# Patient Record
Sex: Female | Born: 1935 | Race: White | Hispanic: No | Marital: Married | Smoking: Never smoker
Health system: Southern US, Community
[De-identification: ages and names within clinical notes are randomized; demographics above are authoritative.]

## PROBLEM LIST (undated history)

## (undated) DIAGNOSIS — M1990 Unspecified osteoarthritis, unspecified site: Secondary | ICD-10-CM

## (undated) DIAGNOSIS — M4800 Spinal stenosis, site unspecified: Secondary | ICD-10-CM

## (undated) DIAGNOSIS — Z860100 Personal history of colon polyps, unspecified: Secondary | ICD-10-CM

## (undated) DIAGNOSIS — E079 Disorder of thyroid, unspecified: Secondary | ICD-10-CM

## (undated) DIAGNOSIS — E785 Hyperlipidemia, unspecified: Secondary | ICD-10-CM

## (undated) DIAGNOSIS — D649 Anemia, unspecified: Secondary | ICD-10-CM

## (undated) DIAGNOSIS — I5032 Chronic diastolic (congestive) heart failure: Secondary | ICD-10-CM

## (undated) DIAGNOSIS — C9001 Multiple myeloma in remission: Secondary | ICD-10-CM

## (undated) DIAGNOSIS — C8800 Waldenstrom macroglobulinemia not having achieved remission: Secondary | ICD-10-CM

## (undated) DIAGNOSIS — K5790 Diverticulosis of intestine, part unspecified, without perforation or abscess without bleeding: Secondary | ICD-10-CM

## (undated) DIAGNOSIS — I10 Essential (primary) hypertension: Secondary | ICD-10-CM

## (undated) DIAGNOSIS — O24919 Unspecified diabetes mellitus in pregnancy, unspecified trimester: Secondary | ICD-10-CM

## (undated) DIAGNOSIS — I509 Heart failure, unspecified: Secondary | ICD-10-CM

## (undated) DIAGNOSIS — K449 Diaphragmatic hernia without obstruction or gangrene: Secondary | ICD-10-CM

## (undated) DIAGNOSIS — K219 Gastro-esophageal reflux disease without esophagitis: Secondary | ICD-10-CM

## (undated) DIAGNOSIS — K760 Fatty (change of) liver, not elsewhere classified: Secondary | ICD-10-CM

## (undated) HISTORY — DX: Fatty (change of) liver, not elsewhere classified: K76.0

## (undated) HISTORY — DX: Unspecified diabetes mellitus in pregnancy, unspecified trimester: O24.919

## (undated) HISTORY — PX: CATARACT EXTRACTION: SUR2

## (undated) HISTORY — DX: Essential (primary) hypertension: I10

## (undated) HISTORY — PX: APPENDECTOMY: SHX54

## (undated) HISTORY — PX: LUMBAR FUSION: SHX111

## (undated) HISTORY — DX: Gastro-esophageal reflux disease without esophagitis: K21.9

## (undated) HISTORY — DX: Waldenstrom macroglobulinemia not having achieved remission: C88.00

## (undated) HISTORY — DX: Disorder of thyroid, unspecified: E07.9

## (undated) HISTORY — DX: Unspecified osteoarthritis, unspecified site: M19.90

## (undated) HISTORY — PX: TOTAL ABDOMINAL HYSTERECTOMY: SHX209

## (undated) HISTORY — PX: KYPHOSIS SURGERY: SHX114

## (undated) HISTORY — DX: Anemia, unspecified: D64.9

## (undated) HISTORY — DX: Personal history of colon polyps, unspecified: Z86.0100

## (undated) HISTORY — DX: Heart failure, unspecified: I50.9

## (undated) HISTORY — DX: Multiple myeloma in remission: C90.01

## (undated) HISTORY — DX: Personal history of colonic polyps: Z86.010

## (undated) HISTORY — DX: Hyperlipidemia, unspecified: E78.5

## (undated) HISTORY — DX: Waldenstrom macroglobulinemia: C88.0

## (undated) HISTORY — PX: COLONOSCOPY W/ POLYPECTOMY: SHX1380

## (undated) HISTORY — DX: Diverticulosis of intestine, part unspecified, without perforation or abscess without bleeding: K57.90

## (undated) HISTORY — PX: TOTAL KNEE ARTHROPLASTY: SHX125

## (undated) HISTORY — DX: Diaphragmatic hernia without obstruction or gangrene: K44.9

## (undated) HISTORY — DX: Spinal stenosis, site unspecified: M48.00

---

## 1991-07-07 ENCOUNTER — Encounter: Payer: Self-pay | Admitting: Gastroenterology

## 1998-09-14 ENCOUNTER — Other Ambulatory Visit: Admission: RE | Admit: 1998-09-14 | Discharge: 1998-09-14 | Payer: Self-pay | Admitting: *Deleted

## 1998-12-15 ENCOUNTER — Ambulatory Visit (HOSPITAL_COMMUNITY): Admission: RE | Admit: 1998-12-15 | Discharge: 1998-12-15 | Payer: Self-pay | Admitting: Orthopedic Surgery

## 1998-12-15 ENCOUNTER — Encounter: Payer: Self-pay | Admitting: Orthopedic Surgery

## 1999-01-03 ENCOUNTER — Encounter: Admission: RE | Admit: 1999-01-03 | Discharge: 1999-04-03 | Payer: Self-pay | Admitting: Anesthesiology

## 1999-05-12 ENCOUNTER — Encounter: Payer: Self-pay | Admitting: Orthopedic Surgery

## 1999-05-20 ENCOUNTER — Inpatient Hospital Stay (HOSPITAL_COMMUNITY): Admission: RE | Admit: 1999-05-20 | Discharge: 1999-05-23 | Payer: Self-pay | Admitting: Orthopedic Surgery

## 1999-05-20 ENCOUNTER — Encounter: Payer: Self-pay | Admitting: Orthopedic Surgery

## 1999-09-22 ENCOUNTER — Other Ambulatory Visit: Admission: RE | Admit: 1999-09-22 | Discharge: 1999-09-22 | Payer: Self-pay | Admitting: *Deleted

## 1999-10-24 ENCOUNTER — Encounter: Admission: RE | Admit: 1999-10-24 | Discharge: 2000-01-22 | Payer: Self-pay | Admitting: *Deleted

## 2000-03-22 ENCOUNTER — Encounter: Admission: RE | Admit: 2000-03-22 | Discharge: 2000-06-20 | Payer: Self-pay | Admitting: *Deleted

## 2000-07-19 ENCOUNTER — Encounter: Admission: RE | Admit: 2000-07-19 | Discharge: 2000-08-14 | Payer: Self-pay | Admitting: Gastroenterology

## 2000-07-25 ENCOUNTER — Encounter: Payer: Self-pay | Admitting: *Deleted

## 2000-07-25 ENCOUNTER — Inpatient Hospital Stay (HOSPITAL_COMMUNITY): Admission: AD | Admit: 2000-07-25 | Discharge: 2000-07-26 | Payer: Self-pay | Admitting: *Deleted

## 2000-09-19 ENCOUNTER — Other Ambulatory Visit: Admission: RE | Admit: 2000-09-19 | Discharge: 2000-09-19 | Payer: Self-pay | Admitting: *Deleted

## 2000-11-15 ENCOUNTER — Encounter: Payer: Self-pay | Admitting: Internal Medicine

## 2000-11-15 ENCOUNTER — Inpatient Hospital Stay (HOSPITAL_COMMUNITY): Admission: EM | Admit: 2000-11-15 | Discharge: 2000-11-21 | Payer: Self-pay | Admitting: Internal Medicine

## 2000-11-16 ENCOUNTER — Encounter: Payer: Self-pay | Admitting: Internal Medicine

## 2000-11-17 ENCOUNTER — Encounter: Payer: Self-pay | Admitting: Internal Medicine

## 2001-04-15 ENCOUNTER — Encounter: Payer: Self-pay | Admitting: Orthopedic Surgery

## 2001-04-18 ENCOUNTER — Inpatient Hospital Stay (HOSPITAL_COMMUNITY): Admission: RE | Admit: 2001-04-18 | Discharge: 2001-04-19 | Payer: Self-pay | Admitting: Orthopedic Surgery

## 2002-06-17 ENCOUNTER — Other Ambulatory Visit: Admission: RE | Admit: 2002-06-17 | Discharge: 2002-06-17 | Payer: Self-pay | Admitting: Obstetrics and Gynecology

## 2002-08-04 ENCOUNTER — Encounter: Payer: Self-pay | Admitting: Gastroenterology

## 2002-08-04 ENCOUNTER — Ambulatory Visit (HOSPITAL_COMMUNITY): Admission: RE | Admit: 2002-08-04 | Discharge: 2002-08-04 | Payer: Self-pay | Admitting: Gastroenterology

## 2002-12-11 ENCOUNTER — Ambulatory Visit (HOSPITAL_COMMUNITY): Admission: RE | Admit: 2002-12-11 | Discharge: 2002-12-11 | Payer: Self-pay | Admitting: Obstetrics and Gynecology

## 2002-12-11 ENCOUNTER — Encounter: Payer: Self-pay | Admitting: Obstetrics and Gynecology

## 2003-05-22 ENCOUNTER — Encounter: Payer: Self-pay | Admitting: Gastroenterology

## 2003-05-22 ENCOUNTER — Ambulatory Visit (HOSPITAL_COMMUNITY): Admission: RE | Admit: 2003-05-22 | Discharge: 2003-05-22 | Payer: Self-pay | Admitting: Gastroenterology

## 2003-07-09 ENCOUNTER — Other Ambulatory Visit: Admission: RE | Admit: 2003-07-09 | Discharge: 2003-07-09 | Payer: Self-pay | Admitting: Obstetrics and Gynecology

## 2003-07-22 ENCOUNTER — Other Ambulatory Visit: Admission: RE | Admit: 2003-07-22 | Discharge: 2003-07-22 | Payer: Self-pay | Admitting: Oncology

## 2003-07-22 ENCOUNTER — Encounter (INDEPENDENT_AMBULATORY_CARE_PROVIDER_SITE_OTHER): Payer: Self-pay | Admitting: *Deleted

## 2003-09-24 ENCOUNTER — Ambulatory Visit (HOSPITAL_COMMUNITY): Admission: RE | Admit: 2003-09-24 | Discharge: 2003-09-24 | Payer: Self-pay | Admitting: Oncology

## 2003-11-25 ENCOUNTER — Encounter: Admission: RE | Admit: 2003-11-25 | Discharge: 2003-11-25 | Payer: Self-pay | Admitting: Gastroenterology

## 2004-01-08 ENCOUNTER — Encounter
Admission: RE | Admit: 2004-01-08 | Discharge: 2004-01-08 | Payer: Self-pay | Admitting: Physical Medicine and Rehabilitation

## 2004-03-08 ENCOUNTER — Encounter: Admission: RE | Admit: 2004-03-08 | Discharge: 2004-03-08 | Payer: Self-pay | Admitting: Obstetrics and Gynecology

## 2004-06-09 ENCOUNTER — Inpatient Hospital Stay (HOSPITAL_COMMUNITY): Admission: RE | Admit: 2004-06-09 | Discharge: 2004-06-13 | Payer: Self-pay | Admitting: Orthopaedic Surgery

## 2004-06-29 ENCOUNTER — Encounter: Admission: RE | Admit: 2004-06-29 | Discharge: 2004-06-29 | Payer: Self-pay | Admitting: Orthopaedic Surgery

## 2004-10-20 ENCOUNTER — Ambulatory Visit: Payer: Self-pay | Admitting: Oncology

## 2004-11-02 ENCOUNTER — Ambulatory Visit: Payer: Self-pay | Admitting: Internal Medicine

## 2005-02-24 ENCOUNTER — Other Ambulatory Visit: Admission: RE | Admit: 2005-02-24 | Discharge: 2005-02-24 | Payer: Self-pay | Admitting: Obstetrics and Gynecology

## 2005-03-23 ENCOUNTER — Ambulatory Visit: Payer: Self-pay | Admitting: Internal Medicine

## 2005-03-24 ENCOUNTER — Ambulatory Visit: Payer: Self-pay | Admitting: Internal Medicine

## 2005-03-31 ENCOUNTER — Ambulatory Visit: Payer: Self-pay

## 2005-04-18 ENCOUNTER — Ambulatory Visit: Payer: Self-pay | Admitting: Oncology

## 2005-04-20 ENCOUNTER — Ambulatory Visit: Payer: Self-pay | Admitting: Internal Medicine

## 2005-05-02 ENCOUNTER — Ambulatory Visit (HOSPITAL_COMMUNITY): Admission: RE | Admit: 2005-05-02 | Discharge: 2005-05-02 | Payer: Self-pay | Admitting: Oncology

## 2005-05-02 ENCOUNTER — Ambulatory Visit: Payer: Self-pay | Admitting: Oncology

## 2005-05-02 ENCOUNTER — Encounter (INDEPENDENT_AMBULATORY_CARE_PROVIDER_SITE_OTHER): Payer: Self-pay | Admitting: *Deleted

## 2005-05-05 ENCOUNTER — Ambulatory Visit: Payer: Self-pay | Admitting: Internal Medicine

## 2005-06-14 ENCOUNTER — Ambulatory Visit: Payer: Self-pay | Admitting: Oncology

## 2005-06-22 ENCOUNTER — Ambulatory Visit: Payer: Self-pay | Admitting: Internal Medicine

## 2005-06-30 ENCOUNTER — Ambulatory Visit: Payer: Self-pay | Admitting: Cardiology

## 2005-06-30 ENCOUNTER — Ambulatory Visit: Payer: Self-pay | Admitting: Family Medicine

## 2005-07-03 ENCOUNTER — Ambulatory Visit: Payer: Self-pay | Admitting: Family Medicine

## 2005-07-03 ENCOUNTER — Encounter: Admission: RE | Admit: 2005-07-03 | Discharge: 2005-07-03 | Payer: Self-pay | Admitting: Family Medicine

## 2005-07-11 ENCOUNTER — Ambulatory Visit (HOSPITAL_COMMUNITY): Admission: RE | Admit: 2005-07-11 | Discharge: 2005-07-11 | Payer: Self-pay | Admitting: Rheumatology

## 2005-07-17 ENCOUNTER — Ambulatory Visit: Payer: Self-pay | Admitting: Internal Medicine

## 2005-07-18 ENCOUNTER — Ambulatory Visit (HOSPITAL_BASED_OUTPATIENT_CLINIC_OR_DEPARTMENT_OTHER): Admission: RE | Admit: 2005-07-18 | Discharge: 2005-07-18 | Payer: Self-pay | Admitting: Internal Medicine

## 2005-07-27 ENCOUNTER — Ambulatory Visit: Payer: Self-pay | Admitting: Pulmonary Disease

## 2005-08-04 ENCOUNTER — Ambulatory Visit: Payer: Self-pay | Admitting: Family Medicine

## 2005-08-09 ENCOUNTER — Encounter: Admission: RE | Admit: 2005-08-09 | Discharge: 2005-08-09 | Payer: Self-pay | Admitting: Orthopaedic Surgery

## 2005-08-10 ENCOUNTER — Ambulatory Visit: Payer: Self-pay | Admitting: Pulmonary Disease

## 2005-08-11 ENCOUNTER — Ambulatory Visit: Payer: Self-pay | Admitting: Oncology

## 2005-08-16 ENCOUNTER — Ambulatory Visit (HOSPITAL_COMMUNITY): Admission: RE | Admit: 2005-08-16 | Discharge: 2005-08-17 | Payer: Self-pay | Admitting: Orthopaedic Surgery

## 2005-08-16 ENCOUNTER — Encounter (INDEPENDENT_AMBULATORY_CARE_PROVIDER_SITE_OTHER): Payer: Self-pay | Admitting: Specialist

## 2005-08-24 ENCOUNTER — Ambulatory Visit: Payer: Self-pay | Admitting: Internal Medicine

## 2005-09-11 ENCOUNTER — Encounter: Admission: RE | Admit: 2005-09-11 | Discharge: 2005-09-11 | Payer: Self-pay | Admitting: Oncology

## 2005-10-12 ENCOUNTER — Ambulatory Visit: Payer: Self-pay | Admitting: Oncology

## 2005-10-17 ENCOUNTER — Ambulatory Visit: Payer: Self-pay | Admitting: Internal Medicine

## 2005-11-08 ENCOUNTER — Ambulatory Visit: Payer: Self-pay | Admitting: Internal Medicine

## 2005-11-10 ENCOUNTER — Ambulatory Visit: Payer: Self-pay | Admitting: Cardiology

## 2005-11-10 ENCOUNTER — Inpatient Hospital Stay (HOSPITAL_COMMUNITY): Admission: EM | Admit: 2005-11-10 | Discharge: 2005-11-15 | Payer: Self-pay | Admitting: Emergency Medicine

## 2005-11-20 ENCOUNTER — Ambulatory Visit: Payer: Self-pay

## 2005-11-27 ENCOUNTER — Ambulatory Visit: Payer: Self-pay | Admitting: Cardiology

## 2005-11-28 ENCOUNTER — Ambulatory Visit: Payer: Self-pay | Admitting: Internal Medicine

## 2005-11-29 ENCOUNTER — Ambulatory Visit: Payer: Self-pay | Admitting: Internal Medicine

## 2005-11-30 ENCOUNTER — Ambulatory Visit: Payer: Self-pay | Admitting: Pulmonary Disease

## 2005-12-01 ENCOUNTER — Ambulatory Visit: Payer: Self-pay | Admitting: Oncology

## 2005-12-06 ENCOUNTER — Ambulatory Visit (HOSPITAL_COMMUNITY): Admission: RE | Admit: 2005-12-06 | Discharge: 2005-12-06 | Payer: Self-pay | Admitting: Pulmonary Disease

## 2005-12-06 ENCOUNTER — Ambulatory Visit: Payer: Self-pay | Admitting: Pulmonary Disease

## 2005-12-12 ENCOUNTER — Ambulatory Visit: Payer: Self-pay | Admitting: Pulmonary Disease

## 2005-12-26 ENCOUNTER — Encounter: Admission: RE | Admit: 2005-12-26 | Discharge: 2006-03-26 | Payer: Self-pay | Admitting: Internal Medicine

## 2006-01-09 ENCOUNTER — Ambulatory Visit: Payer: Self-pay | Admitting: Internal Medicine

## 2006-01-15 ENCOUNTER — Ambulatory Visit: Payer: Self-pay | Admitting: Pulmonary Disease

## 2006-01-23 ENCOUNTER — Ambulatory Visit: Payer: Self-pay | Admitting: Cardiology

## 2006-01-29 ENCOUNTER — Ambulatory Visit: Payer: Self-pay | Admitting: Oncology

## 2006-01-29 LAB — PROTIME-INR: INR: 1 — ABNORMAL LOW (ref 2.00–3.50)

## 2006-01-29 LAB — CBC WITH DIFFERENTIAL/PLATELET
BASO%: 0.2 % (ref 0.0–2.0)
EOS%: 6.7 % (ref 0.0–7.0)
MCHC: 34.5 g/dL (ref 32.0–36.0)
MONO#: 0.4 10*3/uL (ref 0.1–0.9)
RBC: 3.1 10*6/uL — ABNORMAL LOW (ref 3.70–5.32)
WBC: 5.2 10*3/uL (ref 3.9–10.0)
lymph#: 2.1 10*3/uL (ref 0.9–3.3)

## 2006-02-01 LAB — COMPREHENSIVE METABOLIC PANEL
ALT: 12 U/L (ref 0–40)
AST: 18 U/L (ref 0–37)
CO2: 29 mEq/L (ref 19–32)
Calcium: 10 mg/dL (ref 8.4–10.5)
Chloride: 99 mEq/L (ref 96–112)
Sodium: 141 mEq/L (ref 135–145)
Total Bilirubin: 0.4 mg/dL (ref 0.3–1.2)
Total Protein: 8.3 g/dL (ref 6.0–8.3)

## 2006-02-01 LAB — PROTEIN ELECTROPHORESIS, SERUM
Beta Globulin: 4.8 % (ref 4.7–7.2)
Gamma Globulin: 27.6 % — ABNORMAL HIGH (ref 11.1–18.8)
M-Spike, %: 1.77 g/dL
Total Protein, Serum Electrophoresis: 8.3 g/dL (ref 6.0–8.3)

## 2006-02-01 LAB — KAPPA/LAMBDA LIGHT CHAINS: Lambda Free Lght Chn: 1.07 mg/dL (ref 0.57–2.63)

## 2006-02-09 ENCOUNTER — Ambulatory Visit: Payer: Self-pay | Admitting: Internal Medicine

## 2006-02-09 ENCOUNTER — Encounter: Admission: RE | Admit: 2006-02-09 | Discharge: 2006-02-09 | Payer: Self-pay | Admitting: Orthopedic Surgery

## 2006-02-23 ENCOUNTER — Inpatient Hospital Stay (HOSPITAL_COMMUNITY): Admission: RE | Admit: 2006-02-23 | Discharge: 2006-02-26 | Payer: Self-pay | Admitting: Orthopedic Surgery

## 2006-03-06 LAB — CBC WITH DIFFERENTIAL/PLATELET
Basophils Absolute: 0 10*3/uL (ref 0.0–0.1)
EOS%: 4.7 % (ref 0.0–7.0)
Eosinophils Absolute: 0.3 10*3/uL (ref 0.0–0.5)
HGB: 11.1 g/dL — ABNORMAL LOW (ref 11.6–15.9)
NEUT#: 3.2 10*3/uL (ref 1.5–6.5)
RBC: 3.43 10*6/uL — ABNORMAL LOW (ref 3.70–5.32)
RDW: 13.5 % (ref 11.3–14.5)
lymph#: 2.9 10*3/uL (ref 0.9–3.3)

## 2006-03-08 LAB — COMPREHENSIVE METABOLIC PANEL
AST: 36 U/L (ref 0–37)
Albumin: 4.1 g/dL (ref 3.5–5.2)
BUN: 24 mg/dL — ABNORMAL HIGH (ref 6–23)
Calcium: 9.6 mg/dL (ref 8.4–10.5)
Chloride: 96 mEq/L (ref 96–112)
Glucose, Bld: 75 mg/dL (ref 70–99)
Potassium: 4.4 mEq/L (ref 3.5–5.3)
Sodium: 141 mEq/L (ref 135–145)
Total Protein: 8.8 g/dL — ABNORMAL HIGH (ref 6.0–8.3)

## 2006-03-08 LAB — PROTEIN ELECTROPHORESIS, SERUM
Gamma Globulin: 28.5 % — ABNORMAL HIGH (ref 11.1–18.8)
M-Spike, %: 1.86 g/dL

## 2006-03-09 ENCOUNTER — Ambulatory Visit: Payer: Self-pay | Admitting: Internal Medicine

## 2006-03-16 ENCOUNTER — Ambulatory Visit: Payer: Self-pay | Admitting: Oncology

## 2006-03-20 LAB — CBC WITH DIFFERENTIAL/PLATELET
Eosinophils Absolute: 0.3 10*3/uL (ref 0.0–0.5)
LYMPH%: 42 % (ref 14.0–48.0)
MONO#: 0.6 10*3/uL (ref 0.1–0.9)
NEUT#: 2.2 10*3/uL (ref 1.5–6.5)
Platelets: 221 10*3/uL (ref 145–400)
RBC: 3.21 10*6/uL — ABNORMAL LOW (ref 3.70–5.32)
RDW: 14 % (ref 11.3–14.5)
WBC: 5.3 10*3/uL (ref 3.9–10.0)

## 2006-03-21 ENCOUNTER — Ambulatory Visit: Payer: Self-pay | Admitting: Internal Medicine

## 2006-03-21 LAB — PROTEIN ELECTROPHORESIS, SERUM
Albumin ELP: 48.3 % — ABNORMAL LOW (ref 55.8–66.1)
Total Protein, Serum Electrophoresis: 8.4 g/dL — ABNORMAL HIGH (ref 6.0–8.3)

## 2006-03-21 LAB — COMPREHENSIVE METABOLIC PANEL
Albumin: 4.1 g/dL (ref 3.5–5.2)
CO2: 31 mEq/L (ref 19–32)
Calcium: 9.8 mg/dL (ref 8.4–10.5)
Chloride: 98 mEq/L (ref 96–112)
Glucose, Bld: 111 mg/dL — ABNORMAL HIGH (ref 70–99)
Potassium: 4 mEq/L (ref 3.5–5.3)
Sodium: 140 mEq/L (ref 135–145)
Total Protein: 8.4 g/dL — ABNORMAL HIGH (ref 6.0–8.3)

## 2006-03-26 ENCOUNTER — Ambulatory Visit: Payer: Self-pay | Admitting: Internal Medicine

## 2006-04-02 ENCOUNTER — Ambulatory Visit: Payer: Self-pay | Admitting: Cardiology

## 2006-04-17 LAB — CBC WITH DIFFERENTIAL/PLATELET
Eosinophils Absolute: 0.2 10*3/uL (ref 0.0–0.5)
MONO#: 0.4 10*3/uL (ref 0.1–0.9)
NEUT#: 2.2 10*3/uL (ref 1.5–6.5)
RBC: 3.27 10*6/uL — ABNORMAL LOW (ref 3.70–5.32)
RDW: 13.5 % (ref 11.3–14.5)
WBC: 5 10*3/uL (ref 3.9–10.0)
lymph#: 2.2 10*3/uL (ref 0.9–3.3)

## 2006-04-29 ENCOUNTER — Ambulatory Visit: Payer: Self-pay | Admitting: Oncology

## 2006-05-11 LAB — CBC WITH DIFFERENTIAL/PLATELET
Eosinophils Absolute: 0.2 10*3/uL (ref 0.0–0.5)
HCT: 32.3 % — ABNORMAL LOW (ref 34.8–46.6)
LYMPH%: 38.1 % (ref 14.0–48.0)
MONO#: 0.4 10*3/uL (ref 0.1–0.9)
NEUT#: 1.9 10*3/uL (ref 1.5–6.5)
NEUT%: 47.3 % (ref 39.6–76.8)
Platelets: 192 10*3/uL (ref 145–400)
WBC: 3.9 10*3/uL (ref 3.9–10.0)

## 2006-05-24 LAB — CBC WITH DIFFERENTIAL/PLATELET
BASO%: 0.9 % (ref 0.0–2.0)
EOS%: 4.4 % (ref 0.0–7.0)
HCT: 32.1 % — ABNORMAL LOW (ref 34.8–46.6)
LYMPH%: 46.7 % (ref 14.0–48.0)
MCH: 32.7 pg (ref 26.0–34.0)
MCHC: 34.5 g/dL (ref 32.0–36.0)
NEUT%: 38.6 % — ABNORMAL LOW (ref 39.6–76.8)
Platelets: 191 10*3/uL (ref 145–400)

## 2006-05-29 ENCOUNTER — Ambulatory Visit: Payer: Self-pay | Admitting: Internal Medicine

## 2006-06-05 ENCOUNTER — Ambulatory Visit: Payer: Self-pay | Admitting: Internal Medicine

## 2006-06-07 LAB — CBC WITH DIFFERENTIAL/PLATELET
BASO%: 0.1 % (ref 0.0–2.0)
EOS%: 4.3 % (ref 0.0–7.0)
HCT: 29.6 % — ABNORMAL LOW (ref 34.8–46.6)
MCH: 33.3 pg (ref 26.0–34.0)
MCHC: 35 g/dL (ref 32.0–36.0)
MONO%: 10.7 % (ref 0.0–13.0)
NEUT%: 51.9 % (ref 39.6–76.8)
RDW: 13.1 % (ref 11.3–14.5)
lymph#: 1.7 10*3/uL (ref 0.9–3.3)

## 2006-06-08 ENCOUNTER — Ambulatory Visit: Payer: Self-pay | Admitting: Family Medicine

## 2006-06-12 LAB — COMPREHENSIVE METABOLIC PANEL
ALT: 15 U/L (ref 0–40)
AST: 17 U/L (ref 0–37)
Alkaline Phosphatase: 45 U/L (ref 39–117)
Creatinine, Ser: 0.99 mg/dL (ref 0.40–1.20)
Sodium: 143 mEq/L (ref 135–145)
Total Bilirubin: 0.4 mg/dL (ref 0.3–1.2)
Total Protein: 8.2 g/dL (ref 6.0–8.3)

## 2006-06-12 LAB — PROTEIN ELECTROPHORESIS, SERUM
Alpha-2-Globulin: 10.6 % (ref 7.1–11.8)
Beta 2: 3.4 % (ref 3.2–6.5)
Beta Globulin: 4.8 % (ref 4.7–7.2)
Gamma Globulin: 26.3 % — ABNORMAL HIGH (ref 11.1–18.8)
M-Spike, %: 1.47 g/dL
Total Protein, Serum Electrophoresis: 8.2 g/dL (ref 6.0–8.3)

## 2006-06-14 ENCOUNTER — Ambulatory Visit: Payer: Self-pay | Admitting: Family Medicine

## 2006-06-27 ENCOUNTER — Ambulatory Visit: Payer: Self-pay | Admitting: Internal Medicine

## 2006-06-29 ENCOUNTER — Ambulatory Visit: Payer: Self-pay | Admitting: Oncology

## 2006-07-03 LAB — CBC WITH DIFFERENTIAL/PLATELET
EOS%: 0.8 % (ref 0.0–7.0)
Eosinophils Absolute: 0 10*3/uL (ref 0.0–0.5)
LYMPH%: 32.8 % (ref 14.0–48.0)
MCH: 33.5 pg (ref 26.0–34.0)
MCV: 96.1 fL (ref 81.0–101.0)
MONO%: 7.5 % (ref 0.0–13.0)
NEUT#: 3.2 10*3/uL (ref 1.5–6.5)
Platelets: 174 10*3/uL (ref 145–400)
RBC: 3.07 10*6/uL — ABNORMAL LOW (ref 3.70–5.32)

## 2006-07-06 LAB — PROTEIN ELECTROPHORESIS, SERUM
Albumin ELP: 51 % — ABNORMAL LOW (ref 55.8–66.1)
M-Spike, %: 1.64 g/dL
Total Protein, Serum Electrophoresis: 8.3 g/dL (ref 6.0–8.3)

## 2006-07-06 LAB — BASIC METABOLIC PANEL
BUN: 32 mg/dL — ABNORMAL HIGH (ref 6–23)
Calcium: 10 mg/dL (ref 8.4–10.5)
Glucose, Bld: 122 mg/dL — ABNORMAL HIGH (ref 70–99)
Sodium: 144 mEq/L (ref 135–145)

## 2006-07-25 ENCOUNTER — Ambulatory Visit: Payer: Self-pay | Admitting: Internal Medicine

## 2006-07-25 LAB — CONVERTED CEMR LAB: Creatinine,U: 27.8 mg/dL

## 2006-08-01 ENCOUNTER — Ambulatory Visit: Payer: Self-pay | Admitting: Internal Medicine

## 2006-08-21 ENCOUNTER — Ambulatory Visit: Payer: Self-pay | Admitting: Oncology

## 2006-08-23 LAB — CBC WITH DIFFERENTIAL/PLATELET
Basophils Absolute: 0 10*3/uL (ref 0.0–0.1)
Eosinophils Absolute: 0.1 10*3/uL (ref 0.0–0.5)
HGB: 9.6 g/dL — ABNORMAL LOW (ref 11.6–15.9)
MCV: 97.1 fL (ref 81.0–101.0)
MONO#: 0.5 10*3/uL (ref 0.1–0.9)
MONO%: 10.4 % (ref 0.0–13.0)
NEUT#: 2.2 10*3/uL (ref 1.5–6.5)
Platelets: 176 10*3/uL (ref 145–400)
RDW: 12.7 % (ref 11.3–14.5)
WBC: 4.5 10*3/uL (ref 3.9–10.0)

## 2006-08-24 LAB — IGG, IGA, IGM
IgA: 34 mg/dL — ABNORMAL LOW (ref 68–378)
IgM, Serum: 2650 mg/dL — ABNORMAL HIGH (ref 60–263)

## 2006-08-24 LAB — PROTEIN ELECTROPHORESIS, SERUM
Albumin ELP: 48.9 % — ABNORMAL LOW (ref 55.8–66.1)
Alpha-2-Globulin: 11.9 % — ABNORMAL HIGH (ref 7.1–11.8)
Beta 2: 3.9 % (ref 3.2–6.5)
Beta Globulin: 4.7 % (ref 4.7–7.2)
Total Protein, Serum Electrophoresis: 8.5 g/dL — ABNORMAL HIGH (ref 6.0–8.3)

## 2006-09-07 ENCOUNTER — Inpatient Hospital Stay (HOSPITAL_COMMUNITY): Admission: RE | Admit: 2006-09-07 | Discharge: 2006-09-09 | Payer: Self-pay | Admitting: Orthopedic Surgery

## 2006-09-27 LAB — CBC WITH DIFFERENTIAL/PLATELET
Basophils Absolute: 0 10*3/uL (ref 0.0–0.1)
Eosinophils Absolute: 0.1 10*3/uL (ref 0.0–0.5)
HGB: 9.3 g/dL — ABNORMAL LOW (ref 11.6–15.9)
NEUT#: 3.5 10*3/uL (ref 1.5–6.5)
RBC: 2.79 10*6/uL — ABNORMAL LOW (ref 3.70–5.32)
RDW: 13 % (ref 11.3–14.5)
WBC: 5.9 10*3/uL (ref 3.9–10.0)
lymph#: 1.6 10*3/uL (ref 0.9–3.3)

## 2006-10-15 ENCOUNTER — Ambulatory Visit: Payer: Self-pay | Admitting: Oncology

## 2006-10-18 LAB — CBC WITH DIFFERENTIAL/PLATELET
BASO%: 0.3 % (ref 0.0–2.0)
Basophils Absolute: 0 10*3/uL (ref 0.0–0.1)
EOS%: 1 % (ref 0.0–7.0)
HGB: 9.9 g/dL — ABNORMAL LOW (ref 11.6–15.9)
MCH: 33.5 pg (ref 26.0–34.0)
RBC: 2.95 10*6/uL — ABNORMAL LOW (ref 3.70–5.32)
RDW: 12.9 % (ref 11.3–14.5)
lymph#: 2 10*3/uL (ref 0.9–3.3)

## 2006-10-18 LAB — COMPREHENSIVE METABOLIC PANEL
ALT: 14 U/L (ref 0–35)
AST: 14 U/L (ref 0–37)
Albumin: 4.1 g/dL (ref 3.5–5.2)
Alkaline Phosphatase: 44 U/L (ref 39–117)
Calcium: 9.8 mg/dL (ref 8.4–10.5)
Chloride: 100 mEq/L (ref 96–112)
Potassium: 4 mEq/L (ref 3.5–5.3)
Sodium: 144 mEq/L (ref 135–145)
Total Protein: 8.3 g/dL (ref 6.0–8.3)

## 2006-10-22 LAB — UIFE/LIGHT CHAINS/TP QN, 24-HR UR
Albumin, U: DETECTED
Free Kappa Lt Chains,Ur: 1.41 mg/dL (ref 0.04–1.51)
Gamma Globulin, Urine: DETECTED — AB

## 2006-10-22 LAB — CREATININE CLEARANCE, URINE, 24 HOUR
Collection Interval-CRCL: 24 hours
Creatinine Clearance: 67 mL/min — ABNORMAL LOW (ref 75–115)
Creatinine, 24H Ur: 1041 mg/d (ref 700–1800)
Creatinine: 1.08 mg/dL (ref 0.40–1.20)
Urine Total Volume-CRCL: 1300 mL

## 2006-10-29 DIAGNOSIS — J45909 Unspecified asthma, uncomplicated: Secondary | ICD-10-CM | POA: Insufficient documentation

## 2006-10-29 DIAGNOSIS — M4800 Spinal stenosis, site unspecified: Secondary | ICD-10-CM

## 2006-10-29 DIAGNOSIS — M810 Age-related osteoporosis without current pathological fracture: Secondary | ICD-10-CM | POA: Insufficient documentation

## 2006-10-29 DIAGNOSIS — C9001 Multiple myeloma in remission: Secondary | ICD-10-CM

## 2006-10-29 DIAGNOSIS — D649 Anemia, unspecified: Secondary | ICD-10-CM | POA: Insufficient documentation

## 2006-10-29 DIAGNOSIS — Z860100 Personal history of colon polyps, unspecified: Secondary | ICD-10-CM | POA: Insufficient documentation

## 2006-11-14 ENCOUNTER — Ambulatory Visit: Payer: Self-pay | Admitting: Internal Medicine

## 2006-11-14 LAB — CONVERTED CEMR LAB
HDL: 51 mg/dL (ref >39.0)
LDL Cholesterol: 95 mg/dL (ref 0–99)
Triglycerides: 184 mg/dL — ABNORMAL HIGH (ref 0–149)
VLDL: 37 mg/dL (ref 0–40)

## 2006-11-21 ENCOUNTER — Ambulatory Visit: Payer: Self-pay | Admitting: Internal Medicine

## 2006-11-30 ENCOUNTER — Ambulatory Visit: Payer: Self-pay | Admitting: Pulmonary Disease

## 2006-11-30 LAB — CONVERTED CEMR LAB
Eosinophils Absolute: 0.2 10*3/uL (ref 0.0–0.6)
Eosinophils Relative: 2.8 % (ref 0.0–5.0)
Lymphocytes Relative: 40.5 % (ref 12.0–46.0)
MCV: 95 fL (ref 78.0–100.0)
Monocytes Relative: 9.6 % (ref 3.0–11.0)
Neutro Abs: 2.7 10*3/uL (ref 1.4–7.7)
Platelets: 186 10*3/uL (ref 150–400)
WBC: 5.7 10*3/uL (ref 4.5–10.5)

## 2006-12-03 ENCOUNTER — Ambulatory Visit: Payer: Self-pay | Admitting: Oncology

## 2006-12-06 LAB — CBC WITH DIFFERENTIAL/PLATELET
Basophils Absolute: 0 10*3/uL (ref 0.0–0.1)
Eosinophils Absolute: 0.1 10*3/uL (ref 0.0–0.5)
HGB: 9.7 g/dL — ABNORMAL LOW (ref 11.6–15.9)
LYMPH%: 41.4 % (ref 14.0–48.0)
MCV: 94.5 fL (ref 81.0–101.0)
MONO#: 0.5 10*3/uL (ref 0.1–0.9)
MONO%: 9.2 % (ref 0.0–13.0)
NEUT#: 2.6 10*3/uL (ref 1.5–6.5)
Platelets: 176 10*3/uL (ref 145–400)
RDW: 13.2 % (ref 11.3–14.5)
WBC: 5.6 10*3/uL (ref 3.9–10.0)

## 2006-12-10 LAB — COMPREHENSIVE METABOLIC PANEL
Albumin: 4.1 g/dL (ref 3.5–5.2)
Alkaline Phosphatase: 43 U/L (ref 39–117)
BUN: 36 mg/dL — ABNORMAL HIGH (ref 6–23)
CO2: 28 mEq/L (ref 19–32)
Calcium: 10 mg/dL (ref 8.4–10.5)
Glucose, Bld: 136 mg/dL — ABNORMAL HIGH (ref 70–99)
Potassium: 3.6 mEq/L (ref 3.5–5.3)
Total Protein: 8.7 g/dL — ABNORMAL HIGH (ref 6.0–8.3)

## 2006-12-10 LAB — PROTEIN ELECTROPHORESIS, SERUM
Albumin ELP: 46.5 % — ABNORMAL LOW (ref 55.8–66.1)
Alpha-1-Globulin: 3.6 % (ref 2.9–4.9)
Beta 2: 3.8 % (ref 3.2–6.5)
Total Protein, Serum Electrophoresis: 8.7 g/dL — ABNORMAL HIGH (ref 6.0–8.3)

## 2006-12-11 ENCOUNTER — Ambulatory Visit: Admission: RE | Admit: 2006-12-11 | Discharge: 2006-12-11 | Payer: Self-pay | Admitting: Pulmonary Disease

## 2006-12-11 ENCOUNTER — Ambulatory Visit: Payer: Self-pay | Admitting: Pulmonary Disease

## 2006-12-20 ENCOUNTER — Ambulatory Visit: Payer: Self-pay | Admitting: Pulmonary Disease

## 2006-12-20 ENCOUNTER — Ambulatory Visit (HOSPITAL_COMMUNITY): Admission: RE | Admit: 2006-12-20 | Discharge: 2006-12-20 | Payer: Self-pay | Admitting: Oncology

## 2006-12-21 LAB — PROTHROMBIN TIME
INR: 1.1 (ref 0.0–1.5)
Prothrombin Time: 14.1 seconds (ref 11.6–15.2)

## 2006-12-21 LAB — APTT: aPTT: 32 seconds (ref 24–37)

## 2006-12-26 LAB — COMPREHENSIVE METABOLIC PANEL
ALT: 13 U/L (ref 0–35)
AST: 15 U/L (ref 0–37)
Alkaline Phosphatase: 47 U/L (ref 39–117)
Creatinine, Ser: 1.11 mg/dL (ref 0.40–1.20)
Sodium: 140 mEq/L (ref 135–145)
Total Bilirubin: 0.5 mg/dL (ref 0.3–1.2)
Total Protein: 9.2 g/dL — ABNORMAL HIGH (ref 6.0–8.3)

## 2006-12-26 LAB — VISCOSITY, SERUM: Viscosity, Serum: 2 mPa.S (ref 1.1–2.0)

## 2006-12-27 ENCOUNTER — Ambulatory Visit: Payer: Self-pay | Admitting: Family Medicine

## 2007-01-04 ENCOUNTER — Ambulatory Visit: Payer: Self-pay | Admitting: Internal Medicine

## 2007-01-04 LAB — CBC WITH DIFFERENTIAL/PLATELET
BASO%: 0.5 % (ref 0.0–2.0)
HCT: 29.3 % — ABNORMAL LOW (ref 34.8–46.6)
MCHC: 34.7 g/dL (ref 32.0–36.0)
MONO#: 0.9 10*3/uL (ref 0.1–0.9)
RBC: 3.12 10*6/uL — ABNORMAL LOW (ref 3.70–5.32)
WBC: 6.5 10*3/uL (ref 3.9–10.0)
lymph#: 4 10*3/uL — ABNORMAL HIGH (ref 0.9–3.3)

## 2007-01-09 ENCOUNTER — Ambulatory Visit: Payer: Self-pay | Admitting: Pulmonary Disease

## 2007-01-15 ENCOUNTER — Ambulatory Visit: Payer: Self-pay | Admitting: Oncology

## 2007-01-18 LAB — CBC WITH DIFFERENTIAL/PLATELET
Basophils Absolute: 0.1 10*3/uL (ref 0.0–0.1)
HCT: 30.6 % — ABNORMAL LOW (ref 34.8–46.6)
HGB: 10.7 g/dL — ABNORMAL LOW (ref 11.6–15.9)
MONO#: 0.4 10*3/uL (ref 0.1–0.9)
NEUT%: 53.5 % (ref 39.6–76.8)
WBC: 5.7 10*3/uL (ref 3.9–10.0)
lymph#: 2 10*3/uL (ref 0.9–3.3)

## 2007-01-29 ENCOUNTER — Ambulatory Visit: Payer: Self-pay | Admitting: Internal Medicine

## 2007-01-31 ENCOUNTER — Encounter (HOSPITAL_BASED_OUTPATIENT_CLINIC_OR_DEPARTMENT_OTHER): Admission: RE | Admit: 2007-01-31 | Discharge: 2007-05-01 | Payer: Self-pay | Admitting: Surgery

## 2007-02-01 ENCOUNTER — Ambulatory Visit: Admission: RE | Admit: 2007-02-01 | Discharge: 2007-02-01 | Payer: Self-pay | Admitting: Surgery

## 2007-02-01 ENCOUNTER — Encounter: Payer: Self-pay | Admitting: Vascular Surgery

## 2007-02-01 LAB — CBC WITH DIFFERENTIAL/PLATELET
Basophils Absolute: 0 10*3/uL (ref 0.0–0.1)
EOS%: 2.5 % (ref 0.0–7.0)
HCT: 31.1 % — ABNORMAL LOW (ref 34.8–46.6)
HGB: 10.4 g/dL — ABNORMAL LOW (ref 11.6–15.9)
MCH: 31.3 pg (ref 26.0–34.0)
MCV: 93.2 fL (ref 81.0–101.0)
MONO%: 12.5 % (ref 0.0–13.0)
NEUT%: 40.5 % (ref 39.6–76.8)
lymph#: 2.4 10*3/uL (ref 0.9–3.3)

## 2007-02-15 LAB — CBC WITH DIFFERENTIAL/PLATELET
Basophils Absolute: 0 10*3/uL (ref 0.0–0.1)
EOS%: 1.4 % (ref 0.0–7.0)
HGB: 10.7 g/dL — ABNORMAL LOW (ref 11.6–15.9)
LYMPH%: 47.7 % (ref 14.0–48.0)
MCH: 31.5 pg (ref 26.0–34.0)
MCV: 92.3 fL (ref 81.0–101.0)
MONO%: 8.6 % (ref 0.0–13.0)
NEUT%: 42 % (ref 39.6–76.8)
Platelets: 164 10*3/uL (ref 145–400)
RDW: 15.8 % — ABNORMAL HIGH (ref 11.3–14.5)

## 2007-03-01 LAB — CBC WITH DIFFERENTIAL/PLATELET
Basophils Absolute: 0 10*3/uL (ref 0.0–0.1)
Eosinophils Absolute: 0.1 10*3/uL (ref 0.0–0.5)
HGB: 10.2 g/dL — ABNORMAL LOW (ref 11.6–15.9)
MCV: 92 fL (ref 81.0–101.0)
MONO#: 0.5 10*3/uL (ref 0.1–0.9)
NEUT#: 2.3 10*3/uL (ref 1.5–6.5)
RDW: 16.2 % — ABNORMAL HIGH (ref 11.3–14.5)
WBC: 5.1 10*3/uL (ref 3.9–10.0)
lymph#: 2.2 10*3/uL (ref 0.9–3.3)

## 2007-03-13 ENCOUNTER — Ambulatory Visit: Payer: Self-pay | Admitting: Oncology

## 2007-03-15 LAB — CBC WITH DIFFERENTIAL/PLATELET
Basophils Absolute: 0 10*3/uL (ref 0.0–0.1)
Eosinophils Absolute: 0.1 10*3/uL (ref 0.0–0.5)
HCT: 30.2 % — ABNORMAL LOW (ref 34.8–46.6)
HGB: 10.4 g/dL — ABNORMAL LOW (ref 11.6–15.9)
LYMPH%: 43.7 % (ref 14.0–48.0)
MCV: 91.8 fL (ref 81.0–101.0)
MONO#: 0.5 10*3/uL (ref 0.1–0.9)
MONO%: 9.1 % (ref 0.0–13.0)
NEUT#: 2.3 10*3/uL (ref 1.5–6.5)
Platelets: 160 10*3/uL (ref 145–400)
WBC: 5.2 10*3/uL (ref 3.9–10.0)

## 2007-03-29 ENCOUNTER — Encounter: Payer: Self-pay | Admitting: Internal Medicine

## 2007-03-29 LAB — CBC WITH DIFFERENTIAL/PLATELET
BASO%: 0.4 % (ref 0.0–2.0)
Eosinophils Absolute: 0.1 10*3/uL (ref 0.0–0.5)
HCT: 30.5 % — ABNORMAL LOW (ref 34.8–46.6)
LYMPH%: 48.2 % — ABNORMAL HIGH (ref 14.0–48.0)
MCHC: 34.8 g/dL (ref 32.0–36.0)
MCV: 91.4 fL (ref 81.0–101.0)
MONO%: 10.6 % (ref 0.0–13.0)
NEUT%: 38.3 % — ABNORMAL LOW (ref 39.6–76.8)
Platelets: 180 10*3/uL (ref 145–400)
RBC: 3.34 10*6/uL — ABNORMAL LOW (ref 3.70–5.32)

## 2007-04-02 ENCOUNTER — Ambulatory Visit: Payer: Self-pay | Admitting: Internal Medicine

## 2007-04-02 DIAGNOSIS — R609 Edema, unspecified: Secondary | ICD-10-CM

## 2007-04-02 LAB — BASIC METABOLIC PANEL
CO2: 25 mEq/L (ref 19–32)
Calcium: 9.7 mg/dL (ref 8.4–10.5)
Creatinine, Ser: 1.41 mg/dL — ABNORMAL HIGH (ref 0.40–1.20)
Glucose, Bld: 93 mg/dL (ref 70–99)
Sodium: 138 mEq/L (ref 135–145)

## 2007-04-02 LAB — PROTEIN ELECTROPHORESIS, SERUM
Alpha-2-Globulin: 8.6 % (ref 7.1–11.8)
Gamma Globulin: 33.9 % — ABNORMAL HIGH (ref 11.1–18.8)
M-Spike, %: 2.77 g/dL
Total Protein, Serum Electrophoresis: 9.1 g/dL — ABNORMAL HIGH (ref 6.0–8.3)

## 2007-04-09 ENCOUNTER — Encounter (INDEPENDENT_AMBULATORY_CARE_PROVIDER_SITE_OTHER): Payer: Self-pay | Admitting: *Deleted

## 2007-04-09 LAB — CONVERTED CEMR LAB
BUN: 36 mg/dL — ABNORMAL HIGH (ref 6–23)
Creatinine, Ser: 1.2 mg/dL (ref 0.4–1.2)
HDL: 49.4 mg/dL (ref >39.0)
Potassium: 3.6 meq/L (ref 3.5–5.1)
VLDL: 32 mg/dL (ref 0–40)

## 2007-04-10 ENCOUNTER — Ambulatory Visit: Payer: Self-pay | Admitting: Internal Medicine

## 2007-04-10 DIAGNOSIS — E785 Hyperlipidemia, unspecified: Secondary | ICD-10-CM | POA: Insufficient documentation

## 2007-04-10 LAB — CONVERTED CEMR LAB: LDL Goal: 160 mg/dL

## 2007-04-15 ENCOUNTER — Encounter (INDEPENDENT_AMBULATORY_CARE_PROVIDER_SITE_OTHER): Payer: Self-pay | Admitting: *Deleted

## 2007-04-23 ENCOUNTER — Ambulatory Visit: Payer: Self-pay | Admitting: Gastroenterology

## 2007-04-23 LAB — CONVERTED CEMR LAB
Basophils Absolute: 0 10*3/uL (ref 0.0–0.1)
Bilirubin, Direct: 0.1 mg/dL (ref 0.0–0.3)
CO2: 31 meq/L (ref 19–32)
Eosinophils Absolute: 0.1 10*3/uL (ref 0.0–0.6)
GFR calc Af Amer: 62 mL/min
GFR calc non Af Amer: 51 mL/min
Glucose, Bld: 94 mg/dL (ref 70–99)
Hemoglobin: 9.5 g/dL — ABNORMAL LOW (ref 12.0–15.0)
Lymphocytes Relative: 47.4 % — ABNORMAL HIGH (ref 12.0–46.0)
MCHC: 33.3 g/dL (ref 30.0–36.0)
MCV: 93.1 fL (ref 78.0–100.0)
Monocytes Absolute: 0.6 10*3/uL (ref 0.2–0.7)
Monocytes Relative: 11.4 % — ABNORMAL HIGH (ref 3.0–11.0)
Neutro Abs: 2.2 10*3/uL (ref 1.4–7.7)
Sodium: 142 meq/L (ref 135–145)
TSH: 1.98 microintl units/mL (ref 0.35–5.50)
Total Protein: 9.9 g/dL — ABNORMAL HIGH (ref 6.0–8.3)
Vitamin B-12: 620 pg/mL (ref 211–911)

## 2007-04-24 ENCOUNTER — Ambulatory Visit: Payer: Self-pay | Admitting: Oncology

## 2007-04-26 LAB — CBC WITH DIFFERENTIAL/PLATELET
BASO%: 0.3 % (ref 0.0–2.0)
Eosinophils Absolute: 0.1 10*3/uL (ref 0.0–0.5)
LYMPH%: 49.3 % — ABNORMAL HIGH (ref 14.0–48.0)
MCHC: 34.7 g/dL (ref 32.0–36.0)
MONO#: 0.5 10*3/uL (ref 0.1–0.9)
NEUT#: 1.8 10*3/uL (ref 1.5–6.5)
Platelets: 147 10*3/uL (ref 145–400)
RBC: 2.9 10*6/uL — ABNORMAL LOW (ref 3.70–5.32)
RDW: 17.5 % — ABNORMAL HIGH (ref 11.3–14.5)
WBC: 4.7 10*3/uL (ref 3.9–10.0)
lymph#: 2.3 10*3/uL (ref 0.9–3.3)

## 2007-05-01 ENCOUNTER — Encounter: Payer: Self-pay | Admitting: Gastroenterology

## 2007-05-01 ENCOUNTER — Encounter: Payer: Self-pay | Admitting: Internal Medicine

## 2007-05-01 ENCOUNTER — Ambulatory Visit: Payer: Self-pay | Admitting: Gastroenterology

## 2007-05-02 ENCOUNTER — Encounter: Payer: Self-pay | Admitting: Internal Medicine

## 2007-05-02 ENCOUNTER — Inpatient Hospital Stay (HOSPITAL_COMMUNITY): Admission: EM | Admit: 2007-05-02 | Discharge: 2007-05-07 | Payer: Self-pay | Admitting: Emergency Medicine

## 2007-05-07 ENCOUNTER — Ambulatory Visit: Payer: Self-pay | Admitting: Gastroenterology

## 2007-05-10 LAB — CBC WITH DIFFERENTIAL/PLATELET
Basophils Absolute: 0 10*3/uL (ref 0.0–0.1)
Eosinophils Absolute: 0.1 10*3/uL (ref 0.0–0.5)
HGB: 10.1 g/dL — ABNORMAL LOW (ref 11.6–15.9)
LYMPH%: 47.8 % (ref 14.0–48.0)
MCV: 87.6 fL (ref 81.0–101.0)
MONO#: 0.5 10*3/uL (ref 0.1–0.9)
MONO%: 10.2 % (ref 0.0–13.0)
NEUT#: 1.8 10*3/uL (ref 1.5–6.5)
Platelets: 221 10*3/uL (ref 145–400)
RBC: 3.33 10*6/uL — ABNORMAL LOW (ref 3.70–5.32)
RDW: 21.7 % — ABNORMAL HIGH (ref 11.3–14.5)
WBC: 4.6 10*3/uL (ref 3.9–10.0)

## 2007-05-20 LAB — CBC WITH DIFFERENTIAL/PLATELET
Basophils Absolute: 0 10*3/uL (ref 0.0–0.1)
Eosinophils Absolute: 0.1 10*3/uL (ref 0.0–0.5)
HCT: 29.9 % — ABNORMAL LOW (ref 34.8–46.6)
LYMPH%: 58.6 % — ABNORMAL HIGH (ref 14.0–48.0)
MCV: 87.7 fL (ref 81.0–101.0)
MONO#: 0.4 10*3/uL (ref 0.1–0.9)
MONO%: 8.4 % (ref 0.0–13.0)
NEUT#: 1.4 10*3/uL — ABNORMAL LOW (ref 1.5–6.5)
NEUT%: 30.6 % — ABNORMAL LOW (ref 39.6–76.8)
Platelets: 176 10*3/uL (ref 145–400)
RBC: 3.41 10*6/uL — ABNORMAL LOW (ref 3.70–5.32)
WBC: 4.7 10*3/uL (ref 3.9–10.0)

## 2007-05-20 LAB — BASIC METABOLIC PANEL
BUN: 32 mg/dL — ABNORMAL HIGH (ref 6–23)
CO2: 24 mEq/L (ref 19–32)
Calcium: 9.8 mg/dL (ref 8.4–10.5)
Chloride: 101 mEq/L (ref 96–112)
Creatinine, Ser: 1.11 mg/dL (ref 0.40–1.20)
Glucose, Bld: 128 mg/dL — ABNORMAL HIGH (ref 70–99)

## 2007-05-23 ENCOUNTER — Ambulatory Visit: Payer: Self-pay | Admitting: Gastroenterology

## 2007-05-29 ENCOUNTER — Ambulatory Visit: Payer: Self-pay | Admitting: Internal Medicine

## 2007-05-29 LAB — CONVERTED CEMR LAB
BUN: 36 mg/dL — ABNORMAL HIGH (ref 6–23)
Hgb A1c MFr Bld: 6.5 % — ABNORMAL HIGH (ref 4.6–6.0)

## 2007-06-03 LAB — CBC WITH DIFFERENTIAL/PLATELET
BASO%: 0.3 % (ref 0.0–2.0)
Basophils Absolute: 0 10*3/uL (ref 0.0–0.1)
EOS%: 2 % (ref 0.0–7.0)
HGB: 9.5 g/dL — ABNORMAL LOW (ref 11.6–15.9)
MCH: 30.5 pg (ref 26.0–34.0)
MCHC: 35.1 g/dL (ref 32.0–36.0)
MCV: 86.8 fL (ref 81.0–101.0)
MONO%: 9.4 % (ref 0.0–13.0)
RBC: 3.1 10*6/uL — ABNORMAL LOW (ref 3.70–5.32)
RDW: 20.8 % — ABNORMAL HIGH (ref 11.3–14.5)
lymph#: 2.6 10*3/uL (ref 0.9–3.3)

## 2007-06-05 ENCOUNTER — Ambulatory Visit: Payer: Self-pay | Admitting: Internal Medicine

## 2007-06-07 ENCOUNTER — Ambulatory Visit: Payer: Self-pay | Admitting: Oncology

## 2007-06-12 ENCOUNTER — Encounter (HOSPITAL_COMMUNITY): Admission: RE | Admit: 2007-06-12 | Discharge: 2007-07-08 | Payer: Self-pay | Admitting: Oncology

## 2007-06-12 LAB — CBC WITH DIFFERENTIAL/PLATELET
Basophils Absolute: 0 10*3/uL (ref 0.0–0.1)
Eosinophils Absolute: 0.1 10*3/uL (ref 0.0–0.5)
HGB: 8.9 g/dL — ABNORMAL LOW (ref 11.6–15.9)
MCV: 87.6 fL (ref 81.0–101.0)
MONO%: 10.1 % (ref 0.0–13.0)
NEUT#: 1.6 10*3/uL (ref 1.5–6.5)
Platelets: 146 10*3/uL (ref 145–400)
RDW: 21.7 % — ABNORMAL HIGH (ref 11.3–14.5)

## 2007-06-18 ENCOUNTER — Ambulatory Visit: Payer: Self-pay | Admitting: Gastroenterology

## 2007-06-24 LAB — CBC WITH DIFFERENTIAL/PLATELET
Basophils Absolute: 0 10*3/uL (ref 0.0–0.1)
Eosinophils Absolute: 0.2 10*3/uL (ref 0.0–0.5)
HCT: 25 % — ABNORMAL LOW (ref 34.8–46.6)
HGB: 8.7 g/dL — ABNORMAL LOW (ref 11.6–15.9)
LYMPH%: 48.3 % — ABNORMAL HIGH (ref 14.0–48.0)
MCHC: 34.9 g/dL (ref 32.0–36.0)
MONO#: 0.4 10*3/uL (ref 0.1–0.9)
NEUT%: 38.3 % — ABNORMAL LOW (ref 39.6–76.8)
Platelets: 134 10*3/uL — ABNORMAL LOW (ref 145–400)
WBC: 4.6 10*3/uL (ref 3.9–10.0)

## 2007-06-25 LAB — COMPREHENSIVE METABOLIC PANEL
AST: 21 U/L (ref 0–37)
Alkaline Phosphatase: 38 U/L — ABNORMAL LOW (ref 39–117)
BUN: 43 mg/dL — ABNORMAL HIGH (ref 6–23)
Glucose, Bld: 70 mg/dL (ref 70–99)
Total Bilirubin: 0.7 mg/dL (ref 0.3–1.2)

## 2007-06-26 LAB — PROTEIN ELECTROPHORESIS, SERUM
Alpha-2-Globulin: 9.3 % (ref 7.1–11.8)
Beta 2: 3.8 % (ref 3.2–6.5)
Beta Globulin: 4.5 % — ABNORMAL LOW (ref 4.7–7.2)
Gamma Globulin: 32.1 % — ABNORMAL HIGH (ref 11.1–18.8)
M-Spike, %: 2.5 g/dL

## 2007-06-27 LAB — PROTEIN ELECTROPHORESIS, SERUM
Alpha-2-Globulin: 9.8 % (ref 7.1–11.8)
Beta 2: 3.8 % (ref 3.2–6.5)
Beta Globulin: 4.8 % (ref 4.7–7.2)
M-Spike, %: 2.43 g/dL

## 2007-07-03 ENCOUNTER — Ambulatory Visit: Payer: Self-pay | Admitting: Gastroenterology

## 2007-07-08 LAB — HOLD TUBE, BLOOD BANK

## 2007-07-08 LAB — CBC WITH DIFFERENTIAL/PLATELET
Basophils Absolute: 0 10*3/uL (ref 0.0–0.1)
Eosinophils Absolute: 0 10*3/uL (ref 0.0–0.5)
HCT: 28.5 % — ABNORMAL LOW (ref 34.8–46.6)
HGB: 10.2 g/dL — ABNORMAL LOW (ref 11.6–15.9)
LYMPH%: 68.8 % — ABNORMAL HIGH (ref 14.0–48.0)
MCV: 88.9 fL (ref 81.0–101.0)
MONO#: 0.1 10*3/uL (ref 0.1–0.9)
MONO%: 5.2 % (ref 0.0–13.0)
NEUT#: 0.3 10*3/uL — CL (ref 1.5–6.5)
NEUT%: 23.3 % — ABNORMAL LOW (ref 39.6–76.8)
Platelets: 100 10*3/uL — ABNORMAL LOW (ref 145–400)
WBC: 1.2 10*3/uL — ABNORMAL LOW (ref 3.9–10.0)

## 2007-07-10 ENCOUNTER — Ambulatory Visit: Payer: Self-pay | Admitting: Internal Medicine

## 2007-07-10 ENCOUNTER — Encounter: Payer: Self-pay | Admitting: Gastroenterology

## 2007-07-10 DIAGNOSIS — K589 Irritable bowel syndrome without diarrhea: Secondary | ICD-10-CM | POA: Insufficient documentation

## 2007-07-10 LAB — CBC WITH DIFFERENTIAL/PLATELET
Basophils Absolute: 0 10*3/uL (ref 0.0–0.1)
EOS%: 4.1 % (ref 0.0–7.0)
HCT: 26.8 % — ABNORMAL LOW (ref 34.8–46.6)
HGB: 9.8 g/dL — ABNORMAL LOW (ref 11.6–15.9)
LYMPH%: 53.8 % — ABNORMAL HIGH (ref 14.0–48.0)
MCH: 32.2 pg (ref 26.0–34.0)
MCV: 88.6 fL (ref 81.0–101.0)
NEUT%: 34.3 % — ABNORMAL LOW (ref 39.6–76.8)
Platelets: 98 10*3/uL — ABNORMAL LOW (ref 145–400)
lymph#: 0.7 10*3/uL — ABNORMAL LOW (ref 0.9–3.3)

## 2007-07-12 LAB — CBC WITH DIFFERENTIAL/PLATELET
Basophils Absolute: 0 10*3/uL (ref 0.0–0.1)
Eosinophils Absolute: 0 10*3/uL (ref 0.0–0.5)
HCT: 26.7 % — ABNORMAL LOW (ref 34.8–46.6)
HGB: 9.8 g/dL — ABNORMAL LOW (ref 11.6–15.9)
MCH: 32.3 pg (ref 26.0–34.0)
NEUT#: 0.6 10*3/uL — ABNORMAL LOW (ref 1.5–6.5)
NEUT%: 41.7 % (ref 39.6–76.8)
RDW: 18 % — ABNORMAL HIGH (ref 11.3–14.5)
lymph#: 0.7 10*3/uL — ABNORMAL LOW (ref 0.9–3.3)

## 2007-07-17 ENCOUNTER — Encounter: Payer: Self-pay | Admitting: Internal Medicine

## 2007-07-17 LAB — CBC WITH DIFFERENTIAL/PLATELET
Basophils Absolute: 0 10*3/uL (ref 0.0–0.1)
EOS%: 4.6 % (ref 0.0–7.0)
Eosinophils Absolute: 0.1 10*3/uL (ref 0.0–0.5)
HCT: 25.2 % — ABNORMAL LOW (ref 34.8–46.6)
HGB: 9 g/dL — ABNORMAL LOW (ref 11.6–15.9)
LYMPH%: 53.8 % — ABNORMAL HIGH (ref 14.0–48.0)
MCH: 31.7 pg (ref 26.0–34.0)
MCV: 89.2 fL (ref 81.0–101.0)
MONO%: 17.8 % — ABNORMAL HIGH (ref 0.0–13.0)
NEUT#: 0.4 10*3/uL — CL (ref 1.5–6.5)
NEUT%: 23.5 % — ABNORMAL LOW (ref 39.6–76.8)
Platelets: 117 10*3/uL — ABNORMAL LOW (ref 145–400)

## 2007-07-22 ENCOUNTER — Ambulatory Visit: Payer: Self-pay | Admitting: Oncology

## 2007-07-24 LAB — CBC WITH DIFFERENTIAL/PLATELET
EOS%: 2.2 % (ref 0.0–7.0)
Eosinophils Absolute: 0.1 10*3/uL (ref 0.0–0.5)
LYMPH%: 41.9 % (ref 14.0–48.0)
MCH: 33.2 pg (ref 26.0–34.0)
MCHC: 36.8 g/dL — ABNORMAL HIGH (ref 32.0–36.0)
MCV: 90.3 fL (ref 81.0–101.0)
MONO%: 22.3 % — ABNORMAL HIGH (ref 0.0–13.0)
NEUT#: 0.9 10*3/uL — ABNORMAL LOW (ref 1.5–6.5)
Platelets: 104 10*3/uL — ABNORMAL LOW (ref 145–400)
RBC: 2.62 10*6/uL — ABNORMAL LOW (ref 3.70–5.32)

## 2007-07-26 LAB — PROTEIN ELECTROPHORESIS, SERUM
Albumin ELP: 49.1 % — ABNORMAL LOW (ref 55.8–66.1)
Alpha-2-Globulin: 8 % (ref 7.1–11.8)
M-Spike, %: 2.35 g/dL
Total Protein, Serum Electrophoresis: 8.5 g/dL — ABNORMAL HIGH (ref 6.0–8.3)

## 2007-07-26 LAB — BASIC METABOLIC PANEL
Calcium: 9.6 mg/dL (ref 8.4–10.5)
Glucose, Bld: 117 mg/dL — ABNORMAL HIGH (ref 70–99)
Sodium: 140 mEq/L (ref 135–145)

## 2007-07-29 ENCOUNTER — Encounter: Payer: Self-pay | Admitting: Internal Medicine

## 2007-07-29 LAB — CBC WITH DIFFERENTIAL/PLATELET
Eosinophils Absolute: 0 10*3/uL (ref 0.0–0.5)
MONO#: 0.7 10*3/uL (ref 0.1–0.9)
NEUT#: 1.5 10*3/uL (ref 1.5–6.5)
RBC: 2.54 10*6/uL — ABNORMAL LOW (ref 3.70–5.32)
RDW: 18.3 % — ABNORMAL HIGH (ref 11.3–14.5)
WBC: 3.3 10*3/uL — ABNORMAL LOW (ref 3.9–10.0)

## 2007-08-02 ENCOUNTER — Other Ambulatory Visit: Admission: RE | Admit: 2007-08-02 | Discharge: 2007-08-02 | Payer: Self-pay | Admitting: Obstetrics and Gynecology

## 2007-08-05 LAB — CBC WITH DIFFERENTIAL/PLATELET
Basophils Absolute: 0 10*3/uL (ref 0.0–0.1)
EOS%: 0.7 % (ref 0.0–7.0)
HCT: 22.6 % — ABNORMAL LOW (ref 34.8–46.6)
HGB: 8 g/dL — ABNORMAL LOW (ref 11.6–15.9)
MCH: 33.8 pg (ref 26.0–34.0)
MCV: 94.7 fL (ref 81.0–101.0)
NEUT%: 58.3 % (ref 39.6–76.8)
lymph#: 1 10*3/uL (ref 0.9–3.3)

## 2007-08-06 ENCOUNTER — Ambulatory Visit: Payer: Self-pay | Admitting: Gastroenterology

## 2007-08-12 LAB — CBC WITH DIFFERENTIAL/PLATELET
Basophils Absolute: 0 10*3/uL (ref 0.0–0.1)
EOS%: 1.6 % (ref 0.0–7.0)
HGB: 8.6 g/dL — ABNORMAL LOW (ref 11.6–15.9)
LYMPH%: 31.6 % (ref 14.0–48.0)
MCH: 34.8 pg — ABNORMAL HIGH (ref 26.0–34.0)
MCV: 98 fL (ref 81.0–101.0)
MONO%: 11.7 % (ref 0.0–13.0)
NEUT%: 54.7 % (ref 39.6–76.8)
Platelets: 153 10*3/uL (ref 145–400)
RDW: 22.3 % — ABNORMAL HIGH (ref 11.3–14.5)

## 2007-08-14 LAB — PROTEIN ELECTROPHORESIS, SERUM
Albumin ELP: 48.7 % — ABNORMAL LOW (ref 55.8–66.1)
Beta 2: 3.3 % (ref 3.2–6.5)
Gamma Globulin: 31.7 % — ABNORMAL HIGH (ref 11.1–18.8)
M-Spike, %: 2.59 g/dL

## 2007-08-19 LAB — CBC WITH DIFFERENTIAL/PLATELET
EOS%: 2.2 % (ref 0.0–7.0)
LYMPH%: 29.6 % (ref 14.0–48.0)
MCH: 34.8 pg — ABNORMAL HIGH (ref 26.0–34.0)
MCHC: 35.2 g/dL (ref 32.0–36.0)
MCV: 98.8 fL (ref 81.0–101.0)
MONO%: 13.6 % — ABNORMAL HIGH (ref 0.0–13.0)
RBC: 2.5 10*6/uL — ABNORMAL LOW (ref 3.70–5.32)
RDW: 22.1 % — ABNORMAL HIGH (ref 11.3–14.5)

## 2007-08-20 ENCOUNTER — Telehealth (INDEPENDENT_AMBULATORY_CARE_PROVIDER_SITE_OTHER): Payer: Self-pay | Admitting: *Deleted

## 2007-08-26 LAB — CBC WITH DIFFERENTIAL/PLATELET
Basophils Absolute: 0 10*3/uL (ref 0.0–0.1)
Eosinophils Absolute: 0.1 10*3/uL (ref 0.0–0.5)
HGB: 9.2 g/dL — ABNORMAL LOW (ref 11.6–15.9)
NEUT#: 2.4 10*3/uL (ref 1.5–6.5)
RDW: 21.1 % — ABNORMAL HIGH (ref 11.3–14.5)
lymph#: 1.3 10*3/uL (ref 0.9–3.3)

## 2007-09-02 ENCOUNTER — Ambulatory Visit: Payer: Self-pay | Admitting: Oncology

## 2007-09-02 LAB — CBC WITH DIFFERENTIAL/PLATELET
Basophils Absolute: 0 10*3/uL (ref 0.0–0.1)
Eosinophils Absolute: 0.1 10*3/uL (ref 0.0–0.5)
HCT: 26.8 % — ABNORMAL LOW (ref 34.8–46.6)
HGB: 9.4 g/dL — ABNORMAL LOW (ref 11.6–15.9)
LYMPH%: 31.4 % (ref 14.0–48.0)
MCV: 100.5 fL (ref 81.0–101.0)
MONO%: 11.1 % (ref 0.0–13.0)
NEUT#: 2.1 10*3/uL (ref 1.5–6.5)
Platelets: 181 10*3/uL (ref 145–400)

## 2007-09-04 LAB — FERRITIN: Ferritin: 139 ng/mL (ref 10–291)

## 2007-09-04 LAB — COMPREHENSIVE METABOLIC PANEL
ALT: 15 U/L (ref 0–35)
CO2: 25 mEq/L (ref 19–32)
Chloride: 101 mEq/L (ref 96–112)
Sodium: 141 mEq/L (ref 135–145)
Total Bilirubin: 0.7 mg/dL (ref 0.3–1.2)
Total Protein: 9 g/dL — ABNORMAL HIGH (ref 6.0–8.3)

## 2007-09-04 LAB — PROTEIN ELECTROPHORESIS, SERUM
Beta Globulin: 4.7 % (ref 4.7–7.2)
Gamma Globulin: 33.5 % — ABNORMAL HIGH (ref 11.1–18.8)
M-Spike, %: 2.6 g/dL
Total Protein, Serum Electrophoresis: 9 g/dL — ABNORMAL HIGH (ref 6.0–8.3)

## 2007-09-04 LAB — IRON AND TIBC: UIBC: 218 ug/dL

## 2007-09-09 ENCOUNTER — Ambulatory Visit: Payer: Self-pay | Admitting: Internal Medicine

## 2007-09-10 LAB — CBC WITH DIFFERENTIAL/PLATELET
Basophils Absolute: 0 10*3/uL (ref 0.0–0.1)
Eosinophils Absolute: 0.2 10*3/uL (ref 0.0–0.5)
HCT: 28.1 % — ABNORMAL LOW (ref 34.8–46.6)
LYMPH%: 34.2 % (ref 14.0–48.0)
MCHC: 34.7 g/dL (ref 32.0–36.0)
MONO#: 0.5 10*3/uL (ref 0.1–0.9)
NEUT#: 2.1 10*3/uL (ref 1.5–6.5)
NEUT%: 48.6 % (ref 39.6–76.8)
Platelets: 195 10*3/uL (ref 145–400)
WBC: 4.3 10*3/uL (ref 3.9–10.0)

## 2007-09-15 LAB — CONVERTED CEMR LAB
BUN: 43 mg/dL — ABNORMAL HIGH (ref 6–23)
Potassium: 3.9 meq/L (ref 3.5–5.1)

## 2007-09-16 ENCOUNTER — Encounter (INDEPENDENT_AMBULATORY_CARE_PROVIDER_SITE_OTHER): Payer: Self-pay | Admitting: *Deleted

## 2007-09-16 ENCOUNTER — Ambulatory Visit: Payer: Self-pay | Admitting: Internal Medicine

## 2007-09-16 DIAGNOSIS — E119 Type 2 diabetes mellitus without complications: Secondary | ICD-10-CM

## 2007-09-17 LAB — CBC WITH DIFFERENTIAL/PLATELET
BASO%: 0.5 % (ref 0.0–2.0)
Basophils Absolute: 0 10e3/uL (ref 0.0–0.1)
EOS%: 4.1 % (ref 0.0–7.0)
Eosinophils Absolute: 0.2 10e3/uL (ref 0.0–0.5)
HCT: 28.8 % — ABNORMAL LOW (ref 34.8–46.6)
HGB: 9.9 g/dL — ABNORMAL LOW (ref 11.6–15.9)
LYMPH%: 35.4 % (ref 14.0–48.0)
MCH: 34.7 pg — ABNORMAL HIGH (ref 26.0–34.0)
MCHC: 34.6 g/dL (ref 32.0–36.0)
MCV: 100.4 fL (ref 81.0–101.0)
MONO#: 0.4 10e3/uL (ref 0.1–0.9)
MONO%: 10.3 % (ref 0.0–13.0)
NEUT#: 1.9 10e3/uL (ref 1.5–6.5)
NEUT%: 49.7 % (ref 39.6–76.8)
Platelets: 173 10e3/uL (ref 145–400)
RBC: 2.87 10e6/uL — ABNORMAL LOW (ref 3.70–5.32)
RDW: 19.8 % — ABNORMAL HIGH (ref 11.3–14.5)
WBC: 3.8 10e3/uL — ABNORMAL LOW (ref 3.9–10.0)
lymph#: 1.3 10e3/uL (ref 0.9–3.3)

## 2007-09-18 ENCOUNTER — Telehealth (INDEPENDENT_AMBULATORY_CARE_PROVIDER_SITE_OTHER): Payer: Self-pay | Admitting: *Deleted

## 2007-09-21 ENCOUNTER — Encounter (INDEPENDENT_AMBULATORY_CARE_PROVIDER_SITE_OTHER): Payer: Self-pay | Admitting: *Deleted

## 2007-09-24 ENCOUNTER — Telehealth (INDEPENDENT_AMBULATORY_CARE_PROVIDER_SITE_OTHER): Payer: Self-pay | Admitting: *Deleted

## 2007-09-25 ENCOUNTER — Encounter: Payer: Self-pay | Admitting: Internal Medicine

## 2007-09-25 LAB — CBC WITH DIFFERENTIAL/PLATELET
BASO%: 0.5 % (ref 0.0–2.0)
EOS%: 1.9 % (ref 0.0–7.0)
MCH: 34 pg (ref 26.0–34.0)
MCHC: 34 g/dL (ref 32.0–36.0)
MONO%: 10.3 % (ref 0.0–13.0)
NEUT%: 54.5 % (ref 39.6–76.8)
RDW: 19.4 % — ABNORMAL HIGH (ref 11.3–14.5)
lymph#: 1.4 10*3/uL (ref 0.9–3.3)

## 2007-10-21 ENCOUNTER — Encounter: Admission: RE | Admit: 2007-10-21 | Discharge: 2007-10-21 | Payer: Self-pay | Admitting: Sports Medicine

## 2007-10-23 ENCOUNTER — Encounter: Payer: Self-pay | Admitting: Internal Medicine

## 2007-10-24 ENCOUNTER — Ambulatory Visit: Payer: Self-pay | Admitting: Oncology

## 2007-10-25 ENCOUNTER — Encounter: Admission: RE | Admit: 2007-10-25 | Discharge: 2007-10-25 | Payer: Self-pay | Admitting: Orthopaedic Surgery

## 2007-10-28 ENCOUNTER — Encounter (HOSPITAL_COMMUNITY): Admission: RE | Admit: 2007-10-28 | Discharge: 2007-11-25 | Payer: Self-pay | Admitting: Oncology

## 2007-10-28 ENCOUNTER — Encounter: Payer: Self-pay | Admitting: Internal Medicine

## 2007-10-28 LAB — CBC WITH DIFFERENTIAL/PLATELET
BASO%: 0.5 % (ref 0.0–2.0)
Basophils Absolute: 0 10*3/uL (ref 0.0–0.1)
EOS%: 3.8 % (ref 0.0–7.0)
HGB: 9 g/dL — ABNORMAL LOW (ref 11.6–15.9)
MCH: 33.6 pg (ref 26.0–34.0)
RBC: 2.67 10*6/uL — ABNORMAL LOW (ref 3.70–5.32)
RDW: 18.6 % — ABNORMAL HIGH (ref 11.3–14.5)
lymph#: 1.5 10*3/uL (ref 0.9–3.3)

## 2007-10-28 LAB — COMPREHENSIVE METABOLIC PANEL
ALT: 11 U/L (ref 0–35)
BUN: 52 mg/dL — ABNORMAL HIGH (ref 6–23)
CO2: 29 mEq/L (ref 19–32)
Calcium: 9.7 mg/dL (ref 8.4–10.5)
Chloride: 96 mEq/L (ref 96–112)
Creatinine, Ser: 1.47 mg/dL — ABNORMAL HIGH (ref 0.40–1.20)
Glucose, Bld: 94 mg/dL (ref 70–99)

## 2007-10-28 LAB — HOLD TUBE, BLOOD BANK

## 2007-10-29 LAB — TYPE & CROSSMATCH - CHCC

## 2007-10-31 ENCOUNTER — Encounter: Payer: Self-pay | Admitting: Internal Medicine

## 2007-11-05 ENCOUNTER — Ambulatory Visit (HOSPITAL_COMMUNITY): Admission: RE | Admit: 2007-11-05 | Discharge: 2007-11-05 | Payer: Self-pay | Admitting: Oncology

## 2007-11-13 ENCOUNTER — Encounter: Payer: Self-pay | Admitting: Internal Medicine

## 2007-11-25 ENCOUNTER — Ambulatory Visit: Payer: Self-pay | Admitting: Cardiology

## 2007-11-25 ENCOUNTER — Ambulatory Visit: Payer: Self-pay | Admitting: Internal Medicine

## 2007-11-25 ENCOUNTER — Inpatient Hospital Stay (HOSPITAL_COMMUNITY): Admission: EM | Admit: 2007-11-25 | Discharge: 2007-11-28 | Payer: Self-pay | Admitting: Oncology

## 2007-11-25 ENCOUNTER — Ambulatory Visit: Payer: Self-pay | Admitting: Oncology

## 2007-11-25 LAB — COMPREHENSIVE METABOLIC PANEL
ALT: 20 U/L (ref 0–35)
AST: 20 U/L (ref 0–37)
Albumin: 3.1 g/dL — ABNORMAL LOW (ref 3.5–5.2)
Alkaline Phosphatase: 59 U/L (ref 39–117)
Calcium: 9.8 mg/dL (ref 8.4–10.5)
Chloride: 99 mEq/L (ref 96–112)
Creatinine, Ser: 1.33 mg/dL — ABNORMAL HIGH (ref 0.40–1.20)
Potassium: 4.3 mEq/L (ref 3.5–5.3)

## 2007-11-25 LAB — CBC WITH DIFFERENTIAL/PLATELET
BASO%: 0.6 % (ref 0.0–2.0)
EOS%: 7.1 % — ABNORMAL HIGH (ref 0.0–7.0)
MCH: 31.2 pg (ref 26.0–34.0)
MCHC: 33 g/dL (ref 32.0–36.0)
MCV: 94.3 fL (ref 81.0–101.0)
MONO%: 11.1 % (ref 0.0–13.0)
RDW: 16 % — ABNORMAL HIGH (ref 11.3–14.5)
lymph#: 1.6 10*3/uL (ref 0.9–3.3)

## 2007-11-26 ENCOUNTER — Encounter (HOSPITAL_COMMUNITY): Admission: RE | Admit: 2007-11-26 | Discharge: 2007-11-28 | Payer: Self-pay | Admitting: Oncology

## 2007-11-27 ENCOUNTER — Encounter: Payer: Self-pay | Admitting: Oncology

## 2007-12-04 ENCOUNTER — Ambulatory Visit: Payer: Self-pay | Admitting: Oncology

## 2007-12-04 ENCOUNTER — Encounter: Payer: Self-pay | Admitting: Internal Medicine

## 2007-12-04 DIAGNOSIS — C880 Waldenstrom macroglobulinemia: Secondary | ICD-10-CM | POA: Insufficient documentation

## 2007-12-04 DIAGNOSIS — K7689 Other specified diseases of liver: Secondary | ICD-10-CM | POA: Insufficient documentation

## 2007-12-04 DIAGNOSIS — M1990 Unspecified osteoarthritis, unspecified site: Secondary | ICD-10-CM | POA: Insufficient documentation

## 2007-12-04 DIAGNOSIS — K5730 Diverticulosis of large intestine without perforation or abscess without bleeding: Secondary | ICD-10-CM | POA: Insufficient documentation

## 2007-12-04 LAB — CBC WITH DIFFERENTIAL/PLATELET
Eosinophils Absolute: 0.1 10*3/uL (ref 0.0–0.5)
HCT: 32.1 % — ABNORMAL LOW (ref 34.8–46.6)
LYMPH%: 30.8 % (ref 14.0–48.0)
MCV: 91.7 fL (ref 81.0–101.0)
MONO#: 0.5 10*3/uL (ref 0.1–0.9)
MONO%: 9.6 % (ref 0.0–13.0)
NEUT#: 2.8 10*3/uL (ref 1.5–6.5)
NEUT%: 56.2 % (ref 39.6–76.8)
Platelets: 117 10*3/uL — ABNORMAL LOW (ref 145–400)
RBC: 3.5 10*6/uL — ABNORMAL LOW (ref 3.70–5.32)
WBC: 5 10*3/uL (ref 3.9–10.0)

## 2007-12-04 LAB — BASIC METABOLIC PANEL
BUN: 32 mg/dL — ABNORMAL HIGH (ref 6–23)
CO2: 22 mEq/L (ref 19–32)
Calcium: 7.9 mg/dL — ABNORMAL LOW (ref 8.4–10.5)
Creatinine, Ser: 1.44 mg/dL — ABNORMAL HIGH (ref 0.40–1.20)
Glucose, Bld: 142 mg/dL — ABNORMAL HIGH (ref 70–99)

## 2007-12-05 ENCOUNTER — Other Ambulatory Visit: Admission: RE | Admit: 2007-12-05 | Discharge: 2007-12-05 | Payer: Self-pay | Admitting: Oncology

## 2007-12-05 ENCOUNTER — Encounter: Payer: Self-pay | Admitting: Oncology

## 2007-12-05 LAB — CBC WITH DIFFERENTIAL/PLATELET
BASO%: 0.7 % (ref 0.0–2.0)
EOS%: 3.4 % (ref 0.0–7.0)
HGB: 11 g/dL — ABNORMAL LOW (ref 11.6–15.9)
MCH: 31.7 pg (ref 26.0–34.0)
MCHC: 34.6 g/dL (ref 32.0–36.0)
RBC: 3.46 10*6/uL — ABNORMAL LOW (ref 3.70–5.32)
RDW: 18.2 % — ABNORMAL HIGH (ref 11.3–14.5)
lymph#: 1.8 10*3/uL (ref 0.9–3.3)

## 2007-12-09 ENCOUNTER — Inpatient Hospital Stay (HOSPITAL_COMMUNITY): Admission: EM | Admit: 2007-12-09 | Discharge: 2007-12-16 | Payer: Self-pay | Admitting: Emergency Medicine

## 2007-12-17 ENCOUNTER — Ambulatory Visit: Payer: Self-pay | Admitting: Internal Medicine

## 2007-12-18 ENCOUNTER — Encounter: Payer: Self-pay | Admitting: Internal Medicine

## 2007-12-18 LAB — CBC WITH DIFFERENTIAL/PLATELET
BASO%: 0.6 % (ref 0.0–2.0)
Basophils Absolute: 0 10*3/uL (ref 0.0–0.1)
EOS%: 0 % (ref 0.0–7.0)
HCT: 31.4 % — ABNORMAL LOW (ref 34.8–46.6)
HGB: 10.9 g/dL — ABNORMAL LOW (ref 11.6–15.9)
LYMPH%: 22.4 % (ref 14.0–48.0)
MCH: 31.4 pg (ref 26.0–34.0)
MCHC: 34.8 g/dL (ref 32.0–36.0)
MCV: 90.2 fL (ref 81.0–101.0)
NEUT%: 70.7 % (ref 39.6–76.8)
Platelets: 102 10*3/uL — ABNORMAL LOW (ref 145–400)
lymph#: 1.4 10*3/uL (ref 0.9–3.3)

## 2007-12-18 LAB — BASIC METABOLIC PANEL
BUN: 21 mg/dL (ref 6–23)
Calcium: 9 mg/dL (ref 8.4–10.5)
Chloride: 101 mEq/L (ref 96–112)
Creatinine, Ser: 1.13 mg/dL (ref 0.40–1.20)

## 2007-12-27 LAB — CBC WITH DIFFERENTIAL/PLATELET
EOS%: 2.5 % (ref 0.0–7.0)
Eosinophils Absolute: 0.1 10*3/uL (ref 0.0–0.5)
HGB: 10.9 g/dL — ABNORMAL LOW (ref 11.6–15.9)
MCH: 31.7 pg (ref 26.0–34.0)
MCV: 90.5 fL (ref 81.0–101.0)
MONO%: 9.2 % (ref 0.0–13.0)
NEUT#: 3 10*3/uL (ref 1.5–6.5)
RBC: 3.43 10*6/uL — ABNORMAL LOW (ref 3.70–5.32)
RDW: 18.3 % — ABNORMAL HIGH (ref 11.3–14.5)
lymph#: 1.7 10*3/uL (ref 0.9–3.3)

## 2007-12-31 ENCOUNTER — Encounter: Payer: Self-pay | Admitting: Family Medicine

## 2007-12-31 LAB — CBC WITH DIFFERENTIAL/PLATELET
Basophils Absolute: 0 10*3/uL (ref 0.0–0.1)
Eosinophils Absolute: 0.2 10*3/uL (ref 0.0–0.5)
HGB: 11.3 g/dL — ABNORMAL LOW (ref 11.6–15.9)
MONO#: 0.9 10*3/uL (ref 0.1–0.9)
NEUT#: 3.5 10*3/uL (ref 1.5–6.5)
RBC: 3.57 10*6/uL — ABNORMAL LOW (ref 3.70–5.32)
RDW: 18.3 % — ABNORMAL HIGH (ref 11.3–14.5)
WBC: 6.3 10*3/uL (ref 3.9–10.0)

## 2008-01-01 ENCOUNTER — Encounter: Payer: Self-pay | Admitting: Internal Medicine

## 2008-01-02 ENCOUNTER — Ambulatory Visit: Payer: Self-pay | Admitting: Family Medicine

## 2008-01-02 ENCOUNTER — Telehealth (INDEPENDENT_AMBULATORY_CARE_PROVIDER_SITE_OTHER): Payer: Self-pay | Admitting: *Deleted

## 2008-01-07 ENCOUNTER — Ambulatory Visit: Payer: Self-pay | Admitting: Internal Medicine

## 2008-01-08 ENCOUNTER — Telehealth (INDEPENDENT_AMBULATORY_CARE_PROVIDER_SITE_OTHER): Payer: Self-pay | Admitting: *Deleted

## 2008-01-08 ENCOUNTER — Encounter (INDEPENDENT_AMBULATORY_CARE_PROVIDER_SITE_OTHER): Payer: Self-pay | Admitting: *Deleted

## 2008-01-09 ENCOUNTER — Encounter: Payer: Self-pay | Admitting: Internal Medicine

## 2008-01-09 LAB — CBC WITH DIFFERENTIAL/PLATELET
Basophils Absolute: 0 10*3/uL (ref 0.0–0.1)
EOS%: 0 % (ref 0.0–7.0)
HGB: 10.3 g/dL — ABNORMAL LOW (ref 11.6–15.9)
MCH: 31.7 pg (ref 26.0–34.0)
MONO#: 0.6 10*3/uL (ref 0.1–0.9)
NEUT#: 2.3 10*3/uL (ref 1.5–6.5)
RDW: 17.6 % — ABNORMAL HIGH (ref 11.3–14.5)
WBC: 4.4 10*3/uL (ref 3.9–10.0)
lymph#: 1.5 10*3/uL (ref 0.9–3.3)

## 2008-01-09 LAB — COMPREHENSIVE METABOLIC PANEL
ALT: 25 U/L (ref 0–35)
AST: 13 U/L (ref 0–37)
Albumin: 3.9 g/dL (ref 3.5–5.2)
BUN: 23 mg/dL (ref 6–23)
Calcium: 8.7 mg/dL (ref 8.4–10.5)
Chloride: 102 mEq/L (ref 96–112)
Potassium: 3.3 mEq/L — ABNORMAL LOW (ref 3.5–5.3)

## 2008-01-21 ENCOUNTER — Ambulatory Visit: Payer: Self-pay | Admitting: Oncology

## 2008-01-21 ENCOUNTER — Encounter: Payer: Self-pay | Admitting: Internal Medicine

## 2008-01-23 LAB — CBC WITH DIFFERENTIAL/PLATELET
BASO%: 0.2 % (ref 0.0–2.0)
EOS%: 0.6 % (ref 0.0–7.0)
HGB: 10 g/dL — ABNORMAL LOW (ref 11.6–15.9)
MCH: 32.9 pg (ref 26.0–34.0)
MCHC: 35.7 g/dL (ref 32.0–36.0)
RDW: 19.6 % — ABNORMAL HIGH (ref 11.3–14.5)
lymph#: 1.3 10*3/uL (ref 0.9–3.3)

## 2008-02-06 LAB — CBC WITH DIFFERENTIAL/PLATELET
Basophils Absolute: 0 10*3/uL (ref 0.0–0.1)
EOS%: 0.8 % (ref 0.0–7.0)
Eosinophils Absolute: 0 10*3/uL (ref 0.0–0.5)
HGB: 9.4 g/dL — ABNORMAL LOW (ref 11.6–15.9)
NEUT#: 2 10*3/uL (ref 1.5–6.5)
RDW: 20.3 % — ABNORMAL HIGH (ref 11.3–14.5)
lymph#: 1.3 10*3/uL (ref 0.9–3.3)

## 2008-02-10 LAB — PROTEIN ELECTROPHORESIS, SERUM
Albumin ELP: 50.4 % — ABNORMAL LOW (ref 55.8–66.1)
Alpha-1-Globulin: 5.1 % — ABNORMAL HIGH (ref 2.9–4.9)
Alpha-2-Globulin: 11.7 % (ref 7.1–11.8)
Beta 2: 3.3 % (ref 3.2–6.5)
Beta Globulin: 5.6 % (ref 4.7–7.2)
Gamma Globulin: 23.9 % — ABNORMAL HIGH (ref 11.1–18.8)

## 2008-02-10 LAB — COMPREHENSIVE METABOLIC PANEL
AST: 12 U/L (ref 0–37)
Albumin: 3.6 g/dL (ref 3.5–5.2)
Alkaline Phosphatase: 53 U/L (ref 39–117)
BUN: 20 mg/dL (ref 6–23)
Glucose, Bld: 155 mg/dL — ABNORMAL HIGH (ref 70–99)
Potassium: 3.6 mEq/L (ref 3.5–5.3)
Total Bilirubin: 0.8 mg/dL (ref 0.3–1.2)

## 2008-02-12 ENCOUNTER — Telehealth (INDEPENDENT_AMBULATORY_CARE_PROVIDER_SITE_OTHER): Payer: Self-pay | Admitting: *Deleted

## 2008-03-03 ENCOUNTER — Ambulatory Visit: Payer: Self-pay | Admitting: Oncology

## 2008-03-05 LAB — CBC WITH DIFFERENTIAL/PLATELET
BASO%: 0.1 % (ref 0.0–2.0)
Basophils Absolute: 0 10*3/uL (ref 0.0–0.1)
EOS%: 0.9 % (ref 0.0–7.0)
HGB: 9 g/dL — ABNORMAL LOW (ref 11.6–15.9)
MCH: 34 pg (ref 26.0–34.0)
MCHC: 34.6 g/dL (ref 32.0–36.0)
RBC: 2.65 10*6/uL — ABNORMAL LOW (ref 3.70–5.32)
RDW: 18.9 % — ABNORMAL HIGH (ref 11.3–14.5)
lymph#: 1.4 10*3/uL (ref 0.9–3.3)

## 2008-03-09 LAB — COMPREHENSIVE METABOLIC PANEL
ALT: 15 U/L (ref 0–35)
AST: 10 U/L (ref 0–37)
Albumin: 3.8 g/dL (ref 3.5–5.2)
Alkaline Phosphatase: 46 U/L (ref 39–117)
Calcium: 9 mg/dL (ref 8.4–10.5)
Chloride: 105 mEq/L (ref 96–112)
Potassium: 3.8 mEq/L (ref 3.5–5.3)
Sodium: 142 mEq/L (ref 135–145)

## 2008-03-09 LAB — PROTEIN ELECTROPHORESIS, SERUM
Albumin ELP: 51.8 % — ABNORMAL LOW (ref 55.8–66.1)
Beta 2: 3.4 % (ref 3.2–6.5)
Total Protein, Serum Electrophoresis: 7 g/dL (ref 6.0–8.3)

## 2008-03-18 ENCOUNTER — Encounter: Payer: Self-pay | Admitting: Internal Medicine

## 2008-03-26 LAB — CBC WITH DIFFERENTIAL/PLATELET
BASO%: 0.1 % (ref 0.0–2.0)
EOS%: 0.6 % (ref 0.0–7.0)
LYMPH%: 35.5 % (ref 14.0–48.0)
MCH: 35.1 pg — ABNORMAL HIGH (ref 26.0–34.0)
MCHC: 34.6 g/dL (ref 32.0–36.0)
MONO#: 0.5 10*3/uL (ref 0.1–0.9)
Platelets: 177 10*3/uL (ref 145–400)
RBC: 2.74 10*6/uL — ABNORMAL LOW (ref 3.70–5.32)
WBC: 5 10*3/uL (ref 3.9–10.0)

## 2008-03-26 LAB — HOLD TUBE, BLOOD BANK

## 2008-04-15 ENCOUNTER — Ambulatory Visit: Payer: Self-pay | Admitting: Oncology

## 2008-04-20 LAB — CBC WITH DIFFERENTIAL/PLATELET
Basophils Absolute: 0 10*3/uL (ref 0.0–0.1)
Eosinophils Absolute: 0.2 10*3/uL (ref 0.0–0.5)
HCT: 28 % — ABNORMAL LOW (ref 34.8–46.6)
HGB: 9.9 g/dL — ABNORMAL LOW (ref 11.6–15.9)
LYMPH%: 29.1 % (ref 14.0–48.0)
MCV: 99.6 fL (ref 81.0–101.0)
MONO%: 13 % (ref 0.0–13.0)
NEUT#: 2.9 10*3/uL (ref 1.5–6.5)
NEUT%: 54.2 % (ref 39.6–76.8)
Platelets: 143 10*3/uL — ABNORMAL LOW (ref 145–400)

## 2008-04-22 ENCOUNTER — Ambulatory Visit: Payer: Self-pay | Admitting: Internal Medicine

## 2008-04-22 LAB — BASIC METABOLIC PANEL
BUN: 14 mg/dL (ref 6–23)
Calcium: 9 mg/dL (ref 8.4–10.5)
Glucose, Bld: 112 mg/dL — ABNORMAL HIGH (ref 70–99)
Potassium: 3.3 mEq/L — ABNORMAL LOW (ref 3.5–5.3)

## 2008-04-22 LAB — CONVERTED CEMR LAB
ALT: 18 units/L (ref 0–35)
Albumin: 3.6 g/dL (ref 3.5–5.2)
Alkaline Phosphatase: 45 units/L (ref 39–117)
BUN: 12 mg/dL (ref 6–23)
Bilirubin, Direct: 0.1 mg/dL (ref 0.0–0.3)
Cholesterol: 171 mg/dL (ref 0–200)
Creatinine, Ser: 0.9 mg/dL (ref 0.4–1.2)
LDL Cholesterol: 96 mg/dL (ref 0–99)
Microalb Creat Ratio: 18.7 mg/g (ref 0.0–30.0)
Total Protein: 7.4 g/dL (ref 6.0–8.3)
Triglycerides: 74 mg/dL (ref 0–149)
VLDL: 15 mg/dL (ref 0–40)

## 2008-04-22 LAB — PROTEIN ELECTROPHORESIS, SERUM
Beta Globulin: 6.1 % (ref 4.7–7.2)
M-Spike, %: 0.96 g/dL
Total Protein, Serum Electrophoresis: 6.9 g/dL (ref 6.0–8.3)

## 2008-04-27 ENCOUNTER — Ambulatory Visit: Payer: Self-pay | Admitting: Internal Medicine

## 2008-04-27 DIAGNOSIS — E039 Hypothyroidism, unspecified: Secondary | ICD-10-CM | POA: Insufficient documentation

## 2008-04-28 ENCOUNTER — Ambulatory Visit (HOSPITAL_COMMUNITY): Admission: RE | Admit: 2008-04-28 | Discharge: 2008-04-28 | Payer: Self-pay | Admitting: Oncology

## 2008-04-28 ENCOUNTER — Encounter: Payer: Self-pay | Admitting: Internal Medicine

## 2008-05-04 ENCOUNTER — Encounter: Payer: Self-pay | Admitting: Internal Medicine

## 2008-05-04 LAB — CBC WITH DIFFERENTIAL/PLATELET
Basophils Absolute: 0 10*3/uL (ref 0.0–0.1)
EOS%: 1 % (ref 0.0–7.0)
MCH: 34.5 pg — ABNORMAL HIGH (ref 26.0–34.0)
MCV: 99.2 fL (ref 81.0–101.0)
MONO%: 10.1 % (ref 0.0–13.0)
RBC: 2.92 10*6/uL — ABNORMAL LOW (ref 3.70–5.32)
RDW: 16.2 % — ABNORMAL HIGH (ref 11.3–14.5)

## 2008-05-04 LAB — TSH: TSH: 1.189 u[IU]/mL (ref 0.350–4.500)

## 2008-05-05 ENCOUNTER — Telehealth (INDEPENDENT_AMBULATORY_CARE_PROVIDER_SITE_OTHER): Payer: Self-pay | Admitting: *Deleted

## 2008-05-21 ENCOUNTER — Encounter: Payer: Self-pay | Admitting: Internal Medicine

## 2008-05-29 ENCOUNTER — Ambulatory Visit: Payer: Self-pay | Admitting: Oncology

## 2008-06-02 LAB — CBC WITH DIFFERENTIAL/PLATELET
Basophils Absolute: 0 10*3/uL (ref 0.0–0.1)
Eosinophils Absolute: 0.1 10*3/uL (ref 0.0–0.5)
HCT: 30.3 % — ABNORMAL LOW (ref 34.8–46.6)
HGB: 10.4 g/dL — ABNORMAL LOW (ref 11.6–15.9)
MCV: 101 fL (ref 81.0–101.0)
NEUT#: 1.5 10*3/uL (ref 1.5–6.5)
RDW: 15.8 % — ABNORMAL HIGH (ref 11.3–14.5)
lymph#: 2 10*3/uL (ref 0.9–3.3)

## 2008-06-25 ENCOUNTER — Encounter: Payer: Self-pay | Admitting: Internal Medicine

## 2008-06-25 LAB — CBC WITH DIFFERENTIAL/PLATELET
Basophils Absolute: 0 10*3/uL (ref 0.0–0.1)
Eosinophils Absolute: 0 10*3/uL (ref 0.0–0.5)
HCT: 28.8 % — ABNORMAL LOW (ref 34.8–46.6)
HGB: 10 g/dL — ABNORMAL LOW (ref 11.6–15.9)
LYMPH%: 30.3 % (ref 14.0–48.0)
MCV: 99.7 fL (ref 81.0–101.0)
MONO#: 0.8 10*3/uL (ref 0.1–0.9)
MONO%: 18.9 % — ABNORMAL HIGH (ref 0.0–13.0)
NEUT#: 2.2 10*3/uL (ref 1.5–6.5)
NEUT%: 50.5 % (ref 39.6–76.8)
Platelets: 150 10*3/uL (ref 145–400)
WBC: 4.3 10*3/uL (ref 3.9–10.0)

## 2008-06-29 LAB — PROTEIN ELECTROPHORESIS, SERUM
Albumin ELP: 55.2 % — ABNORMAL LOW (ref 55.8–66.1)
Alpha-1-Globulin: 4.6 % (ref 2.9–4.9)
Beta Globulin: 6.6 % (ref 4.7–7.2)
Total Protein, Serum Electrophoresis: 7.2 g/dL (ref 6.0–8.3)

## 2008-06-29 LAB — COMPREHENSIVE METABOLIC PANEL
Alkaline Phosphatase: 43 U/L (ref 39–117)
BUN: 20 mg/dL (ref 6–23)
CO2: 28 mEq/L (ref 19–32)
Glucose, Bld: 82 mg/dL (ref 70–99)
Sodium: 145 mEq/L (ref 135–145)
Total Bilirubin: 0.7 mg/dL (ref 0.3–1.2)
Total Protein: 7.2 g/dL (ref 6.0–8.3)

## 2008-07-23 ENCOUNTER — Ambulatory Visit: Payer: Self-pay | Admitting: Oncology

## 2008-07-27 LAB — CBC WITH DIFFERENTIAL/PLATELET
Basophils Absolute: 0 10*3/uL (ref 0.0–0.1)
Eosinophils Absolute: 0.1 10*3/uL (ref 0.0–0.5)
HGB: 10.2 g/dL — ABNORMAL LOW (ref 11.6–15.9)
MCV: 99.9 fL (ref 81.0–101.0)
MONO#: 0.7 10*3/uL (ref 0.1–0.9)
MONO%: 12.3 % (ref 0.0–13.0)
NEUT#: 2.8 10*3/uL (ref 1.5–6.5)
RBC: 2.99 10*6/uL — ABNORMAL LOW (ref 3.70–5.32)
RDW: 16.5 % — ABNORMAL HIGH (ref 11.3–14.5)
WBC: 5.9 10*3/uL (ref 3.9–10.0)
lymph#: 2.2 10*3/uL (ref 0.9–3.3)

## 2008-08-20 ENCOUNTER — Encounter: Payer: Self-pay | Admitting: Internal Medicine

## 2008-08-20 LAB — CBC WITH DIFFERENTIAL/PLATELET
BASO%: 0.2 % (ref 0.0–2.0)
Basophils Absolute: 0 10*3/uL (ref 0.0–0.1)
EOS%: 0.8 % (ref 0.0–7.0)
MCH: 33.9 pg (ref 26.0–34.0)
MCHC: 33.9 g/dL (ref 32.0–36.0)
MCV: 99.8 fL (ref 81.0–101.0)
MONO%: 14.5 % — ABNORMAL HIGH (ref 0.0–13.0)
RBC: 3.12 10*6/uL — ABNORMAL LOW (ref 3.70–5.32)
RDW: 16.5 % — ABNORMAL HIGH (ref 11.3–14.5)

## 2008-08-24 LAB — COMPREHENSIVE METABOLIC PANEL
ALT: 16 U/L (ref 0–35)
AST: 13 U/L (ref 0–37)
Albumin: 4.4 g/dL (ref 3.5–5.2)
Alkaline Phosphatase: 44 U/L (ref 39–117)
BUN: 15 mg/dL (ref 6–23)
Potassium: 3.3 mEq/L — ABNORMAL LOW (ref 3.5–5.3)

## 2008-08-24 LAB — PROTEIN ELECTROPHORESIS, SERUM
Alpha-1-Globulin: 5.2 % — ABNORMAL HIGH (ref 2.9–4.9)
Alpha-2-Globulin: 12.3 % — ABNORMAL HIGH (ref 7.1–11.8)
Beta 2: 3.2 % (ref 3.2–6.5)
Gamma Globulin: 17.1 % (ref 11.1–18.8)
M-Spike, %: 0.92 g/dL

## 2008-09-16 ENCOUNTER — Ambulatory Visit: Payer: Self-pay | Admitting: Oncology

## 2008-09-18 ENCOUNTER — Encounter: Payer: Self-pay | Admitting: Internal Medicine

## 2008-09-18 LAB — COMPREHENSIVE METABOLIC PANEL
BUN: 19 mg/dL (ref 6–23)
CO2: 26 mEq/L (ref 19–32)
Calcium: 9 mg/dL (ref 8.4–10.5)
Chloride: 104 mEq/L (ref 96–112)
Creatinine, Ser: 0.93 mg/dL (ref 0.40–1.20)
Glucose, Bld: 108 mg/dL — ABNORMAL HIGH (ref 70–99)

## 2008-09-18 LAB — CBC WITH DIFFERENTIAL/PLATELET
Basophils Absolute: 0 10*3/uL (ref 0.0–0.1)
HCT: 29.6 % — ABNORMAL LOW (ref 34.8–46.6)
HGB: 10.3 g/dL — ABNORMAL LOW (ref 11.6–15.9)
MCH: 34.6 pg — ABNORMAL HIGH (ref 26.0–34.0)
MONO#: 0.8 10*3/uL (ref 0.1–0.9)
NEUT%: 46.5 % (ref 39.6–76.8)
WBC: 4.9 10*3/uL (ref 3.9–10.0)
lymph#: 1.8 10*3/uL (ref 0.9–3.3)

## 2008-09-22 LAB — IGG, IGA, IGM
IgG (Immunoglobin G), Serum: 451 mg/dL — ABNORMAL LOW (ref 694–1618)
IgM, Serum: 1060 mg/dL — ABNORMAL HIGH (ref 60–263)

## 2008-09-22 LAB — PROTEIN ELECTROPHORESIS, SERUM
M-Spike, %: 0.81 g/dL
Total Protein, Serum Electrophoresis: 7 g/dL (ref 6.0–8.3)

## 2008-10-16 ENCOUNTER — Ambulatory Visit: Payer: Self-pay | Admitting: Oncology

## 2008-10-16 ENCOUNTER — Encounter: Payer: Self-pay | Admitting: Internal Medicine

## 2008-10-16 LAB — CBC WITH DIFFERENTIAL/PLATELET
BASO%: 0.2 % (ref 0.0–2.0)
HCT: 28.9 % — ABNORMAL LOW (ref 34.8–46.6)
HGB: 10 g/dL — ABNORMAL LOW (ref 11.6–15.9)
MCHC: 34.5 g/dL (ref 32.0–36.0)
MONO#: 0.7 10*3/uL (ref 0.1–0.9)
NEUT#: 2.4 10*3/uL (ref 1.5–6.5)
NEUT%: 52.8 % (ref 39.6–76.8)
WBC: 4.6 10*3/uL (ref 3.9–10.0)
lymph#: 1.4 10*3/uL (ref 0.9–3.3)

## 2008-10-20 LAB — IGG, IGA, IGM
IgA: 28 mg/dL — ABNORMAL LOW (ref 68–378)
IgG (Immunoglobin G), Serum: 419 mg/dL — ABNORMAL LOW (ref 694–1618)

## 2008-10-20 LAB — PROTEIN ELECTROPHORESIS, SERUM
Beta 2: 3.9 % (ref 3.2–6.5)
Gamma Globulin: 16.5 % (ref 11.1–18.8)

## 2008-10-20 LAB — COMPREHENSIVE METABOLIC PANEL
Albumin: 3.8 g/dL (ref 3.5–5.2)
CO2: 27 mEq/L (ref 19–32)
Glucose, Bld: 123 mg/dL — ABNORMAL HIGH (ref 70–99)
Potassium: 3.9 mEq/L (ref 3.5–5.3)
Sodium: 144 mEq/L (ref 135–145)
Total Bilirubin: 0.5 mg/dL (ref 0.3–1.2)
Total Protein: 6.5 g/dL (ref 6.0–8.3)

## 2008-10-21 ENCOUNTER — Ambulatory Visit: Payer: Self-pay | Admitting: Internal Medicine

## 2008-10-21 LAB — CONVERTED CEMR LAB
Creatinine,U: 121.3 mg/dL
Microalb Creat Ratio: 13.2 mg/g (ref 0.0–30.0)
Microalb, Ur: 1.6 mg/dL (ref 0.0–1.9)

## 2008-10-28 ENCOUNTER — Ambulatory Visit: Payer: Self-pay | Admitting: Internal Medicine

## 2008-10-28 DIAGNOSIS — K219 Gastro-esophageal reflux disease without esophagitis: Secondary | ICD-10-CM

## 2008-10-28 DIAGNOSIS — E876 Hypokalemia: Secondary | ICD-10-CM

## 2008-11-03 ENCOUNTER — Encounter (INDEPENDENT_AMBULATORY_CARE_PROVIDER_SITE_OTHER): Payer: Self-pay | Admitting: *Deleted

## 2008-11-03 LAB — CONVERTED CEMR LAB: BUN: 12 mg/dL (ref 6–23)

## 2008-11-05 ENCOUNTER — Telehealth (INDEPENDENT_AMBULATORY_CARE_PROVIDER_SITE_OTHER): Payer: Self-pay | Admitting: *Deleted

## 2008-11-12 ENCOUNTER — Telehealth (INDEPENDENT_AMBULATORY_CARE_PROVIDER_SITE_OTHER): Payer: Self-pay | Admitting: *Deleted

## 2008-11-13 ENCOUNTER — Encounter: Payer: Self-pay | Admitting: Internal Medicine

## 2008-11-13 LAB — CBC WITH DIFFERENTIAL/PLATELET
Basophils Absolute: 0 10*3/uL (ref 0.0–0.1)
Eosinophils Absolute: 0.2 10*3/uL (ref 0.0–0.5)
HGB: 10.3 g/dL — ABNORMAL LOW (ref 11.6–15.9)
LYMPH%: 43 % (ref 14.0–48.0)
MCV: 98.2 fL (ref 81.0–101.0)
MONO%: 12.3 % (ref 0.0–13.0)
NEUT#: 1.6 10*3/uL (ref 1.5–6.5)
Platelets: 130 10*3/uL — ABNORMAL LOW (ref 145–400)
RBC: 3.03 10*6/uL — ABNORMAL LOW (ref 3.70–5.32)

## 2008-11-17 LAB — COMPREHENSIVE METABOLIC PANEL
ALT: 13 U/L (ref 0–35)
AST: 11 U/L (ref 0–37)
Albumin: 3.9 g/dL (ref 3.5–5.2)
Alkaline Phosphatase: 43 U/L (ref 39–117)
BUN: 12 mg/dL (ref 6–23)
Potassium: 3.4 mEq/L — ABNORMAL LOW (ref 3.5–5.3)
Sodium: 141 mEq/L (ref 135–145)

## 2008-11-17 LAB — IGG, IGA, IGM: IgM, Serum: 1470 mg/dL — ABNORMAL HIGH (ref 60–263)

## 2008-11-17 LAB — PROTEIN ELECTROPHORESIS, SERUM
Alpha-1-Globulin: 4.9 % (ref 2.9–4.9)
Alpha-2-Globulin: 12.2 % — ABNORMAL HIGH (ref 7.1–11.8)
Gamma Globulin: 17.2 % (ref 11.1–18.8)

## 2008-12-09 ENCOUNTER — Ambulatory Visit: Payer: Self-pay | Admitting: Oncology

## 2008-12-11 ENCOUNTER — Encounter: Payer: Self-pay | Admitting: Internal Medicine

## 2008-12-11 LAB — CBC WITH DIFFERENTIAL/PLATELET
Basophils Absolute: 0 10*3/uL (ref 0.0–0.1)
EOS%: 4.3 % (ref 0.0–7.0)
Eosinophils Absolute: 0.2 10*3/uL (ref 0.0–0.5)
HGB: 10.4 g/dL — ABNORMAL LOW (ref 11.6–15.9)
MONO#: 0.6 10*3/uL (ref 0.1–0.9)
NEUT#: 1.8 10*3/uL (ref 1.5–6.5)
RDW: 16 % — ABNORMAL HIGH (ref 11.2–14.5)
WBC: 4.2 10*3/uL (ref 3.9–10.3)
lymph#: 1.6 10*3/uL (ref 0.9–3.3)

## 2008-12-15 LAB — BASIC METABOLIC PANEL
BUN: 11 mg/dL (ref 6–23)
CO2: 25 mEq/L (ref 19–32)
Chloride: 105 mEq/L (ref 96–112)
Glucose, Bld: 105 mg/dL — ABNORMAL HIGH (ref 70–99)
Potassium: 3.7 mEq/L (ref 3.5–5.3)

## 2008-12-15 LAB — PROTEIN ELECTROPHORESIS, SERUM
Albumin ELP: 56.7 % (ref 55.8–66.1)
Beta 2: 3.1 % — ABNORMAL LOW (ref 3.2–6.5)
Beta Globulin: 6.2 % (ref 4.7–7.2)

## 2009-01-22 LAB — CBC WITH DIFFERENTIAL/PLATELET
Basophils Absolute: 0 10*3/uL (ref 0.0–0.1)
EOS%: 1.6 % (ref 0.0–7.0)
Eosinophils Absolute: 0.1 10*3/uL (ref 0.0–0.5)
HCT: 29.5 % — ABNORMAL LOW (ref 34.8–46.6)
HGB: 10.2 g/dL — ABNORMAL LOW (ref 11.6–15.9)
LYMPH%: 50.3 % — ABNORMAL HIGH (ref 14.0–49.7)
MCH: 34.2 pg — ABNORMAL HIGH (ref 25.1–34.0)
MCV: 98.3 fL (ref 79.5–101.0)
MONO%: 10.4 % (ref 0.0–14.0)
NEUT#: 1.7 10*3/uL (ref 1.5–6.5)
NEUT%: 37.4 % — ABNORMAL LOW (ref 38.4–76.8)
Platelets: 165 10*3/uL (ref 145–400)

## 2009-01-26 LAB — COMPREHENSIVE METABOLIC PANEL
Albumin: 3.9 g/dL (ref 3.5–5.2)
Alkaline Phosphatase: 41 U/L (ref 39–117)
BUN: 19 mg/dL (ref 6–23)
Creatinine, Ser: 0.9 mg/dL (ref 0.40–1.20)
Glucose, Bld: 105 mg/dL — ABNORMAL HIGH (ref 70–99)
Potassium: 4 mEq/L (ref 3.5–5.3)
Total Bilirubin: 0.6 mg/dL (ref 0.3–1.2)

## 2009-01-26 LAB — PROTEIN ELECTROPHORESIS, SERUM
Albumin ELP: 52 % — ABNORMAL LOW (ref 55.8–66.1)
Alpha-1-Globulin: 3.9 % (ref 2.9–4.9)
Beta Globulin: 5.3 % (ref 4.7–7.2)
Total Protein, Serum Electrophoresis: 7.7 g/dL (ref 6.0–8.3)

## 2009-03-02 ENCOUNTER — Ambulatory Visit: Payer: Self-pay | Admitting: Oncology

## 2009-03-04 ENCOUNTER — Encounter: Payer: Self-pay | Admitting: Internal Medicine

## 2009-03-04 LAB — CBC WITH DIFFERENTIAL/PLATELET
BASO%: 0.3 % (ref 0.0–2.0)
Basophils Absolute: 0 10*3/uL (ref 0.0–0.1)
EOS%: 1.2 % (ref 0.0–7.0)
HCT: 27.6 % — ABNORMAL LOW (ref 34.8–46.6)
HGB: 9.6 g/dL — ABNORMAL LOW (ref 11.6–15.9)
LYMPH%: 48.8 % (ref 14.0–49.7)
MCH: 34.7 pg — ABNORMAL HIGH (ref 25.1–34.0)
MCHC: 34.7 g/dL (ref 31.5–36.0)
MONO#: 0.5 10*3/uL (ref 0.1–0.9)
NEUT%: 39 % (ref 38.4–76.8)
Platelets: 147 10*3/uL (ref 145–400)

## 2009-03-09 LAB — PROTEIN ELECTROPHORESIS, SERUM
Albumin ELP: 50.3 % — ABNORMAL LOW (ref 55.8–66.1)
Alpha-1-Globulin: 3.7 % (ref 2.9–4.9)
Beta 2: 3 % — ABNORMAL LOW (ref 3.2–6.5)
Beta Globulin: 5.1 % (ref 4.7–7.2)
Gamma Globulin: 29.2 % — ABNORMAL HIGH (ref 11.1–18.8)

## 2009-03-09 LAB — COMPREHENSIVE METABOLIC PANEL
ALT: 9 U/L (ref 0–35)
BUN: 18 mg/dL (ref 6–23)
CO2: 28 mEq/L (ref 19–32)
Calcium: 9.5 mg/dL (ref 8.4–10.5)
Chloride: 103 mEq/L (ref 96–112)
Creatinine, Ser: 0.89 mg/dL (ref 0.40–1.20)
Total Bilirubin: 0.6 mg/dL (ref 0.3–1.2)

## 2009-03-10 ENCOUNTER — Encounter: Payer: Self-pay | Admitting: Internal Medicine

## 2009-03-25 ENCOUNTER — Encounter: Payer: Self-pay | Admitting: Internal Medicine

## 2009-04-07 ENCOUNTER — Encounter: Payer: Self-pay | Admitting: Internal Medicine

## 2009-04-07 ENCOUNTER — Ambulatory Visit: Payer: Self-pay | Admitting: Oncology

## 2009-04-07 LAB — CBC WITH DIFFERENTIAL/PLATELET
Basophils Absolute: 0 10*3/uL (ref 0.0–0.1)
Eosinophils Absolute: 0.1 10*3/uL (ref 0.0–0.5)
HGB: 9 g/dL — ABNORMAL LOW (ref 11.6–15.9)
MONO#: 0.5 10*3/uL (ref 0.1–0.9)
NEUT#: 1.7 10*3/uL (ref 1.5–6.5)
RDW: 12.8 % (ref 11.2–14.5)
WBC: 4.6 10*3/uL (ref 3.9–10.3)
lymph#: 2.3 10*3/uL (ref 0.9–3.3)

## 2009-04-07 LAB — BASIC METABOLIC PANEL
BUN: 22 mg/dL (ref 6–23)
Chloride: 105 mEq/L (ref 96–112)
Glucose, Bld: 98 mg/dL (ref 70–99)
Potassium: 4.1 mEq/L (ref 3.5–5.3)
Sodium: 140 mEq/L (ref 135–145)

## 2009-04-15 ENCOUNTER — Ambulatory Visit (HOSPITAL_COMMUNITY): Admission: RE | Admit: 2009-04-15 | Discharge: 2009-04-15 | Payer: Self-pay | Admitting: Oncology

## 2009-04-15 HISTORY — PX: PORTACATH PLACEMENT: SHX2246

## 2009-04-23 LAB — UIFE/LIGHT CHAINS/TP QN, 24-HR UR
Albumin, U: DETECTED
Alpha 1, Urine: DETECTED — AB
Alpha 2, Urine: DETECTED — AB
Beta, Urine: DETECTED — AB
Free Lambda Lt Chains,Ur: 0.18 mg/dL (ref 0.08–1.01)
Gamma Globulin, Urine: DETECTED — AB
Total Protein, Urine-Ur/day: 389 mg/d — ABNORMAL HIGH (ref 10–140)

## 2009-04-27 ENCOUNTER — Encounter (INDEPENDENT_AMBULATORY_CARE_PROVIDER_SITE_OTHER): Payer: Self-pay | Admitting: *Deleted

## 2009-04-27 ENCOUNTER — Ambulatory Visit: Payer: Self-pay | Admitting: Internal Medicine

## 2009-04-28 ENCOUNTER — Encounter: Payer: Self-pay | Admitting: Internal Medicine

## 2009-04-28 LAB — CBC WITH DIFFERENTIAL/PLATELET
BASO%: 0.3 % (ref 0.0–2.0)
Eosinophils Absolute: 0.1 10*3/uL (ref 0.0–0.5)
HCT: 27.9 % — ABNORMAL LOW (ref 34.8–46.6)
HGB: 8.9 g/dL — ABNORMAL LOW (ref 11.6–15.9)
MCHC: 31.9 g/dL (ref 31.5–36.0)
MONO#: 0.9 10*3/uL (ref 0.1–0.9)
NEUT#: 2.7 10*3/uL (ref 1.5–6.5)
NEUT%: 44.3 % (ref 38.4–76.8)
Platelets: 111 10*3/uL — ABNORMAL LOW (ref 145–400)
WBC: 6.1 10*3/uL (ref 3.9–10.3)
lymph#: 2.4 10*3/uL (ref 0.9–3.3)

## 2009-04-30 ENCOUNTER — Ambulatory Visit: Payer: Self-pay | Admitting: Oncology

## 2009-04-30 ENCOUNTER — Encounter (INDEPENDENT_AMBULATORY_CARE_PROVIDER_SITE_OTHER): Payer: Self-pay | Admitting: *Deleted

## 2009-04-30 LAB — CONVERTED CEMR LAB
ALT: 15 units/L (ref 0–35)
AST: 18 units/L (ref 0–37)
Alkaline Phosphatase: 33 units/L — ABNORMAL LOW (ref 39–117)
Bilirubin, Direct: 0.1 mg/dL (ref 0.0–0.3)
Hgb A1c MFr Bld: 5.8 % (ref 4.6–6.5)
LDL Cholesterol: 61 mg/dL (ref 0–99)
Total Bilirubin: 0.8 mg/dL (ref 0.3–1.2)
Total CHOL/HDL Ratio: 3
Total Protein: 10.4 g/dL — ABNORMAL HIGH (ref 6.0–8.3)
Triglycerides: 168 mg/dL — ABNORMAL HIGH (ref 0.0–149.0)

## 2009-05-04 ENCOUNTER — Ambulatory Visit: Payer: Self-pay | Admitting: Internal Medicine

## 2009-05-05 LAB — CBC WITH DIFFERENTIAL/PLATELET
BASO%: 0.3 % (ref 0.0–2.0)
Basophils Absolute: 0 10e3/uL (ref 0.0–0.1)
EOS%: 1.6 % (ref 0.0–7.0)
Eosinophils Absolute: 0.1 10e3/uL (ref 0.0–0.5)
HCT: 26 % — ABNORMAL LOW (ref 34.8–46.6)
HGB: 9.2 g/dL — ABNORMAL LOW (ref 11.6–15.9)
LYMPH%: 38.3 % (ref 14.0–49.7)
MCH: 35.9 pg — ABNORMAL HIGH (ref 25.1–34.0)
MCHC: 35.3 g/dL (ref 31.5–36.0)
MCV: 101.8 fL — ABNORMAL HIGH (ref 79.5–101.0)
MONO#: 0.4 10e3/uL (ref 0.1–0.9)
MONO%: 8.9 % (ref 0.0–14.0)
NEUT#: 2.5 10e3/uL (ref 1.5–6.5)
NEUT%: 50.9 % (ref 38.4–76.8)
Platelets: 111 10e3/uL — ABNORMAL LOW (ref 145–400)
RBC: 2.56 10e6/uL — ABNORMAL LOW (ref 3.70–5.45)
RDW: 13.3 % (ref 11.2–14.5)
WBC: 4.9 10e3/uL (ref 3.9–10.3)
lymph#: 1.9 10e3/uL (ref 0.9–3.3)

## 2009-05-06 ENCOUNTER — Telehealth (INDEPENDENT_AMBULATORY_CARE_PROVIDER_SITE_OTHER): Payer: Self-pay | Admitting: *Deleted

## 2009-05-06 ENCOUNTER — Encounter (INDEPENDENT_AMBULATORY_CARE_PROVIDER_SITE_OTHER): Payer: Self-pay | Admitting: *Deleted

## 2009-05-19 ENCOUNTER — Ambulatory Visit (HOSPITAL_COMMUNITY): Admission: RE | Admit: 2009-05-19 | Discharge: 2009-05-19 | Payer: Self-pay | Admitting: Oncology

## 2009-05-19 LAB — COMPREHENSIVE METABOLIC PANEL
ALT: 16 U/L (ref 0–35)
AST: 20 U/L (ref 0–37)
Creatinine, Ser: 0.84 mg/dL (ref 0.40–1.20)
Total Bilirubin: 0.8 mg/dL (ref 0.3–1.2)

## 2009-05-19 LAB — CBC WITH DIFFERENTIAL/PLATELET
BASO%: 0.3 % (ref 0.0–2.0)
EOS%: 1.1 % (ref 0.0–7.0)
HCT: 26 % — ABNORMAL LOW (ref 34.8–46.6)
LYMPH%: 47.3 % (ref 14.0–49.7)
MCH: 35.1 pg — ABNORMAL HIGH (ref 25.1–34.0)
MCHC: 34.7 g/dL (ref 31.5–36.0)
MCV: 101.3 fL — ABNORMAL HIGH (ref 79.5–101.0)
MONO%: 13.2 % (ref 0.0–14.0)
NEUT%: 38.1 % — ABNORMAL LOW (ref 38.4–76.8)
Platelets: 157 10*3/uL (ref 145–400)

## 2009-05-26 LAB — CBC WITH DIFFERENTIAL/PLATELET
BASO%: 0.2 % (ref 0.0–2.0)
Basophils Absolute: 0 10*3/uL (ref 0.0–0.1)
EOS%: 0.8 % (ref 0.0–7.0)
HCT: 28.6 % — ABNORMAL LOW (ref 34.8–46.6)
HGB: 9.5 g/dL — ABNORMAL LOW (ref 11.6–15.9)
LYMPH%: 51.2 % — ABNORMAL HIGH (ref 14.0–49.7)
MCH: 33.5 pg (ref 25.1–34.0)
MCHC: 33.2 g/dL (ref 31.5–36.0)
MONO#: 0.7 10*3/uL (ref 0.1–0.9)
NEUT%: 33 % — ABNORMAL LOW (ref 38.4–76.8)
Platelets: 108 10*3/uL — ABNORMAL LOW (ref 145–400)

## 2009-05-31 ENCOUNTER — Ambulatory Visit: Payer: Self-pay | Admitting: Oncology

## 2009-06-02 ENCOUNTER — Encounter: Payer: Self-pay | Admitting: Internal Medicine

## 2009-06-02 LAB — CBC WITH DIFFERENTIAL/PLATELET
BASO%: 0 % (ref 0.0–2.0)
Basophils Absolute: 0 10*3/uL (ref 0.0–0.1)
EOS%: 0.7 % (ref 0.0–7.0)
HCT: 29.6 % — ABNORMAL LOW (ref 34.8–46.6)
MCH: 33.6 pg (ref 25.1–34.0)
MCHC: 33.4 g/dL (ref 31.5–36.0)
MCV: 100.3 fL (ref 79.5–101.0)
MONO%: 13.3 % (ref 0.0–14.0)
NEUT%: 43.6 % (ref 38.4–76.8)
lymph#: 2.3 10*3/uL (ref 0.9–3.3)

## 2009-06-02 LAB — COMPREHENSIVE METABOLIC PANEL
ALT: 11 U/L (ref 0–35)
AST: 14 U/L (ref 0–37)
Alkaline Phosphatase: 46 U/L (ref 39–117)
BUN: 21 mg/dL (ref 6–23)
Chloride: 104 mEq/L (ref 96–112)
Creatinine, Ser: 0.91 mg/dL (ref 0.40–1.20)
Total Bilirubin: 0.4 mg/dL (ref 0.3–1.2)

## 2009-06-04 LAB — PROTEIN ELECTROPHORESIS, SERUM
Alpha-1-Globulin: 5 % — ABNORMAL HIGH (ref 2.9–4.9)
Alpha-2-Globulin: 10.4 % (ref 7.1–11.8)
Beta 2: 2.1 % — ABNORMAL LOW (ref 3.2–6.5)
Beta Globulin: 5.9 % (ref 4.7–7.2)
Gamma Globulin: 20.8 % — ABNORMAL HIGH (ref 11.1–18.8)
M-Spike, %: 1.3 g/dL

## 2009-06-04 LAB — IGM: IgM, Serum: 1640 mg/dL — ABNORMAL HIGH (ref 60–263)

## 2009-06-16 LAB — CBC WITH DIFFERENTIAL/PLATELET
Basophils Absolute: 0 10*3/uL (ref 0.0–0.1)
HCT: 31.4 % — ABNORMAL LOW (ref 34.8–46.6)
HGB: 10.5 g/dL — ABNORMAL LOW (ref 11.6–15.9)
LYMPH%: 48.5 % (ref 14.0–49.7)
MONO#: 0.7 10*3/uL (ref 0.1–0.9)
NEUT%: 36.1 % — ABNORMAL LOW (ref 38.4–76.8)
Platelets: 168 10*3/uL (ref 145–400)
WBC: 4.9 10*3/uL (ref 3.9–10.3)
lymph#: 2.4 10*3/uL (ref 0.9–3.3)

## 2009-06-21 ENCOUNTER — Encounter: Payer: Self-pay | Admitting: Internal Medicine

## 2009-06-28 ENCOUNTER — Ambulatory Visit: Payer: Self-pay | Admitting: Oncology

## 2009-06-30 LAB — CBC WITH DIFFERENTIAL/PLATELET
Basophils Absolute: 0 10*3/uL (ref 0.0–0.1)
Eosinophils Absolute: 0 10*3/uL (ref 0.0–0.5)
HCT: 31.6 % — ABNORMAL LOW (ref 34.8–46.6)
HGB: 10.4 g/dL — ABNORMAL LOW (ref 11.6–15.9)
LYMPH%: 64 % — ABNORMAL HIGH (ref 14.0–49.7)
MCH: 32.8 pg (ref 25.1–34.0)
MCV: 99.7 fL (ref 79.5–101.0)
MONO%: 12.9 % (ref 0.0–14.0)
NEUT#: 0.9 10*3/uL — ABNORMAL LOW (ref 1.5–6.5)
NEUT%: 22.4 % — ABNORMAL LOW (ref 38.4–76.8)
Platelets: 197 10*3/uL (ref 145–400)
RDW: 13.1 % (ref 11.2–14.5)

## 2009-07-07 LAB — CBC WITH DIFFERENTIAL/PLATELET
EOS%: 1 % (ref 0.0–7.0)
Eosinophils Absolute: 0 10*3/uL (ref 0.0–0.5)
LYMPH%: 60.4 % — ABNORMAL HIGH (ref 14.0–49.7)
MCH: 32.7 pg (ref 25.1–34.0)
MCV: 97.3 fL (ref 79.5–101.0)
MONO%: 26.2 % — ABNORMAL HIGH (ref 0.0–14.0)
NEUT#: 0.5 10*3/uL — ABNORMAL LOW (ref 1.5–6.5)
Platelets: 182 10*3/uL (ref 145–400)
RBC: 3.67 10*6/uL — ABNORMAL LOW (ref 3.70–5.45)
nRBC: 0 % (ref 0–0)

## 2009-07-14 ENCOUNTER — Encounter: Payer: Self-pay | Admitting: Internal Medicine

## 2009-07-14 LAB — CBC WITH DIFFERENTIAL/PLATELET
BASO%: 0.1 % (ref 0.0–2.0)
Eosinophils Absolute: 0 10*3/uL (ref 0.0–0.5)
MCHC: 33 g/dL (ref 31.5–36.0)
MCV: 98.6 fL (ref 79.5–101.0)
MONO#: 0.7 10*3/uL (ref 0.1–0.9)
MONO%: 8.9 % (ref 0.0–14.0)
NEUT#: 3.3 10*3/uL (ref 1.5–6.5)
RBC: 3.56 10*6/uL — ABNORMAL LOW (ref 3.70–5.45)
RDW: 13.1 % (ref 11.2–14.5)
WBC: 7.3 10*3/uL (ref 3.9–10.3)

## 2009-07-16 ENCOUNTER — Encounter: Payer: Self-pay | Admitting: Internal Medicine

## 2009-07-21 LAB — CBC WITH DIFFERENTIAL/PLATELET
BASO%: 0.2 % (ref 0.0–2.0)
Basophils Absolute: 0 10*3/uL (ref 0.0–0.1)
EOS%: 1.1 % (ref 0.0–7.0)
Eosinophils Absolute: 0.1 10*3/uL (ref 0.0–0.5)
HCT: 31.9 % — ABNORMAL LOW (ref 34.8–46.6)
HGB: 10.5 g/dL — ABNORMAL LOW (ref 11.6–15.9)
LYMPH%: 40.4 % (ref 14.0–49.7)
MCH: 32.6 pg (ref 25.1–34.0)
MCHC: 32.9 g/dL (ref 31.5–36.0)
MCV: 99.1 fL (ref 79.5–101.0)
MONO#: 0.6 10*3/uL (ref 0.1–0.9)
MONO%: 13.8 % (ref 0.0–14.0)
NEUT#: 2.1 10*3/uL (ref 1.5–6.5)
NEUT%: 44.5 % (ref 38.4–76.8)
Platelets: 135 10*3/uL — ABNORMAL LOW (ref 145–400)
RBC: 3.22 10*6/uL — ABNORMAL LOW (ref 3.70–5.45)
RDW: 13.2 % (ref 11.2–14.5)
WBC: 4.7 10*3/uL (ref 3.9–10.3)
lymph#: 1.9 10*3/uL (ref 0.9–3.3)

## 2009-07-27 ENCOUNTER — Ambulatory Visit: Payer: Self-pay | Admitting: Oncology

## 2009-07-29 LAB — BASIC METABOLIC PANEL
CO2: 26 mEq/L (ref 19–32)
Calcium: 8.4 mg/dL (ref 8.4–10.5)
Chloride: 105 mEq/L (ref 96–112)
Creatinine, Ser: 0.79 mg/dL (ref 0.40–1.20)
Sodium: 141 mEq/L (ref 135–145)

## 2009-07-29 LAB — CBC WITH DIFFERENTIAL/PLATELET
BASO%: 0 % (ref 0.0–2.0)
EOS%: 0.2 % (ref 0.0–7.0)
MCH: 32.4 pg (ref 25.1–34.0)
MCHC: 32.7 g/dL (ref 31.5–36.0)
MONO#: 0.4 10*3/uL (ref 0.1–0.9)
NEUT%: 69.9 % (ref 38.4–76.8)
RBC: 3.12 10*6/uL — ABNORMAL LOW (ref 3.70–5.45)
WBC: 6.3 10*3/uL (ref 3.9–10.3)
lymph#: 1.5 10*3/uL (ref 0.9–3.3)

## 2009-08-02 LAB — PROTEIN ELECTROPHORESIS, SERUM
Albumin ELP: 56.5 % (ref 55.8–66.1)
Alpha-1-Globulin: 4.9 % (ref 2.9–4.9)
Alpha-2-Globulin: 12.5 % — ABNORMAL HIGH (ref 7.1–11.8)
Beta 2: 4.1 % (ref 3.2–6.5)
Gamma Globulin: 16.1 % (ref 11.1–18.8)

## 2009-08-11 LAB — CBC WITH DIFFERENTIAL/PLATELET
BASO%: 0.3 % (ref 0.0–2.0)
EOS%: 0.3 % (ref 0.0–7.0)
HGB: 12.2 g/dL (ref 11.6–15.9)
MCH: 33.6 pg (ref 25.1–34.0)
MCHC: 34.1 g/dL (ref 31.5–36.0)
MONO#: 0.7 10*3/uL (ref 0.1–0.9)
RDW: 13.4 % (ref 11.2–14.5)
WBC: 8.2 10*3/uL (ref 3.9–10.3)
lymph#: 2.4 10*3/uL (ref 0.9–3.3)

## 2009-08-16 ENCOUNTER — Encounter: Payer: Self-pay | Admitting: Internal Medicine

## 2009-08-20 ENCOUNTER — Ambulatory Visit: Payer: Self-pay | Admitting: Oncology

## 2009-08-24 LAB — CBC WITH DIFFERENTIAL/PLATELET
BASO%: 0.2 % (ref 0.0–2.0)
Basophils Absolute: 0 10*3/uL (ref 0.0–0.1)
Eosinophils Absolute: 0 10*3/uL (ref 0.0–0.5)
HCT: 38.2 % (ref 34.8–46.6)
HGB: 12.7 g/dL (ref 11.6–15.9)
MONO#: 0.5 10*3/uL (ref 0.1–0.9)
NEUT#: 2.3 10*3/uL (ref 1.5–6.5)
NEUT%: 46.7 % (ref 38.4–76.8)
WBC: 5 10*3/uL (ref 3.9–10.3)
lymph#: 2.2 10*3/uL (ref 0.9–3.3)

## 2009-09-01 ENCOUNTER — Encounter: Admission: RE | Admit: 2009-09-01 | Discharge: 2009-09-01 | Payer: Self-pay | Admitting: Orthopaedic Surgery

## 2009-09-01 LAB — CBC WITH DIFFERENTIAL/PLATELET
BASO%: 0.2 % (ref 0.0–2.0)
Basophils Absolute: 0 10*3/uL (ref 0.0–0.1)
EOS%: 1.1 % (ref 0.0–7.0)
Eosinophils Absolute: 0.1 10*3/uL (ref 0.0–0.5)
HCT: 34.9 % (ref 34.8–46.6)
HGB: 11.3 g/dL — ABNORMAL LOW (ref 11.6–15.9)
LYMPH%: 28.4 % (ref 14.0–49.7)
MCH: 31.8 pg (ref 25.1–34.0)
MCHC: 32.4 g/dL (ref 31.5–36.0)
MCV: 98.3 fL (ref 79.5–101.0)
MONO#: 0.9 10*3/uL (ref 0.1–0.9)
MONO%: 16 % — ABNORMAL HIGH (ref 0.0–14.0)
NEUT#: 2.9 10*3/uL (ref 1.5–6.5)
NEUT%: 54.3 % (ref 38.4–76.8)
Platelets: 127 10*3/uL — ABNORMAL LOW (ref 145–400)
RBC: 3.55 10*6/uL — ABNORMAL LOW (ref 3.70–5.45)
RDW: 12.7 % (ref 11.2–14.5)
WBC: 5.4 10*3/uL (ref 3.9–10.3)
lymph#: 1.5 10*3/uL (ref 0.9–3.3)

## 2009-09-08 LAB — CBC WITH DIFFERENTIAL/PLATELET
Eosinophils Absolute: 0.1 10*3/uL (ref 0.0–0.5)
LYMPH%: 30.8 % (ref 14.0–49.7)
MCHC: 33.1 g/dL (ref 31.5–36.0)
MCV: 96.6 fL (ref 79.5–101.0)
MONO%: 12.8 % (ref 0.0–14.0)
NEUT#: 3.5 10*3/uL (ref 1.5–6.5)
NEUT%: 55.4 % (ref 38.4–76.8)
Platelets: 102 10*3/uL — ABNORMAL LOW (ref 145–400)
RBC: 3.56 10*6/uL — ABNORMAL LOW (ref 3.70–5.45)
nRBC: 0 % (ref 0–0)

## 2009-09-14 ENCOUNTER — Other Ambulatory Visit: Admission: RE | Admit: 2009-09-14 | Discharge: 2009-09-14 | Payer: Self-pay | Admitting: Obstetrics and Gynecology

## 2009-09-20 ENCOUNTER — Ambulatory Visit: Payer: Self-pay | Admitting: Oncology

## 2009-09-22 LAB — CBC WITH DIFFERENTIAL/PLATELET
BASO%: 0.1 % (ref 0.0–2.0)
EOS%: 1 % (ref 0.0–7.0)
HCT: 33.3 % — ABNORMAL LOW (ref 34.8–46.6)
LYMPH%: 25.2 % (ref 14.0–49.7)
MCH: 31.9 pg (ref 25.1–34.0)
MCHC: 33 g/dL (ref 31.5–36.0)
MCV: 96.5 fL (ref 79.5–101.0)
MONO%: 16 % — ABNORMAL HIGH (ref 0.0–14.0)
NEUT%: 57.7 % (ref 38.4–76.8)
lymph#: 2 10*3/uL (ref 0.9–3.3)

## 2009-09-24 ENCOUNTER — Telehealth (INDEPENDENT_AMBULATORY_CARE_PROVIDER_SITE_OTHER): Payer: Self-pay | Admitting: *Deleted

## 2009-09-29 LAB — CBC WITH DIFFERENTIAL/PLATELET
BASO%: 0 % (ref 0.0–2.0)
LYMPH%: 29.2 % (ref 14.0–49.7)
MCHC: 32.6 g/dL (ref 31.5–36.0)
MONO#: 1 10*3/uL — ABNORMAL HIGH (ref 0.1–0.9)
MONO%: 14.7 % — ABNORMAL HIGH (ref 0.0–14.0)
NEUT#: 3.7 10*3/uL (ref 1.5–6.5)
Platelets: 148 10*3/uL (ref 145–400)
RBC: 3.75 10*6/uL (ref 3.70–5.45)
RDW: 13.1 % (ref 11.2–14.5)
WBC: 6.8 10*3/uL (ref 3.9–10.3)
nRBC: 0 % (ref 0–0)

## 2009-10-04 ENCOUNTER — Telehealth (INDEPENDENT_AMBULATORY_CARE_PROVIDER_SITE_OTHER): Payer: Self-pay | Admitting: *Deleted

## 2009-10-13 LAB — CBC WITH DIFFERENTIAL/PLATELET
BASO%: 0.1 % (ref 0.0–2.0)
Basophils Absolute: 0 10*3/uL (ref 0.0–0.1)
EOS%: 1.6 % (ref 0.0–7.0)
HCT: 33.2 % — ABNORMAL LOW (ref 34.8–46.6)
HGB: 10.9 g/dL — ABNORMAL LOW (ref 11.6–15.9)
LYMPH%: 20.1 % (ref 14.0–49.7)
MCH: 31.5 pg (ref 25.1–34.0)
MCHC: 32.8 g/dL (ref 31.5–36.0)
MCV: 96 fL (ref 79.5–101.0)
MONO%: 15.6 % — ABNORMAL HIGH (ref 0.0–14.0)
NEUT%: 62.6 % (ref 38.4–76.8)
Platelets: 173 10*3/uL (ref 145–400)
lymph#: 1.5 10*3/uL (ref 0.9–3.3)

## 2009-10-15 ENCOUNTER — Ambulatory Visit: Payer: Self-pay | Admitting: Vascular Surgery

## 2009-10-15 ENCOUNTER — Encounter: Payer: Self-pay | Admitting: Oncology

## 2009-10-15 ENCOUNTER — Ambulatory Visit
Admission: RE | Admit: 2009-10-15 | Discharge: 2009-10-15 | Payer: Self-pay | Source: Home / Self Care | Admitting: Oncology

## 2009-10-15 LAB — COMPREHENSIVE METABOLIC PANEL
AST: 12 U/L (ref 0–37)
Albumin: 4 g/dL (ref 3.5–5.2)
BUN: 16 mg/dL (ref 6–23)
Calcium: 8.8 mg/dL (ref 8.4–10.5)
Chloride: 100 mEq/L (ref 96–112)
Creatinine, Ser: 0.72 mg/dL (ref 0.40–1.20)
Glucose, Bld: 82 mg/dL (ref 70–99)

## 2009-10-15 LAB — PROTEIN ELECTROPHORESIS, SERUM
Albumin ELP: 54.3 % — ABNORMAL LOW (ref 55.8–66.1)
Alpha-1-Globulin: 6.1 % — ABNORMAL HIGH (ref 2.9–4.9)
Alpha-2-Globulin: 15.3 % — ABNORMAL HIGH (ref 7.1–11.8)
Beta 2: 3.1 % — ABNORMAL LOW (ref 3.2–6.5)
Beta Globulin: 6.3 % (ref 4.7–7.2)
Gamma Globulin: 14.9 % (ref 11.1–18.8)

## 2009-11-01 ENCOUNTER — Ambulatory Visit: Payer: Self-pay | Admitting: Oncology

## 2009-11-02 ENCOUNTER — Encounter: Payer: Self-pay | Admitting: Internal Medicine

## 2009-11-03 LAB — URINALYSIS, MICROSCOPIC - CHCC
Ketones: NEGATIVE mg/dL
Nitrite: NEGATIVE
Protein: <30 mg/dL
pH: 6 (ref 4.6–8.0)

## 2009-11-04 ENCOUNTER — Ambulatory Visit: Payer: Self-pay | Admitting: Internal Medicine

## 2009-11-04 DIAGNOSIS — G629 Polyneuropathy, unspecified: Secondary | ICD-10-CM

## 2009-11-08 ENCOUNTER — Telehealth (INDEPENDENT_AMBULATORY_CARE_PROVIDER_SITE_OTHER): Payer: Self-pay | Admitting: *Deleted

## 2009-11-08 LAB — CONVERTED CEMR LAB
ALT: 20 units/L (ref 0–35)
AST: 19 units/L (ref 0–37)
BUN: 20 mg/dL (ref 6–23)
Cholesterol: 214 mg/dL — ABNORMAL HIGH (ref 0–200)
Direct LDL: 99.5 mg/dL
HDL: 69.3 mg/dL (ref >39.00)
Hgb A1c MFr Bld: 7.1 % — ABNORMAL HIGH (ref 4.6–6.5)
Microalb Creat Ratio: 6.2 mg/g (ref 0.0–30.0)
TSH: 1.23 microintl units/mL (ref 0.35–5.50)
Total Bilirubin: 0.9 mg/dL (ref 0.3–1.2)
Total Protein: 7 g/dL (ref 6.0–8.3)
Triglycerides: 383 mg/dL — ABNORMAL HIGH (ref 0.0–149.0)

## 2009-11-19 ENCOUNTER — Telehealth: Payer: Self-pay | Admitting: Internal Medicine

## 2009-12-06 ENCOUNTER — Encounter: Payer: Self-pay | Admitting: Internal Medicine

## 2009-12-09 ENCOUNTER — Ambulatory Visit: Payer: Self-pay | Admitting: Oncology

## 2009-12-13 LAB — CBC WITH DIFFERENTIAL/PLATELET
Basophils Absolute: 0 10*3/uL (ref 0.0–0.1)
Eosinophils Absolute: 0 10*3/uL (ref 0.0–0.5)
HCT: 33.5 % — ABNORMAL LOW (ref 34.8–46.6)
LYMPH%: 38.7 % (ref 14.0–49.7)
MONO#: 0.7 10*3/uL (ref 0.1–0.9)
NEUT#: 2.8 10*3/uL (ref 1.5–6.5)
NEUT%: 48.8 % (ref 38.4–76.8)
Platelets: 162 10*3/uL (ref 145–400)
WBC: 5.7 10*3/uL (ref 3.9–10.3)

## 2009-12-15 LAB — COMPREHENSIVE METABOLIC PANEL
ALT: 11 U/L (ref 0–35)
BUN: 16 mg/dL (ref 6–23)
CO2: 28 mEq/L (ref 19–32)
Calcium: 9.8 mg/dL (ref 8.4–10.5)
Chloride: 101 mEq/L (ref 96–112)
Creatinine, Ser: 0.77 mg/dL (ref 0.40–1.20)

## 2009-12-15 LAB — PROTEIN ELECTROPHORESIS, SERUM
Alpha-1-Globulin: 4.9 % (ref 2.9–4.9)
Alpha-2-Globulin: 11.1 % (ref 7.1–11.8)
M-Spike, %: 1.19 g/dL
Total Protein, Serum Electrophoresis: 7.5 g/dL (ref 6.0–8.3)

## 2009-12-27 ENCOUNTER — Encounter: Payer: Self-pay | Admitting: Internal Medicine

## 2009-12-28 ENCOUNTER — Encounter: Payer: Self-pay | Admitting: Internal Medicine

## 2009-12-29 ENCOUNTER — Ambulatory Visit: Payer: Self-pay | Admitting: Internal Medicine

## 2010-01-03 LAB — CONVERTED CEMR LAB: Hgb A1c MFr Bld: 6 % (ref 4.6–6.5)

## 2010-01-05 ENCOUNTER — Telehealth (INDEPENDENT_AMBULATORY_CARE_PROVIDER_SITE_OTHER): Payer: Self-pay | Admitting: *Deleted

## 2010-01-10 ENCOUNTER — Encounter: Payer: Self-pay | Admitting: Internal Medicine

## 2010-01-10 ENCOUNTER — Ambulatory Visit: Payer: Self-pay | Admitting: Oncology

## 2010-01-10 LAB — CBC WITH DIFFERENTIAL/PLATELET
Eosinophils Absolute: 0.1 10*3/uL (ref 0.0–0.5)
HGB: 10 g/dL — ABNORMAL LOW (ref 11.6–15.9)
MONO#: 0.6 10*3/uL (ref 0.1–0.9)
NEUT#: 2.2 10*3/uL (ref 1.5–6.5)
Platelets: 133 10*3/uL — ABNORMAL LOW (ref 145–400)
RBC: 3.11 10*6/uL — ABNORMAL LOW (ref 3.70–5.45)
RDW: 13.4 % (ref 11.2–14.5)
WBC: 5 10*3/uL (ref 3.9–10.3)

## 2010-01-12 LAB — PROTEIN ELECTROPHORESIS, SERUM
Albumin ELP: 52.7 % — ABNORMAL LOW (ref 55.8–66.1)
Alpha-2-Globulin: 10.2 % (ref 7.1–11.8)
M-Spike, %: 1.55 g/dL
Total Protein, Serum Electrophoresis: 7.7 g/dL (ref 6.0–8.3)

## 2010-01-12 LAB — BASIC METABOLIC PANEL
Calcium: 9.6 mg/dL (ref 8.4–10.5)
Glucose, Bld: 83 mg/dL (ref 70–99)
Sodium: 141 mEq/L (ref 135–145)

## 2010-01-18 ENCOUNTER — Encounter: Payer: Self-pay | Admitting: Internal Medicine

## 2010-01-18 ENCOUNTER — Ambulatory Visit: Payer: Self-pay | Admitting: Oncology

## 2010-01-18 ENCOUNTER — Ambulatory Visit (HOSPITAL_COMMUNITY): Admission: RE | Admit: 2010-01-18 | Discharge: 2010-01-18 | Payer: Self-pay | Admitting: Oncology

## 2010-01-22 ENCOUNTER — Encounter: Admission: RE | Admit: 2010-01-22 | Discharge: 2010-01-22 | Payer: Self-pay | Admitting: Orthopaedic Surgery

## 2010-02-07 ENCOUNTER — Encounter (HOSPITAL_COMMUNITY): Admission: RE | Admit: 2010-02-07 | Discharge: 2010-05-08 | Payer: Self-pay | Admitting: Oncology

## 2010-02-07 LAB — CBC WITH DIFFERENTIAL/PLATELET
Basophils Absolute: 0 10*3/uL (ref 0.0–0.1)
Eosinophils Absolute: 0.1 10*3/uL (ref 0.0–0.5)
HCT: 23.3 % — ABNORMAL LOW (ref 34.8–46.6)
HGB: 7.8 g/dL — ABNORMAL LOW (ref 11.6–15.9)
MONO#: 0.6 10*3/uL (ref 0.1–0.9)
NEUT%: 43.2 % (ref 38.4–76.8)
WBC: 3.9 10*3/uL (ref 3.9–10.3)
lymph#: 1.6 10*3/uL (ref 0.9–3.3)

## 2010-02-18 ENCOUNTER — Ambulatory Visit: Payer: Self-pay | Admitting: Oncology

## 2010-02-20 ENCOUNTER — Ambulatory Visit: Payer: Self-pay | Admitting: Oncology

## 2010-02-21 ENCOUNTER — Encounter: Payer: Self-pay | Admitting: Internal Medicine

## 2010-02-21 ENCOUNTER — Other Ambulatory Visit: Payer: Self-pay | Admitting: Oncology

## 2010-02-21 LAB — CBC WITH DIFFERENTIAL/PLATELET
Eosinophils Absolute: 0 10*3/uL (ref 0.0–0.5)
HCT: 26.9 % — ABNORMAL LOW (ref 34.8–46.6)
LYMPH%: 47.5 % (ref 14.0–49.7)
MONO#: 0.5 10*3/uL (ref 0.1–0.9)
NEUT#: 1.7 10*3/uL (ref 1.5–6.5)
NEUT%: 38.6 % (ref 38.4–76.8)
Platelets: 102 10*3/uL — ABNORMAL LOW (ref 145–400)
WBC: 4.3 10*3/uL (ref 3.9–10.3)

## 2010-02-23 LAB — IGM: IgM, Serum: 5210 mg/dL — ABNORMAL HIGH (ref 60–263)

## 2010-02-23 LAB — BASIC METABOLIC PANEL
CO2: 26 mEq/L (ref 19–32)
Calcium: 9.8 mg/dL (ref 8.4–10.5)
Chloride: 102 mEq/L (ref 96–112)
Creatinine, Ser: 0.99 mg/dL (ref 0.40–1.20)
Glucose, Bld: 133 mg/dL — ABNORMAL HIGH (ref 70–99)
Sodium: 141 mEq/L (ref 135–145)

## 2010-02-23 LAB — PROTEIN ELECTROPHORESIS, SERUM
Alpha-2-Globulin: 7.1 % (ref 7.1–11.8)
Beta 2: 3.1 % — ABNORMAL LOW (ref 3.2–6.5)
Gamma Globulin: 36.9 % — ABNORMAL HIGH (ref 11.1–18.8)
M-Spike, %: 3.08 g/dL

## 2010-03-02 ENCOUNTER — Encounter: Payer: Self-pay | Admitting: Internal Medicine

## 2010-03-02 LAB — CBC WITH DIFFERENTIAL/PLATELET
BASO%: 1 % (ref 0.0–2.0)
Basophils Absolute: 0.1 10*3/uL (ref 0.0–0.1)
EOS%: 0.3 % (ref 0.0–7.0)
HCT: 25.6 % — ABNORMAL LOW (ref 34.8–46.6)
LYMPH%: 49.1 % (ref 14.0–49.7)
MCH: 33.3 pg (ref 25.1–34.0)
MCHC: 34.4 g/dL (ref 31.5–36.0)
MCV: 96.7 fL (ref 79.5–101.0)
MONO%: 15.2 % — ABNORMAL HIGH (ref 0.0–14.0)
NEUT%: 34.4 % — ABNORMAL LOW (ref 38.4–76.8)
Platelets: 85 10*3/uL — ABNORMAL LOW (ref 145–400)
lymph#: 2.5 10*3/uL (ref 0.9–3.3)

## 2010-03-04 ENCOUNTER — Encounter: Payer: Self-pay | Admitting: Internal Medicine

## 2010-03-04 ENCOUNTER — Emergency Department (HOSPITAL_COMMUNITY): Admission: EM | Admit: 2010-03-04 | Discharge: 2010-03-05 | Payer: Self-pay | Admitting: Emergency Medicine

## 2010-03-08 ENCOUNTER — Ambulatory Visit: Payer: Self-pay | Admitting: Internal Medicine

## 2010-03-08 DIAGNOSIS — R109 Unspecified abdominal pain: Secondary | ICD-10-CM | POA: Insufficient documentation

## 2010-03-09 LAB — CONVERTED CEMR LAB
Eosinophils Absolute: 0 10*3/uL (ref 0.0–0.7)
Eosinophils Relative: 0 % (ref 0–5)
HCT: 34 % — ABNORMAL LOW (ref 36.0–46.0)
Hemoglobin: 10.7 g/dL — ABNORMAL LOW (ref 12.0–15.0)
Lymphocytes Relative: 50 % — ABNORMAL HIGH (ref 12–46)
Lymphs Abs: 3.5 10*3/uL (ref 0.7–4.0)
MCV: 98.8 fL (ref 78.0–100.0)
Monocytes Absolute: 1.3 10*3/uL — ABNORMAL HIGH (ref 0.1–1.0)
Potassium: 4 meq/L (ref 3.5–5.1)
Pro B Natriuretic peptide (BNP): 85.4 pg/mL (ref 0.0–100.0)
RDW: 15.5 % (ref 11.5–15.5)
WBC: 7.1 10*3/uL (ref 4.0–10.5)

## 2010-03-10 ENCOUNTER — Encounter: Payer: Self-pay | Admitting: Internal Medicine

## 2010-03-10 LAB — CBC WITH DIFFERENTIAL/PLATELET
BASO%: 0.7 % (ref 0.0–2.0)
Eosinophils Absolute: 0 10*3/uL (ref 0.0–0.5)
LYMPH%: 44.9 % (ref 14.0–49.7)
MCHC: 34 g/dL (ref 31.5–36.0)
MCV: 96.3 fL (ref 79.5–101.0)
MONO%: 12.4 % (ref 0.0–14.0)
Platelets: 63 10*3/uL — ABNORMAL LOW (ref 145–400)
RBC: 3.17 10*6/uL — ABNORMAL LOW (ref 3.70–5.45)

## 2010-03-16 ENCOUNTER — Encounter: Admission: RE | Admit: 2010-03-16 | Discharge: 2010-03-16 | Payer: Self-pay | Admitting: Orthopaedic Surgery

## 2010-03-17 LAB — CBC WITH DIFFERENTIAL/PLATELET
BASO%: 0.7 % (ref 0.0–2.0)
EOS%: 0.3 % (ref 0.0–7.0)
LYMPH%: 45.6 % (ref 14.0–49.7)
MCHC: 31.4 g/dL — ABNORMAL LOW (ref 31.5–36.0)
MONO#: 0.9 10*3/uL (ref 0.1–0.9)
MONO%: 13.7 % (ref 0.0–14.0)
Platelets: 45 10*3/uL — ABNORMAL LOW (ref 145–400)
RBC: 3.42 10*6/uL — ABNORMAL LOW (ref 3.70–5.45)
WBC: 6.9 10*3/uL (ref 3.9–10.3)
nRBC: 0 % (ref 0–0)

## 2010-03-22 ENCOUNTER — Ambulatory Visit: Payer: Self-pay | Admitting: Oncology

## 2010-03-24 ENCOUNTER — Encounter: Payer: Self-pay | Admitting: Internal Medicine

## 2010-03-24 LAB — COMPREHENSIVE METABOLIC PANEL
AST: 20 U/L (ref 0–37)
Albumin: 3.4 g/dL — ABNORMAL LOW (ref 3.5–5.2)
Alkaline Phosphatase: 37 U/L — ABNORMAL LOW (ref 39–117)
Potassium: 3.9 mEq/L (ref 3.5–5.3)
Sodium: 136 mEq/L (ref 135–145)
Total Bilirubin: 1.2 mg/dL (ref 0.3–1.2)
Total Protein: 11.7 g/dL — ABNORMAL HIGH (ref 6.0–8.3)

## 2010-03-24 LAB — CBC WITH DIFFERENTIAL/PLATELET
BASO%: 0.4 % (ref 0.0–2.0)
EOS%: 0.2 % (ref 0.0–7.0)
MCH: 33 pg (ref 25.1–34.0)
MCHC: 34.7 g/dL (ref 31.5–36.0)
MCV: 95 fL (ref 79.5–101.0)
MONO%: 8 % (ref 0.0–14.0)
NEUT#: 1.9 10*3/uL (ref 1.5–6.5)
RBC: 2.9 10*6/uL — ABNORMAL LOW (ref 3.70–5.45)
RDW: 15.6 % — ABNORMAL HIGH (ref 11.2–14.5)

## 2010-03-25 ENCOUNTER — Inpatient Hospital Stay (HOSPITAL_COMMUNITY): Admission: AD | Admit: 2010-03-25 | Discharge: 2010-03-29 | Payer: Self-pay | Admitting: Oncology

## 2010-03-25 ENCOUNTER — Ambulatory Visit: Payer: Self-pay | Admitting: Cardiology

## 2010-03-25 ENCOUNTER — Ambulatory Visit: Payer: Self-pay | Admitting: Oncology

## 2010-03-26 ENCOUNTER — Encounter: Payer: Self-pay | Admitting: Oncology

## 2010-03-28 ENCOUNTER — Ambulatory Visit: Payer: Self-pay | Admitting: Internal Medicine

## 2010-03-28 ENCOUNTER — Encounter: Payer: Self-pay | Admitting: Cardiology

## 2010-03-31 ENCOUNTER — Encounter: Payer: Self-pay | Admitting: Internal Medicine

## 2010-03-31 LAB — CBC WITH DIFFERENTIAL/PLATELET
BASO%: 0.5 % (ref 0.0–2.0)
Eosinophils Absolute: 0 10*3/uL (ref 0.0–0.5)
MCHC: 34.7 g/dL (ref 31.5–36.0)
MCV: 93.8 fL (ref 79.5–101.0)
MONO#: 0.6 10*3/uL (ref 0.1–0.9)
MONO%: 14 % (ref 0.0–14.0)
NEUT#: 1.5 10*3/uL (ref 1.5–6.5)
RBC: 3.53 10*6/uL — ABNORMAL LOW (ref 3.70–5.45)
RDW: 15.5 % — ABNORMAL HIGH (ref 11.2–14.5)
WBC: 4.2 10*3/uL (ref 3.9–10.3)

## 2010-03-31 LAB — BASIC METABOLIC PANEL
BUN: 17 mg/dL (ref 6–23)
Creatinine, Ser: 1.04 mg/dL (ref 0.40–1.20)
Glucose, Bld: 86 mg/dL (ref 70–99)

## 2010-04-07 LAB — CBC WITH DIFFERENTIAL/PLATELET
Basophils Absolute: 0.1 10*3/uL (ref 0.0–0.1)
Eosinophils Absolute: 0 10*3/uL (ref 0.0–0.5)
HGB: 11.2 g/dL — ABNORMAL LOW (ref 11.6–15.9)
MCV: 94.7 fL (ref 79.5–101.0)
MONO%: 11.1 % (ref 0.0–14.0)
NEUT#: 1.1 10*3/uL — ABNORMAL LOW (ref 1.5–6.5)
RDW: 15.2 % — ABNORMAL HIGH (ref 11.2–14.5)
lymph#: 2.4 10*3/uL (ref 0.9–3.3)

## 2010-04-07 LAB — TECHNOLOGIST REVIEW

## 2010-04-20 ENCOUNTER — Ambulatory Visit: Payer: Self-pay | Admitting: Cardiology

## 2010-04-20 ENCOUNTER — Encounter (INDEPENDENT_AMBULATORY_CARE_PROVIDER_SITE_OTHER): Payer: Self-pay | Admitting: *Deleted

## 2010-04-20 DIAGNOSIS — I5032 Chronic diastolic (congestive) heart failure: Secondary | ICD-10-CM

## 2010-04-20 DIAGNOSIS — R0989 Other specified symptoms and signs involving the circulatory and respiratory systems: Secondary | ICD-10-CM | POA: Insufficient documentation

## 2010-04-21 ENCOUNTER — Ambulatory Visit: Payer: Self-pay | Admitting: Oncology

## 2010-04-21 LAB — CBC WITH DIFFERENTIAL/PLATELET
BASO%: 0.3 % (ref 0.0–2.0)
Basophils Absolute: 0 10*3/uL (ref 0.0–0.1)
Eosinophils Absolute: 0 10*3/uL (ref 0.0–0.5)
HCT: 29.9 % — ABNORMAL LOW (ref 34.8–46.6)
HGB: 9.8 g/dL — ABNORMAL LOW (ref 11.6–15.9)
LYMPH%: 64.2 % — ABNORMAL HIGH (ref 14.0–49.7)
MCHC: 32.8 g/dL (ref 31.5–36.0)
MONO#: 0.4 10*3/uL (ref 0.1–0.9)
NEUT%: 23.6 % — ABNORMAL LOW (ref 38.4–76.8)
Platelets: 83 10*3/uL — ABNORMAL LOW (ref 145–400)
WBC: 3.7 10*3/uL — ABNORMAL LOW (ref 3.9–10.3)

## 2010-04-27 ENCOUNTER — Telehealth (INDEPENDENT_AMBULATORY_CARE_PROVIDER_SITE_OTHER): Payer: Self-pay | Admitting: *Deleted

## 2010-04-28 LAB — CBC WITH DIFFERENTIAL/PLATELET
Eosinophils Absolute: 0 10*3/uL (ref 0.0–0.5)
HCT: 29.8 % — ABNORMAL LOW (ref 34.8–46.6)
LYMPH%: 65.9 % — ABNORMAL HIGH (ref 14.0–49.7)
MCHC: 33.6 g/dL (ref 31.5–36.0)
MCV: 94.6 fL (ref 79.5–101.0)
MONO#: 0.7 10*3/uL (ref 0.1–0.9)
NEUT#: 1.3 10*3/uL — ABNORMAL LOW (ref 1.5–6.5)
NEUT%: 22 % — ABNORMAL LOW (ref 38.4–76.8)
Platelets: 108 10*3/uL — ABNORMAL LOW (ref 145–400)
WBC: 5.9 10*3/uL (ref 3.9–10.3)

## 2010-05-02 LAB — IGM: IgM, Serum: 3410 mg/dL — ABNORMAL HIGH (ref 60–263)

## 2010-05-02 LAB — COMPREHENSIVE METABOLIC PANEL
BUN: 12 mg/dL (ref 6–23)
CO2: 26 mEq/L (ref 19–32)
Creatinine, Ser: 0.83 mg/dL (ref 0.40–1.20)
Glucose, Bld: 117 mg/dL — ABNORMAL HIGH (ref 70–99)
Total Bilirubin: 1 mg/dL (ref 0.3–1.2)

## 2010-05-02 LAB — PROTEIN ELECTROPHORESIS, SERUM
Alpha-1-Globulin: 4 % (ref 2.9–4.9)
Alpha-2-Globulin: 9.5 % (ref 7.1–11.8)
Beta Globulin: 4.4 % — ABNORMAL LOW (ref 4.7–7.2)
Gamma Globulin: 30.6 % — ABNORMAL HIGH (ref 11.1–18.8)

## 2010-05-05 LAB — CBC WITH DIFFERENTIAL/PLATELET
BASO%: 1.2 % (ref 0.0–2.0)
EOS%: 0.5 % (ref 0.0–7.0)
HCT: 29.3 % — ABNORMAL LOW (ref 34.8–46.6)
LYMPH%: 55.4 % — ABNORMAL HIGH (ref 14.0–49.7)
MCH: 31.9 pg (ref 25.1–34.0)
MCHC: 33.1 g/dL (ref 31.5–36.0)
MONO%: 14.5 % — ABNORMAL HIGH (ref 0.0–14.0)
NEUT%: 28.4 % — ABNORMAL LOW (ref 38.4–76.8)
Platelets: 101 10*3/uL — ABNORMAL LOW (ref 145–400)

## 2010-05-12 LAB — CBC WITH DIFFERENTIAL/PLATELET
Basophils Absolute: 0 10*3/uL (ref 0.0–0.1)
EOS%: 0.3 % (ref 0.0–7.0)
Eosinophils Absolute: 0 10*3/uL (ref 0.0–0.5)
HCT: 28.2 % — ABNORMAL LOW (ref 34.8–46.6)
HGB: 9.7 g/dL — ABNORMAL LOW (ref 11.6–15.9)
MONO#: 0.5 10*3/uL (ref 0.1–0.9)
NEUT#: 1.4 10*3/uL — ABNORMAL LOW (ref 1.5–6.5)
RDW: 17.8 % — ABNORMAL HIGH (ref 11.2–14.5)
WBC: 3.4 10*3/uL — ABNORMAL LOW (ref 3.9–10.3)
lymph#: 1.5 10*3/uL (ref 0.9–3.3)

## 2010-05-12 LAB — COMPREHENSIVE METABOLIC PANEL
Albumin: 3.7 g/dL (ref 3.5–5.2)
BUN: 10 mg/dL (ref 6–23)
Calcium: 9 mg/dL (ref 8.4–10.5)
Chloride: 103 mEq/L (ref 96–112)
Creatinine, Ser: 0.74 mg/dL (ref 0.40–1.20)
Glucose, Bld: 146 mg/dL — ABNORMAL HIGH (ref 70–99)
Potassium: 3.7 mEq/L (ref 3.5–5.3)

## 2010-05-25 ENCOUNTER — Ambulatory Visit: Payer: Self-pay | Admitting: Oncology

## 2010-05-26 ENCOUNTER — Encounter: Payer: Self-pay | Admitting: Internal Medicine

## 2010-05-26 LAB — CBC WITH DIFFERENTIAL/PLATELET
Basophils Absolute: 0 10*3/uL (ref 0.0–0.1)
Eosinophils Absolute: 0 10*3/uL (ref 0.0–0.5)
HCT: 27.7 % — ABNORMAL LOW (ref 34.8–46.6)
HGB: 9.3 g/dL — ABNORMAL LOW (ref 11.6–15.9)
MCV: 98.2 fL (ref 79.5–101.0)
MONO%: 18.3 % — ABNORMAL HIGH (ref 0.0–14.0)
NEUT#: 0.9 10*3/uL — ABNORMAL LOW (ref 1.5–6.5)
NEUT%: 37 % — ABNORMAL LOW (ref 38.4–76.8)
Platelets: 144 10*3/uL — ABNORMAL LOW (ref 145–400)
RDW: 17.3 % — ABNORMAL HIGH (ref 11.2–14.5)

## 2010-06-01 LAB — CBC WITH DIFFERENTIAL/PLATELET
Basophils Absolute: 0 10*3/uL (ref 0.0–0.1)
Eosinophils Absolute: 0 10*3/uL (ref 0.0–0.5)
HGB: 9 g/dL — ABNORMAL LOW (ref 11.6–15.9)
LYMPH%: 51.6 % — ABNORMAL HIGH (ref 14.0–49.7)
MCV: 99.3 fL (ref 79.5–101.0)
MONO%: 19 % — ABNORMAL HIGH (ref 0.0–14.0)
NEUT#: 0.9 10*3/uL — ABNORMAL LOW (ref 1.5–6.5)
Platelets: 152 10*3/uL (ref 145–400)
RBC: 2.54 10*6/uL — ABNORMAL LOW (ref 3.70–5.45)

## 2010-06-03 LAB — COMPREHENSIVE METABOLIC PANEL
Alkaline Phosphatase: 57 U/L (ref 39–117)
BUN: 11 mg/dL (ref 6–23)
Glucose, Bld: 70 mg/dL (ref 70–99)
Total Bilirubin: 1 mg/dL (ref 0.3–1.2)

## 2010-06-03 LAB — PROTEIN ELECTROPHORESIS, SERUM
Albumin ELP: 59.4 % (ref 55.8–66.1)
Alpha-2-Globulin: 8.8 % (ref 7.1–11.8)
Beta Globulin: 5.6 % (ref 4.7–7.2)
Total Protein, Serum Electrophoresis: 7.2 g/dL (ref 6.0–8.3)

## 2010-06-08 LAB — CBC WITH DIFFERENTIAL/PLATELET
Basophils Absolute: 0 10*3/uL (ref 0.0–0.1)
Eosinophils Absolute: 0 10*3/uL (ref 0.0–0.5)
HGB: 9.8 g/dL — ABNORMAL LOW (ref 11.6–15.9)
LYMPH%: 44.2 % (ref 14.0–49.7)
MCV: 99.7 fL (ref 79.5–101.0)
MONO%: 14.5 % — ABNORMAL HIGH (ref 0.0–14.0)
NEUT#: 1.4 10*3/uL — ABNORMAL LOW (ref 1.5–6.5)
Platelets: 147 10*3/uL (ref 145–400)
RDW: 16.2 % — ABNORMAL HIGH (ref 11.2–14.5)

## 2010-06-15 LAB — CBC WITH DIFFERENTIAL/PLATELET
Basophils Absolute: 0 10*3/uL (ref 0.0–0.1)
EOS%: 0.6 % (ref 0.0–7.0)
Eosinophils Absolute: 0 10*3/uL (ref 0.0–0.5)
MCH: 34.3 pg — ABNORMAL HIGH (ref 25.1–34.0)
MCHC: 33.8 g/dL (ref 31.5–36.0)
MCV: 101.4 fL — ABNORMAL HIGH (ref 79.5–101.0)
MONO#: 0.7 10*3/uL (ref 0.1–0.9)
NEUT%: 47.7 % (ref 38.4–76.8)
Platelets: 126 10*3/uL — ABNORMAL LOW (ref 145–400)
RBC: 2.86 10*6/uL — ABNORMAL LOW (ref 3.70–5.45)
RDW: 15.6 % — ABNORMAL HIGH (ref 11.2–14.5)
lymph#: 1.9 10*3/uL (ref 0.9–3.3)

## 2010-06-24 ENCOUNTER — Ambulatory Visit: Payer: Self-pay | Admitting: Oncology

## 2010-06-27 ENCOUNTER — Ambulatory Visit: Payer: Self-pay | Admitting: Cardiology

## 2010-06-28 ENCOUNTER — Encounter: Payer: Self-pay | Admitting: Internal Medicine

## 2010-06-28 LAB — CBC WITH DIFFERENTIAL/PLATELET
Basophils Absolute: 0 10*3/uL (ref 0.0–0.1)
Eosinophils Absolute: 0 10*3/uL (ref 0.0–0.5)
HCT: 29.2 % — ABNORMAL LOW (ref 34.8–46.6)
HGB: 10.1 g/dL — ABNORMAL LOW (ref 11.6–15.9)
LYMPH%: 42.4 % (ref 14.0–49.7)
MCV: 103 fL — ABNORMAL HIGH (ref 79.5–101.0)
MONO#: 0.5 10*3/uL (ref 0.1–0.9)
NEUT#: 1.3 10*3/uL — ABNORMAL LOW (ref 1.5–6.5)
Platelets: 165 10*3/uL (ref 145–400)
RBC: 2.84 10*6/uL — ABNORMAL LOW (ref 3.70–5.45)
WBC: 3 10*3/uL — ABNORMAL LOW (ref 3.9–10.3)

## 2010-07-05 LAB — CBC WITH DIFFERENTIAL/PLATELET
BASO%: 0.3 % (ref 0.0–2.0)
Eosinophils Absolute: 0 10*3/uL (ref 0.0–0.5)
HCT: 28.5 % — ABNORMAL LOW (ref 34.8–46.6)
LYMPH%: 38.1 % (ref 14.0–49.7)
MCHC: 33.3 g/dL (ref 31.5–36.0)
MCV: 103.6 fL — ABNORMAL HIGH (ref 79.5–101.0)
MONO#: 0.5 10*3/uL (ref 0.1–0.9)
MONO%: 15.1 % — ABNORMAL HIGH (ref 0.0–14.0)
NEUT%: 46.2 % (ref 38.4–76.8)
Platelets: 126 10*3/uL — ABNORMAL LOW (ref 145–400)
WBC: 3 10*3/uL — ABNORMAL LOW (ref 3.9–10.3)

## 2010-07-12 LAB — CBC WITH DIFFERENTIAL/PLATELET
Basophils Absolute: 0 10*3/uL (ref 0.0–0.1)
Eosinophils Absolute: 0 10*3/uL (ref 0.0–0.5)
HCT: 29.7 % — ABNORMAL LOW (ref 34.8–46.6)
HGB: 10 g/dL — ABNORMAL LOW (ref 11.6–15.9)
MCV: 103.8 fL — ABNORMAL HIGH (ref 79.5–101.0)
NEUT#: 1.5 10*3/uL (ref 1.5–6.5)
NEUT%: 45.9 % (ref 38.4–76.8)
RDW: 13.7 % (ref 11.2–14.5)
lymph#: 1.3 10*3/uL (ref 0.9–3.3)

## 2010-07-25 ENCOUNTER — Ambulatory Visit: Payer: Self-pay | Admitting: Oncology

## 2010-07-26 ENCOUNTER — Encounter: Payer: Self-pay | Admitting: Internal Medicine

## 2010-07-26 LAB — CBC WITH DIFFERENTIAL/PLATELET
Basophils Absolute: 0 10*3/uL (ref 0.0–0.1)
Eosinophils Absolute: 0 10*3/uL (ref 0.0–0.5)
HGB: 10.1 g/dL — ABNORMAL LOW (ref 11.6–15.9)
LYMPH%: 15.1 % (ref 14.0–49.7)
MCH: 37.2 pg — ABNORMAL HIGH (ref 25.1–34.0)
MCV: 106.4 fL — ABNORMAL HIGH (ref 79.5–101.0)
MONO%: 10.7 % (ref 0.0–14.0)
NEUT#: 3.2 10*3/uL (ref 1.5–6.5)
NEUT%: 73.1 % (ref 38.4–76.8)
Platelets: 150 10*3/uL (ref 145–400)

## 2010-07-28 LAB — PROTEIN ELECTROPHORESIS, SERUM
Beta Globulin: 6.1 % (ref 4.7–7.2)
M-Spike, %: 0.45 g/dL
Total Protein, Serum Electrophoresis: 5.9 g/dL — ABNORMAL LOW (ref 6.0–8.3)

## 2010-07-28 LAB — BASIC METABOLIC PANEL
BUN: 10 mg/dL (ref 6–23)
Calcium: 9.3 mg/dL (ref 8.4–10.5)
Creatinine, Ser: 0.72 mg/dL (ref 0.40–1.20)
Glucose, Bld: 143 mg/dL — ABNORMAL HIGH (ref 70–99)

## 2010-07-28 LAB — IGM: IgM, Serum: 567 mg/dL — ABNORMAL HIGH (ref 60–263)

## 2010-08-02 LAB — CBC WITH DIFFERENTIAL/PLATELET
BASO%: 0.3 % (ref 0.0–2.0)
LYMPH%: 25.8 % (ref 14.0–49.7)
MCHC: 35.3 g/dL (ref 31.5–36.0)
MONO#: 0.5 10*3/uL (ref 0.1–0.9)
Platelets: 124 10*3/uL — ABNORMAL LOW (ref 145–400)
RBC: 2.93 10*6/uL — ABNORMAL LOW (ref 3.70–5.45)
RDW: 13 % (ref 11.2–14.5)
WBC: 3.2 10*3/uL — ABNORMAL LOW (ref 3.9–10.3)

## 2010-08-02 LAB — BASIC METABOLIC PANEL
CO2: 28 mEq/L (ref 19–32)
Calcium: 9.2 mg/dL (ref 8.4–10.5)
Sodium: 141 mEq/L (ref 135–145)

## 2010-08-08 ENCOUNTER — Telehealth (INDEPENDENT_AMBULATORY_CARE_PROVIDER_SITE_OTHER): Payer: Self-pay | Admitting: *Deleted

## 2010-08-09 LAB — BASIC METABOLIC PANEL
CO2: 27 mEq/L (ref 19–32)
Chloride: 103 mEq/L (ref 96–112)
Glucose, Bld: 123 mg/dL — ABNORMAL HIGH (ref 70–99)
Potassium: 3.6 mEq/L (ref 3.5–5.3)
Sodium: 141 mEq/L (ref 135–145)

## 2010-08-09 LAB — CBC WITH DIFFERENTIAL/PLATELET
Eosinophils Absolute: 0 10*3/uL (ref 0.0–0.5)
HGB: 10.1 g/dL — ABNORMAL LOW (ref 11.6–15.9)
MONO#: 0.5 10*3/uL (ref 0.1–0.9)
NEUT#: 1.5 10*3/uL (ref 1.5–6.5)
RBC: 2.74 10*6/uL — ABNORMAL LOW (ref 3.70–5.45)
RDW: 13.6 % (ref 11.2–14.5)
WBC: 2.9 10*3/uL — ABNORMAL LOW (ref 3.9–10.3)

## 2010-08-11 ENCOUNTER — Ambulatory Visit: Payer: Self-pay | Admitting: Internal Medicine

## 2010-08-23 ENCOUNTER — Encounter: Payer: Self-pay | Admitting: Internal Medicine

## 2010-08-23 LAB — COMPREHENSIVE METABOLIC PANEL
ALT: 19 U/L (ref 0–35)
CO2: 30 mEq/L (ref 19–32)
Creatinine, Ser: 0.77 mg/dL (ref 0.40–1.20)
Total Bilirubin: 0.8 mg/dL (ref 0.3–1.2)

## 2010-08-23 LAB — CBC WITH DIFFERENTIAL/PLATELET
BASO%: 0.3 % (ref 0.0–2.0)
HCT: 30.2 % — ABNORMAL LOW (ref 34.8–46.6)
LYMPH%: 29.9 % (ref 14.0–49.7)
MCH: 34.6 pg — ABNORMAL HIGH (ref 25.1–34.0)
MCHC: 33.4 g/dL (ref 31.5–36.0)
MCV: 103.4 fL — ABNORMAL HIGH (ref 79.5–101.0)
MONO#: 0.7 10*3/uL (ref 0.1–0.9)
NEUT%: 47.3 % (ref 38.4–76.8)
Platelets: 135 10*3/uL — ABNORMAL LOW (ref 145–400)

## 2010-08-26 ENCOUNTER — Ambulatory Visit: Payer: Self-pay | Admitting: Oncology

## 2010-08-26 LAB — PROTEIN ELECTROPHORESIS, SERUM
Albumin ELP: 65.8 % (ref 55.8–66.1)
Alpha-2-Globulin: 9.9 % (ref 7.1–11.8)
Beta Globulin: 7 % (ref 4.7–7.2)
M-Spike, %: 0.4 g/dL
Total Protein, Serum Electrophoresis: 6.8 g/dL (ref 6.0–8.3)

## 2010-08-30 LAB — CBC WITH DIFFERENTIAL/PLATELET
Basophils Absolute: 0 10*3/uL (ref 0.0–0.1)
Eosinophils Absolute: 0.1 10*3/uL (ref 0.0–0.5)
HCT: 29.8 % — ABNORMAL LOW (ref 34.8–46.6)
HGB: 10.6 g/dL — ABNORMAL LOW (ref 11.6–15.9)
LYMPH%: 36.7 % (ref 14.0–49.7)
MCV: 101 fL (ref 79.5–101.0)
MONO%: 18.5 % — ABNORMAL HIGH (ref 0.0–14.0)
NEUT#: 1.5 10*3/uL (ref 1.5–6.5)
NEUT%: 42.5 % (ref 38.4–76.8)
Platelets: 117 10*3/uL — ABNORMAL LOW (ref 145–400)

## 2010-09-06 ENCOUNTER — Encounter: Payer: Self-pay | Admitting: Internal Medicine

## 2010-09-06 LAB — CBC WITH DIFFERENTIAL/PLATELET
BASO%: 0.7 % (ref 0.0–2.0)
Basophils Absolute: 0 10*3/uL (ref 0.0–0.1)
EOS%: 1.4 % (ref 0.0–7.0)
HCT: 30.8 % — ABNORMAL LOW (ref 34.8–46.6)
LYMPH%: 22.2 % (ref 14.0–49.7)
MCH: 34.6 pg — ABNORMAL HIGH (ref 25.1–34.0)
MCHC: 33.1 g/dL (ref 31.5–36.0)
NEUT%: 58.8 % (ref 38.4–76.8)
Platelets: 116 10*3/uL — ABNORMAL LOW (ref 145–400)

## 2010-09-09 ENCOUNTER — Ambulatory Visit: Payer: Self-pay | Admitting: Internal Medicine

## 2010-09-12 ENCOUNTER — Encounter: Payer: Self-pay | Admitting: Internal Medicine

## 2010-09-12 LAB — CONVERTED CEMR LAB
Basophils Absolute: 0 10*3/uL (ref 0.0–0.1)
CO2: 31 meq/L (ref 19–32)
Chloride: 105 meq/L (ref 96–112)
Eosinophils Absolute: 0 10*3/uL (ref 0.0–0.7)
Hemoglobin: 11.5 g/dL — ABNORMAL LOW (ref 12.0–15.0)
Lymphocytes Relative: 14.9 % (ref 12.0–46.0)
MCHC: 34.2 g/dL (ref 30.0–36.0)
Monocytes Relative: 16.3 % — ABNORMAL HIGH (ref 3.0–12.0)
Neutrophils Relative %: 68.1 % (ref 43.0–77.0)
Platelets: 121 10*3/uL — ABNORMAL LOW (ref 150.0–400.0)
Potassium: 4.8 meq/L (ref 3.5–5.1)
RDW: 14 % (ref 11.5–14.6)
Sodium: 145 meq/L (ref 135–145)

## 2010-09-20 ENCOUNTER — Encounter: Payer: Self-pay | Admitting: Internal Medicine

## 2010-09-20 LAB — CBC WITH DIFFERENTIAL/PLATELET
Eosinophils Absolute: 0.1 10*3/uL (ref 0.0–0.5)
LYMPH%: 36.5 % (ref 14.0–49.7)
MONO#: 0.6 10*3/uL (ref 0.1–0.9)
NEUT#: 1.5 10*3/uL (ref 1.5–6.5)
Platelets: 176 10*3/uL (ref 145–400)
RBC: 2.92 10*6/uL — ABNORMAL LOW (ref 3.70–5.45)
RDW: 13.3 % (ref 11.2–14.5)
WBC: 3.3 10*3/uL — ABNORMAL LOW (ref 3.9–10.3)
lymph#: 1.2 10*3/uL (ref 0.9–3.3)

## 2010-09-22 LAB — PROTEIN ELECTROPHORESIS, SERUM
Albumin ELP: 63.4 % (ref 55.8–66.1)
M-Spike, %: 0.39 g/dL
Total Protein, Serum Electrophoresis: 6.5 g/dL (ref 6.0–8.3)

## 2010-09-22 LAB — COMPREHENSIVE METABOLIC PANEL
Albumin: 4.2 g/dL (ref 3.5–5.2)
CO2: 28 mEq/L (ref 19–32)
Calcium: 9.1 mg/dL (ref 8.4–10.5)
Chloride: 102 mEq/L (ref 96–112)
Glucose, Bld: 103 mg/dL — ABNORMAL HIGH (ref 70–99)
Potassium: 4 mEq/L (ref 3.5–5.3)
Sodium: 140 mEq/L (ref 135–145)
Total Protein: 6.5 g/dL (ref 6.0–8.3)

## 2010-09-26 ENCOUNTER — Ambulatory Visit: Payer: Self-pay | Admitting: Oncology

## 2010-09-27 LAB — CBC WITH DIFFERENTIAL/PLATELET
Basophils Absolute: 0 10*3/uL (ref 0.0–0.1)
EOS%: 1.4 % (ref 0.0–7.0)
Eosinophils Absolute: 0.1 10*3/uL (ref 0.0–0.5)
HGB: 10.1 g/dL — ABNORMAL LOW (ref 11.6–15.9)
NEUT#: 1.3 10*3/uL — ABNORMAL LOW (ref 1.5–6.5)
RBC: 2.93 10*6/uL — ABNORMAL LOW (ref 3.70–5.45)
RDW: 13.5 % (ref 11.2–14.5)
lymph#: 1.6 10*3/uL (ref 0.9–3.3)
nRBC: 0 % (ref 0–0)

## 2010-10-06 LAB — CBC WITH DIFFERENTIAL/PLATELET
BASO%: 0.2 % (ref 0.0–2.0)
Basophils Absolute: 0 10*3/uL (ref 0.0–0.1)
EOS%: 0.8 % (ref 0.0–7.0)
HGB: 11.2 g/dL — ABNORMAL LOW (ref 11.6–15.9)
MCH: 34.9 pg — ABNORMAL HIGH (ref 25.1–34.0)
MCHC: 33.9 g/dL (ref 31.5–36.0)
MONO#: 0.9 10*3/uL (ref 0.1–0.9)
RDW: 13.6 % (ref 11.2–14.5)
WBC: 5.1 10*3/uL (ref 3.9–10.3)
lymph#: 1.1 10*3/uL (ref 0.9–3.3)

## 2010-10-11 ENCOUNTER — Ambulatory Visit
Admission: RE | Admit: 2010-10-11 | Discharge: 2010-10-11 | Payer: Self-pay | Source: Home / Self Care | Attending: Internal Medicine | Admitting: Internal Medicine

## 2010-10-11 DIAGNOSIS — J019 Acute sinusitis, unspecified: Secondary | ICD-10-CM | POA: Insufficient documentation

## 2010-10-17 ENCOUNTER — Telehealth: Payer: Self-pay | Admitting: Internal Medicine

## 2010-10-18 LAB — CBC WITH DIFFERENTIAL/PLATELET
BASO%: 0 % (ref 0.0–2.0)
Basophils Absolute: 0 10e3/uL (ref 0.0–0.1)
EOS%: 1.4 % (ref 0.0–7.0)
Eosinophils Absolute: 0 10e3/uL (ref 0.0–0.5)
HCT: 29.9 % — ABNORMAL LOW (ref 34.8–46.6)
HGB: 10.3 g/dL — ABNORMAL LOW (ref 11.6–15.9)
LYMPH%: 22.1 % (ref 14.0–49.7)
MCH: 34.9 pg — ABNORMAL HIGH (ref 25.1–34.0)
MCHC: 34.4 g/dL (ref 31.5–36.0)
MCV: 101.4 fL — ABNORMAL HIGH (ref 79.5–101.0)
MONO#: 0.4 10e3/uL (ref 0.1–0.9)
MONO%: 13.5 % (ref 0.0–14.0)
NEUT#: 1.8 10e3/uL (ref 1.5–6.5)
NEUT%: 63 % (ref 38.4–76.8)
Platelets: 162 10e3/uL (ref 145–400)
RBC: 2.95 10e6/uL — ABNORMAL LOW (ref 3.70–5.45)
RDW: 13.4 % (ref 11.2–14.5)
WBC: 2.9 10e3/uL — ABNORMAL LOW (ref 3.9–10.3)
lymph#: 0.6 10e3/uL — ABNORMAL LOW (ref 0.9–3.3)

## 2010-10-18 LAB — COMPREHENSIVE METABOLIC PANEL
ALT: 10 U/L (ref 0–35)
AST: 16 U/L (ref 0–37)
Albumin: 4 g/dL (ref 3.5–5.2)
Alkaline Phosphatase: 48 U/L (ref 39–117)
BUN: 10 mg/dL (ref 6–23)
CO2: 27 mEq/L (ref 19–32)
Calcium: 9.3 mg/dL (ref 8.4–10.5)
Chloride: 103 mEq/L (ref 96–112)
Creatinine, Ser: 0.66 mg/dL (ref 0.40–1.20)
Glucose, Bld: 185 mg/dL — ABNORMAL HIGH (ref 70–99)
Potassium: 3.8 mEq/L (ref 3.5–5.3)
Sodium: 142 mEq/L (ref 135–145)
Total Bilirubin: 0.6 mg/dL (ref 0.3–1.2)
Total Protein: 6 g/dL (ref 6.0–8.3)

## 2010-10-18 LAB — IGM: IgM, Serum: 353 mg/dL — ABNORMAL HIGH (ref 60–263)

## 2010-10-25 LAB — CBC WITH DIFFERENTIAL/PLATELET
BASO%: 0.2 % (ref 0.0–2.0)
Basophils Absolute: 0 10*3/uL (ref 0.0–0.1)
EOS%: 1 % (ref 0.0–7.0)
Eosinophils Absolute: 0 10*3/uL (ref 0.0–0.5)
HCT: 32 % — ABNORMAL LOW (ref 34.8–46.6)
HGB: 10.9 g/dL — ABNORMAL LOW (ref 11.6–15.9)
LYMPH%: 23.9 % (ref 14.0–49.7)
MCH: 34.7 pg — ABNORMAL HIGH (ref 25.1–34.0)
MCHC: 34.1 g/dL (ref 31.5–36.0)
MCV: 101.9 fL — ABNORMAL HIGH (ref 79.5–101.0)
MONO#: 0.8 10*3/uL (ref 0.1–0.9)
MONO%: 19.8 % — ABNORMAL HIGH (ref 0.0–14.0)
NEUT#: 2.3 10*3/uL (ref 1.5–6.5)
NEUT%: 55.1 % (ref 38.4–76.8)
Platelets: 128 10*3/uL — ABNORMAL LOW (ref 145–400)
RBC: 3.14 10*6/uL — ABNORMAL LOW (ref 3.70–5.45)
RDW: 13.7 % (ref 11.2–14.5)
WBC: 4.1 10*3/uL (ref 3.9–10.3)
lymph#: 1 10*3/uL (ref 0.9–3.3)

## 2010-10-28 ENCOUNTER — Ambulatory Visit: Payer: Self-pay | Admitting: Oncology

## 2010-10-29 ENCOUNTER — Encounter: Payer: Self-pay | Admitting: Gastroenterology

## 2010-10-30 ENCOUNTER — Encounter: Payer: Self-pay | Admitting: Oncology

## 2010-11-01 LAB — CBC WITH DIFFERENTIAL/PLATELET
BASO%: 0 % (ref 0.0–2.0)
EOS%: 2.1 % (ref 0.0–7.0)
MCH: 34.7 pg — ABNORMAL HIGH (ref 25.1–34.0)
MCV: 102.3 fL — ABNORMAL HIGH (ref 79.5–101.0)
MONO%: 19.4 % — ABNORMAL HIGH (ref 0.0–14.0)
RBC: 3.08 10*6/uL — ABNORMAL LOW (ref 3.70–5.45)
RDW: 13.5 % (ref 11.2–14.5)
nRBC: 0 % (ref 0–0)

## 2010-11-08 NOTE — Progress Notes (Signed)
Summary: med reaction  Phone Note Call from Patient   Caller: Patient Summary of Call: pt states that metformin and GLIMEPIRIDE are causing her heart to race, SOB, and stomach cramp pt denies nausea,vomiting or diarrhea . pt states that she is taking med with food.pt use [facility]........................Marland KitchenFelecia Deloach CMA  November 19, 2009 4:13 PM. p  Follow-up for Phone Call        hold Glimiperide & monitor; this woul cause tachycardia only if causing hypoglycemia. Continue Metformin; it will not cause tachycardia Follow-up by: Marga Melnick MD,  November 19, 2009 4:41 PM  Additional Follow-up for Phone Call Additional follow up Details #1::        pt aware..............Marland KitchenFelecia Deloach CMA  November 19, 2009 4:54 PM

## 2010-11-08 NOTE — Letter (Signed)
[facility]   Imported By: Lanelle Bal 03/01/2010 12:48:56  _____________________________________________________________________  External Attachment:    Type:   Image     Comment:   External Document

## 2010-11-08 NOTE — Letter (Signed)
[facility]   Imported By: Lanelle Bal 02/04/2010 11:01:05  _____________________________________________________________________  External Attachment:    Type:   Image     Comment:   External Document

## 2010-11-08 NOTE — Assessment & Plan Note (Signed)
Summary: post hospital//new dx of CHF//lch   Vital Signs:  Patient profile:   77 year old female Weight:      188.6 pounds Temp:     98.6 degrees F oral Pulse rate:   120 / minute Resp:     18 per minute BP sitting:   124 / 80  (left arm) Cuff size:   large  Vitals Entered By: Shonna Chock (Mar 08, 2010 1:45 PM) CC: Hospital follow-up Comments REVIEWED MED LIST, PATIENT AGREED DOSE AND INSTRUCTION CORRECT    Primary Care Provider:  Laury Axon  CC:  Hospital follow-up.  History of Present Illness: Since D/C 03/05/2010@ 3:30 am   from ER  she has had nausea , anorexia & watery BMs. D/C  diagnosis  from ER was CHF; diarrhea began afternoon of D/C after 12& 1/2 hr stay in ER. She had received chemotherapy on 05/26  as well unit of pc 05/25 & 26. PNDyspnea began  evening of 05/26. [facility] referred her to ER 05/27; she got there @ 3pm. ER records reviewed.CXray & CT chest with contrast   : NAD; H/H 9.9/29.7;creat 1.28; TP 10.7; GFR 40;& BNP 1850. Dramatic UO after IV Lasix through [facility].  Allergies: 1)  ! Indocin 2)  ! Aleve 3)  ! * Guafenesin 4)  ! * Actonel 5)  ! Codeine  Review of Systems General:  Complains of chills and fatigue; denies fever and sweats. ENT:  Denies difficulty swallowing and hoarseness. CV:  Complains of difficulty breathing at night, difficulty breathing while lying down, and swelling of hands; denies chest pain or discomfort and swelling of feet; Edema has improved. Cardiac enzymes were negative in ER. GI:  Complains of abdominal pain; denies bloody stools and indigestion; Stools dark with PeptoBismol 05/27; now yellow green loose stool.  Physical Exam  General:  Appears tired but in no acute distress; alert,appropriate and cooperative throughout examination Neck:  No NVD @ 30 degrees Lungs:  Normal respiratory effort, chest expands symmetrically. Lungs are clear to auscultation, no crackles or wheezes. Heart:  Normal rate and regular rhythm. S1 and S2 normal  without  click, rub S4 gallop with grade 1.5 /6 systolic murmur.  No HJR Abdomen:  Bowel sounds positive,abdomen soft and non-tender without masses, organomegaly or hernias noted. Pulses:  R and L carotid,radial,dorsalis pedis  are full and equal bilaterally. Decreased PTP Extremities:  1+ left pedal edema and 1+ right pedal edema.   Skin:  Intact without suspicious lesions or rashes Cervical Nodes:  No lymphadenopathy noted Axillary Nodes:  No palpable lymphadenopathy Psych:  memory intact for recent and remote, flat affect, and subdued.     Impression & Recommendations:  Problem # 1:  CONGESTIVE HEART FAILURE (ICD-428.0)  Her updated medication list for this problem includes:    Furosemide 40 Mg Tabs (Furosemide) .Marland Kitchen... 1 by mouth once daily    Aspirin Adult Low Strength 81 Mg Tbec (Aspirin) .Marland Kitchen... 1 by mouth once daily  Orders: Echo Referral (Echo) Venipuncture (52841) TLB-BNP (B-Natriuretic Peptide) (83880-BNPR) Cardiology Referral (Cardiology)  Problem # 2:  DIARRHEA (ICD-787.91) essentially resolved Her updated medication list for this problem includes:    Imodium A-d 2 Mg Tabs (Loperamide hcl) .Marland Kitchen... 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 1 by mouth qd 2)  Furosemide 40 Mg Tabs (Furosemide) .Marland Kitchen.. 1 by mouth once daily 3)  Klor-con M20 20 Meq Tbcr (Potassium chloride crys  cr) .... 2 once daily, labs due 4)  Simvastatin 20 Mg Tabs (Simvastatin) .Marland Kitchen.. 1 by mouth qhs 5)  Amitriptyline Hcl 10 Mg Tabs (Amitriptyline hcl) .Marland Kitchen.. 1 by mouth qhs 6)  Aspirin Adult Low Strength 81 Mg Tbec (Aspirin) .Marland Kitchen.. 1 by mouth once daily 7)  Imodium A-d 2 Mg Tabs (Loperamide hcl) .... 2 by mouth once daily 8)  Omeprazole 20 Mg Cpdr (Omeprazole) .Marland Kitchen.. 1 once daily 9)  Dilaudid 4 Mg Tabs (Hydromorphone hcl) .Marland Kitchen.. 1 by mouth every 6 hours as needed 10)  Metformin Hcl 500 Mg Xr24h-tab (Metformin hcl) .Marland Kitchen.. 1 once daily with largest meal 11)  Lyrica 75 Mg Caps (Pregabalin) .Marland Kitchen.. 1-2 by mouth once daily 12)  Ranitidine Hcl 150 Mg Tabs (Ranitidine hcl) .... 30 minbid pre meals  Patient Instructions: 1)  Limit your Sodium (Salt) to less than 2 grams a day(slightly less than 1/2 a teaspoon) to prevent fluid retention, swelling, or worsening of symptoms.Avoid foods high in acid (tomatoes, citrus juices, spicy foods). Avoid eating within two hours of lying down or before exercising. Do not over eat; try smaller more frequent meals. Elevate head of bed twelve inches when sleeping. Prescriptions: RANITIDINE HCL 150 MG TABS (RANITIDINE HCL) 30 minbid pre meals  #60 x 1   Entered and Authorized by:   Marga Melnick MD   Signed by:   Marga Melnick MD on 03/08/2010   Method used:   Faxed to ...       [facility] [id number]* (retail)       [address] [place], [place]  [address]       Ph: [phone]       Fax: [phone]   RxID:   [id number]   Appended Document: Orders Update    Clinical Lists Changes  Orders: Added new Test order of T- * Misc. Laboratory test [id number]) - Signed

## 2010-11-08 NOTE — Letter (Signed)
Summary: [facility]  [facility]   Imported By: Lanelle Bal 08/26/2010 07:49:34  _____________________________________________________________________  External Attachment:    Type:   Image     Comment:   External Document

## 2010-11-08 NOTE — Assessment & Plan Note (Signed)
Summary: np6/CHF/ok per christine  Medications Added FUROSEMIDE 40 MG  TABS (FUROSEMIDE) 1 by mouth once daily DEXAMETHASONE 4 MG TABS (DEXAMETHASONE) 10 tabs once a week ZANTAC 150 MG TABS (RANITIDINE HCL) as needed METOPROLOL SUCCINATE 50 MG XR24H-TAB (METOPROLOL SUCCINATE) 1 tab once daily FUROSEMIDE 20 MG TABS (FUROSEMIDE) 1 tab in the afternoon        Visit Type:  new pt visit Primary Provider:  Marga Melnick MD  CC:  sob w/heat....pt has multiple myeloma.Marland Kitchenpt's husband see's Dr. Daleen Squibb....  History of Present Illness: Karen Barron is an 77 year old white female asked to see today by Dr. Alwyn Ren for a new diagnosis of congestive heart failure.  She is a very complicated history. I have reviewed all of her outside records including recent hospitalization.  She has been ongoing chemotherapy for multiple myeloma. She was initially diagnosed with multiple myeloma in 2009.  On June 17, she was admitted with dyspnea and tachycardia. Chest x-ray showed mild cardiomegaly and some pulmonary vascular congestion.  She had been on Lasix 40 mg a day for similar symptoms the end of May.  Her resting heart rate was noted to be well over 100.  Hemoglobin had been around 9-1/2.  During that hospitalization, she had a 2-D echocardiogram which showed normal left ventricular function, no LVH, it is 70%, mild diastolic dysfunction, mildly dilated left atrium, pulmonary artery peak pressure of 51 mm of mercury, no pericardial effusion.on June 20, she had a stress nuclear study which showed no ischemia with an EF of 74%.  She feels less short of breath and has less swelling on Lasix. She is now on a twice a day. She was racing start a low-dose metoprolol 25 mg per day. Her resting heart rate today is in the 90s.  She denies any orthopnea, PND but has had some edema. Her legs ache at night because of increased swelling and varicose veins. DVT was ruled out in April.  Current Medications (verified): 1)   Synthroid 75 Mcg  Tabs (Levothyroxine Sodium) .Marland Kitchen.. 1 By Mouth Qd 2)  Furosemide 40 Mg  Tabs (Furosemide) .Marland Kitchen.. 1 By Mouth Once Daily..1/2 Tab At Bedtime 3)  Klor-Con M20 20 Meq  Tbcr (Potassium Chloride Crys Cr) .Marland Kitchen.. 1 Tab Two Times A Day 4)  Simvastatin 20 Mg  Tabs (Simvastatin) .Marland Kitchen.. 1 By Mouth Qhs 5)  Imodium A-D 2 Mg  Tabs (Loperamide Hcl) .... 2 By Mouth Once Daily 6)  Dilaudid 4 Mg Tabs (Hydromorphone Hcl) .Marland Kitchen.. 1 By Mouth Every 6 Hours As Needed 7)  Metformin Hcl 500 Mg Xr24h-Tab (Metformin Hcl) .Marland Kitchen.. 1 Once Daily With Largest Meal 8)  Nexium 20 Mg Cpdr (Esomeprazole Magnesium) .Marland Kitchen.. 1 Tab Once Daily 9)  Voltaren 1 % Gel (Diclofenac Sodium) .... Use Four Times Daily As Needed 10)  Prochlorperazine Maleate 10 Mg Tabs (Prochlorperazine Maleate) .Marland Kitchen.. 1 Tab Every 6 Hours As Needed 11)  Tylenol 8 Hour 650 Mg Cr-Tabs (Acetaminophen) .... As Needed 12)  Amitriptyline Hcl 10 Mg Tabs (Amitriptyline Hcl) .... 2 Tab At Bedtime 13)  Cyclophosphamide 500 Mg Solr (Cyclophosphamide) .... Take As Directed Weekly 14)  Align  Caps (Probiotic Product) .Marland Kitchen.. 1 Cap Once Daily 15)  Dexamethasone 4 Mg Tabs (Dexamethasone) .Marland Kitchen.. 10 Tabs Once A Week 16)  Zantac 150 Mg Tabs (Ranitidine Hcl) .... As Needed  Allergies: 1)  ! Indocin 2)  ! Aleve 3)  ! * Guafenesin 4)  ! * Actonel 5)  ! Codeine 6)  ! * Naproxen Sodium 7)  ! 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), IgA DIVERTICULAR DISEASE (ICD-562.10) DEGENERATIVE JOINT DISEASE (ICD-715.90) UNSPECIFIED DISORDER OF THYROID (ICD-246.9) FATTY LIVER DISEASE (ICD-571.8) HYPERTENSION (ICD-401.9) DIABETES MELLITUS, TYPE II, CONTROLLED (ICD-250.00) DYSPNEA ON EXERTION (ICD-786.09) HYPERLIPIDEMIA NEC/NOS (ICD-272.4) SYMPTOM, EDEMA (ICD-782.3) SPINAL STENOSIS (ICD-724.00) MYELOMA, MULTIPLE IN REMISSION (ICD-203.01) OSTEOPOROSIS (ICD-733.00) COLONIC  POLYPS, HX OF (ICD-V12.72) ASTHMA (ICD-493.90) ANEMIA-NOS (ICD-285.9)  Past Surgical History: Last updated: 04/27/2008 hysterectomy for fibroids appendectomy Kyphoplasty X2 ; Shoulder replacement Cataract extraction Colon polypectomy Total knee replacement bilat Lumbar fusion  Family History: Last updated: 04/27/2008 Father: leukemia Mother: osteoporosis Siblings: sister cns CA, melanoma  Social History: Last updated: 04/27/2008 No diet  Risk Factors: Exercise: no (04/27/2008)  Risk Factors: Smoking Status: never (04/10/2007)  Review of Systems       negative other than history of present illness  Vital Signs:  Patient profile:   77 year old female Height:      63.5 inches Weight:      181 pounds BMI:     31.67 Pulse rate:   96 / minute Pulse rhythm:   regular BP sitting:   120 / 58  (left arm) Cuff size:   large  Vitals Entered By: Danielle Rankin, CMA (April 20, 2010 10:44 AM)  Physical Exam  General:  elderly, pale, no acute distress, obese Head:  normocephalic and atraumatic Eyes:  glasses otherwise normal Neck:  Neck supple, no JVD. No masses, thyromegaly or abnormal cervical nodes. Lungs:  Clear bilaterally to auscultation and percussion. Heart:  PMI nondisplaced, S4 at the apex, regular rate and rhythm, split S2, no significant murmur Abdomen:  no hepatomegaly, positive bowel sounds, no obvious ascites Msk:  decreased ROM.  decreased ROM.   Pulses:  pulses normal in all 4 extremities Extremities:  1+ left pedal edema and 1+ right pedal edema.  superficial varicosities, no sign of DVT Neurologic:  Alert and oriented x 3. Skin:  Intact without lesions or rashes. Psych:  Normal affect.   Problems:  Medical Problems Added: 1)  Dx of Edema  (ICD-782.3) 2)  Dx of Edema  (ICD-782.3) 3)  Dx of Tachycardia  (ICD-785) 4)  Dx of Diastolic Heart Failure, Chronic  (ICD-428.32)  EKG  Procedure date:  04/20/2010  Findings:      normal sinus rhythm,  normal EKG  Impression & Recommendations:  Problem # 1:  DIASTOLIC HEART FAILURE, CHRONIC (ICD-428.32) Assessment New I have increased her metoprolol succinate 50 mg per day. Resting heart rate hopefully will be below 80 and will improve diastolic filling time and decrease strain on the heart. I spent a lot of time with the patient and her husband today explaining all this. The following medications were removed from the medication list:    Aspirin Adult Low Strength 81 Mg Tbec (Aspirin) .Marland Kitchen... 1 by mouth once daily Her updated medication list for this problem includes:    Furosemide 40 Mg Tabs (Furosemide) .Marland Kitchen... 1 by mouth once daily    Metoprolol Succinate 50 Mg Xr24h-tab (Metoprolol succinate) .Marland Kitchen... 1 tab once daily    Furosemide 20 Mg Tabs (Furosemide) .Marland Kitchen... 1 tab in the afternoon  Orders: EKG w/ Interpretation (93000)  Problem # 2:  TACHYCARDIA (ICD-785) Assessment: Improved Orders: EKG w/ Interpretation (93000)  Problem # 3:  DIABETES MELLITUS, TYPE II, CONTROLLED (ICD-250.00) Assessment: Unchanged  The following medications were removed from the medication list:    Aspirin Adult Low Strength 81 Mg Tbec (Aspirin) .Marland KitchenMarland KitchenMarland KitchenMarland Kitchen  1 by mouth once daily    Glucophage 500 Mg Tabs (Metformin hcl) .Marland Kitchen... 1 tab once daily Her updated medication list for this problem includes:    Metformin Hcl 500 Mg Xr24h-tab (Metformin hcl) .Marland Kitchen... 1 once daily with largest meal  Problem # 4:  EDEMA (ICD-782.3) Assessment: Improved  No change in current dose of furosemide. Orders: EKG w/ Interpretation (93000)  Patient Instructions: 1)  Your physician recommends that you schedule a follow-up appointment in: 6 weeks with Dr. Daleen Squibb 2)  Your physician has recommended you make the following change in your medication: Increase Metoprolol to 50mg  daily Prescriptions: FUROSEMIDE 20 MG TABS (FUROSEMIDE) 1 tab in the afternoon  #90 x 3   Entered by:   Danielle Rankin, CMA   Authorized by:   Gaylord Shih, MD, [facility]    Signed by:   Danielle Rankin, CMA on 04/20/2010   Method used:   Faxed to ...       [facility])       [address] [address]       [place], [place]  [address]       Ph: [phone]       Fax: [phone]   RxID:   [id number] METOPROLOL SUCCINATE 50 MG XR24H-TAB (METOPROLOL SUCCINATE) 1 tab once daily  #90 x 3   Entered by:   Danielle Rankin, CMA   Authorized by:   Gaylord Shih, MD, [facility]   Signed by:   Danielle Rankin, CMA on 04/20/2010   Method used:   Faxed to ...       [facility])       [address] [address]       [place], [place]  [address]       Ph: [phone]       Fax: [phone]   RxID:   [id number] FUROSEMIDE 20 MG TABS (FUROSEMIDE) 1 tab in the afternoon  #30 x 0   Entered by:   Danielle Rankin, CMA   Authorized by:   Gaylord Shih, MD, [facility]   Signed by:   Danielle Rankin, CMA on 04/20/2010   Method used:   Electronically to        [facility] [id number] (retail)       [address]       [place], [place]  [address]       Ph: [phone]       Fax: [phone]   RxID:   [id number] METOPROLOL SUCCINATE 50 MG XR24H-TAB (METOPROLOL SUCCINATE) 1 tab once daily  #30 x 0   Entered by:   Danielle Rankin, CMA   Authorized by:   Gaylord Shih, MD, [facility]   Signed by:   Danielle Rankin, CMA on 04/20/2010   Method used:   Electronically to        [facility] [id number] (retail)       [address]       [place], [place]  [address]       Ph: [phone]       Fax: [phone]   RxID:   [id number]

## 2010-11-08 NOTE — Letter (Signed)
[facility]   Imported By: Lanelle Bal 04/18/2010 08:55:03  _____________________________________________________________________  External Attachment:    Type:   Image     Comment:   External Document

## 2010-11-08 NOTE — Consult Note (Signed)
Summary: [facility]  [facility]   Imported By: Lanelle Bal 03/17/2010 12:47:00  _____________________________________________________________________  External Attachment:    Type:   Image     Comment:   External Document

## 2010-11-08 NOTE — Letter (Signed)
Summary: [facility]  [facility]   Imported By: Lanelle Bal 07/20/2010 12:03:27  _____________________________________________________________________  External Attachment:    Type:   Image     Comment:   External Document

## 2010-11-08 NOTE — Progress Notes (Signed)
Summary: REFILL  Phone Note Refill Request Message from:  Patient on August 08, 2010 2:50 PM  Refills Requested: Medication #1:  SIMVASTATIN 20 MG  TABS 1 by mouth qhs   Dosage confirmed as above?Dosage Confirmed   Supply Requested: 3 months  Medication #2:  AMITRIPTYLINE HCL 10 MG TABS 2 tab at bedtime   Dosage confirmed as above?Dosage Confirmed   Supply Requested: 3 months [facility]  Initial call taken by: Lavell Islam,  August 08, 2010 2:51 PM    Prescriptions: AMITRIPTYLINE HCL 10 MG TABS (AMITRIPTYLINE HCL) 2 tab at bedtime  #180 x 0   Entered by:   Shonna Chock CMA   Authorized by:   Marga Melnick MD   Signed by:   Shonna Chock CMA on 08/08/2010   Method used:   Printed then faxed to ...       [facility] (mail-order)             , [place]         Ph: [phone]       Fax: [phone]   RxID:   [id number] SIMVASTATIN 20 MG  TABS (SIMVASTATIN) 1 by mouth qhs  #90 x 1   Entered by:   Shonna Chock CMA   Authorized by:   Marga Melnick MD   Signed by:   Shonna Chock CMA on 08/08/2010   Method used:   Printed then faxed to ...       [facility] (mail-order)             , [place]         Ph: [phone]       Fax: [phone]   RxID:   [id number]

## 2010-11-08 NOTE — Assessment & Plan Note (Signed)
Summary: f6w/dfg  Medications Added ZANTAC 150 MG TABS (RANITIDINE HCL) as needed FUROSEMIDE 20 MG TABS (FUROSEMIDE) 1 tab in the afternoon PROCHLORPERAZINE MALEATE 5 MG TABS (PROCHLORPERAZINE MALEATE) 1 TAB Q 6 HOURS as needed        Visit Type:  6 wk f/u Primary Provider:  Marga Melnick MD  CC:  pt states yesterday she had a a heavy feeling in her chest...Marland Kitchenfelt weak and states she just did not feel good at all...edema/ankles at times...denies any sob.  History of Present Illness: Karen Barron comes in today for further evaluation and management of her history of diastolic heart failure.  Yesterday, she awoke with some chest heaviness and stay with her all day. She has been having a very difficult time with chemotherapy every Thursday for her myeloma. It literally wipes her out for several days.  She does have a history of gastroesophageal reflux.  Her chest tightness was not related with any nausea or vomiting burping or belching. She did not break out in a sweat. She was not short of breath. The discomfort did not radiate.  Current Medications (verified): 1)  Synthroid 75 Mcg  Tabs (Levothyroxine Sodium) .Marland Kitchen.. 1 By Mouth Qd 2)  Furosemide 40 Mg  Tabs (Furosemide) .Marland Kitchen.. 1 By Mouth Once Daily 3)  Klor-Con M20 20 Meq  Tbcr (Potassium Chloride Crys Cr) .Marland Kitchen.. 1 Tab Two Times A Day 4)  Simvastatin 20 Mg  Tabs (Simvastatin) .Marland Kitchen.. 1 By Mouth Qhs 5)  Imodium A-D 2 Mg  Tabs (Loperamide Hcl) .... 2 By Mouth Once Daily 6)  Dilaudid 4 Mg Tabs (Hydromorphone Hcl) .Marland Kitchen.. 1 By Mouth Every 6 Hours As Needed 7)  Nexium 20 Mg Cpdr (Esomeprazole Magnesium) .Marland Kitchen.. 1 Tab Once Daily 8)  Voltaren 1 % Gel (Diclofenac Sodium) .... Use Four Times Daily As Needed 9)  Prochlorperazine Maleate 10 Mg Tabs (Prochlorperazine Maleate) .Marland Kitchen.. 1 Tab Every 6 Hours As Needed 10)  Tylenol 8 Hour 650 Mg Cr-Tabs (Acetaminophen) .... As Needed 11)  Amitriptyline Hcl 10 Mg Tabs (Amitriptyline Hcl) .... 2 Tab At Bedtime 12)   Cyclophosphamide 500 Mg Solr (Cyclophosphamide) .... Take As Directed Weekly 13)  Align  Caps (Probiotic Product) .Marland Kitchen.. 1 Cap Once Daily 14)  Dexamethasone 4 Mg Tabs (Dexamethasone) .Marland Kitchen.. 10 Tabs Once A Week 15)  Zantac 150 Mg Tabs (Ranitidine Hcl) .... As Needed 16)  Metoprolol Succinate 50 Mg Xr24h-Tab (Metoprolol Succinate) .Marland Kitchen.. 1 Tab Once Daily 17)  Furosemide 20 Mg Tabs (Furosemide) .Marland Kitchen.. 1 Tab in The Afternoon 18)  Prochlorperazine Maleate 5 Mg Tabs (Prochlorperazine Maleate) .Marland Kitchen.. 1 Tab Q 6 Hours As Needed  Allergies: 1)  ! Indocin 2)  ! Aleve 3)  ! * Guafenesin 4)  ! * Actonel 5)  ! Codeine 6)  ! * Naproxen Sodium 7)  ! 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), IgA DIVERTICULAR DISEASE (ICD-562.10) DEGENERATIVE JOINT DISEASE (ICD-715.90) UNSPECIFIED DISORDER OF THYROID (ICD-246.9) FATTY LIVER DISEASE (ICD-571.8) HYPERTENSION (ICD-401.9) DIABETES MELLITUS, TYPE II, CONTROLLED (ICD-250.00) DYSPNEA ON EXERTION (ICD-786.09) HYPERLIPIDEMIA NEC/NOS (ICD-272.4) SYMPTOM, EDEMA (ICD-782.3) SPINAL STENOSIS (ICD-724.00) MYELOMA, MULTIPLE IN REMISSION (ICD-203.01) OSTEOPOROSIS (ICD-733.00) COLONIC POLYPS, HX OF (ICD-V12.72) ASTHMA (ICD-493.90) ANEMIA-NOS (ICD-285.9)  Past Surgical History: Last updated: 04/27/2008 hysterectomy for fibroids appendectomy Kyphoplasty X2 ; Shoulder replacement Cataract extraction Colon polypectomy Total knee replacement bilat Lumbar fusion  Family History: Last updated: 04/27/2008 Father: leukemia Mother: osteoporosis Siblings: sister cns CA, melanoma  Social History:  Last updated: 04/27/2008 No diet  Risk Factors: Exercise: no (04/27/2008)  Risk Factors: Smoking Status: never (04/10/2007)  Review of Systems       negative other than history of present illness  Vital Signs:  Patient profile:   77 year old  female Height:      63.5 inches Weight:      178.8 pounds BMI:     31.29 Pulse rate:   86 / minute Pulse rhythm:   irregular BP sitting:   110 / 60  (left arm) Cuff size:   large  Vitals Entered By: Danielle Rankin, CMA (June 27, 2010 5:04 PM)  Physical Exam  General:  no acute distress, pale,obese.   Head:  normocephalic and atraumatic Eyes:  PERRLA/EOM intact; conjunctiva and lids normal. Neck:  Neck supple, no JVD. No masses, thyromegaly or abnormal cervical nodes. Chest Wall:  no deformities or breast masses noted Lungs:  Clear bilaterally to auscultation and percussion. Heart:  PMI poorly appreciated, normal S1-S2 regular rate and rhythm no murmur rub or gallop. Carotid strokes are equal bilaterally with a soft systolic sound on the left Msk:  decreased ROM.   Pulses:  pulses normal in all 4 extremities Extremities:  No clubbing or cyanosis. Neurologic:  Alert and oriented x 3. Skin:  Intact without lesions or rashes. Psych:  depressed affect.     Problems:  Medical Problems Added: 1)  Dx of Chest Tightness-pressure-other  ([id number])  EKG  Procedure date:  06/27/2010  Findings:      normal sinus rhythm, normal EKG  Impression & Recommendations:  Problem # 1:  DIASTOLIC HEART FAILURE, CHRONIC (ICD-428.32) Assessment Unchanged  Her updated medication list for this problem includes:    Furosemide 40 Mg Tabs (Furosemide) .Marland Kitchen... 1 by mouth once daily    Metoprolol Succinate 50 Mg Xr24h-tab (Metoprolol succinate) .Marland Kitchen... 1 tab once daily    Furosemide 20 Mg Tabs (Furosemide) .Marland Kitchen... 1 tab in the afternoon  Orders: EKG w/ Interpretation (93000)  Problem # 2:  EDEMA (ICD-782.3) Assessment: Improved  Problem # 3:  CHEST TIGHTNESS-PRESSURE-OTHER ([id number]) I suspect her chest discomfort yesterday was related to some reflux not to mention just feeling so bad in general from her chemotherapy for her myeloma. Her EKG today is stable and normal. Reassurance  given.  Patient Instructions: 1)  Your physician recommends that you schedule a follow-up appointment in: 1 year with Dr. Daleen Squibb 2)  Your physician recommends that you continue on your current medications as directed. Please refer to the Current Medication list given to you today. Prescriptions: FUROSEMIDE 20 MG TABS (FUROSEMIDE) 1 tab in the afternoon  #90 x 3   Entered by:   Danielle Rankin, CMA   Authorized by:   Gaylord Shih, MD, [facility]   Signed by:   Danielle Rankin, CMA on 06/27/2010   Method used:   Faxed to ...       [facility] education [facility])       [address] [address]       [place], [place]  [address]       Ph: [phone]       Fax: [phone]   RxID:   [id number]

## 2010-11-08 NOTE — Letter (Signed)
Summary: [facility]  [facility]   Imported By: Lanelle Bal 06/23/2010 09:16:56  _____________________________________________________________________  External Attachment:    Type:   Image     Comment:   External Document

## 2010-11-08 NOTE — Miscellaneous (Signed)
Summary: med update  Clinical Lists Changes  Medications: Added new medication of * MEGACE 200MG  1 tab two times a day Added new medication of VOLTAREN 1 % GEL (DICLOFENAC SODIUM) use four times daily as needed Added new medication of PROCHLORPERAZINE MALEATE 10 MG TABS (PROCHLORPERAZINE MALEATE) 1 tab every 6 hours as needed Added new medication of TYLENOL 8 HOUR 650 MG CR-TABS (ACETAMINOPHEN) as needed Added new medication of GLUCOPHAGE 500 MG TABS (METFORMIN HCL) 1 tab once daily Changed medication from RANITIDINE HCL 150 MG TABS (RANITIDINE HCL) 30 minbid pre meals to NEXIUM 20 MG CPDR (ESOMEPRAZOLE MAGNESIUM) 1 tab once daily Removed medication of OMEPRAZOLE 20 MG CPDR (OMEPRAZOLE) 1 once daily Added new medication of AMITRIPTYLINE HCL 10 MG TABS (AMITRIPTYLINE HCL) 2 tab at bedtime Changed medication from KLOR-CON M20 20 MEQ  TBCR (POTASSIUM CHLORIDE CRYS CR) 2 once daily, LABS DUE to KLOR-CON M20 20 MEQ  TBCR (POTASSIUM CHLORIDE CRYS CR) 1 tab two times a day Changed medication from DILAUDID 4 MG TABS (HYDROMORPHONE HCL) 1 by mouth EVERY 6 HOURS as needed to DILAUDID 4 MG TABS (HYDROMORPHONE HCL) 1 by mouth EVERY 6 HOURS as needed Added new medication of CYCLOPHOSPHAMIDE 500 MG SOLR (CYCLOPHOSPHAMIDE) take as directed weekly Added new medication of ALIGN  CAPS (PROBIOTIC PRODUCT) 1 cap once daily Added new medication of * DEXAMETHASONE 40 mg weekly Removed medication of AMITRIPTYLINE HCL 10 MG  TABS (AMITRIPTYLINE HCL) 1 by mouth qhs Allergies: Added new allergy or adverse reaction of * NAPROXEN SODIUM Added new allergy or adverse reaction of * CLADRIBINE

## 2010-11-08 NOTE — Progress Notes (Signed)
Summary: Lab Results  Phone Note Outgoing Call   Call placed by: Shonna Chock,  November 08, 2009 5:26 PM Call placed to: Patient Details for Reason: Lab results Summary of Call: Spoke with patient: patient ok'd all information below, scheduled appoinment for recheck 12/29/2009 @ 12:00pm Diabetes is uncontrolled ; triglycerides should be less than 150. Please start 2 new medications & make nutrition changes . Avoid ALL foods & drinks with High Fructose Corn Syrup as #1 ,2 or #3 on label. Recheck A1c in 8 weeks (250.02). [employer] Initial call taken by: Shonna Chock,  November 08, 2009 5:26 PM

## 2010-11-08 NOTE — Letter (Signed)
[facility]   Imported By: Lanelle Bal 04/19/2010 11:50:47  _____________________________________________________________________  External Attachment:    Type:   Image     Comment:   External Document

## 2010-11-08 NOTE — Letter (Signed)
[facility]   Imported By: Lanelle Bal 05/17/2010 14:15:58  _____________________________________________________________________  External Attachment:    Type:   Image     Comment:   External Document

## 2010-11-08 NOTE — Letter (Signed)
Summary: [facility]  [facility]   Imported By: Lanelle Bal 12/31/2009 14:16:08  _____________________________________________________________________  External Attachment:    Type:   Image     Comment:   External Document

## 2010-11-08 NOTE — Letter (Signed)
Summary: [facility]  [facility]   Imported By: Lanelle Bal 09/08/2010 08:28:48  _____________________________________________________________________  External Attachment:    Type:   Image     Comment:   External Document

## 2010-11-08 NOTE — Letter (Signed)
[facility]   Imported By: Lennie Odor 04/05/2010 14:04:22  _____________________________________________________________________  External Attachment:    Type:   Image     Comment:   External Document

## 2010-11-08 NOTE — Letter (Signed)
[facility]   Imported By: Lanelle Bal 04/19/2010 11:48:58  _____________________________________________________________________  External Attachment:    Type:   Image     Comment:   External Document

## 2010-11-08 NOTE — Progress Notes (Signed)
Summary: Refill Meds   Phone Note Refill Request Message from:  Patient  Refills Requested: Medication #1:  SYNTHROID 75 MCG  TABS 1 by mouth qd  Medication #2:  KLOR-CON M20 20 MEQ  TBCR 1 tab two times a day  Medication #3:  FUROSEMIDE 40 MG  TABS 1 by mouth once daily  Medication #4:  NEXIUM 20 MG CPDR 1 tab once daily [facility]     Prescriptions: NEXIUM 20 MG CPDR (ESOMEPRAZOLE MAGNESIUM) 1 tab once daily  #90 x 2   Entered by:   Shonna Chock CMA   Authorized by:   Marga Melnick MD   Signed by:   Shonna Chock CMA on 04/27/2010   Method used:   Electronically to        [facility] (mail-order)             , [place]         Ph: [phone]       Fax: [phone]   RxID:   [id number] KLOR-CON M20 20 MEQ  TBCR (POTASSIUM CHLORIDE CRYS CR) 1 tab two times a day  #90 x 2   Entered by:   Shonna Chock CMA   Authorized by:   Marga Melnick MD   Signed by:   Shonna Chock CMA on 04/27/2010   Method used:   Electronically to        [facility]* (mail-order)             , [place]         Ph: [phone]       Fax: [phone]   RxID:   [id number] FUROSEMIDE 40 MG  TABS (FUROSEMIDE) 1 by mouth once daily  #90 x 2   Entered by:   Shonna Chock CMA   Authorized by:   Marga Melnick MD   Signed by:   Shonna Chock CMA on 04/27/2010   Method used:   Electronically to        [facility] (mail-order)             , [place]         Ph: [phone]       Fax: [phone]   RxID:   [id number] SYNTHROID 75 MCG  TABS (LEVOTHYROXINE SODIUM) 1 by mouth qd  #90 x 2   Entered by:   Shonna Chock CMA   Authorized by:   Marga Melnick MD   Signed by:   Shonna Chock CMA on 04/27/2010   Method used:   Electronically to        [facility] (mail-order)             , [place]         Ph: [phone]       Fax: [phone]   RxID:   [id number]   Appended Document: Refill Meds      Prescriptions: SYNTHROID 75 MCG  TABS (LEVOTHYROXINE SODIUM) 1 by mouth qd  #90 x 2  Entered by:   Shonna Chock CMA   Authorized by:   Marga Melnick MD   Signed by:   Shonna Chock CMA on 04/27/2010   Method used:   Faxed to ...       [facility] (mail-order)             , [place]         Ph: [phone]       Fax: [phone]   RxID:   [id number] NEXIUM 20 MG CPDR (ESOMEPRAZOLE MAGNESIUM) 1 tab  once daily  #90 x 2   Entered by:   Shonna Chock CMA   Authorized by:   Marga Melnick MD   Signed by:   Shonna Chock CMA on 04/27/2010   Method used:   Faxed to ...       [facility] (mail-order)             , [place]         Ph: [phone]       Fax: [phone]   RxID:   [id number] KLOR-CON M20 20 MEQ  TBCR (POTASSIUM CHLORIDE CRYS CR) 1 tab two times a day  #90 x 2   Entered by:   Shonna Chock CMA   Authorized by:   Marga Melnick MD   Signed by:   Shonna Chock CMA on 04/27/2010   Method used:   Faxed to ...       [facility] (mail-order)             , [place]         Ph: [phone]       Fax: [phone]   RxID:   [id number] FUROSEMIDE 40 MG  TABS (FUROSEMIDE) 1 by mouth once daily  #90 x 2   Entered by:   Shonna Chock CMA   Authorized by:   Marga Melnick MD   Signed by:   Shonna Chock CMA on 04/27/2010   Method used:   Faxed to ...       [facility] (mail-order)             , [place]         Ph: [phone]       Fax: [phone]   RxID:   [id number]    Message came in via  flag stating rx's failed to go through electronically, so I resent by fax.Shonna Chock CMA  April 27, 2010 1:48 PM

## 2010-11-08 NOTE — Letter (Signed)
Summary: [facility]  [facility]   Imported By: Lanelle Bal 11/09/2009 08:36:38  _____________________________________________________________________  External Attachment:    Type:   Image     Comment:   External Document

## 2010-11-08 NOTE — Assessment & Plan Note (Signed)
Summary: CONGESTED /CBS   Vital Signs:  Patient profile:   77 year old female Height:      63.5 inches (161.29 cm) Weight:      178.50 pounds (81.14 kg) BMI:     31.24 Temp:     97.7 degrees F (36.50 degrees C) oral Resp:     16 per minute BP sitting:   110 / 60  (left arm) Cuff size:   large  Vitals Entered By: Lucious Groves CMA (September 09, 2010 11:50 AM) CC: C/O congestion since yesterday./kb, URI symptoms Is Patient Diabetic? No Pain Assessment Patient in pain? no      Comments Patient notes that she has been having congestion, cough with green mucous production, loose stools, HA, and SOB. She denies fever.   Primary Care Aryiah Monterosso:  Marga Melnick MD  CC:  C/O congestion since yesterday./kb and URI symptoms.  History of Present Illness:      This is an 77 year old woman who presents with RTI  symptoms; onset 11/24  as ST. As of 12/01 the patient reports nasal congestion with  purulent nasal discharge, and productive cough with green sputum, but denies sore throat and earache.  Associated symptoms include low-grade fever (<100.5 degrees), some dyspnea  and wheezing.  The patient denies headache.  Risk factors for Strep sinusitis include bilateral facial pain.  The patient denies the following risk factors for Strep sinusitis: tooth pain and tender adenopathy. Seven episodes of  watery diarrhea  since 12/01.No recent antibiotics. Last Chemotherapy 11/29; this usually causes diarrhea , but not this severe.  Current Medications (verified): 1)  Synthroid 75 Mcg  Tabs (Levothyroxine Sodium) .Marland Kitchen.. 1 By Mouth Qd 2)  Furosemide 40 Mg  Tabs (Furosemide) .Marland Kitchen.. 1 By Mouth Once Daily 3)  Klor-Con M20 20 Meq  Tbcr (Potassium Chloride Crys Cr) .Marland Kitchen.. 1 Tab Two Times A Day 4)  Simvastatin 20 Mg  Tabs (Simvastatin) .Marland Kitchen.. 1 By Mouth Qhs 5)  Imodium A-D 2 Mg  Tabs (Loperamide Hcl) .... 2 By Mouth Once Daily 6)  Dilaudid 4 Mg Tabs (Hydromorphone Hcl) .Marland Kitchen.. 1 By Mouth Every 6 Hours As Needed 7)   Nexium 20 Mg Cpdr (Esomeprazole Magnesium) .Marland Kitchen.. 1 Tab Once Daily 8)  Voltaren 1 % Gel (Diclofenac Sodium) .... Use Four Times Daily As Needed 9)  Prochlorperazine Maleate 10 Mg Tabs (Prochlorperazine Maleate) .Marland Kitchen.. 1 Tab Every 6 Hours As Needed 10)  Tylenol 8 Hour 650 Mg Cr-Tabs (Acetaminophen) .... As Needed 11)  Amitriptyline Hcl 10 Mg Tabs (Amitriptyline Hcl) .... 2 Tab At Bedtime 12)  Cyclophosphamide 500 Mg Solr (Cyclophosphamide) .... Take As Directed Weekly 13)  Align  Caps (Probiotic Product) .Marland Kitchen.. 1 Cap Once Daily 14)  Dexamethasone 4 Mg Tabs (Dexamethasone) .Marland Kitchen.. 10 Tabs Once A Week 15)  Zantac 150 Mg Tabs (Ranitidine Hcl) .... As Needed 16)  Metoprolol Succinate 50 Mg Xr24h-Tab (Metoprolol Succinate) .Marland Kitchen.. 1 Tab Once Daily 17)  Furosemide 20 Mg Tabs (Furosemide) .Marland Kitchen.. 1 Tab in The Afternoon 18)  Prochlorperazine Maleate 5 Mg Tabs (Prochlorperazine Maleate) .Marland Kitchen.. 1 Tab Q 6 Hours As Needed  Allergies (verified): 1)  ! Indocin 2)  ! Aleve 3)  ! * Guafenesin 4)  ! * Actonel 5)  ! Codeine 6)  ! * Naproxen Sodium 7)  ! * Cladribine  Physical Exam  General:  Appears fatigued but in no acute distress; appropriate and cooperative throughout examination Ears:  External ear exam shows no significant lesions or deformities.  Otoscopic examination reveals clear canals, tympanic membranes are intact bilaterally without bulging, retraction, inflammation or discharge. Hearing is grossly normal bilaterally. Nose:  External nasal examination shows no deformity or inflammation. Nasal mucosa are erythematous  without lesions or exudates. Slight septal dislocation Mouth:  Oral mucosa and oropharynx without lesions or exudates.  Teeth in good repair.Slightly hoarse Lungs:  Normal respiratory effort, chest expands symmetrically. Lungs are clear to auscultation, no crackles or wheezes.O2 sats 97 % Heart:  Normal rate and regular rhythm. S1 and S2 normal without gallop, murmur, click, rub or other extra  sounds. Abdomen:  Bowel sounds positive,abdomen soft  but minimally tender without masses, organomegaly or hernias noted. Skin:  Intact without suspicious lesions or rashesPallor Cervical Nodes:  No lymphadenopathy noted Axillary Nodes:  No palpable lymphadenopathy Psych:  memory intact for recent and remote, normally interactive, and good eye contact.     Impression & Recommendations:  Problem # 1:  SINUSITIS- ACUTE-NOS (ICD-461.9)  Orders: Venipuncture ([address]109) TLB-CBC Platelet - w/Differential (85025-CBCD) Prescription Created Electronically [phone])  Her updated medication list for this problem includes:    Smz-tmp Ds 800-160 Mg Tabs (Sulfamethoxazole-trimethoprim) .Marland Kitchen... 1 two times a day with 8 oz water  Problem # 2:  BRONCHITIS-ACUTE (ICD-466.0)  Orders: Venipuncture (09811) TLB-CBC Platelet - w/Differential (85025-CBCD) Prescription Created Electronically [phone])  Her updated medication list for this problem includes:    Smz-tmp Ds 800-160 Mg Tabs (Sulfamethoxazole-trimethoprim) .Marland Kitchen... 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 1 by mouth qd 2)  Furosemide 40 Mg Tabs (Furosemide) .Marland Kitchen.. 1 by mouth once daily 3)  Klor-con M20 20 Meq Tbcr (Potassium chloride crys cr) .Marland Kitchen.. 1 tab two times a day 4)  Simvastatin 20 Mg Tabs (Simvastatin) .Marland Kitchen.. 1 by mouth qhs 5)  Imodium A-d 2 Mg Tabs (Loperamide hcl) .... 2 by mouth once daily 6)  Dilaudid 4 Mg Tabs (Hydromorphone hcl) .Marland Kitchen.. 1 by mouth every 6  hours as needed 7)  Nexium 20 Mg Cpdr (Esomeprazole magnesium) .Marland Kitchen.. 1 tab once daily 8)  Voltaren 1 % Gel (Diclofenac sodium) .... Use four times daily as needed 9)  Prochlorperazine Maleate 10 Mg Tabs (Prochlorperazine maleate) .Marland Kitchen.. 1 tab every 6 hours as needed 10)  Tylenol 8 Hour 650 Mg Cr-tabs (Acetaminophen) .... As needed 11)  Amitriptyline Hcl 10 Mg Tabs (Amitriptyline hcl) .... 2 tab at bedtime 12)  Cyclophosphamide 500 Mg Solr (Cyclophosphamide) .... Take as directed weekly 13)  Align Caps (Probiotic product) .Marland Kitchen.. 1 cap once daily 14)  Dexamethasone 4 Mg Tabs (Dexamethasone) .Marland Kitchen.. 10 tabs once a week 15)  Zantac 150 Mg Tabs (Ranitidine hcl) .... As needed 16)  Metoprolol Succinate 50 Mg Xr24h-tab (Metoprolol succinate) .Marland Kitchen.. 1 tab once daily 17)  Furosemide 20 Mg Tabs (Furosemide) .Marland Kitchen.. 1 tab in the afternoon 18)  Prochlorperazine Maleate 5 Mg Tabs (Prochlorperazine maleate) .Marland Kitchen.. 1 tab q 6 hours as needed 19)  Smz-tmp Ds 800-160 Mg Tabs (Sulfamethoxazole-trimethoprim) .Marland Kitchen.. 1 two times a day with 8 oz water  Patient Instructions: 1)  Drink clear liquids only for the next 24 hours, then slowly add  other liquids and food as you  tolerate them. To ER WITH THIS RECORD if symptoms progress. Lomotil as needed for watery stool. Prescriptions: SMZ-TMP DS 800-160 MG TABS (SULFAMETHOXAZOLE-TRIMETHOPRIM) 1 two times a day with 8 oz water  #20 x 0   Entered and Authorized by:   Marga Melnick MD   Signed by:   Marga Melnick MD on 09/09/2010   Method used:   Faxed to ...       [facility] [id number]* (retail)       [address] [place], [place]  [address]       Ph: [phone]       Fax: [phone]   RxID:   [id number]    Orders Added: 1)  Est. Patient Level IV [56387] 2)  Venipuncture [56433] 3)  TLB-CBC Platelet - w/Differential [85025-CBCD] 4)  T-Culture, C-Diff Toxin A/B [[id number]] 5)  T-Culture, Stool [87045/[id number]] 6)  TLB-BMP (Basic  Metabolic Panel-BMET) [80048-METABOL] 7)  Prescription Created Electronically [phone]

## 2010-11-08 NOTE — Letter (Signed)
Summary: [facility]   Imported By: Lanelle Bal 01/08/2010 11:29:32  _____________________________________________________________________  External Attachment:    Type:   Image     Comment:   External Document

## 2010-11-08 NOTE — Progress Notes (Signed)
Summary: Refill Request  Phone Note Refill Request Message from:  Patient  Refills Requested: Medication #1:  KLOR-CON M20 20 MEQ  TBCR 2 once daily [facility]   Method Requested: Fax to Local Pharmacy Initial call taken by: Shonna Chock,  January 05, 2010 1:15 PM    New/Updated Medications: KLOR-CON M20 20 MEQ  TBCR (POTASSIUM CHLORIDE CRYS CR) 2 once daily, LABS DUE Prescriptions: KLOR-CON M20 20 MEQ  TBCR (POTASSIUM CHLORIDE CRYS CR) 2 once daily, LABS DUE  #180 x 0   Entered by:   Shonna Chock   Authorized by:   Marga Melnick MD   Signed by:   Shonna Chock on 01/05/2010   Method used:   Faxed to ...       [facility])             , [place]         Ph: [phone]       Fax: [phone]   RxID:   [id number]

## 2010-11-08 NOTE — Assessment & Plan Note (Signed)
Summary: rto 5 months.cbs   Vital Signs:  Patient profile:   77 year old female Weight:      192.6 pounds Temp:     98.4 degrees F oral Pulse rate:   80 / minute Resp:     17 per minute BP sitting:   110 / 68  (left arm) Cuff size:   large  Vitals Entered By: Shonna Chock (November 04, 2009 8:56 AM) CC: 5 month follow-up Comments REVIEWED MED LIST, PATIENT AGREED DOSE AND INSTRUCTION CORRECT    Primary Care Provider:  Laury Axon  CC:  5 month follow-up.  History of Present Illness: Pain is constant from hips to toes;steroids  by mouth  help.Based on MRI,  Dr Anne Hahn recommended ESI . She will see Dr Noel Gerold today.Their most recent evaluations & 11/10 OVs reviewed: S/P L T12 post herpetic neuralgia; peripheral neuropathy (? chemo component); Myeloma ( no evidence of spinal involvement as per MRI). Neurontin of no benefit; Dilaudid partially helpful. Mild anemia 10/13/2009; no other labs.Not checking FBS @ home  Allergies: 1)  ! Indocin 2)  ! Aleve 3)  ! * Guafenesin 4)  ! * Actonel 5)  ! 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), IgA DIVERTICULAR DISEASE (ICD-562.10) DEGENERATIVE JOINT DISEASE (ICD-715.90) UNSPECIFIED DISORDER OF THYROID (ICD-246.9) FATTY LIVER DISEASE (ICD-571.8) HYPERTENSION (ICD-401.9) DIABETES MELLITUS, TYPE II, CONTROLLED (ICD-250.00) DYSPNEA ON EXERTION (ICD-786.09) HYPERLIPIDEMIA NEC/NOS (ICD-272.4) SYMPTOM, EDEMA (ICD-782.3) SPINAL STENOSIS (ICD-724.00) MYELOMA, MULTIPLE IN REMISSION (ICD-203.01) OSTEOPOROSIS (ICD-733.00) COLONIC POLYPS, HX OF (ICD-V12.72) ASTHMA (ICD-493.90) ANEMIA-NOS (ICD-285.9)  Review of Systems General:  Complains of loss of appetite; denies chills, fever, and sweats; Weight loss >50#  with decreased portions & anorexia. restless sleep. Eyes:  Denies blurring, double vision, and vision loss-both eyes. ENT:  Denies difficulty swallowing and  hoarseness. CV:  Denies palpitations. Resp:  Denies excessive snoring, hypersomnolence, and morning headaches. GU:  Complains of incontinence and urinary frequency. Derm:  Denies changes in nail beds, dryness, hair loss, and poor wound healing. Neuro:  Denies numbness and tingling; Burning, N&T, & pain in legs. Endo:  Complains of cold intolerance and excessive urination; denies excessive hunger, excessive thirst, and heat intolerance.  Physical Exam  General:  well-nourished,in no acute distress; alert,appropriate and cooperative throughout examination,overweight-appearing.   Eyes:  No corneal or conjunctival inflammation noted.Perrla. No lid lag Neck:  No deformities, masses, or tenderness noted. Lungs:  Normal respiratory effort, chest expands symmetrically. Lungs are clear to auscultation, no crackles or wheezes. Heart:  Normal rate and regular rhythm. S1 and S2 normal without gallop, murmur, click, rub .S4 Pulses:  R and L carotid,radial,dorsalis pedis  pulses are full and equal bilaterally. Decreased PTP Extremities:  Severe DJD of hands. Lipidema bilaterally Neurologic:  alert & oriented X3 and DTRs symmetrical  but 0+ @ knees Skin:  Intact without suspicious lesions or rashes Cervical Nodes:  No lymphadenopathy noted Axillary Nodes:  No palpable lymphadenopathy Psych:  memory intact for recent and remote, normally interactive, and good eye contact.     Impression & Recommendations:  Problem # 1:  PERIPHERAL NEUROPATHY, LOWER EXTREMITIES, BILATERAL (ICD-356.9)  Orders: Venipuncture (56433)  Problem # 2:  HYPOTHYROIDISM (ICD-244.9)  Her updated medication list for this problem includes:    Synthroid 75 Mcg Tabs (Levothyroxine sodium) .Marland Kitchen... 1 by mouth qd  Orders: Venipuncture (29518) TLB-TSH (Thyroid Stimulating Hormone) (84443-TSH)  Problem # 3:  DIABETES MELLITUS, TYPE II, CONTROLLED (ICD-250.00)  Her updated medication list  for this problem includes:    Aspirin  Adult Low Strength 81 Mg Tbec (Aspirin) .Marland Kitchen... 1 by mouth once daily  Orders: Venipuncture (04540) TLB-A1C / Hgb A1C (Glycohemoglobin) (83036-A1C) TLB-Microalbumin/Creat Ratio, Urine (82043-MALB) TLB-Creatinine, Blood (82565-CREA) TLB-Potassium (K+) (84132-K) TLB-BUN (Urea Nitrogen) (84520-BUN)  Problem # 4:  HYPERLIPIDEMIA NEC/NOS (ICD-272.4)  Her updated medication list for this problem includes:    Simvastatin 20 Mg Tabs (Simvastatin) .Marland Kitchen... 1 by mouth qhs  Orders: Venipuncture (98119) TLB-Lipid Panel (80061-LIPID) TLB-Hepatic/Liver Function Pnl (80076-HEPATIC)  Complete Medication List: 1)  Synthroid 75 Mcg Tabs (Levothyroxine sodium) .Marland Kitchen.. 1 by mouth qd 2)  Furosemide 40 Mg Tabs (Furosemide) .Marland Kitchen.. 1 by mouth once daily 3)  Klor-con M20 20 Meq Tbcr (Potassium chloride crys cr) .... 2 once daily 4)  Simvastatin 20 Mg Tabs (Simvastatin) .Marland Kitchen.. 1 by mouth qhs 5)  Amitriptyline Hcl 10 Mg Tabs (Amitriptyline hcl) .Marland Kitchen.. 1 by mouth qhs 6)  Aspirin Adult Low Strength 81 Mg Tbec (Aspirin) .Marland Kitchen.. 1 by mouth once daily 7)  Imodium A-d 2 Mg Tabs (Loperamide hcl) .... 2 by mouth once daily 8)  Omeprazole 20 Mg Cpdr (Omeprazole) .Marland Kitchen.. 1 once daily 9)  Neurontin 600 Mg Tabs (Gabapentin) .Marland Kitchen.. 1 by mouth three times a day 10)  Dilaudid 4 Mg Tabs (Hydromorphone hcl) .Marland Kitchen.. 1 by mouth every 6 hours as needed 11)  Doxycycline Hyclate 50 Mg Caps (Doxycycline hyclate) .... Two times a day

## 2010-11-10 NOTE — Letter (Signed)
Summary: [facility]  [facility]   Imported By: Lanelle Bal 10/13/2010 10:18:38  _____________________________________________________________________  External Attachment:    Type:   Image     Comment:   External Document

## 2010-11-10 NOTE — Assessment & Plan Note (Signed)
Summary: COLD/KN   Vital Signs:  Patient profile:   77 year old female Height:      63.5 inches (161.29 cm) Weight:      179 pounds (81.36 kg) BMI:     31.32 O2 Sat:      93 % on Room air Temp:     98.1 degrees F (36.72 degrees C) oral Pulse rate:   94 / minute Resp:     15 per minute BP sitting:   124 / 60  (left arm)  Vitals Entered By: Lucious Groves CMA (October 11, 2010 12:29 PM)  O2 Flow:  Room air CC: C/O cold./kb, URI symptoms Comments Patient notes that she has been having SOB, sore throat, cough with green mucous production, congestion, HA, and chest/side discomfort. She denies fever.   Primary Care Provider:  Marga Melnick MD  CC:  C/O cold./kb and URI symptoms.  History of Present Illness:      This is an 77 year old woman who presents with RTI  symptoms; onset 10/05/2010 as "sniffles" & head congestion..  The patient now  reports nasal congestion, purulent nasal discharge, and dry cough, but denies significant  earache.  The patient denies fever, dyspnea, and wheezing.  The patient also reports headache.  The patient denies the following risk factors for Strep sinusitis: unilateral facial pain, tooth pain, and tender adenopathy.   Rx: none  Current Medications (verified): 1)  Synthroid 75 Mcg  Tabs (Levothyroxine Sodium) .Marland Kitchen.. 1 By Mouth Qd 2)  Furosemide 40 Mg  Tabs (Furosemide) .Marland Kitchen.. 1 By Mouth Once Daily 3)  Klor-Con M20 20 Meq  Tbcr (Potassium Chloride Crys Cr) .Marland Kitchen.. 1 Tab Two Times A Day 4)  Simvastatin 20 Mg  Tabs (Simvastatin) .Marland Kitchen.. 1 By Mouth Qhs 5)  Imodium A-D 2 Mg  Tabs (Loperamide Hcl) .... 2 By Mouth Once Daily 6)  Dilaudid 4 Mg Tabs (Hydromorphone Hcl) .Marland Kitchen.. 1 By Mouth Every 6 Hours As Needed 7)  Nexium 20 Mg Cpdr (Esomeprazole Magnesium) .Marland Kitchen.. 1 Tab Once Daily 8)  Voltaren 1 % Gel (Diclofenac Sodium) .... Use Four Times Daily As Needed 9)  Prochlorperazine Maleate 10 Mg Tabs (Prochlorperazine Maleate) .Marland Kitchen.. 1 Tab Every 6 Hours As Needed 10)  Tylenol 8 Hour  650 Mg Cr-Tabs (Acetaminophen) .... As Needed 11)  Amitriptyline Hcl 10 Mg Tabs (Amitriptyline Hcl) .... 2 Tab At Bedtime 12)  Cyclophosphamide 500 Mg Solr (Cyclophosphamide) .... Take As Directed Weekly 13)  Align  Caps (Probiotic Product) .Marland Kitchen.. 1 Cap Once Daily 14)  Dexamethasone 4 Mg Tabs (Dexamethasone) .Marland Kitchen.. 10 Tabs Once A Week 15)  Zantac 150 Mg Tabs (Ranitidine Hcl) .... As Needed 16)  Metoprolol Succinate 50 Mg Xr24h-Tab (Metoprolol Succinate) .Marland Kitchen.. 1 Tab Once Daily 17)  Furosemide 20 Mg Tabs (Furosemide) .Marland Kitchen.. 1 Tab in The Afternoon 18)  Prochlorperazine Maleate 5 Mg Tabs (Prochlorperazine Maleate) .Marland Kitchen.. 1 Tab Q 6 Hours As Needed  Allergies (verified): 1)  ! Indocin 2)  ! Aleve 3)  ! * Guafenesin 4)  ! * Actonel 5)  ! Codeine 6)  ! * Naproxen Sodium 7)  ! * Cladribine  Physical Exam  General:  in no acute distress; alert,appropriate and cooperative throughout examination Ears:  External ear exam shows no significant lesions or deformities.  Otoscopic examination reveals clear canals, tympanic membranes are intact bilaterally without bulging, retraction, inflammation or discharge. Hearing is grossly normal bilaterally. Nose:  External nasal examination shows no deformity or inflammation. Nasal mucosa are  dry  without lesions or exudates. Mouth:  Oral mucosa and oropharynx without lesions or exudates.  Teeth in good repair. Lungs:  Normal respiratory effort, chest expands symmetrically. Lungs are clear to auscultation; minimal bibasilar crackles  w/o  wheezes. Heart:  Normal rate and regular rhythm. S1 and S2 normal without gallop,  click, rub .S4; grade 1 systolic murmur Cervical Nodes:  No lymphadenopathy noted Axillary Nodes:  No palpable lymphadenopathy   Impression & Recommendations:  Problem # 1:  ACUTE SINUSITIS, UNSPECIFIED (ICD-461.9)  The following medications were removed from the medication list:    Smz-tmp Ds 800-160 Mg Tabs (Sulfamethoxazole-trimethoprim) .Marland Kitchen... 1  two times a day with 8 oz water Her updated medication list for this problem includes:    Amoxicillin 500 Mg Caps (Amoxicillin) .Marland Kitchen... 1 three times a day    Benzonatate 100 Mg Caps (Benzonatate) .Marland Kitchen... 1 every 6-8 hrs as needed for cough  Orders: Prescription Created Electronically [phone])  Complete Medication List: 1)  Synthroid 75 Mcg Tabs (Levothyroxine sodium) .Marland Kitchen.. 1 by mouth qd 2)  Furosemide 40 Mg Tabs (Furosemide) .Marland Kitchen.. 1 by mouth once daily 3)  Klor-con M20 20 Meq Tbcr (Potassium chloride crys cr) .Marland Kitchen.. 1 tab two times a day 4)  Simvastatin 20 Mg Tabs (Simvastatin) .Marland Kitchen.. 1 by mouth qhs 5)  Imodium A-d 2 Mg Tabs (Loperamide hcl) .... 2 by mouth once daily 6)  Dilaudid 4 Mg Tabs (Hydromorphone hcl) .Marland Kitchen.. 1 by mouth every 6 hours as needed 7)  Nexium 20 Mg Cpdr (Esomeprazole magnesium) .Marland Kitchen.. 1 tab once daily 8)  Voltaren 1 % Gel (Diclofenac sodium) .... Use four times daily as needed 9)  Prochlorperazine Maleate 10 Mg Tabs (Prochlorperazine maleate) .Marland Kitchen.. 1 tab every 6 hours as needed 10)  Tylenol 8 Hour 650 Mg Cr-tabs (Acetaminophen) .... As needed 11)  Amitriptyline Hcl 10 Mg Tabs (Amitriptyline hcl) .... 2 tab at bedtime 12)  Cyclophosphamide 500 Mg Solr (Cyclophosphamide) .... Take as directed weekly 13)  Align Caps (Probiotic product) .Marland Kitchen.. 1 cap once daily 14)  Dexamethasone 4 Mg Tabs (Dexamethasone) .Marland Kitchen.. 10 tabs once a week 15)  Zantac 150 Mg Tabs (Ranitidine hcl) .... As needed 16)  Metoprolol Succinate 50 Mg Xr24h-tab (Metoprolol succinate) .Marland Kitchen.. 1 tab once daily 17)  Furosemide 20 Mg Tabs (Furosemide) .Marland Kitchen.. 1 tab in the afternoon 18)  Prochlorperazine Maleate 5 Mg Tabs (Prochlorperazine maleate) .Marland Kitchen.. 1 tab q 6 hours as needed 19)  Amoxicillin 500 Mg Caps (Amoxicillin) .Marland Kitchen.. 1 three times a day 20)  Benzonatate 100 Mg Caps (Benzonatate) .Marland Kitchen.. 1 every 6-8 hrs as needed for cough  Patient Instructions: 1)  Drink as much NON dairy fluid as you can tolerate for the next few  days. Prescriptions: BENZONATATE 100 MG CAPS (BENZONATATE) 1 every 6-8 hrs as needed for cough  #15 x 0   Entered and Authorized by:   Marga Melnick MD   Signed by:   Marga Melnick MD on 10/11/2010   Method used:   Electronically to        [facility] [id number] (retail)       [address] [place], [place]  [address]       Ph: [phone]       Fax: [phone]   RxID:   [id number] AMOXICILLIN 500 MG CAPS (AMOXICILLIN) 1 three times a day  #30 x 0   Entered and Authorized by:  Marga Melnick MD   Signed by:   Marga Melnick MD on 10/11/2010   Method used:   Electronically to        [facility] [address] (retail)       [address] [place], [place]  [address]       Ph: [phone]       Fax: [phone]   RxID:   [id number]    Orders Added: 1)  Est. Patient Level III [40102] 2)  Prescription Created Electronically [phone]

## 2010-11-10 NOTE — Progress Notes (Signed)
Summary: med refill  Phone Note Refill Request   Refills Requested: Medication #1:  AMITRIPTYLINE HCL 10 MG TABS 2 tab at bedtime   [facility]  Medication #2:  KLOR-CON M20 20 MEQ  TBCR 1 tab two times a day Initial call taken by: Lucious Groves CMA,  October 17, 2010 9:32 AM    Prescriptions: AMITRIPTYLINE HCL 10 MG TABS (AMITRIPTYLINE HCL) 2 tab at bedtime  #180 x 0   Entered by:   Lucious Groves CMA   Authorized by:   Marga Melnick MD   Signed by:   Lucious Groves CMA on 10/17/2010   Method used:   Printed then faxed to ...       [facility])             , [place]         Ph: [phone]       Fax: [phone]   RxID:   [id number] KLOR-CON M20 20 MEQ  TBCR (POTASSIUM CHLORIDE CRYS CR) 1 tab two times a day  #90 x 2   Entered by:   Lucious Groves CMA   Authorized by:   Marga Melnick MD   Signed by:   Lucious Groves CMA on 10/17/2010   Method used:   Printed then faxed to ...       [facility])             , [place]         Ph: [phone]       Fax: [phone]   RxID:   [id number]

## 2010-11-15 ENCOUNTER — Other Ambulatory Visit: Payer: Self-pay | Admitting: Oncology

## 2010-11-15 ENCOUNTER — Encounter (HOSPITAL_BASED_OUTPATIENT_CLINIC_OR_DEPARTMENT_OTHER): Payer: Medicare Other | Admitting: Oncology

## 2010-11-15 DIAGNOSIS — C9000 Multiple myeloma not having achieved remission: Secondary | ICD-10-CM

## 2010-11-15 DIAGNOSIS — Z5112 Encounter for antineoplastic immunotherapy: Secondary | ICD-10-CM

## 2010-11-15 LAB — CBC WITH DIFFERENTIAL/PLATELET
BASO%: 0.9 % (ref 0.0–2.0)
Basophils Absolute: 0 10*3/uL (ref 0.0–0.1)
Eosinophils Absolute: 0.1 10*3/uL (ref 0.0–0.5)
HCT: 32.4 % — ABNORMAL LOW (ref 34.8–46.6)
HGB: 11 g/dL — ABNORMAL LOW (ref 11.6–15.9)
MONO#: 0.4 10*3/uL (ref 0.1–0.9)
NEUT#: 1.1 10*3/uL — ABNORMAL LOW (ref 1.5–6.5)
NEUT%: 50.9 % (ref 38.4–76.8)
WBC: 2.1 10*3/uL — ABNORMAL LOW (ref 3.9–10.3)
lymph#: 0.5 10*3/uL — ABNORMAL LOW (ref 0.9–3.3)

## 2010-11-22 ENCOUNTER — Other Ambulatory Visit: Payer: Self-pay | Admitting: Oncology

## 2010-11-22 ENCOUNTER — Encounter (HOSPITAL_BASED_OUTPATIENT_CLINIC_OR_DEPARTMENT_OTHER): Payer: Medicare Other | Admitting: Oncology

## 2010-11-22 DIAGNOSIS — Z5112 Encounter for antineoplastic immunotherapy: Secondary | ICD-10-CM

## 2010-11-22 DIAGNOSIS — C9000 Multiple myeloma not having achieved remission: Secondary | ICD-10-CM

## 2010-11-22 LAB — CBC WITH DIFFERENTIAL/PLATELET
Basophils Absolute: 0 10*3/uL (ref 0.0–0.1)
EOS%: 2.1 % (ref 0.0–7.0)
HCT: 30 % — ABNORMAL LOW (ref 34.8–46.6)
HGB: 10.6 g/dL — ABNORMAL LOW (ref 11.6–15.9)
MCH: 35.7 pg — ABNORMAL HIGH (ref 25.1–34.0)
MCV: 101.3 fL — ABNORMAL HIGH (ref 79.5–101.0)
NEUT%: 58.3 % (ref 38.4–76.8)
Platelets: 101 10*3/uL — ABNORMAL LOW (ref 145–400)
lymph#: 0.5 10*3/uL — ABNORMAL LOW (ref 0.9–3.3)

## 2010-11-29 ENCOUNTER — Encounter (HOSPITAL_BASED_OUTPATIENT_CLINIC_OR_DEPARTMENT_OTHER): Payer: Medicare Other | Admitting: Oncology

## 2010-11-29 ENCOUNTER — Other Ambulatory Visit: Payer: Self-pay | Admitting: Oncology

## 2010-11-29 DIAGNOSIS — C9000 Multiple myeloma not having achieved remission: Secondary | ICD-10-CM

## 2010-11-29 DIAGNOSIS — Z5112 Encounter for antineoplastic immunotherapy: Secondary | ICD-10-CM

## 2010-11-29 LAB — CBC WITH DIFFERENTIAL/PLATELET
Basophils Absolute: 0 10*3/uL (ref 0.0–0.1)
EOS%: 1.2 % (ref 0.0–7.0)
HCT: 32.3 % — ABNORMAL LOW (ref 34.8–46.6)
HGB: 11.2 g/dL — ABNORMAL LOW (ref 11.6–15.9)
MCH: 34.9 pg — ABNORMAL HIGH (ref 25.1–34.0)
MCV: 100.6 fL (ref 79.5–101.0)
MONO%: 22 % — ABNORMAL HIGH (ref 0.0–14.0)
NEUT%: 52 % (ref 38.4–76.8)
RDW: 12.9 % (ref 11.2–14.5)

## 2010-12-13 ENCOUNTER — Encounter (HOSPITAL_BASED_OUTPATIENT_CLINIC_OR_DEPARTMENT_OTHER): Payer: Medicare Other | Admitting: Oncology

## 2010-12-13 ENCOUNTER — Other Ambulatory Visit: Payer: Self-pay | Admitting: Oncology

## 2010-12-13 DIAGNOSIS — D638 Anemia in other chronic diseases classified elsewhere: Secondary | ICD-10-CM

## 2010-12-13 DIAGNOSIS — Z5112 Encounter for antineoplastic immunotherapy: Secondary | ICD-10-CM

## 2010-12-13 DIAGNOSIS — C9000 Multiple myeloma not having achieved remission: Secondary | ICD-10-CM

## 2010-12-13 DIAGNOSIS — D6959 Other secondary thrombocytopenia: Secondary | ICD-10-CM

## 2010-12-13 LAB — CBC WITH DIFFERENTIAL/PLATELET
BASO%: 0 % (ref 0.0–2.0)
EOS%: 2.2 % (ref 0.0–7.0)
HCT: 30.8 % — ABNORMAL LOW (ref 34.8–46.6)
MCH: 35.2 pg — ABNORMAL HIGH (ref 25.1–34.0)
MCHC: 34.5 g/dL (ref 31.5–36.0)
MCV: 102.2 fL — ABNORMAL HIGH (ref 79.5–101.0)
MONO%: 16.9 % — ABNORMAL HIGH (ref 0.0–14.0)
NEUT%: 55.4 % (ref 38.4–76.8)
RDW: 13.7 % (ref 11.2–14.5)
lymph#: 0.8 10*3/uL — ABNORMAL LOW (ref 0.9–3.3)

## 2010-12-13 LAB — COMPREHENSIVE METABOLIC PANEL
ALT: 13 U/L (ref 0–35)
Alkaline Phosphatase: 42 U/L (ref 39–117)
CO2: 26 mEq/L (ref 19–32)
Creatinine, Ser: 0.72 mg/dL (ref 0.40–1.20)
Glucose, Bld: 99 mg/dL (ref 70–99)
Sodium: 141 mEq/L (ref 135–145)
Total Bilirubin: 0.8 mg/dL (ref 0.3–1.2)
Total Protein: 6.4 g/dL (ref 6.0–8.3)

## 2010-12-13 LAB — IGM: IgM, Serum: 346 mg/dL — ABNORMAL HIGH (ref 60–263)

## 2010-12-20 ENCOUNTER — Other Ambulatory Visit: Payer: Self-pay | Admitting: Oncology

## 2010-12-20 ENCOUNTER — Encounter (HOSPITAL_BASED_OUTPATIENT_CLINIC_OR_DEPARTMENT_OTHER): Payer: Medicare Other | Admitting: Oncology

## 2010-12-20 DIAGNOSIS — C9000 Multiple myeloma not having achieved remission: Secondary | ICD-10-CM

## 2010-12-20 DIAGNOSIS — Z5112 Encounter for antineoplastic immunotherapy: Secondary | ICD-10-CM

## 2010-12-20 LAB — CBC WITH DIFFERENTIAL/PLATELET
BASO%: 0.4 % (ref 0.0–2.0)
Basophils Absolute: 0 10*3/uL (ref 0.0–0.1)
HCT: 26.6 % — ABNORMAL LOW (ref 34.8–46.6)
HGB: 9.5 g/dL — ABNORMAL LOW (ref 11.6–15.9)
LYMPH%: 19.4 % (ref 14.0–49.7)
MCHC: 35.7 g/dL (ref 31.5–36.0)
MONO#: 0.3 10*3/uL (ref 0.1–0.9)
NEUT%: 60.5 % (ref 38.4–76.8)
Platelets: 94 10*3/uL — ABNORMAL LOW (ref 145–400)
WBC: 1.9 10*3/uL — ABNORMAL LOW (ref 3.9–10.3)
lymph#: 0.4 10*3/uL — ABNORMAL LOW (ref 0.9–3.3)

## 2010-12-25 LAB — BASIC METABOLIC PANEL
BUN: 18 mg/dL (ref 6–23)
BUN: 28 mg/dL — ABNORMAL HIGH (ref 6–23)
Chloride: 103 mEq/L (ref 96–112)
Chloride: 105 mEq/L (ref 96–112)
GFR calc Af Amer: 59 mL/min — ABNORMAL LOW (ref >60)
GFR calc Af Amer: >60 mL/min (ref >60)
GFR calc Af Amer: >60 mL/min (ref >60)
GFR calc non Af Amer: 49 mL/min — ABNORMAL LOW (ref >60)
Potassium: 3 mEq/L — ABNORMAL LOW (ref 3.5–5.1)
Potassium: 3.4 mEq/L — ABNORMAL LOW (ref 3.5–5.1)
Potassium: 4.1 mEq/L (ref 3.5–5.1)
Sodium: 137 mEq/L (ref 135–145)
Sodium: 138 mEq/L (ref 135–145)

## 2010-12-25 LAB — CBC
HCT: 23.8 % — ABNORMAL LOW (ref 36.0–46.0)
HCT: 23.9 % — ABNORMAL LOW (ref 36.0–46.0)
HCT: 24.5 % — ABNORMAL LOW (ref 36.0–46.0)
HCT: 26.1 % — ABNORMAL LOW (ref 36.0–46.0)
Hemoglobin: 8.8 g/dL — ABNORMAL LOW (ref 12.0–15.0)
MCV: 95.2 fL (ref 78.0–100.0)
MCV: 96.2 fL (ref 78.0–100.0)
Platelets: 44 10*3/uL — ABNORMAL LOW (ref 150–400)
RBC: 2.49 MIL/uL — ABNORMAL LOW (ref 3.87–5.11)
RBC: 2.51 MIL/uL — ABNORMAL LOW (ref 3.87–5.11)
RBC: 2.55 MIL/uL — ABNORMAL LOW (ref 3.87–5.11)
RBC: 2.72 MIL/uL — ABNORMAL LOW (ref 3.87–5.11)
WBC: 3.9 10*3/uL — ABNORMAL LOW (ref 4.0–10.5)
WBC: 4.2 10*3/uL (ref 4.0–10.5)
WBC: 4.2 10*3/uL (ref 4.0–10.5)
WBC: 7.8 10*3/uL (ref 4.0–10.5)

## 2010-12-25 LAB — COMPREHENSIVE METABOLIC PANEL
ALT: 24 U/L (ref 0–35)
Alkaline Phosphatase: 38 U/L — ABNORMAL LOW (ref 39–117)
BUN: 32 mg/dL — ABNORMAL HIGH (ref 6–23)
CO2: 24 mEq/L (ref 19–32)
Chloride: 99 mEq/L (ref 96–112)
Glucose, Bld: 182 mg/dL — ABNORMAL HIGH (ref 70–99)
Potassium: 3.6 mEq/L (ref 3.5–5.1)
Sodium: 135 mEq/L (ref 135–145)
Total Bilirubin: 0.4 mg/dL (ref 0.3–1.2)
Total Protein: 11.4 g/dL — ABNORMAL HIGH (ref 6.0–8.3)

## 2010-12-25 LAB — CARDIAC PANEL(CRET KIN+CKTOT+MB+TROPI)
CK, MB: 1.2 ng/mL (ref 0.3–4.0)
CK, MB: 1.2 ng/mL (ref 0.3–4.0)
Relative Index: INVALID (ref 0.0–2.5)
Total CK: 16 U/L (ref 7–177)
Total CK: 17 U/L (ref 7–177)
Troponin I: 0.02 ng/mL (ref 0.00–0.06)

## 2010-12-25 LAB — GLUCOSE, CAPILLARY
Glucose-Capillary: 119 mg/dL — ABNORMAL HIGH (ref 70–99)
Glucose-Capillary: 128 mg/dL — ABNORMAL HIGH (ref 70–99)

## 2010-12-25 LAB — TYPE AND SCREEN: Antibody Screen: NEGATIVE

## 2010-12-25 LAB — DIFFERENTIAL
Eosinophils Absolute: 0 10*3/uL (ref 0.0–0.7)
Eosinophils Relative: 0 % (ref 0–5)
Lymphocytes Relative: 21 % (ref 12–46)
Lymphs Abs: 1.7 10*3/uL (ref 0.7–4.0)
Monocytes Absolute: 1.5 10*3/uL — ABNORMAL HIGH (ref 0.1–1.0)

## 2010-12-25 LAB — PREPARE RBC (CROSSMATCH)

## 2010-12-26 LAB — DIFFERENTIAL
Basophils Absolute: 0.1 10*3/uL (ref 0.0–0.1)
Eosinophils Relative: 0 % (ref 0–5)
Lymphocytes Relative: 30 % (ref 12–46)
Monocytes Absolute: 1.4 10*3/uL — ABNORMAL HIGH (ref 0.1–1.0)
Monocytes Relative: 16 % — ABNORMAL HIGH (ref 3–12)

## 2010-12-26 LAB — CBC
HCT: 29.7 % — ABNORMAL LOW (ref 36.0–46.0)
MCHC: 33.4 g/dL (ref 30.0–36.0)
MCV: 97.2 fL (ref 78.0–100.0)
RBC: 3.05 MIL/uL — ABNORMAL LOW (ref 3.87–5.11)
WBC: 8.4 10*3/uL (ref 4.0–10.5)

## 2010-12-26 LAB — CROSSMATCH

## 2010-12-26 LAB — COMPREHENSIVE METABOLIC PANEL
AST: 25 U/L (ref 0–37)
Albumin: 3.8 g/dL (ref 3.5–5.2)
Chloride: 107 mEq/L (ref 96–112)
Creatinine, Ser: 1.28 mg/dL — ABNORMAL HIGH (ref 0.4–1.2)
GFR calc Af Amer: 48 mL/min — ABNORMAL LOW (ref >60)
Potassium: 3.7 mEq/L (ref 3.5–5.1)
Total Bilirubin: 1 mg/dL (ref 0.3–1.2)

## 2010-12-27 ENCOUNTER — Encounter (HOSPITAL_BASED_OUTPATIENT_CLINIC_OR_DEPARTMENT_OTHER): Payer: Medicare Other | Admitting: Oncology

## 2010-12-27 ENCOUNTER — Other Ambulatory Visit: Payer: Self-pay | Admitting: Oncology

## 2010-12-27 DIAGNOSIS — Z5112 Encounter for antineoplastic immunotherapy: Secondary | ICD-10-CM

## 2010-12-27 DIAGNOSIS — C9000 Multiple myeloma not having achieved remission: Secondary | ICD-10-CM

## 2010-12-27 LAB — CBC WITH DIFFERENTIAL/PLATELET
Basophils Absolute: 0 10*3/uL (ref 0.0–0.1)
Eosinophils Absolute: 0.1 10*3/uL (ref 0.0–0.5)
HCT: 29.1 % — ABNORMAL LOW (ref 34.8–46.6)
HGB: 9.8 g/dL — ABNORMAL LOW (ref 11.6–15.9)
LYMPH%: 26.4 % (ref 14.0–49.7)
MONO#: 0.5 10*3/uL (ref 0.1–0.9)
NEUT#: 1.5 10*3/uL (ref 1.5–6.5)
NEUT%: 54 % (ref 38.4–76.8)
Platelets: 141 10*3/uL — ABNORMAL LOW (ref 145–400)
WBC: 2.8 10*3/uL — ABNORMAL LOW (ref 3.9–10.3)

## 2010-12-27 LAB — CROSSMATCH

## 2011-01-05 ENCOUNTER — Other Ambulatory Visit: Payer: Self-pay | Admitting: *Deleted

## 2011-01-05 ENCOUNTER — Other Ambulatory Visit: Payer: Self-pay | Admitting: Internal Medicine

## 2011-01-05 MED ORDER — SIMVASTATIN 20 MG PO TABS: 20.0000 mg | ORAL_TABLET | Freq: Every day | ORAL | Status: DC

## 2011-01-05 MED ORDER — FUROSEMIDE 40 MG PO TABS: 40.0000 mg | ORAL_TABLET | Freq: Every day | ORAL | Status: DC

## 2011-01-05 MED ORDER — LEVOTHYROXINE SODIUM 75 MCG PO TABS: 75.0000 ug | ORAL_TABLET | Freq: Every day | ORAL | Status: DC

## 2011-01-05 MED ORDER — ESOMEPRAZOLE MAGNESIUM 20 MG PO CPDR: 20.0000 mg | DELAYED_RELEASE_CAPSULE | Freq: Every day | ORAL | Status: DC

## 2011-01-05 NOTE — Telephone Encounter (Signed)
Lipid/Hep/TSH 244.9/272.4/995.20

## 2011-01-05 NOTE — Telephone Encounter (Signed)
Patient is requesting Zocor 20mg  but it is not on medication list in EMR.

## 2011-01-10 ENCOUNTER — Other Ambulatory Visit: Payer: Self-pay | Admitting: Oncology

## 2011-01-10 ENCOUNTER — Encounter (HOSPITAL_BASED_OUTPATIENT_CLINIC_OR_DEPARTMENT_OTHER): Payer: Medicare Other | Admitting: Oncology

## 2011-01-10 DIAGNOSIS — C9000 Multiple myeloma not having achieved remission: Secondary | ICD-10-CM

## 2011-01-10 DIAGNOSIS — D638 Anemia in other chronic diseases classified elsewhere: Secondary | ICD-10-CM

## 2011-01-10 DIAGNOSIS — Z5112 Encounter for antineoplastic immunotherapy: Secondary | ICD-10-CM

## 2011-01-10 LAB — CBC WITH DIFFERENTIAL/PLATELET
Basophils Absolute: 0 10*3/uL (ref 0.0–0.1)
Eosinophils Absolute: 0.1 10*3/uL (ref 0.0–0.5)
HGB: 10.6 g/dL — ABNORMAL LOW (ref 11.6–15.9)
LYMPH%: 22.8 % (ref 14.0–49.7)
MCV: 102 fL — ABNORMAL HIGH (ref 79.5–101.0)
MONO#: 0.5 10*3/uL (ref 0.1–0.9)
MONO%: 18.5 % — ABNORMAL HIGH (ref 0.0–14.0)
NEUT#: 1.7 10*3/uL (ref 1.5–6.5)
Platelets: 132 10*3/uL — ABNORMAL LOW (ref 145–400)
RDW: 13 % (ref 11.2–14.5)
WBC: 2.9 10*3/uL — ABNORMAL LOW (ref 3.9–10.3)

## 2011-01-10 LAB — COMPREHENSIVE METABOLIC PANEL
Albumin: 4.3 g/dL (ref 3.5–5.2)
Alkaline Phosphatase: 39 U/L (ref 39–117)
BUN: 17 mg/dL (ref 6–23)
CO2: 27 mEq/L (ref 19–32)
Glucose, Bld: 146 mg/dL — ABNORMAL HIGH (ref 70–99)
Potassium: 3.9 mEq/L (ref 3.5–5.3)
Sodium: 143 mEq/L (ref 135–145)
Total Protein: 6.1 g/dL (ref 6.0–8.3)

## 2011-01-10 LAB — IGM: IgM, Serum: 333 mg/dL — ABNORMAL HIGH (ref 60–263)

## 2011-01-12 LAB — PROTEIN ELECTROPHORESIS, SERUM
Albumin ELP: 65.5 % (ref 55.8–66.1)
Beta Globulin: 5.9 % (ref 4.7–7.2)
M-Spike, %: 0.33 g/dL
Total Protein, Serum Electrophoresis: 6.3 g/dL (ref 6.0–8.3)

## 2011-01-17 ENCOUNTER — Other Ambulatory Visit: Payer: Self-pay | Admitting: Oncology

## 2011-01-17 ENCOUNTER — Encounter (HOSPITAL_BASED_OUTPATIENT_CLINIC_OR_DEPARTMENT_OTHER): Payer: Medicare Other | Admitting: Oncology

## 2011-01-17 DIAGNOSIS — C9000 Multiple myeloma not having achieved remission: Secondary | ICD-10-CM

## 2011-01-17 DIAGNOSIS — Z5112 Encounter for antineoplastic immunotherapy: Secondary | ICD-10-CM

## 2011-01-17 LAB — CBC WITH DIFFERENTIAL/PLATELET
Basophils Absolute: 0 10*3/uL (ref 0.0–0.1)
Eosinophils Absolute: 0 10*3/uL (ref 0.0–0.5)
HGB: 10.9 g/dL — ABNORMAL LOW (ref 11.6–15.9)
MCV: 99.7 fL (ref 79.5–101.0)
MONO#: 0.5 10*3/uL (ref 0.1–0.9)
MONO%: 20.7 % — ABNORMAL HIGH (ref 0.0–14.0)
NEUT#: 1.3 10*3/uL — ABNORMAL LOW (ref 1.5–6.5)
RBC: 3.18 10*6/uL — ABNORMAL LOW (ref 3.70–5.45)
RDW: 12.6 % (ref 11.2–14.5)
WBC: 2.4 10*3/uL — ABNORMAL LOW (ref 3.9–10.3)
lymph#: 0.6 10*3/uL — ABNORMAL LOW (ref 0.9–3.3)
nRBC: 0 % (ref 0–0)

## 2011-01-24 ENCOUNTER — Other Ambulatory Visit: Payer: Self-pay | Admitting: Oncology

## 2011-01-24 ENCOUNTER — Encounter (HOSPITAL_BASED_OUTPATIENT_CLINIC_OR_DEPARTMENT_OTHER): Payer: Medicare Other | Admitting: Oncology

## 2011-01-24 DIAGNOSIS — Z5112 Encounter for antineoplastic immunotherapy: Secondary | ICD-10-CM

## 2011-01-24 DIAGNOSIS — C9000 Multiple myeloma not having achieved remission: Secondary | ICD-10-CM

## 2011-01-24 LAB — CBC WITH DIFFERENTIAL/PLATELET
BASO%: 0.3 % (ref 0.0–2.0)
Eosinophils Absolute: 0.1 10*3/uL (ref 0.0–0.5)
HCT: 30.7 % — ABNORMAL LOW (ref 34.8–46.6)
MCHC: 34.2 g/dL (ref 31.5–36.0)
MONO#: 0.6 10*3/uL (ref 0.1–0.9)
NEUT#: 1.7 10*3/uL (ref 1.5–6.5)
NEUT%: 57.6 % (ref 38.4–76.8)
Platelets: 113 10*3/uL — ABNORMAL LOW (ref 145–400)
WBC: 2.9 10*3/uL — ABNORMAL LOW (ref 3.9–10.3)
lymph#: 0.6 10*3/uL — ABNORMAL LOW (ref 0.9–3.3)

## 2011-02-21 ENCOUNTER — Other Ambulatory Visit: Payer: Self-pay | Admitting: Oncology

## 2011-02-21 ENCOUNTER — Encounter (HOSPITAL_BASED_OUTPATIENT_CLINIC_OR_DEPARTMENT_OTHER): Payer: Medicare Other | Admitting: Oncology

## 2011-02-21 DIAGNOSIS — C9000 Multiple myeloma not having achieved remission: Secondary | ICD-10-CM

## 2011-02-21 DIAGNOSIS — E119 Type 2 diabetes mellitus without complications: Secondary | ICD-10-CM

## 2011-02-21 DIAGNOSIS — Z5112 Encounter for antineoplastic immunotherapy: Secondary | ICD-10-CM

## 2011-02-21 DIAGNOSIS — D6959 Other secondary thrombocytopenia: Secondary | ICD-10-CM

## 2011-02-21 DIAGNOSIS — D638 Anemia in other chronic diseases classified elsewhere: Secondary | ICD-10-CM

## 2011-02-21 LAB — CBC WITH DIFFERENTIAL/PLATELET
Basophils Absolute: 0 10*3/uL (ref 0.0–0.1)
HCT: 32.5 % — ABNORMAL LOW (ref 34.8–46.6)
HGB: 11.1 g/dL — ABNORMAL LOW (ref 11.6–15.9)
LYMPH%: 18.9 % (ref 14.0–49.7)
MCH: 33.7 pg (ref 25.1–34.0)
MONO#: 0.7 10*3/uL (ref 0.1–0.9)
NEUT%: 65.3 % (ref 38.4–76.8)
Platelets: 131 10*3/uL — ABNORMAL LOW (ref 145–400)
WBC: 5.2 10*3/uL (ref 3.9–10.3)
lymph#: 1 10*3/uL (ref 0.9–3.3)

## 2011-02-21 NOTE — Assessment & Plan Note (Signed)
[facility]                                 ON-CALL NOTE   NAME:Pella, LOTTA FRANKENFIELD                      MRN:          [id number]  DATE:05/02/2007                            DOB:          10-04-1926    Mrs. Bir called tonight complaining of lower abdominal pain with  chills.  She had undergone an endoscopy and colonoscopy earlier in the  day.  She was instructed to go to [facility] emergency room for  evaluation.     Barbette Hair. Arlyce Dice, MD,FACG  Electronically Signed    RDK/MedQ  DD: 05/02/2007  DT: 05/02/2007  Job #: [id number]   cc:   Vania Rea. Jarold Motto, MD, Caleen Essex, FAGA

## 2011-02-21 NOTE — Assessment & Plan Note (Signed)
[facility]                         GASTROENTEROLOGY OFFICE NOTE   NAME:Ballow, ELLIOT SIMONEAUX                      MRN:          [id number]  DATE:06/18/2007                            DOB:          18-Mar-1926    [place] was treated empirically for bacterial overgrowth syndrome with  Xifaxan 400 mg t.i.d. along with a daily Algin probiotic therapy.  She  had no improvement in her 4-5 semi-loose bowel movements a day.  She  denies abdominal pain and her gas and bloating is better.  She has no  nocturnal diarrhea but does use p.r.n. Imodium.  She has had an  extensive negative GI workup and her symptoms seem most consistent with  diarrhea predominant IBS.   RECOMMENDATIONS:  1. Check IBS serology through [facility] in [place].  2. Hold Zocor which may be contributing to her diarrhea.  3. Continue diabetic management.  4. Consideration of low dose of Lotronex 0.5 mg twice a day.  I have      given this patient some information and review concerning Lotronex      and I am somewhat weary of using it since after her colonoscopy she      was admitted with unexplained colitis on May 02, 2007.  It is      certainly possible this may have been a low grade ischemic colitis      related to her bowel prep and colonoscopy exam.  5. Continue hematology followup with Dr. Arbutus Ped for what sounds like      plasma cell dyscrasia.     Vania Rea. Jarold Motto, MD, Caleen Essex, FAGA  Electronically Signed    DRP/MedQ  DD: 06/18/2007  DT: 06/18/2007  Job #: [id number]   cc:   Lajuana Matte, MD  Titus Dubin. Alwyn Ren, MD,FACP,FCCP

## 2011-02-21 NOTE — Assessment & Plan Note (Signed)
[facility]                         GASTROENTEROLOGY OFFICE NOTE   NAME:Pankowski, VONYA OHALLORAN                      MRN:          [id number]  DATE:05/23/2007                            DOB:          October 08, 1926    Mrs. Kotas was hospitalized after colonoscopy on May 01, 2007.  She had  a normal colonoscopy except for diverticulosis.  Multiple biopsies were  obtained and were normal, without evidence of lymphocytic colitis.  She  had this procedure because of underlying chronic abdominal pain, gas,  bloating, and diarrhea.  She also had endoscopy that day which was  unremarkable, and small bowel biopsies were obtained and were normal.   Apparently, she was admitted with abdominal pain and fever, and had a CT  scan which showed some thickening of the transverse colon.  All stool  cultures and blood cultures were negative, and she was empirically  treated with metronidazole and Cipro . She is back to her baseline  status of having chronic diarrhea, abdominal pain, gas, and bloating.  I  cannot really tell any difference in her symptoms from previous visits.  She continues to complain mostly of right upper quadrant pain which I  think is from fatty infiltration of her liver and a chronic watery  diarrhea which has been going on for years which defies diagnosis.  I  spoke with Mike Gip, our PA, today, and her colonoscopy was not  repeated during her hospitalization.  She has a chronic normochromic,  normocytic anemia, with normal B12, folate, and I believe iron levels.  She does not have peripheral vascular disease and has had negative  Doppler exams, negative coronary angiography, making diffuse ischemic  colitis unlikely.   PHYSICAL EXAMINATION:  GENERAL:  She is awake and alert today and  appears to be in no acute distress, unchanged from any previous exam.  VITAL SIGNS:  She weighs 227 pounds, which is down 8 pounds from her  last visit.  Blood pressure  128/58, and pulse was 100 and regular.  ABDOMEN:  Essentially unchanged, without definite organomegaly, masses,  or significant tenderness.  Bowel sounds were normal.  The patient  certainly appeared normal, healthy, and nontoxic.   ASSESSMENT:  Mrs. Hallett problems have been going on for many, many  years, and I still think she has diarrhea-predominant irritable bowel  syndrome.  She may well have had an episode of ischemic colitis after  colonoscopy prep and double procedure, although we usually keep these  patients well hydrated during the procedure.  In any case, I think she  is back to her background status.   RECOMMENDATIONS:  1. Trial of Xifaxan 400 mg t.i.d. for 10 days, along with probiotic      therapy to see if there is an element of bacterial overgrowth      syndrome.  This is looking more unlikely, with no response to      metronidazole and ciprofloxacin.  2. Probable diarrhea-predominant irritable bowel syndrome, and she may      benefit from Lotronex therapy.  3. Fatty infiltration of the liver, and a recent negative ultrasound  of the abdomen in July 2008, without any evidence of      cholelithiasis.  4. Status post hysterectomy and appendectomy.  5. Multiple medical problems, as mentioned above, including chronic      reflux disease, chronic thyroid dysfunction, hypertension,      hyperlipidemia, multiple orthopedic procedures for degenerative      arthritis, obesity, deconditioning, and chronic dyspnea.   RECOMMENDATIONS:  1. Treat for bacterial overgrowth, as mentioned above.  2. GI followup in 2-3 weeks' time, and consider Lotronex therapy      versus tertiary medical center referral.   ADDENDUM:  The patient apparently is being followed by Dr. Truett Perna for  some type of plasma cell disorder, perhaps early multiple myeloma.  The  patient also has a long history of multiple drug allergies, including  all NSAIDs.     Vania Rea. Jarold Motto, MD, Caleen Essex,  FAGA  Electronically Signed    DRP/MedQ  DD: 05/23/2007  DT: 05/24/2007  Job #: [id number]   cc:   Titus Dubin. Alwyn Ren, MD,FACP,FCCP  Leighton Roach Truett Perna, M.D.

## 2011-02-21 NOTE — H&P (Signed)
NAME:  Karen Barron, Karen Barron               ACCOUNT NO.:  [id number]   MEDICAL RECORD NO.:  [id number]          PATIENT TYPE:  INP   LOCATION:  1323                         FACILITY:  [facility]   PHYSICIAN:  Barbette Hair. Arlyce Dice, MD,FACGDATE OF BIRTH:  [birth year]   DATE OF ADMISSION:  05/02/2007  DATE OF DISCHARGE:                              HISTORY & PHYSICAL   CHIEF COMPLAINT:  Abdominal pain, diarrhea, fever or chills, post  colonoscopy, May 01, 2007.   HISTORY:  [place] is a pleasant 77 year old white female, primary  patient of Dr. Alwyn Ren, known to Dr. Sheryn Bison.  She has history of  hypertension, hypothyroidism, adult-onset diabetes mellitus, peripheral  neuropathy, hyperlipidemia and has multiple myeloma with chronic anemia,  followed by Dr. Truett Perna.  The patient had colonoscopy and EGD done the  day prior to admission at the [facility] for evaluation of  complaints of recurrent upper abdominal pain, nausea and diarrhea.  She  says generally she has been having 4-5 bowel movements per day.  EGD was  negative.  She did have small bowel biopsies taken and colonoscopy was  negative with the exception of left colon diverticulosis and spasm.  She  had random biopsies done.   The patient says she felt okay post procedure and then about 5 p.m. on  July 23, woke up after taking a nap at home with rather diffuse  abdominal discomfort, chills, fever and then multiple episodes of  diarrhea.  She says she has 10-12 bowel movements prior to coming to the  emergency room.  She has had associated nausea, no vomiting, no evidence  of melena or hematochezia.  In the emergency room, WBC of 9.6 with 81%  segs, hemoglobin 9, hematocrit of 25, platelets 128,000.  Electrolytes  within normal limits.  LFTs normal.  UA negative.  KUB and chest x-ray:  Chronic lung changes and nonspecific bowel gas pattern.  Abdominal  ultrasound was also done on 07/23 because of her complaints of abdominal  pain and this shows abdominal aorta 1.7 cm, otherwise normal exam, some  increased echodensity of the liver suggestive of fatty infiltration.   CURRENT MEDICATIONS:  1. Nexium 40 mg daily.  2. Synthroid 75 mcg daily.  3. Zocor 20 daily.  4. Teveten 600 mg daily.  5. Lasix 40 mg b.i.d.  6. Amitriptyline 10 mg nightly.  7. Lyrica 75 mg b.i.d..  8. Metformin 500 mg daily.  9. Chromagen supplement daily.  10.Centrum Silver.  11.Os-Cal.  12.Glucosamine.  13.Fish oil.   INTOLERANCES:  INDOCIN, ALEVE and GUAIFENESIN.   PAST HISTORY:  As outlined above.  She is also status post appendectomy  and hysterectomy.  She has had 3 separate laminectomies, 2 knee  replacements and a shoulder repair.   FAMILY HISTORY:  Negative for GI disease.   SOCIAL HISTORY:  The patient is married, retired.  No tobacco and no  EtOH.   REVIEW OF SYSTEMS:  GI:  As outlined above.  GENERAL:  The patient  complains of weakness, fatigue and chills.  PULMONARY:  She does have  some chronic dyspnea, no current cough.  CARDIOVASCULAR:  Negative for  chest pain or anginal symptoms.  MUSCULOSKELETAL:  Pertinent for chronic  lower extremity discomfort with her neuropathy.  All other review of  systems negative.   PHYSICAL EXAM:  GENERAL:  Well-developed elderly white female in no  acute distress.  She is flushed.  VITAL SIGNS:  Temperature is 102.7 max, blood pressure 108/44, pulse in  the 80s.  SAT is 98% on room air.  HEENT: Nontraumatic, normocephalic.  EOMI, PERRLA.  Sclerae anicteric.  NECK:  There is no JVD.  SKIN:  Flushed.  CARDIOVASCULAR:  Regular rate and rhythm with S1 and S2, no murmur, rub  or gallop.  PULMONARY:  Clear to A&P.  ABDOMEN:  Slightly distended.  Bowel sounds are active.  She is tender  rather diffusely, greatest in the left lower quadrant with some rebound.  There is no mass or hepatosplenomegaly.  RECTAL:  Exam not done at this time.  EXTREMITIES:  Without clubbing, cyanosis or  edema.  SKIN:  Without rash or edema.  NEUROLOGIC:  Grossly nonfocal.  The patient is alert and oriented x3.   IMPRESSION:  44. A 77 year old white female, status post upper endoscopy and      colonoscopy, May 01, 2007, per Dr. Jarold Motto, done for complaints      of abdominal discomfort and diarrhea, now with acute abdominal      pain, fever, multiple episodes of diarrhea within 24 hours post      colonoscopy, rule out infectious colitis, ischemic colitis,      diverticulitis, though doubt, or acute viral gastroenteritis.  2. Multiple myeloma with chronic anemia and mild thrombocytopenia.  3. Adult-onset diabetes mellitus, on oral agent.  4. Hypertension.  5. Hyperlipidemia.  6. Status post appendectomy, hysterectomy and multiple orthopedic      procedures.   PLAN:  Admit the patient for IV fluid hydration, stool cultures, chest x-  ray and blood cultures.  She will be covered empirically with Cipro and  Flagyl.  We will place her on a clear liquid diet and will plan CT scan  of the abdomen and pelvis.  For details, please see the orders.     Amy Esterwood, PA-C      Robert D. Arlyce Dice, MD,FACG  Electronically Signed   AE/MEDQ  D:  05/03/2007  T:  05/04/2007  Job:  [id number]   cc:   Dr. Alwyn Ren

## 2011-02-21 NOTE — Consult Note (Signed)
NAME:  Karen Barron, Karen Barron               ACCOUNT NO.:  [id number]   MEDICAL RECORD NO.:  [id number]          PATIENT TYPE:  INP   LOCATION:  1304                         FACILITY:  [facility]   PHYSICIAN:  Casimiro Needle B. Sherene Sires, MD, FCCPDATE OF BIRTH:  1926-09-06   DATE OF CONSULTATION:  11/27/2007  DATE OF DISCHARGE:                                 CONSULTATION   REFERRING PHYSICIAN:  [name] B. Truett Perna, M.D.   REASON FOR CONSULTATION:  Unexplained dyspnea and chest pain.   HISTORY:  This is a chronically ill, 77 year old, white female who  states she has never smoked but has been chronically short of breath for  several years.  She underwent an evaluation for this 2 years ago by Dr.  Shelle Iron with the only positive being evidence of very minimum airflow  obstruction and anemia with a negative CT scan for pulmonary embolism  and a CPST showing that she did not reach any physiologic barriers to  continue to exercise and was felt therefore to have a suboptimal effort.  Her hematocrit then was 30%. In terms of the anemia, she was eventually  diagnosed with Waldenstrom's macroglobulinemia and comes in to the  hospital now acutely worse for the last 5 days with acute on chronic  dyspnea associated with increasing left greater than right anterior  chest pain which actually dates back to November when she fell being  pulled over by a dog in her driveway.  She denies any classic pleuritic  pain or cough, fevers, chills, sweats, orthopnea, PND but she has had  chronic mild leg swelling.   PAST MEDICAL HISTORY:  Significant for Waldenstrom's macroglobulinemia  as noted above, chronic asthma, diverticulosis, acid reflux disease,  hypothyroidism, mitral valve prolapse, hyperlipidemia, osteoporosis. She  is status post remote hysterectomy, bilateral knee replacement.   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   FAMILY HISTORY:  Positive for asthma in  her mother otherwise negative  for respiratory disease.   REVIEW OF SYSTEMS:  Taken in detail and negative except as outlined  above.   PHYSICAL EXAMINATION:  GENERAL:  This is a somewhat pale, anxious, white  female, no acute distress at rest.  GENERAL:  She had stable vital signs.  HEENT:  Unremarkable.  Pharynx clear. Dentition intact.  NECK:  Supple without cervical adenopathy or tenderness. The trachea is  midline with no thyromegaly.  LUNGS:  Lung fields reveal diminished breath sounds with no wheezing.  There is a regular rhythm without murmur, gallop or rub.  ABDOMEN:  Soft, benign but obese.  EXTREMITIES:  Warm without calf tenderness, cyanosis, or clubbing. There  is trace edema and marked serpiginous varicosities are present  bilaterally.   LAB DATA:  Echocardiogram is reviewed from February 18 and indicates no  evidence of pericardial effusion, minimal increase in PA systolic  pressure and normal left atrial size and ejection fraction as well as  wall motion.  Lab data included creatinine of 1.47 versus 0.97 obtained  July 2008 and a BUN of 6 then versus a BUN  of 36 now. Bicarb level of 31  now. Hematocrit on admission of 24.4% with an MCV of 92 and a BNP of  324. She is saturating well at rest but desaturates walking 50 feet.   IMPRESSION:  1. Multifactorial dyspnea that is actually a chronic issue and      probably related to obesity, anemia and deconditioning as suggested      on previous extensive workup including a CPST completed in March      2008. I would recommend transfusion to hematocrit of greater than      30% if possible and consider repeating PFTs before and after      bronchodilators this admission as she does have a history of      asthma, although the history does not suggest variability or      significant nocturnal exacerbation to invoke asthma at this point      and previous bronchodilators did not help her  2. Musculoskeletal chest pains with  tendency to atelectasis the bases      that is made worse by the discomfort related to previous chest wall      injury and the effects of obesity. Will teach her incentive      spirometry and see to what extent she improves.  3. Chronic renal insufficiency may be related to gammopathy.  I would      avoid over  diuresing in this setting treating her conservatively with close follow  up of renal function.  1. Since the chest discomfort followed a fall in November 2008, I      strongly doubt pulmonary embolism is likely especially since the      pain is in the exact same distribution it has been in for several      months without new chest pains developing and the recent crescendo      and chest pain related I believe to kyphoplasty that was done at      [facility] may or may not be attributable to manipulations      that were done for the purpose of performing kyphoplasty.      Karen Barron. Sherene Sires, MD, [facility]  Electronically Signed     MBW/MEDQ  D:  11/27/2007  T:  11/28/2007  Job:  [id number]   cc:   Leighton Roach. Truett Perna, M.D.  Fax: [phone]

## 2011-02-21 NOTE — Assessment & Plan Note (Signed)
HEALTHCARE                         GASTROENTEROLOGY OFFICE NOTE   NAME:Karen Barron, Karen Barron                      MRN:          [id number]  DATE:04/23/2007                            DOB:          04-27-1926    REFERRING PHYSICIAN:  Titus Dubin. Alwyn Ren, MD,FACP,FCCP   Mrs. Glazier is a 77 year old white female referred through the courtesy of  Dr. Alwyn Ren for evaluation of right upper quadrant pain, nausea, and  chronic diarrhea.   I have known Mrs. Beckett for many years, and she has a long history of  irritable bowel syndrome, diarrhea predominant.  She has had multiple  polyps in the past removed by Dr. Terrial Rhodes, and has known  diverticulosis.  Previous colon biopsies have not shown any evidence of  microscopic colitis.  She does have chronic GERD confirmed by endoscopy  and is on daily PPI therapy.  Because of recurrent abdominal pain, she  has had CT scans of the abdomen that have been unremarkable.  Some of  her problems are felt to be perhaps related to fatty infiltration of her  liver.  She is status post hysterectomy and appendectomy.  The patient  underwent CT scan of the chest in February 2007 and had CT scan of the  abdomen in August 2004.  CT scans in the past have confirmed recurrent  diverticulitis.   The patient has up to 6 watery bowel movements a day, with abdominal  cramping, gas, and bloating.  She denies lactose intolerance, use of  sorbitol or fructose, anorexia, or weight loss.  Her family is of [place]  descent.  She gives no history of hepatobiliary complaints, clay-colored  stools, dark urine, icterus, fever, or chills.  She had a negative ERCP  exam because of her multiple symptoms in 1992.  She also has noncardiac  chest pain and has had previously negative cardiac catheterization in  February 2007.  She has chronic lower extremity edema related to her  dyslipidemia, hypertension, and thyroid dysfunction.  Extensive workup  was  obtained with no evidence of recurrent pulmonary emboli.  The  patient in the past has had treatment in her record for multiple  myeloma.   MEDICATIONS:  1. Omeprazole 20 mg a day.  2. Synthroid 75 mcg a day.  3. Teveten 600 mg a day.  4. Lasix 40 mg twice a day.  5. Potassium chloride 20 mEq a day.  6. Zocor 20 mg a day.  7. Amitriptyline 100 mg at bedtime.  8. Lyrica 75 mg twice a day.  9. Metformin 250 mg daily.  10.Iron 50 mg twice a day.  11.Vitamin D 1000 units twice a day.  12.Os-Cal 2 a day.  13.Glucosamine 1500 mg a day.  14.Chondroitin 1200 mg a day.  15.Centrum Silver daily.   In the past, she has had reactions to ALEVE, INDOCIN, ACTONEL, and  GUAIFENESIN.   FAMILY HISTORY:  Noncontributory.   SOCIAL HISTORY:  She is married and lives with her husband.  She has a  high school education.  She is retired.  She does not smoke or use  ethanol.  REVIEW OF SYSTEMS:  Noncontributory, without current cardiovascular,  pulmonary, genitourinary, neurologic, orthopedic, or endocrine problems.  She recently saw Dr. Mills Koller for vascular evaluation for her  peripheral edema, and this apparently was unremarkable.   LABORATORY DATA:  Showed normal CBC and metabolic profile.   PHYSICAL EXAMINATION:  GENERAL:  She is a healthy-appearing white  female, appearing younger than her stated age.  She is in no acute  distress.  VITAL SIGNS:  She is 5 feet 3 inches tall and weighs 235 pounds.  Blood  pressure is 132/64, pulse was 76 and regular.  HEENT/NECK:  I could not appreciate stigmata of chronic liver disease or  thyromegaly.  CHEST:  Clear, without wheezes or rhonchi.  CARDIOVASCULAR:  She was in a regular rhythm, without murmurs, gallops,  or rubs.  ABDOMEN:  Showed obesity, but no definite organomegaly, masses, or  tenderness.  Bowel sounds were normal.  RECTAL:  Unremarkable.  Stool was guaiac negative.  EXTREMITIES:  There was peripheral edema.  She had on hose  stockings.  There was no evidence of phlebitis or swollen, tender joints.  NEUROLOGIC:  Her mental status was clear.   ASSESSMENT:  1. Chronic diarrhea, with abdominal gas and bloating.  Consider      bacterial overgrowth syndrome.  2. Chronic diverticulosis coli, with recurrent colon polyps.  3. Chronic gastroesophageal reflux disease, with chronic proton pump      inhibitor dependency.  4. Rule out fatty infiltration of the liver and cholelithiasis.  5. Chronic thyroid dysfunction, on thyroid replacement therapy.  6. History of hypertension, hyperlipidemia.  7. Vague history of multiple myeloma, which I cannot really confirm      per record review.  8. Multiple drug allergies, especially to nonsteroidal      antiinflammatory drugs.  9. Status post hysterectomy and appendectomy.  10.History of multiple orthopedic procedures for degenerative      arthritis.  11.Obesity and deconditioning, with dyspnea on exertion.   RECOMMENDATIONS:  1. Check carotene, celiac panel, amylase, lipase, B12, and folate      levels.  2. Upper abdominal ultrasound exam.  3. Follow up with endoscopy and colonoscopy.  4. Trial of Xifaxan 400 mg t.i.d. for 10 days, along with Align      probiotic therapy.  5. Continue other medications per Dr. Alwyn Ren.  6. At the time of her colonoscopy, also we will need to repeat her      colon biopsies for microscopic colitis.  This apparently was done      in the past, and they were unremarkable, although I cannot find      this report currently.     Vania Rea. Jarold Motto, MD, Caleen Essex, FAGA  Electronically Signed    DRP/MedQ  DD: 04/23/2007  DT: 04/24/2007  Job #: [id number]   cc:   Titus Dubin. Alwyn Ren, MD,FACP,FCCP

## 2011-02-21 NOTE — H&P (Signed)
NAME:  Karen Barron, Karen Barron               ACCOUNT NO.:  [id number]   MEDICAL RECORD NO.:  [id number]         PATIENT TYPE:  LINP   LOCATION:                               [facility]ACILITY[facility]  [facility]   PHYSICIAN:  [name] B. Truett Perna, M.D. DATE OF BIRTH:  [birth year]   DATE OF ADMISSION:  11/25/2007  DATE OF DISCHARGE:                              HISTORY & PHYSICAL   PATIENT IDENTIFICATION:  Karen Barron is a 77 year old with a history of  Waldenstrom macroglobulinemia.  She is now admitted for evaluation of  dyspnea/hypoxia in the setting of severe anemia.   HISTORY OF PRESENT ILLNESS:  Karen Barron has a history of Waldenstrom  macroglobulinemia dating to 2004.  She has an elevated serum IgM level  with a monoclonal IgM protein, anemia, and a bone marrow lymphocytosis.  The anemia is felt to be related to both Waldenstrom macroglobulinemia  and mild renal insufficiency.   The anemia has partially improved with erythropoietin therapy in the  past.  Karen Barron has been treated with Fludarabine and Rituximab for the  Waldenstrom macroglobulinemia.  The sero-monoclonal protein and anemia  did not respond to these treatments.  She was last treated with  Fludarabine in September of 2008.   Karen Barron has recently undergone a restaging evaluation for the  Waldenstrom macroglobulinemia.  A bone survey was negative.   She developed acute back pain in January of 2009.  A CT scan and MRI  scan of the spine confirmed an acute T7 compression fracture.   She saw Dr. Noel Gerold and underwent a T7 kyphoplasty on 02/10.  She reports  tolerating the procedure well.   She states the back pain has improved, but she has increased pain at the  anterolateral chest wall bilaterally.  She has noticed increased dyspnea  for the past few days.  She denies fever and cough.   She presented to the office for further evaluation.   PAST MEDICAL HISTORY:  1. Hypertension.  2. Hypothyroidism.  3. Hypercholesterolemia.  4. Osteoporosis.  5.  Chronic back pain.  6. Asthma.  7. History of diverticulitis.  8. Osteoarthritis .  9. Status post bilateral knee replacements.  10.Status post cataract surgery.  11.Status post back surgery in 2001.  12.Status post hysterectomy.  13.Status post tonsillectomy.  14.Status post left shoulder surgery.  15.History of mild renal insufficiency versus an elevated      BUN/creatinine due to polypharmacy.  16.Chronic anemia.   ALLERGIES:  1. GUAIFENESIN.  2. NAPROSYN.  3. INDOMETHACIN.  4. CLADRIBINE.   CURRENT MEDICATIONS:  1. Elavil 10 mg q h.s.  2. Flexeril 10 mg t.i.d. p.r.n.  3. Teveten 600 mg q day.  4. Nexium 20 mg q day.  5. Lasix 40 mg b.i.d.  6. Amaryl 0.5 mg q day.  7. Vicodin q 6 hours p.r.n.  8. Chromagen b.i.d.  9. Metanx 1 tablet q day.  10.Synthroid 75 mcg q day.  11.Imodium 2 mg q 12 hours.  12.Potassium chloride 20 mEq q day.  13.Lyrica 75 mg b.i.d.  14.Zocor 20 mg q h.s.   FAMILY HISTORY:  Noncontributory.  SOCIAL HISTORY:  She lives with her husband in [place].  She does  not use tobacco or alcohol.   REVIEW OF SYSTEMS:  CONSTITUTIONAL - She denies fever.  RESPIRATORY -  She reports increased exertional dyspnea over the past week.  CARDIAC -  Negative.  GU - Negative.  GI - Negative.   PHYSICAL EXAMINATION:  VITAL SIGNS:  Blood pressure of 141/61, pulse  107, temperature 97.2, oxygen saturation 95% at rest, 87% after  ambulation.  LUNGS:  Decreased breath sounds at the bases bilaterally with end  inspiratory rhonchi bilaterally.  CARDIAC:  Regular rhythm.  ABDOMEN:  No hepatosplenomegaly.  EXTREMITIES:  There is pitting edema at the low leg bilaterally.  MUSCULOSKELETAL:  There is tenderness at the anterolateral chest wall,  left greater than right.  No rash.   LABORATORY DATA:  Hemoglobin 7.1, platelets 136,000.  White count of  5.6, ANC 3.0, BUN 34, creatinine 1.33, total protein 9.9, albumin 3.1,  calcium 9.8.   IMPRESSION:  1.  Waldenstrom macroglobulinemia/diagnosis is based on a sero-      monoclonal protein, a bone marrow lymphocytosis, and anemia.  We      will follow up  on the bone marrow aspirate and biopsy obtained at      the time of the T7 kyphoplasty procedure last week and then decide      on further systemic therapy.  2. Chronic anemia - Progressive.  Likely secondary to Waldenstrom      macroglobulinemia.  3. Dyspnea/hypoxia - the dyspnea is likely in part due to anemia.  We      will evaluate the hypoxia further during this admission.  4. Renal insufficiency.  5. Status post left shoulder surgery.  6. Status post a T7 kyphoplasty on 02/10.  7. Mild thrombocytopenia - Likely related to Waldenstrom      macroglobulinemia.   PLAN:  1. Admit for acute red cell transfusion support.  2. Chest x-ray.  3. Check BNP and consider an echocardiogram.  4. Follow up on the bone marrow biopsy from 02/10.   Karen Barron is symptomatic with exertional dyspnea.  The dyspnea is partly  related to anemia.  She was also hypoxic after exertion in the office  today.  She will be admitted for transfusion support and further  diagnostic evaluation.      Leighton Roach Truett Perna, M.D.  Electronically Signed     GBS/MEDQ  D:  11/25/2007  T:  11/25/2007  Job:  [id number]   cc:   Sharolyn Douglas, M.D.  Fax: [phone]   Titus Dubin. Alwyn Ren, MD,FACP,FCCP  [phone] [place]  [place] [address]   Barbaraann Share, MD,FCCP  [address]  [place]  [place] [address]   Almedia Balls. Ranell Patrick, M.D.  Fax: [phone]   Jaclyn Prime. Greggory Stallion, M.D.  Fax: [phone]

## 2011-02-21 NOTE — Assessment & Plan Note (Signed)
[facility]                         GASTROENTEROLOGY OFFICE NOTE   NAME:Karen Barron, Karen Barron                      MRN:          [id number]  DATE:08/06/2007                            DOB:          [birth year]    [place] continues with 4-5 loose bowel movements a day with abdominal  gas and bloating.  Her irritable bowel syndrome serology from [facility] in [place] was negative.  In talking with [place], she does  have response to Imodium if used p.r.n.  She is seeing Dr. Truett Perna at  the oncology center for [facility] and is  apparently on monthly chemotherapy.  Recent lab test she showed me today  shows a white count of 3,700, hemoglobin of 8.0, and a platelet count of  146,000.   She is a healthy-appearing but pale white female in no acute distress.  The weight is 227 pounds, the blood pressure is 140/60.  Pulse was 76  and regular.  I could not appreciate hepatosplenomegaly, abdominal masses, or  tenderness.  Bowel sounds were normal.  RECTAL EXAM:  Deferred.   ASSESSMENT:  I still think that Karen Barron has rapid intestinal transit  irritable bowel syndrome.  However, I am reluctant to put her on  Lotronex with such a low hemoglobin and otherwise associated medical  problems, including her hypertension and hyperlipidemia and hematologic  problems.   RECOMMENDATIONS:  I have advised her to use Imodium 2 mg every 12 hours  as needed for her rapid transit diarrhea.  She has had a thorough  negative gastrointestinal workup, otherwise, including recent  colonoscopy with multiple biopsies to exclude microscopic-collagenous  colitis.  She has also had small bowel biopsy which was normal, and  mentioned above negative serologies or any pattern consistent with  inflammatory bowel disease or irritable bowel syndrome.  However, this  test is only 88% specific and 50% sensitive.   ADDENDUM:  I will ask Dr. Truett Perna to send his  pertinent records concerning her  hematologic/oncologic treatment.     Vania Rea. Jarold Motto, MD, Caleen Essex, FAGA  Electronically Signed    DRP/MedQ  DD: 08/06/2007  DT: 08/06/2007  Job #: [id number]   cc:   Leighton Roach. Truett Perna, M.D.  Titus Dubin. Alwyn Ren, MD,FACP,FCCP

## 2011-02-21 NOTE — Discharge Summary (Signed)
NAME:  Karen Barron, Karen Barron               ACCOUNT NO.:  [id number]   MEDICAL RECORD NO.:  [id number]          PATIENT TYPE:  INP   LOCATION:  1314                         FACILITY:  [facility]   PHYSICIAN:  Leighton Roach. Truett Perna, M.D. DATE OF BIRTH:  08-06-1926   DATE OF ADMISSION:  12/09/2007  DATE OF DISCHARGE:  12/13/2007                               DISCHARGE SUMMARY   DISCHARGE DIAGNOSES:  1. Multiple myeloma, IgM monoclonal protein, to start in 1 week with      REV - dexamethasone.  2. Nausea, better controlled with medications.  3. Abdominal pain, improved.  4. Diarrhea, improved today.  5. Likely viral gastroenteritis.  6. Anemia secondary to one.  7. Thrombocytopenia secondary to number one.  8. Chronic renal insufficiency.  9. Status post T7 kyphoplasty on November 19, 2007.  10.Diabetes mellitus.  11.Recent left rib fracture.  12.Left iliac pain, controlled with medications.  13.History of muscle spasms.  14.Hypothyroidism.  15.Osteoporosis.  16.Asthma.  17.History of diverticulitis.  18.Status post bilateral knee replacement.  19.Status post cataract surgery.  20.Status post back surgery 2001.  21.Status post hysterectomy.  22.Status post tonsillectomy.  23.Status post left shoulder surgery.   ALLERGIES:  GUAIFENESIN, NAPROSYN, INDOMETHACIN, CLADRIBINE.   DISCHARGE MEDICATIONS:  1. Elavil 10 mg p.o. q.h.s.  2. Flexeril 10 mg p.o. t.i.d. p.r.n.  3. Nexium 20 mg p.o. q.d.  4. Amaryl 0.5 mg p.o. q.d.  5. Vicodin q.6 hours p.r.n.  6. Synthroid 75 mcg p.o. q.d.  7. Imodium 2 tablets q.12 hour p.o. p.r.n.  8. Zocor 20 mg p.o. q.h.s.  9. Compazine 5 mg p.o. q.6 h. p.r.n.   MEDICATIONS:  On hold:  1. Lasix 40 mg b.i.d.  2. Kay Ciel 20 mEq q.d.  3. Metanx one tablet q.d.  4. Chromagen b.i.d.   LABS:  On the chart:  C. diff toxin negative x2, quantitative immunoglobulins:  IgA 14, IgG  302, IgN 7880, SPEP showing monoclonal IgM kappa protein present.  The  restricted bands  consistent with monoclonal protein is present, the  monoclonal protein peak accounts for 3.94 g/dL of the total 1.61 g/dL of  protein in the gamma region.  Heme-occult negative, amylase 26, sodium  138, potassium 4.4, BUN 31, creatinine 1.18, glucose 86, total bilirubin  0.8, alkaline phosphatase 42, AST 20, ALT 21, total protein 10.2,  albumin 2.7, calcium 8.6, white count 4.3, hemoglobin 9.4, hematocrit  26.7, platelets 113, lipase 44.   RADIOLOGICAL STUDIES:  CT of the pelvis and abdomen without contrast  December 09, 2007 showing tiny non-obstructing right renal calculus.  No  acute upper abdominal abnormalities.  Sigmoid diverticulosis.  No acute  intrapelvic abnormalities.  Abdominal  x-ray on December 09, 2007 showing no  acute abdominal abnormalities.   HISTORY OF PRESENT ILLNESS:  Karen Barron is a 78 year old white female,  with a history of IgM kappa monoclonal protein.  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  She  underwent kyphoplasty procedure with Dr. Noel Gerold on November 19, 2007.  She underwent bone marrow biopsy during the week of  February 23, with  final pathology consistent with hypercellular bone marrow with  plasmacytosis.  Karen Barron took a single dose of Decadron 48 mg on  February 28, as the start of the treatment for multiple myeloma.  She  reported on February 27 acute onset of nausea and abdominal pain, unable  to eat or drink due to the abdominal pain.  She also reported loose  stools.  Denied emesis, headaches or visual changes.  She also  complained of pain at the left posterior iliac area.  Based on these  symptoms, the patient was recommended to be admitted, for further  evaluation and management of her symptoms.   HOSPITAL COURSE:  The patient was admitted for IV hydration, antiemetic  support, and narcotic analgesics.  A CT of the abdomen was performed,  while in the emergency room, showing no acute changes.  No  abnormalities  were seen.  Over the following days, the patient continued to ameliorate  these symptoms, although she had diarrhea, for which cultures were  obtained.  This returned negative for C. difficile.  Over the following  days, the patient's symptoms began to improve.  Her nausea improved, and  her pain was better controlled.  Her only big problem was loose stools,  which needed to be better controlled.  As of today, December 13, 2007, her  vital signs are stable, she is afebrile, and she has intermittent  nausea, which is being managed with antiemetics.  There is no diarrhea  today.  After evaluation by Dr. Elnita Maxwell, it was concluded that the  patient will be discharged home, if she is taking p.o.'s without nausea,  and she is ambulatory.  Of note, other problems managed while in the  hospital where her anemia and thrombocytopenia, which are likely  secondary to her multiple myeloma.  There were no worsening of her lab  counts.  All of her other chronic medical problems were attended as  well.  The patient's condition is overall improved, she knows to call if she  has any questions or concerns.  She is to start in the next week with  chemotherapy, with REV and dexamethasone.  An appointment is already  arranged for her with Dr. Elnita Maxwell.      Marlowe Kays, P.A.      Leighton Roach. Truett Perna, M.D.  Electronically Signed    SW/MEDQ  D:  12/13/2007  T:  12/13/2007  Job:  [id number]

## 2011-02-21 NOTE — Discharge Summary (Signed)
NAME:  Karen Barron, Karen Barron               ACCOUNT NO.:  [id number]   MEDICAL RECORD NO.:  [id number]          PATIENT TYPE:  INP   LOCATION:  1323                         FACILITY:  [facility]   PHYSICIAN:  Hedwig Morton. Juanda Chance, MD     DATE OF BIRTH:  1926-05-26   DATE OF ADMISSION:  05/02/2007  DATE OF DISCHARGE:  05/07/2007                               DISCHARGE SUMMARY   ADMITTING DIAGNOSES:  1. [age]-year-old white female, status post      esophagogastroduodenoscopy and colonoscopy, May 01, 2007, per Dr.      Jarold Motto, presenting 24 hours post colonoscopy with acute      abdominal pain, fever and multiple episodes of diarrhea, rule out      infectious colitis, ischemic colitis, diverticulitis, though doubt,      or acute viral gastroenteritis.  Colonoscopy was done secondary to      complaints of diarrhea and abdominal discomfort, though neither to      this degree.  2. History of probable multiple myeloma with chronic anemia and mild      thrombocytopenia.  3. Adult-onset diabetes mellitus, on oral agent.  4. Hypertension.  5. Hyperlipidemia.  6. Status post appendectomy, hysterectomy and multiple orthopedic      procedures.   DISCHARGE DIAGNOSES:  1. [age]-year-old white female with acute abdominal pain and      diarrhea with fever 24 hours post colonoscopy, CT with distal      transverse colon colitis, ischemia versus infectious.  2. Anemia, chronic, with a drop in hemoglobin, requiring transfusion      x2.  3. Hypokalemia, resolved.  4. Other diagnoses as listed above.  5. History of Waldenstrom's macroglobulinemia with chronic anemia.  6. Other diagnoses as listed above.   CONSULTATIONS:  None.   PROCEDURES:  CT of the abdomen and pelvis.   BRIEF HISTORY:  [place] is a pleasant 77 year old white female, primary  patient of Dr. Alwyn Ren, known to Dr. Sheryn Bison, with history as  described above.  She does have a chronic anemia, followed by Dr.  Truett Perna.  The  patient relayed that she had multiple myeloma, but per  Dr. Kalman Drape record, she has a Waldenstrom's macroglobulinemia.  She  had colonoscopy and EGD done the day prior to admission for complaints  of abdominal pain and diarrhea with 4-5 bowel movements per day.  The  EGD was negative.  Small bowel biopsies were taken and colonoscopy was  negative with the exception of left colon diverticula and spasm; random  biopsies were done.  She felt okay post procedure and then about 5 p.m.  on the 23rd, she woke up after taking a nap with rather diffuse  abdominal discomfort, chills, fever and then multiple episodes of  diarrhea with 10-12 bowel movements prior to coming to the emergency  room.  She had nausea, but no vomiting, no evidence of melena or  hematochezia, was seen and evaluated in the ER and admitted with an  infectious versus ischemic colitis.   Laboratory studies on July 24:  WBC of 9.6, hemoglobin 9, hematocrit of  25.6, MCV of  90.8, platelets 128,000.  Serial values were obtained on  the 26th; hemoglobin was 7.6, hematocrit of 21.3; on the 28th,  hemoglobin 7.4, hematocrit of 20.8 and post transfusion hemoglobin 9,  hematocrit of 25.3.  Electrolytes on admission within normal limits, BUN  13, creatinine 1.05; potassium did drop the 3.1 and was corrected;  albumin 3.1 on admission.  Liver function studies normal.  She had 0-2  wbc's per high power field.  Blood culture negative for 5 days.  Urine  culture negative.  Clostridium difficile negative.  Stool culture  negative.  Stool for lactoferrin was positive.   X-RAY STUDIES:  CT of the abdomen and pelvis on July 24 showed a distal  transverse colonic wall thickening, question infection, inflammatory  versus ischemia, and sigmoid diverticulosis.   Plain abdominal films on the 24th showed chronic lung changes and  bibasilar atelectasis, nonspecific gas pattern.   HOSPITAL COURSE:  The patient was admitted to the service of Dr.  Arlyce Dice,  who was covering the hospital.  She was placed on a clear liquid diet.  Stool cultures were obtained.  She was hydrated, covered empirically  with IV Cipro and IV Flagyl.  She did have a fever to 102.7 in the ER  and blood cultures were obtained as well.  She underwent CT of the  abdomen and pelvis with findings as outlined above.  This appeared to be  more consistent with an ischemic colitis, though she never passed any  blood and was also having fever, so the possibility of an infectious  etiology was also considered.  Her symptoms gradually settled down.  She  defervesced and we were able to gradually advance her diet.  Her colon  biopsies done randomly were negative, as were the small bowel biopsies,  and all of her cultures returned negative.  By July 27, she was feeling  better overall, having much less diarrhea, but had continued weakness  and debilitation and a low-grade temperature.  She does have chronic  anemia, but had had a drop in her hemoglobin to the 7.4 range and after  discussion with Dr. Arbutus Ped, et, al., it was decided to transfuse her 2  units prior to discharge.  By July 29, she was feeling better,  tolerating p.o.'s and was allowed discharge to home with instructions to  follow up with Dr. Jarold Motto on August 14, to call for any problems in  the interim and follow up with Dr. Truett Perna on August 9.   DISCHARGE MEDICATIONS:  1. Continue Nexium 40 mg p.o. daily.  2. Synthroid 75 daily.  3. Zocor 20 mg daily.  4. Teveten 600 daily.  5. Lasix 40 b.i.d.  6. Amitriptyline 10 daily.  7. Lyrica 75 twice daily.  8. Metformin 500 once daily.  9. Vitamins as previous.  10.She was to use Imodium one or two daily as needed.  11.Complete a course of Flagyl 500 mg p.o. b.i.d. for 5 more days and      Cipro 500 b.i.d. for 5 more days and K-Dur 20 mEq one twice daily      for 5 days.   CONDITION ON DISCHARGE:  Stable and improved.     Amy Sterling,  PA-C      Dora M. Juanda Chance, MD  Electronically Signed   AE/MEDQ  D:  05/22/2007  T:  05/23/2007  Job:  [id number]

## 2011-02-21 NOTE — H&P (Signed)
NAME:  Karen Barron, Karen Barron               ACCOUNT NO.:  [id number]   MEDICAL RECORD NO.:  [id number]          PATIENT TYPE:  INP   LOCATION:  NA                           FACILITY:  [facility]   PHYSICIAN:  [name] B. Truett Perna, M.D. DATE OF BIRTH:  December 30, 1925   DATE OF ADMISSION:  12/09/2007  DATE OF DISCHARGE:                              HISTORY & PHYSICAL   PATIENT IDENTIFICATION:  Ms. Purewal is a 77 year old with multiple  myeloma.  She is now admitted with abdominal pain, back pain, nausea,  and clinical evidence of dehydration.   HISTORY OF PRESENT ILLNESS:  Ms. Beebe has a history of an IgM kappa  monoclonal protein.  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  She underwent a kyphoplasty procedure  by Dr. Noel Gerold on November 19, 2007.   She was admitted with severe anemia on November 25, 2007.  Her clinical  status improved markedly after 4 units of packed red blood cells.   She underwent a diagnostic bone marrow biopsy during the week of  February 23.  The final pathology is pending, but a preliminary review  of the histology and a CD-138 stain is consistent with a bone marrow  plasmacytosis.   Ms. Steinhilber took a single dose of Decadron (40 mg) on February 28 as the  start of treatment for multiple myeloma.   She reports the acute onset of nausea and abdominal pain beginning on  February 27.  She has been unable to eat or drink due to the abdominal  pain.  She also reports recent loose stools.  She denies emesis,  headache, and visual changes.  She also complains of pain at the left  posterior iliac area.  The mid back pain has improved.   PAST MEDICAL HISTORY:  1. IgM monoclonal protein, most likely indicative of a rare case of      IgM multiple myeloma.  2. Asthma.  3. History of diverticulitis.  4. Hypertension.  5. Hypothyroidism.  6. Osteoporosis.  7. Status post bilateral knee replacement.  8. Status post cataract surgery.  9. Status post  back surgery in 2001.  10.Status post hysterectomy.  11.Status post tonsillectomy.  12.Status post left shoulder surgery.  13.History of mild renal insufficiency versus an elevated BUN and      creatinine due to polypharmacy.  14.Chronic anemia.   ALLERGIES:  1. GUAIFENESIN.  2. NAPROSYN.  3. INDOMETHACIN.  4. CLADRIBINE.   CURRENT MEDICATIONS:  1. Elavil 10 mg nightly.  2. Flexeril 10 mg 3 times a day p.r.n.  3. Nexium 20 mg daily.  4. Lasix 4 mg b.i.d.  5. Amaryl 0.5 mg daily.  6. Vicodin q.6 h p.r.n.  7. Chromagen b.i.d.  8. Medinex 1 tablet daily.  9. Synthroid 75 mcg daily.  10.Imodium 2 tablets q.12 h p.r.n.  11.Potassium chloride 20 mEq daily.  12.Zocor 20 mg nightly.   FAMILY HISTORY:  Noncontributory.   SOCIAL HISTORY:  She lives with her husband in [place].  She does  not use tobacco or alcohol.   REVIEW OF SYSTEMS:  CONSTITUTIONAL:  She denies fever.  RESPIRATORY:  She has a history of chronic exertional dyspnea, improved over the past  1-2 weeks following a red cell transfusion.  CARDIAC:  Negative.  GU:  Negative.  GI:  Abdominal pain and nausea as per HPI.  No emesis.  Neurologic:  Negative.   PHYSICAL EXAMINATION:  VITAL SIGNS:  Blood pressure 139/59, pulse 95,  temperature 97.7, oxygen saturation 98% on room air.  HEENT:  The mucous membranes were dry.  LUNGS:  Clear.  CARDIAC:  Regular rhythm.  ABDOMEN:  Nontender.  Bowel sounds are present.  No hepatosplenomegaly.  No mass.  EXTREMITIES:  Chronic stasis change at the low leg bilaterally with  venous varicosities.   LABORATORY DATA:  Hemoglobin 11, platelets 115,000, white count 5.4, ANC  2.7. BUN 39, creatinine 1.24, calcium 9.6, albumin 3.2, total protein  11.9, lipase 44.   IMPRESSION:  1. IgM multiple myeloma:  The plan is to initiate therapy with red      Revlimid and Decadron within the next 1 week.  2. History of anemia, status post recent red cell transfusions.  The      anemia is  secondary to multiple myeloma and potentially mild      chronic renal insufficiency.  3. Chronic renal insufficiency.  4. Status post a T7 kyphoplasty on November 19, 2007, with the      pathology confirming a plasmacytoma.  5. Thrombocytopenia secondary to multiple myeloma.  6. Back pain, likely secondary to multiple myeloma involving the bones      and compression fractures.  7. Recent left rib fracture.  8. Nausea and abdominal pain of questionable etiology.   Ms. Ventrella presents with the acute onset of abdominal pain and nausea  beginning February 27.  The differential diagnosis includes peptic ulcer  disease, diverticulitis, and an abdominal process related to the  underlying myeloma.   I doubt the pain is related to radicular pain from the T7 kyphoplasty  or other myelomatous lesions involving the spine.   She will be admitted for intravenous hydration, antiemetic support, and  narcotic analgesics.  She will be scheduled for a CT of the abdomen and  pelvis while in the emergency room.  We will follow up on the final bone  marrow biopsy report with the plan to initiate Revlimid and Decadron  therapy if a diagnosis of IgM multiple myeloma is confirmed.      Leighton Roach Truett Perna, M.D.  Electronically Signed     GBS/MEDQ  D:  12/09/2007  T:  12/09/2007  Job:  [id number]   cc:   Sharolyn Douglas, M.D.  Fax: [phone]   Titus Dubin. Alwyn Ren, MD,FACP,FCCP  [phone] [place]  [place] [address]   Hoyle Barr, MD, Oncology,  [facility]

## 2011-02-21 NOTE — Discharge Summary (Signed)
NAME:  Karen Barron, Karen Barron               ACCOUNT NO.:  [id number]   MEDICAL RECORD NO.:  [id number]          PATIENT TYPE:  INP   LOCATION:  1304                         FACILITY:  [facility]   PHYSICIAN:  Leighton Roach. Truett Perna, M.D. DATE OF BIRTH:  1925/12/20   DATE OF ADMISSION:  11/25/2007  DATE OF DISCHARGE:  11/28/2007                               DISCHARGE SUMMARY   DISCHARGE DIAGNOSES:  1. IgM multiple myeloma.  2. Anemia likely secondary to multiple myeloma and chronic renal      failure.  3. Chronic renal failure.  4. Dyspnea/hypoxemia, improved at discharge.  5. Left rib fracture.  6. Status post T7 kyphoplasty.  7. Mild thrombocytopenia.   CONSULTATIONS:  Dr. Sandrea Hughs, pulmonary.   PROCEDURES/STUDIES:  1. Blood transfusion November 25, 2007.  2. V/Q scan November 27, 2007, with low probability for pulmonary      embolism.  3. Left rib films:  Healing fracture of the left ninth rib.  4. Chest x-ray November 27, 2007:  Lingular and basilar subsegmental      atelectasis and/or scar; mild compression of a lower thoracic or      upper lumbar vertebral body.   HISTORY OF PRESENT ILLNESS:  Karen Barron is a 77 year old woman with a  history of Waldenstrom's macroglobulinemia dating to 2004.  She has an  elevated serum IgM level with a monoclonal IgM protein, anemia, and a  bone marrow leukocytosis.  The anemia is felt to be related to both  Waldenstrom's macroglobulinemia and mild renal insufficiency.  The  anemia has partially improved with erythropoietin therapy in the past.  Karen Barron has been treated with fludarabine and rituximab for the  Waldenstrom's.  The serum monoclonal protein and anemia did not respond  to the treatment.  She was last treated with fludarabine September 2008.   Karen Barron developed acute back pain in January 2009.  A CT scan and MRI  scan of the spine confirmed an acute T7 compression fracture.  She  underwent a T7 kyphoplasty on November 19, 2007, by Dr.  Noel Gerold with  improvement in her back pain.  She has, however, noted increased pain of  the anterolateral chest wall bilaterally as well as increased dyspnea  for the past several days.  She denied any fever or cough.  She  presented to the office on November 25, 2007. for further evaluation of  the above complaints.  Laboratory work showed hemoglobin 7.1; white  count 5.6; ANC 3.0; platelet count 136,000, BUN 34, creatinine 1.33,  total protein 9.9, albumin 3.1 and calcium 9.8.  She was noted to be  hypoxic after exertion in the office.  She was subsequently admitted for  transfusion support and further diagnostic evaluation.   HOSPITAL COURSE:  Karen Barron was admitted to the oncology unit at [facility] on November 25, 2007, for transfusion support and further  evaluation of dyspnea/hypoxia.  Hemoglobin on admission was 7.1.  She  was transfused 2 units of packed red blood cells with hemoglobin  November 26, 2007, 8.6.  She was transfused an additional 2 units of  blood on November 27, 2007, when the hemoglobin again returned at 8.6.  The anemia is felt to likely be secondary to multiple myeloma and  chronic renal failure.   [name] complained of dyspnea and was noted to be hypoxic on  admission.  The etiology was unclear.  BNP was elevated at 324.  Admission chest x-ray showed mild atelectasis at the lung bases,  slightly improved since September 16, 2007.  A V/Q scan showed a low  likelihood ratio for pulmonary embolism.  Pulmonary consult was  obtained.  Karen Barron was evaluated by Dr. Sherene Sires, who felt that the  multifactorial dyspnea was actually a chronic issue and probably related  to obesity, anemia and deconditioning.  Dr. Sherene Sires recommended transfusion  to a hematocrit of greater than 30% if possible, as well as to consider  repeating PFTs.  The dyspnea and hypoxia both improved during the  hospitalization.  Of note, a 2-D echocardiogram on November 27, 2007,  showed  overall normal left ventricular systolic function with left  ventricular ejection fraction estimated at 55-65%.   Pathology from the T7 kyphoplasty procedure done November 19, 2007,  became available during hospitalization and was consistent with plasma  cell infiltrate.  Dr. Truett Perna felt that Karen Barron likely had IgM  multiple myeloma.  He reviewed the diagnosis with Karen Barron and her  husband.  The plan is to start Revlimid/Decadron versus MPT as an  outpatient.   Rib x-ray done November 26, 2007, showed a healing fracture of the left  ninth rib.  This was not evident on prior rib films dated September 16, 2007.   On November 28, 2007, Karen Barron was felt to be stable for discharge  home.  The dyspnea and hypoxia were both improved.  She will begin  treatment for multiple myeloma over the next week.   DISPOSITION AT DISCHARGE:  1. Condition - improved.  2. Activity - increase activity slowly.  Walk with assistance.  3. Diet - no restrictions.  4. Special instructions - call with increased pain or shortness of      breath.  5. Wound care - not applicable.  6. Follow-up Dr. Truett Perna - office will call with appointment time.   DISCHARGE MEDICATIONS:  1. Elavil 10 mg at bedtime.  2. Nexium 20 mg daily.  3. Lasix 40 mg twice daily.  4. Amaryl 0.5 mg daily.  5. Synthroid 75 mcg daily.  6. Potassium 20 mEq daily.  7. Zocor 20 mg daily.  8. Flexeril 10 mg three times daily as needed.  9. Chromagen twice daily.  10.Vicodin one tablet every 6 hours as needed.      Lonna Cobb, N.P.      Leighton Roach. Truett Perna, M.D.  Electronically Signed    LT/MEDQ  D:  01/17/2008  T:  01/17/2008  Job:  [id number]

## 2011-02-23 LAB — PROTEIN ELECTROPHORESIS, SERUM
Albumin ELP: 59.4 % (ref 55.8–66.1)
Alpha-1-Globulin: 5.2 % — ABNORMAL HIGH (ref 2.9–4.9)
Beta 2: 3.9 % (ref 3.2–6.5)
Total Protein, Serum Electrophoresis: 6.6 g/dL (ref 6.0–8.3)

## 2011-02-23 LAB — COMPREHENSIVE METABOLIC PANEL
Albumin: 4.2 g/dL (ref 3.5–5.2)
Alkaline Phosphatase: 44 U/L (ref 39–117)
BUN: 13 mg/dL (ref 6–23)
CO2: 26 mEq/L (ref 19–32)
Glucose, Bld: 122 mg/dL — ABNORMAL HIGH (ref 70–99)
Potassium: 3.9 mEq/L (ref 3.5–5.3)
Total Bilirubin: 0.7 mg/dL (ref 0.3–1.2)

## 2011-02-23 LAB — IGM: IgM, Serum: 731 mg/dL — ABNORMAL HIGH (ref 60–263)

## 2011-02-23 LAB — URIC ACID: Uric Acid, Serum: 5.3 mg/dL (ref 2.4–7.0)

## 2011-02-24 NOTE — H&P (Signed)
NAME:  Karen Barron, Karen Barron NO.:  [id number]   MEDICAL RECORD NO.:  [id number]                    PATIENT TYPE:   LOCATION:                                       FACILITY:   PHYSICIAN:  Sharolyn Douglas, M.D.                     DATE OF BIRTH:  12-23-1925   DATE OF ADMISSION:  DATE OF DISCHARGE:                                HISTORY & PHYSICAL   CHIEF COMPLAINT:  Pain in my low back, severe.   HISTORY OF PRESENT ILLNESS:  This 77 year old lady has been seen by Dr.  Noel Gerold for progressive problems concerning her low back with radiation into  the buttocks area.  She has had progressively worsening back pain dating  back to 50.  She had a lumbar laminectomy in the past at L5-S1.  Unfortunately, it did not improve her symptomatology.  She has been a very  active independent lady but now she is having more and more difficulties  getting about, doing her household activities.  She also has found herself  to have inability to drive secondary to her pain.  Her pain primarily is  ulcerations of both lower extremities with a burning type discomfort.  After  much discussions and findings on her MRI, which shows severe lateral recess  stenosis with foraminal narrowing.  It was felt that she would benefit with  surgical intervention.  Dr. Noel Gerold has discussed with her and her family the perioperative course as  well as complications as well as benefits of surgery.  She is anxious to  have this surgery, has a very strong family support.  She and her daughter  have told me that Dr. Alwyn Ren has provided a preoperative clearance and we  decided to go ahead with surgical intervention.   ALLERGIES:  1. INDOCIN.  2. GUAIFENESIN.   CURRENT MEDICATIONS:  1. Nexium 40 mg one daily.  2. Calcium 600 mg two a day.  3. Levbid 0.375 mg two a day.  4. __________ 600 mg one daily.  5. Actonel 35 mg once a week.  6. Synthroid 25 mcg one and a half daily.  7. Amitriptyline 10 mg at  bedtime.  8. Zocor 10 mg at bedtime.   PAST MEDICAL HISTORY:  This lady is under the care of Titus Dubin. Alwyn Ren,  M.D., for her general medical condition.  Her gastroenterologist is Vania Rea.  Jarold Motto, M.D., and her oncologist is Leighton Roach. Truett Perna, M.D.  She  supposedly has, according to her report, multiple myeloma.  She also has  migraine headaches, asthma from time to time.  She has reflux and  diverticulitis.  She also has hypothyroidism and has hypertension.   PAST SURGICAL HISTORY:  1. Appendectomy in 1951.  2. Tonsillectomy in 1951.  3. C. section in 1962.  4. Hysterectomy in 1970.  5. Laparotomy in 2001.  6. Two knee replacements in  1995 and 1997.  7. Left shoulder surgery in 2002.  8. Low back surgery L5-S1 in 2000.  9. Bilateral cataract removal.  10.      Heart catheterization was done in 2002.   FAMILY HISTORY:  Positive for melanoma cancer in her sister and leukemia in  her father.  Her mother died of a stroke in 64.   SOCIAL HISTORY:  The patient is married, retired and has no intake of  tobacco or alcohol products.  Care after surgery will be her husband and her  daughter.   REVIEW OF SYMPTOMS:  CNS:  No seizure disorder, paralysis or double vision.  RESPIRATORY:  No productive cough, no hemoptysis, no shortness of breath  other than extreme exertion.  CARDIOVASCULAR:  No chest pain, no angina, no  orthopnea.  GASTROINTESTINAL:  No nausea or vomiting, melena or bloody  stools.  GENITOURINARY:  No discharge, dysuria or hematuria.  MUSCULOSKELETAL:  Primarily in present illness.   PHYSICAL EXAMINATION:  GENERAL APPEARANCE:  Alert and cooperative and  friendly 77 year old female.  VITAL SIGNS:  Blood pressure 130/60, pulse 72, respiratory rate 12.  HEENT:  Normocephalic.  PERRLA.  Oropharynx is clear.  CHEST:  Clear to auscultation with no rhonchi or rales.  CARDIOVASCULAR:  Regular rate and rhythm, grade 3/6 murmur heard best at the  right sternal border.   ABDOMEN:  Soft and nontender, liver and spleen not felt.  GENITALIA, RECTAL, PELVIC, BREASTS:  Not done.  EXTREMITIES:  No deformities.  Negative straight leg raising bilaterally.  Motor grossly intact.   ADMISSION DIAGNOSES:  1. Spondylolisthesis with postoperative laminectomy symptoms.  2. Hypothyroidism.  3. Gastroesophageal reflux disease.  4. Hypertension.  5. Multiple myeloma (9%).   PLAN:  The patient will undergo L5-S1 revision laminectomy and posterior  spinal fusion with medical screws, possible transforaminal lumber interbody  fusion, PICBF and allograft and possible L4-L5 posterior spinal fusion.      Dooley L. Cherlynn June.                 Sharolyn Douglas, M.D.    DLU/MEDQ  D:  06/03/2004  T:  06/03/2004  Job:  [id number]   cc:   Titus Dubin. Alwyn Ren, M.D. [facility]

## 2011-02-24 NOTE — Procedures (Signed)
NAME:  Karen Barron, Karen Barron               ACCOUNT NO.:  [id number]   MEDICAL RECORD NO.:  [id number]          PATIENT TYPE:  OUT   LOCATION:  SLEEP CENTER                 FACILITY[facility]  [facility]   PHYSICIAN:  Marcelyn Bruins, M.D. [facility] DATE OF BIRTH:  03/31/1926   DATE OF STUDY:  07/18/2005                              NOCTURNAL POLYSOMNOGRAM   REFERRING PHYSICIAN:  Dr. Titus Dubin. Alwyn Ren, M.D.   DATE OF STUDY:  July 18, 2005.   INDICATION FOR STUDY:  Hypersomnia with sleep apnea.   EPWORTH SCORE:  Six.   SLEEP ARCHITECTURE:  The patient had a total sleep time of 358 minutes with  large amounts of slow wave sleep for her age and decreased REM. Sleep onset  latency was normal as was REM onset. Sleep efficiency was 87%.   RESPIRATORY DATA:  The patient was found to have 35 hypopneas and 30 apneas  for a respiratory disturbance index of 11 events per hour. There was loud to  very loud snoring noted. The patient slept in the supine position throughout  the whole night.   OXYGEN DATA:  There were O2 desaturations as low as 81% associated with the  obstructive events.   CARDIAC DATA:  No clinically significant cardiac arrhythmias.   MOVEMENT/PARASOMNIA:  The patient was found to have 24 leg jerks with only  two per hour resulting in arousal or awakening. No other abnormal behavior  was noted throughout the recording time.   IMPRESSION/RECOMMENDATIONS:  Mild obstructive sleep apnea/hypopnea syndrome  with a respiratory disturbance index of 11 events per hour and O2  desaturation as low as 81%. Treatment of this degree of sleep apnea may  include weight loss alone if appropriate, upper airway surgery, oral  appliance, and C-PAP.           ______________________________  Marcelyn Bruins, M.D. [facility]  Diplomate, American Board of Sleep  Medicine     KC/MEDQ  D:  07/27/2005 14:53:14  T:  07/27/2005 22:28:19  Job:  [id number]

## 2011-02-24 NOTE — Discharge Summary (Signed)
NAME:  Karen Barron, Karen Barron               ACCOUNT NO.:  [id number]   MEDICAL RECORD NO.:  [id number]          PATIENT TYPE:  INP   LOCATION:  3713                         FACILITY:  [facility]   PHYSICIAN:  Rollene Rotunda, M.D.   DATE OF BIRTH:  [birth year]   DATE OF ADMISSION:  11/10/2005  DATE OF DISCHARGE:  11/15/2005                                 DISCHARGE SUMMARY   PRIMARY CARDIOLOGIST:  Rollene Rotunda, M.D., (new).   PRINCIPAL DIAGNOSES:  1.  Noncardiac chest pain.      1.  Nonobstructive coronary artery disease by cardiac catheterization          November 13, 2005.      2.  Normal left ventricular function.  2.  Normocytic anemia.  3.  Question sleep apnea.  4.  Bilateral lower extremity edema.  5.  Abnormal chest x-ray.      1.  Embolized methyl methacrylate by follow-up chest CT scan.   SECONDARY DIAGNOSES:  1.  Hypertension.  2.  Dyslipidemia.  3.  Hypothyroidism.  4.  Multiple myeloma.   PROCEDURE:  Coronary angiogram November 13, 2005, minor irregularity of LAD  without high grade focal obstruction; preserved left ventricular function -  November 13, 2005.   REASON FOR ADMISSION:  Karen Barron is a 77 year old female with prior history  of normal coronary angiogram 2001, but with multiple cardiac risk factors,  who presented to the emergency room with recurrent chest pain.  She was  admitted by Dr. Antoine Poche for further evaluation and management.   HOSPITAL COURSE:  Patient ruled out for myocardial infarction with all  serial cardiac markers within normal limits.  However, given her  presentation with some aspects which were typical for ischemia as well as  associated risk factors, recommendation was to proceed with diagnostic  coronary angiography.   Cardiac catheterization was performed on November 13, 2005, by Dr. Riley Kill  (see report for full details), revealing nonobstructive coronary artery  disease with normal left ventricular function.   Subsequent work-up  consisted of a follow-up chest CT scan for further  evaluation of an abnormal chest x-ray.  This confirmed the presence of  embolized methyl methacrylate, which corresponded to patient undergoing  prior kyphoplasty/vertebroplasty.  There was also no evidence of pulmonary  embolus.   At this point, no further in-house work-up was recommended.   Of note, the patient does have normocytic anemia and will be discharged with  Hemoccult cards, and follow up with her primary care physician.   Patient also presented with significant bilateral lower extremity edema.  Dr. Antoine Poche felt that her pretest probability for DVT is low.  Nevertheless, patient will be scheduled for follow-up lower extremity venous  Dopplers in our office to exclude DVT.   Additionally, patient presents with question of sleep apnea.  Dr. Antoine Poche  recommends follow-up pulmonary evaluation for consideration of CPAP therapy.   Patient will be discharged with resumption of all home medications.  She  will also be discharged on low dose aspirin.   DISCHARGE MEDICATIONS:  1.  Nexium 40 mg daily.  2.  Teveten 600  mg daily.  3.  Zocor 20 mg nightly.  4.  Amitriptyline 10 mg nightly.  5.  Furosemide 40 mg daily.  6.  Synthroid 0.375 mg daily.  7.  Levbid 0.375 mg b.i.d.  8.  Fish oil 1000 mg t.i.d.  9.  Glucosamine chondroitin as previously directed.  10. Lyrica 75 mg b.i.d.  11. Boniva as previously directed.  12. Iron supplement as directed.  13. Chromagen daily.   LABORATORY DATA:  Serial cardiac markers:  Normal.  Lipid profile:  Total  cholesterol 141, triglycerides 136, HDL 52, LDL 62.  TSH 7.33.  Potassium  4.1 on admission - 3.3 - 4.1 at discharge.  Hemoglobin 12.6, hematocrit 37  on admission - low 9.8 - follow-up 9.9 at discharge with normal MCV.  WBC  and platelets have remained normal.   FOLLOW UP:  1.  Patient is scheduled for lower extremity venous Dopplers on Monday,      November 20, 2005, at 2:30  p.m. at [facility].  2.  Patient will follow up with Dr. Marcelyn Bruins on November 30, 2005, at 9      a.m. for pulmonary evaluation.  3.  Patient is instructed to arrange follow-up with Dr. Marga Melnick in      the next two weeks.   DISCHARGE DURATION TIME:  50 minutes.      Gene Serpe, P.A. [facility]    ______________________________  Rollene Rotunda, M.D.    GS/MEDQ  D:  11/15/2005  T:  11/15/2005  Job:  [id number]   cc:   Titus Dubin. Alwyn Ren, M.D. [facility]  [phone]  [place] [address]   Marcelyn Bruins, M.D. [facility]  [address]  [place]  [place] [address]

## 2011-02-24 NOTE — H&P (Signed)
NAME:  Karen Barron, Karen Barron               ACCOUNT NO.:  [id number]   MEDICAL RECORD NO.:  [id number]            PATIENT TYPE:   LOCATION:                                 FACILITY:   PHYSICIAN:  Almedia Balls. Ranell Patrick, M.D. DATE OF BIRTH:  1926/08/22   DATE OF ADMISSION:  02/23/2006  DATE OF DISCHARGE:                                HISTORY & PHYSICAL   CHIEF COMPLAINT:  Left shoulder pain.   HISTORY OF PRESENT ILLNESS:  The patient is having some worsening left  shoulder pain.  It has been refractory to any type of conservative  treatment.  The patient has elected to have a left hemi versus total  shoulder arthroplasty by Dr. Malon Kindle on Feb 23, 2006.   ALLERGIES:  1.  INDOCIN.  2.  GUAIFENESIN.  3.  ALEVE.   CURRENT MEDICATIONS:  1.  Nexium 35 mg p.o. daily.  2.  Lyrica 75 mg b.i.d.  3.  Teveten 600 mg p.o. daily.  4.  Synthroid 50 mcg p.o. daily.  5.  Lasix 40 mg b.i.d.  6.  Boniva 150 mg once a week.  7.  Zocor 20 mg p.o. daily.  8.  Amitriptyline 10 mg q.h.s.   PAST MEDICAL HISTORY:  1.  Asthma.  2.  Migraines.  3.  GERD.  4.  Diverticulitis.  5.  Cancer.   FAMILY MEDICAL HISTORY:  CVA, osteoarthritis and cancer.   REVIEW OF SYSTEMS:  Painful range of motion of the left shoulder and  significant pedal edema bilaterally.   PHYSICAL EXAMINATION:  VITAL SIGNS:  Pulse 82, respirations 18, blood  pressure 140/68.  GENERAL:  The patient is a healthy-appearing 77 year old female in no acute  distress.  Pleasant mood and affect.  Alert and oriented x3.  HEAD/NECK:  Full range of motion without any difficulty.  No tenderness.  Cranial nerves II-XII were grossly intact.  CHEST:  Active breath sounds bilaterally with no wheezes, rhonchi or rales.  HEART:  A 2/6 systolic murmur.  No signs of any gallop.  ABDOMEN:  Nontender, nondistended, with active bowel sounds.  EXTREMITIES:  Painful range of motion of the left shoulder, anything beyond  20 degrees of external rotation or  forward flexion past 70 degrees.  She  does have some moderate pedal edema that is pitting in the bilateral lower  extremities.  No rashes were seen.   X-ray shows left shoulder end-stage osteoarthritis.   IMPRESSION:  Left shoulder osteoarthritis, end-stage.   PLAN:  The plan is to have a left hemiarthroplasty versus total arthroplasty  by Dr. Malon Kindle on Feb 23, 2006.      Thomas B. Dixon, P.A.    ______________________________  Almedia Balls. Ranell Patrick, M.D.    TBD/MEDQ  D:  02/15/2006  T:  02/15/2006  Job:  [id number]

## 2011-02-24 NOTE — Discharge Summary (Signed)
NAME:  Karen Barron, Karen Barron               ACCOUNT NO.:  [id number]   MEDICAL RECORD NO.:  [id number]          PATIENT TYPE:  INP   LOCATION:  5016                         FACILITY:  [facility]   PHYSICIAN:  Almedia Balls. Ranell Patrick, M.D. DATE OF BIRTH:  [birth year]   DATE OF ADMISSION:  02/23/2006  DATE OF DISCHARGE:  02/26/2006                                 DISCHARGE SUMMARY   ADMISSION DIAGNOSES:  1.  Left shoulder pain secondary to end-stage osteoarthritis.  2.  Asthma.  3.  Migraines.  4.  Gastroesophageal reflux disease.  5.  Diverticulitis.  6.  History of cancer.   DISCHARGE DIAGNOSES:  1.  Left shoulder pain secondary to end-stage osteoarthritis status post      left hemiarthroplasty.  2.  Asthma.  3.  Migraines.  4.  Gastroesophageal reflux disease.  5.  Diverticulitis.  6.  History of cancer.   BRIEF HISTORY:  The patient is a 77 year old female who had worsening of  shoulder pain and elected to have left shoulder hemiarthroplasty by Dr.  Malon Kindle.   PROCEDURES:  The patient had a left shoulder  hemiarthroplasty on Feb 23, 2006, using DuPuy arthroscopy system.  Surgeon was [employer]. Ranell Patrick, M.D.  Assistant was [employer]. Dixon, P.A.-C.  Estimated blood loss was minimal.  Full replacement was 1100 mL.  Urine output was 400 mL.  Perioperative  antibiotics were given.   HOSPITAL COURSE:  The patient was admitted for the above-stated procedure on  Feb 23, 2006, which she tolerated well and went to the postanesthesia care  unit.  She was transferred to 5000.  On postop day #1, she drifted down from  her initial hemoglobin of 10.1 and hematocrit 29.4 to hemoglobin 8.6,  hematocrit 25.  She was transfused 2 units which she did tolerate very well  and did respond to very well.   On postop day #2, she was feeling much better. She still had some mild pain  in the left shoulder and was otherwise doing well.   On postop day #3, she was walking around the room without any difficulty,  very minimal discomfort in that left shoulder, and it was determined to send  her home on postop day #3.  The patient had some mild atelectasis with T-max  of 101 on postop day 2, but it resolved before discharge on postop day #3.  The patient is being discharged home on Feb 26, 2006.   ALLERGIES:  INDOMETHACIN, NAPROXEN, GUAIFENESIN.   DISCHARGE MEDICATIONS:  1.  Percocet 1 to 2 tablets q. 4-6 h p.r.n. pain, dose is 5/325.  2.  Robaxin 500 mg p.o. q. 6 h p.r.n. pain.  3.  Elavil 10 mg p.o.  nightly.  4.  Colace 100 mg p.o. twice daily.  5.  Lasix 40 mg p.o. twice daily.  6.  Levothyroxine 15 mcg p.o. daily.  7.  Protonix 40 mg p.o. daily.  8.  Lyrica 75 mg p.o. twice daily.  9.  Zocor 20 mg p.o. daily.   DIET:  Regular.   ACTIVITY:  Nonweightbearing with left upper extremity with  sling on at all  times.      Thomas B. Dixon, P.A.    ______________________________  Almedia Balls. Ranell Patrick, M.D.    TBD/MEDQ  D:  02/26/2006  T:  02/26/2006  Job:  [id number]

## 2011-02-24 NOTE — Assessment & Plan Note (Signed)
[facility]   NAME:  Barron, Karen               ACCOUNT NO.:  [id number]   MEDICAL RECORD NO.:  [id number]      DATE OF BIRTH:  [birth year]   PHYSICIAN:  Jake Shark A. Tanda Rockers, M.D. VISIT DATE:  02/11/2007                                   OFFICE VISIT   SUBJECTIVE:  Karen Barron is a 77 year old lady who we initially saw for  venous stasis.  In the interim we have treated her with compression  wraps and we have made a transition to external compression hose.  She  returns for followup.  She reports that there has been significant  improvement with decreased swelling, decrease pain.  She is not  ambulating and relatively pain free.  She denies shortness of breath or  fever.   OBJECTIVE:  Blood pressure is 133/63, respirations 18, pulse rate 87,  temperature 97.9.   EXAMINATION:  Inspection of the lower extremities shows that the edema  is reasonably controlled with well-fitting compression hose. There are  no ulcerations.  The prominent reticula veins are present.  Pedal pulses  remain palpable.  The patient is neurologically intact.  There is no  evidence of ischemia.   ASSESSMENT:  Resolved and markedly improved stasis.   PLAN:  We are discharging the patient from the clinic.  We have given  her a prescription for bilateral, below the knee 30 to 40 mm external  compression hose.  We have instructed both Mr. and Ms. Blumenfeld in the  application and the use of external compression as the main therapy for  venous insufficiency and stasis.  We have encouraged the patient to  ensure that her stockings remain well-fitting and this will require  change in her stockings every 3 to 4 months.  If she develops  progressive swelling, injury, blister, or ulceration she should request  a followup visit at the wound center.  We have given the patient and her  husband an opportunity to ask questions.  They seem to understand.  They  express gratitude for having been seen in the  clinic and indicate that  they will be compliant as per above.      Harold A. Tanda Rockers, M.D.  Electronically Signed     HAN/MEDQ  D:  02/11/2007  T:  02/11/2007  Job:  [id number]   cc:   Titus Dubin. Alwyn Ren, MD,FACP,FCCP

## 2011-02-24 NOTE — Assessment & Plan Note (Signed)
[facility]   NAME:  Bove, Eriona               ACCOUNT NO.:  [id number]   MEDICAL RECORD NO.:  [id number]      DATE OF BIRTH:  [birth year]   PHYSICIAN:  Jake Shark A. Tanda Rockers, M.D.      VISIT DATE:                                   OFFICE VISIT   PURPOSE OF TODAY'S VISIT:  Ms. Danh is a 77 year old lady who we are  seeing for severe stasis and cellulitis.  She was last seen on January 31, 2007, we placed her in bilateral Profore wraps and proceed with a  priority duplex scan to rule out DVT which was ultimately negative.  She  continued in the Profore wraps, but discontinued them complaining of  pain today.   WOUND EXAM:  Inspection of the lower extremity shows that the edema is  reasonably controlled.  The pedal pulses remain full.  There is no  evidence of superficial phlebitis, cellulitis or infection whatsoever.   DIAGNOSIS:  Improved stasis.   OTHER:  On physical exam, her blood pressure is 123/61, respirations 18,  pulse rate 88, temperature 97.7.   MANAGEMENT GOALS AND PLANS:  We have called [facility] and made arrangements for the patient to be seen this afternoon  for fitting of bilateral below-the-knee open-toe 30 to 40 mm compression  hose.  The patient and her husband agreed to be seen there this  afternoon to be fitted for the garment.  We will reevaluate her in one  week to assess her response to therapy.      Harold A. Tanda Rockers, M.D.  Electronically Signed     HAN/MEDQ  D:  02/04/2007  T:  02/04/2007  Job:  [id number]

## 2011-02-24 NOTE — Op Note (Signed)
NAME:  Karen Barron, Karen Barron               ACCOUNT NO.:  [id number]   MEDICAL RECORD NO.:  [id number]          PATIENT TYPE:  OUT   LOCATION:  CARD                         FACILITY:  [facility]   PHYSICIAN:  Oley Balm. Sung Amabile, MD   DATE OF BIRTH:  Sep 25, 1926   DATE OF PROCEDURE:  12/11/2006  DATE OF DISCHARGE:  12/11/2006                               OPERATIVE REPORT   PROCEDURE:  Cardiopulmonary stress test.   INDICATION FOR TESTING:  Exertional dyspnea.   DESCRIPTION OF PROCEDURE:  Cardiopulmonary stress testing was performed  on a graded treadmill.  Testing was stopped due to dyspnea.  Effort was  submaximal.  Oxygen uptake at peak exercise was 10.7 mL/kg per minute or  56% of predicted maximum indicating moderate exercise impairment  compared to an average age mass population.  When corrected for body  weight, maximum oxygen uptake increases to 75% of predicted.   At peak exercise, heart rate was 113 beats per minute or 79% of  predicted maximum indicating that cardiovascular reserve remained.  Oxygen pulse was normal suggesting a normal stroke volume.  Blood  pressure response was normal.  EKG tracings revealed nonspecific ST and  T-wave changes.   At peak exercise minute ventilation was 30.2 liters per minute or 60% of  predicted maximum indicating that ventilatory reserve remained.  Gas  exchange parameters revealed no abnormalities.  Baseline spirometry  revealed mild air flow obstruction.  Post exercise spirometry revealed  no exercise induced bronchospasm.   SUMMARY:  Moderate exercise impairment due to submaximal effort and  obesity.  Extrapolation of the data suggest that she would have at least  mild limitation even if her effort had been maximal.  She approached  both cardiovascular and ventilatory limitations simultaneously.  There  was no overt cardiovascular pathology seen.  Mild obstruction is noted  on spirometry.      Oley Balm Sung Amabile, MD  Electronically  Signed     DBS/MEDQ  D:  12/22/2006  T:  12/23/2006  Job:  [id number]   cc:   Barbaraann Share, MD,FCCP  [address]  [place]  [place] [address]

## 2011-02-24 NOTE — Cardiovascular Report (Signed)
NAME:  Karen Barron, Karen Barron               ACCOUNT NO.:  [id number]   MEDICAL RECORD NO.:  [id number]          PATIENT TYPE:  INP   LOCATION:  3713                         FACILITY:  [facility]   PHYSICIAN:  Arturo Morton. Riley Kill, M.D. [facility] OF BIRTH:  September 03, 1926   DATE OF PROCEDURE:  11/13/2005  DATE OF DISCHARGE:                              CARDIAC CATHETERIZATION   INDICATIONS:  Karen Barron is a 77 year old who has had previous catheterization  2001. She presented with recurrent chest pain. She has had a back procedure  recently. The current study was set up by Dr. Antoine Poche; and the patient was  evaluated for cardiac catheterization. Risks, benefits, and alternatives  were discussed with the patient by Dr. Antoine Poche. I repeated this as the  patient arrived in the laboratory.   PROCEDURE:  1.  Left heart catheterization.  2.  Selective coronary territory.  3.  Selective left ventriculography.   DESCRIPTION OF THE PROCEDURE:  The patient was brought to the  catheterization laboratory, and prepped and draped in the usual fashion.  Through an anterior puncture the right femoral artery was easily entered;  and a 6-French sheath was placed. Following this, central aortic and left  ventricular pressures were measured with a pigtail. Ventriculography was  performed in the RAO projection. Following this, standard coronary territory  was performed with Judkins catheters. She tolerated procedure well; and  there no complications. She was taken to the holding area in satisfactory  clinical condition.   HEMODYNAMIC DATA:  1.  Central aortic pressure 143/66, mean 99.  2.  Left ventricular pressure 134/an EDP of 15-26  3.  After stabilization, there did not appear to be a significant gradient      on pullback across aortic valve.   ANGIOGRAPHIC DATA:  1.  Ventriculography was performed in the RAO projection. Because of      ventricular ectopy an ejection fraction could not be calculated;  nonetheless, overall systolic function appeared to be well preserved      with an ejection fraction in excess of 55%.  2.  The left main coronary artery was smooth and free of critical disease.  3.  The left anterior descending artery courses to the apex. There was a      major septal perforator proximally. There was minor luminal irregularity      throughout the proximal LAD, but no significant areas of focal      obstruction. The LAD in the mid-and-distal supplies two small diagonal      branches, and wraps the apex without significant focal narrowing. The      distal LAD appears smooth.  4.  The circumflex provides a large atrial circumflex branch and then a      large bifurcating marginal branch. The circumflex and its system appears      to be without significant focal obstruction.   CONCLUSIONS:  1.  Preserved overall left ventricular function.  2.  Minor luminal irregularity of the left anterior descending artery      without high-grade focal obstruction.   DISPOSITION:  Further evaluation will be left to Dr.  Hochrein.   ADDENDUM:  We also want to note as an addendum, bilateral hyperdense  densities are noted in the lung fields bilaterally with embolized methyl  methacrylate as noted on previous chest x-ray.  I am not sure as to the  clinical significance of this; will defer this to Dr. Antoine Poche as well.      Arturo Morton. Riley Kill, M.D. [facility]  Electronically Signed     TDS/MEDQ  D:  11/13/2005  T:  11/13/2005  Job:  [id number]   cc:   CV Laboratory   Rollene Rotunda, M.D.  [address]  [address]  [place]  [place] [address]   Titus Dubin. Alwyn Ren, M.D. [facility]  [phone] [place]  [place] [address]

## 2011-02-24 NOTE — Op Note (Signed)
NAME:  Karen Barron, Karen Barron               ACCOUNT NO.:  [id number]   MEDICAL RECORD NO.:  [id number]          PATIENT TYPE:  OIB   LOCATION:  5021                         FACILITY:  [facility]   PHYSICIAN:  Sharolyn Douglas, M.D.        DATE OF BIRTH:  1925-12-22   DATE OF PROCEDURE:  08/16/2005  DATE OF DISCHARGE:                                 OPERATIVE REPORT   DIAGNOSIS:  T11 compression fracture.   PROCEDURE:  1.  T11 kyphoplasty.  2.  Deep bone biopsy, T11 vertebral body.  3.  Radiographic interpretation of fluoroscopic images used for kyphoplasty.   SURGEON:  Sharolyn Douglas, M.D.   ASSISTANT:  None.   ANESTHESIA:  General endotracheal.   ESTIMATED BLOOD LOSS:  Minimal.   COMPLICATIONS:  None.   INDICATIONS:  The patient is a pleasant 77 year old female with persistent  back pain. A bone scan revealed increased uptake around the T11 vertebral  body and several ribs. She has a questionable history of myeloma. She has  failed to respond to conservative measures and therefore we will proceed  with biopsy of the T11 vertebral body and kyphoplasty in hopes of improving  her pain and preventing further diagnostic information.   DESCRIPTION OF PROCEDURE:  The patient was identified in the holding area  and taken to the operating room. She underwent general endotracheal  anesthesia without difficulty and given prophylactic IV antibiotics. She was  carefully positioned prone on bolsters, chest and pelvis to allow possible  reduction of the deformity. The bony prominences were padded. The arms were  carefully positioned. We did note that the left shoulder was quite stiff and  she has had previous surgery. The face and eyes were protected. The back was  prepped and draped in the usual sterile fashion. Biplanar fluoroscopy was  brought into the field, obtaining true AP and lateral images of the T11  vertebral body.   A small stab incision was made just to the left of the T11 pedicle. A  Jamshidi  needle was used to cannulate the pedicle under live fluoroscopy. A  guidewire was placed in the vertebral body. A working cannula was  established. A deep core bone biopsy was obtained and sent to pathology.  The intervertebral bone tamp was then placed into the vertebral body through  the working cannula and inflated to 6 cc. We observed a partial reduction of  vertebral endplate. The bone tamp was removed and 6 cc of partially hardened  methyl methacrylate cement pushed through the working cannula into the  vertebral body. The working cannula was removed and final AP and lateral  images  saved. A single nylon suture was used on the small stab incision. A sterile  dressing was applied, the patient was turned supine, extubated without  difficulty, and transferred to recovery in stable condition able to move her  upper and lower extremities.      Sharolyn Douglas, M.D.  Electronically Signed     MC/MEDQ  D:  08/16/2005  T:  08/17/2005  Job:  [id number]

## 2011-02-24 NOTE — Op Note (Signed)
[facility]  Patient:    Karen Barron, Karen Barron                MRN: [id number] Proc. Date: 04/18/01 Adm. Date:  [id number] Attending:  Skip Mayer                           Operative Report  SURGEON:  Georges Lynch. Darrelyn Hillock, M.D.  ASSISTANT:  Ralene Bathe, P.A.-C.  PREOPERATIVE DIAGNOSES: 1. Abrasion with severe impingement of the rotator cuff tendon on the left. 2. Early degenerative arthritis, left shoulder. 3. Severe impingement syndrome resulting from her clavicle and acromion.  POSTOPERATIVE DIAGNOSES: 1. Abrasion with severe impingement of the rotator cuff tendon on the left. 2. Early degenerative arthritis, left shoulder. 3. Severe impingement syndrome resulting from her clavicle and acromion.  OPERATIONS: 1. Resection distal clavicle, left shoulder. 2. Partial acromionectomy, left shoulder. 3. Excision of subdeltoid bursa, left shoulder. 4. Resection of the coracoacromial ligament, left shoulder. 5. Exploration of the rotator cuff.  DESCRIPTION OF PROCEDURE:  Under general anesthesia, routine orthopedic prepping and draping of the left shoulder was carried out.  She had 1 gram of IV Ancef.  At this time, an incision was made over the anterior aspect of the left shoulder, bleeders identified and cauterized.  I then stripped the deltoid tendon from the overlying acromion.  I exposed the acromion.  The distal clavicle and acromion looked as though she had a congenital deformity there, where it actually looked as though it was one unit, but there was a definite separation in the joint, but both were impinging as a unit on the rotator cuff.  What I did was protected the rotator cuff with a Bennett retractor, resected the distal clavicle and and acromion.  The portion of the distal clavicle I resected was the portion that I thought was impinging.  I also resected the coracoacromial ligament.  Following this, we had a good exposure to the  tendon.  There were no signs of any gross tears in the tendon. It was somewhat irritated and abrased, and we noticed that once we removed the subdeltoid bursa.  The bursa was chronically inflamed.  I thoroughly irrigated out the area.  I bone waxed the raw bone ends.  I then reattached the deltoid tendon and muscle in the usual fashion, and a couple of drill holes were made in the distal clavicle resection site to reinforce the muscle attachment there.  The wound was then irrigated and closed in the usual fashion.  Sterile dressings were applied.  She was placed in the shoulder immobilizer. DD:  04/18/01 TD:  04/18/01 Job: [id number]

## 2011-02-24 NOTE — Discharge Summary (Signed)
[facility]  Patient:    Karen Barron, Karen Barron                MRN: [id number] Adm. Date:  [id number] Disc. Date: [id number] Attending:  Tresa Garter CC:         Danice Goltz, M.D.  Titus Dubin. Alwyn Ren, M.D. [facility]   Discharge Summary  DATE OF BIRTH:  [birth year]  DISCHARGE DIAGNOSES: 1. Tracheobronchitis, probable pneumonitis. 2. 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 3. Gastroesophageal reflux disease. 4. Hypertension. 5. Asthmatic bronchitis by history. 6. Hypoxemia, resolved. 7. Hypothyroidism, treated. 8. Hypertension, treated.  DISCHARGE MEDICATIONS: 1. HCTZ 25 mg daily. 2. Duratuss one twice a day. 3. Tequin 400 mg one once a day for seven days. 4. Avapro 300 mg once a day. 5. Maxair Autohaler two puffs four times a day. 6. Zocor 10 mg at night. 7. Nexium 40 mg daily. 8. Prednisone 10 mg daily for three days, then 5 mg daily for seven days, and    then stop.  Take with meals.  DIET:  No added salt.  SPECIAL INSTRUCTIONS:  Call for problems.  FOLLOW-UP:  Follow-up visit with Titus Dubin. Alwyn Ren, M.D., in one week.  SPECIAL PROCEDURES: 1. CT scan of the chest. 2. Echocardiogram.  CONSULTANTS:  Danice Goltz, M.D., pulmonary medicine.  HISTORY OF PRESENT ILLNESS:  The patient is a 77 year old white female who was admitted by myself on November 15, 2000, with cough, shortness of breath, and purulent sputum.  She was found to be hypoxemic.  Chest x-ray with possible right lower lobe infiltrate.  For further details are included in the history and physical.  HOSPITAL COURSE:  During the course of hospitalization, the patient was making slow progress in the improvement of her hypoxemia and shortness of breath. She was treated with IV steroids, antibiotics, breathing treatments, and oxygen.  A CT scan was done due to refractory hypoxemia to rule out PE.  It was negative for PE, but did reveal some alveolitis  bilaterally.  She also underwent an echocardiogram which was unremarkable, except for some ventricular wall thickening.  On November 15, 2000, she was feeling better, the lungs had bilateral rhonchi.  Heart regular.  Lower extremities without edema. She was neurologic intact.  Her temperature was 99 degrees and blood pressure 137/60.  The blood gas on room air showed a pH of 7.48, pCO2 34, pO2 87.3, and O2 saturation 93% on room air.  LABORATORY WORK:  The blood gas on admission revealed a pH of 7.44, pCO2 36, and pO2 58.  White count 12.5 on admission and hemoglobin 11.9.  Potassium normal, sodium normal.  Liver tests normal.  CK normal.  TSH 1.14.  Chest CT scan with ground glass appearance, nonspecific, attributed to alveolitis, and negative for PE.  Chest x-ray with passive vascular congestion without edema.  Echocardiogram:  Left ventricular size normal, mildly increased LV wall thickness, no significant valvular abnormalities.  The EKG showed sinus tachycardia. DD:  11/20/00 TD:  11/20/00 Job: [id number]

## 2011-02-24 NOTE — Discharge Summary (Signed)
NAME:  Karen Barron, Karen Barron               ACCOUNT NO.:  [id number]   MEDICAL RECORD NO.:  [id number]          PATIENT TYPE:  INP   LOCATION:  3010                         FACILITY:  [facility]   PHYSICIAN:  Sharolyn Douglas, M.D.        DATE OF BIRTH:  Mar 30, 1926   DATE OF ADMISSION:  06/09/2004  DATE OF DISCHARGE:  06/13/2004                                 DISCHARGE SUMMARY   ADMISSION DIAGNOSES:  1.  Spondylolisthesis L5-S1.  2.  Hypothyroidism.  3.  Gastroesophageal reflux disease.  4.  Multiple myeloma.   DISCHARGE DIAGNOSES:  1.  Status post L5-S1 posterior spinal fusion, doing well.  2.  Postoperative anemia, asymptomatic, and did not require transfusion.  3.  Spondylolisthesis L5-S1.  4.  Hypothyroidism.  5.  Gastroesophageal reflux disease.  6.  Multiple myeloma.   CONSULTATIONS:  None.   PROCEDURE:  On June 09, 2004, the patient was taken for the operating  room for L5-S1 revision laminectomy as well as posterior spinal fusion with  pedicle screws. This was performed by Dr. Sharolyn Douglas, assistant was [facility], PA-C. Anesthesia used was general.   LABORATORY DATA:  On June 07, 2004, CBC with differential within normal  limits with exception of red cells 3.6, hemoglobin 11.5, hematocrit 33.7.  Postoperatively H&H was 10.3 and 30.0, again asymptomatic and did not  require transfusion. PT, INR, and PTT on June 07, 2004, were within normal  limits. Complete  metabolic panel on the same date were within normal  limits. Basic metabolic panel from June 10, 2004, was within normal  limits with the exception of glucose slightly elevated at 120. UA from  June 07, 2004, was negative. Blood typing from June 07, 2004, was type  A, RH type positive, antibody screen negative.   EKG from August 30th showed normal sinus rhythm, read by Dr. Olga Millers.  X-rays from June 07, 2004, with chest x-ray showing no active  cardiopulmonary disease, thoracolumbar vertebral body  compression fracture  of indeterminate age. From June 09, 2004, x-ray was used  intraoperatively for localization and showed that posterior lumbar fusion  had been performed at L5-S1. Pedicle screws and posterior bars were intact  and in satisfactory position. Intraoperatively a C-arm was used for  localization. Portable chest x-ray on June 09, 2004, shows central line  in the right proximal atrium without pneumothorax, low lung volumes with  perihilar and bibasilar infiltrates or atelectasis.   BRIEF HISTORY:  This is a 77 year old lady seen by Dr. Noel Gerold with rest  problems related to her lower back and radiation to her buttocks  progressively getting worse over the past 10 years. It had gotten to the  point that it was severe and was not responding any conservative treatment.  It was felt that her best course of management, given  her severe symptoms  as well as MRI and x-ray findings, would be a revision laminectomy and  posterior spinal fusion. Risks and benefits of this procedure were discussed  with the patient by Dr. Noel Gerold and myself. She indicated understanding and  wished to proceed.  HOSPITAL COURSE:  On June 09, 2004, the patient was taken to the  operating room for the above listed procedure. She tolerated the procedure  well without any intraoperative complications. She was transferred to the  recovery room in stable condition. There was one Hemovac drain placed  intraoperatively. Postoperatively, routine orthopedic spine protocol was  followed including 48 hours of prophylactic antibiotics. Diet was NPO until  she passed flatus, at which time it was slowly advanced to her regular diet.  DVT prophylaxis obtained by a combination of TED hose, SCDs as well as early  mobilization. Physical therapy and occupational therapy were consulted to  work with the patient on progressive ambulation program, brace use, back  precautions, and safety. She progressed along well  during the postoperative  period. She did not develop any medical complications. Her Hemovac drain was  discontinued without difficulty on postoperative day two. By June 13, 2004, she had met all orthopedic goals, she was ambulating independently,  safe with physical therapy, and she was medically stable and ready for  discharge.   DISCHARGE/PLAN:  The patient is a 77 year old female status post L5-S1  revision laminectomy and posterior spinal fusion, doing well.   ACTIVITY:  Daily ambulation. She is to have a brace on when she is up, back  precautions at all times, no heavy lifting heavier than 5 pounds, keep the  incision dry times five days, and may shower on postoperative day five.   DIET:  Regular home diet.   MEDICATIONS ON DISCHARGE:  Vicodin for pain, Robaxin for muscle spasms,  multivitamin daily, calcium daily, laxative as needed. Avoid NSAIDs times  three months and resume home medications.   FOLLOWUP:  Two weeks postoperatively with Dr. Noel Gerold.   CONDITION ON DISCHARGE:  Stable and improved.   DISPOSITION:  The patient is to be discharged to her home with her family  who will assist her as well as home health physical therapy and occupational  therapy.       CM/MEDQ  D:  07/20/2004  T:  07/20/2004  Job:  [id number]

## 2011-02-24 NOTE — Assessment & Plan Note (Signed)
[facility]                             PULMONARY OFFICE NOTE   NAME:Rosenfield, NIGERIA LASSETER                      MRN:          [id number]  DATE:01/09/2007                            DOB:          16-Nov-1925    SUBJECTIVE:  [name] comes in today for followup of her dyspnea on  exertion.  She has had a fairly extensive pulmonary cardio workup in the  past that has been unrevealing except for very minimal air trapping on  PFTs.  She was given a trial of bronchodilators and saw absolutely no  difference in her breathing.  She recently underwent a cardiopulmonary  stress test to try and elucidate her source of dyspnea, and was found to  have moderate exercise impairment due to submaximal effort and obesity.  There was no overt cardiovascular pathology seen, and only minimal air  trapping noted on spirometry.  Again, she has not responded to a trial  of bronchodilators in the past.  I continue to believe that most of Ms.  Pritz's problem is related to obesity and deconditioning, as well as  reflux causing upper airway symptoms.   PHYSICAL EXAMINATION:  BP is 146/70, pulse is 88, temperature is 97.6,  weight is 226 pounds, O2 saturation on room air is 97%.  IN GENERAL:  She is an obese female in no acute distress.   IMPRESSION:  Dyspnea on exertion that appears to be secondary to  significant obesity as well as deconditioning.  She has had a very  extensive cardiopulmonary workup with nothing of significance being  found.  I do think a lot of her upper airway symptoms are due to reflux  disease which will also be aided by weight loss.  I will leave further  management to her primary care physician.   SUGGESTIONS:  1. Work on weight loss.  2. Get on some kind of exercise/conditioning program.   The patient will follow up on a p.r.n. basis.     Barbaraann Share, MD,FCCP  Electronically Signed    [facility]/MedQ  DD: 01/09/2007  DT: 01/09/2007  Job #: [id number]   cc:   Leighton Roach. Truett Perna, M.D.  Titus Dubin. Alwyn Ren, MD,FACP,FCCP

## 2011-02-24 NOTE — Cardiovascular Report (Signed)
[facility]  Patient:    Karen Barron, Karen Barron                MRN: [id number] Proc. Date: 07/26/00 Adm. Date:  [id number] Attending:  Veneda Melter CC:         Titus Dubin. Alwyn Ren, M.D. [facility]  Veneda Melter, M.D. [facility]  Cardiac Catheterization Laboratory   Cardiac Catheterization  INDICATIONS:  The patient is a 77 year old, obese white female with a recent history of peripheral edema, left carotid bruit awakening with chest pain radiating to the left arm.  PROCEDURE:  Selective coronary angiography, left ventricular angiography, abdominal aortography--Judkins technique.  The patient tolerated the procedure well.  RESULTS:  PRESSURES:  LV systolic 143, diastolic 23.  AO systolic 134, diastolic 70.  ANGIOGRAPHY:  There is no calcifications noted. 1. Left main:  The left main coronary artery was normal. 2. Left anterior descending:  The left anterior descending coronary was    normal. 3. Circumflex:  The circumflex coronary was normal. 4. Right coronary artery:  The right coronary artery reveals    some mild irregularities proximally, otherwise normal. 5. Left ventricle:  The left ventricle was normal. 6. Abdominal aortogram:  The abdominal aortogram revealed normal    renal arteries.  Normal abdominal aorta.  SUMMARY: 1. Normal selective coronary angiography with mild irregularity of the    proximal right coronary artery. 2. Normal left ventricular angiography. 3. Normal abdominal aortography.  The patient is to have a 2-D echocardiogram and a carotid Doppler as scheduled. DD:  07/26/00 TD:  07/27/00 Job: [id number]

## 2011-02-24 NOTE — Consult Note (Signed)
[facility]  Patient:    Karen Barron, Karen Barron                        MRN: [id number] Proc. Date: 04/15/01 Attending:  Titus Dubin. Alwyn Ren, M.D. [facility] CC:         Georges Lynch. Darrelyn Hillock, M.D.  Sung Amabile Roslyn Smiling, M.D.   Consultation Report  DATE OF BIRTH:  Feb 08, 1926  HISTORY OF PRESENT ILLNESS:  Karen Barron is scheduled for rotator cuff surgery April 09, 2001 at [facility]. A medical clearance has been requested. She was seen on April 10, 2001 at the age 77.  The shoulder syndrome is associated with decreased range of motion and positional pain. She has been taking the Celebrex and has received cortisone injections with suboptimal response.  PAST MEDICAL HISTORY:  Pneumonia in February of this year. She has had back surgery, total knee replacement bilaterally, cataract surgery bilaterally, three pregnancies, two deliveries, a cesarean section and three colonoscopies. She has also had a laparoscopic procedure. She has had no perioperative complications except for a "overdose" with pain medicines at the time of her last total knee replacement.  MEDICAL PROBLEMS:  Asthma and osteoporosis. She also has evidence of peripheral vascular disease with a right carotid bruit. Colonoscopies have revealed colon polyps.  There is no family history of myocardial infarction or stroke. Her mother had bronchitis and grandmother asthma.  She has never smoked. She is sedentary.  She is on hormone replacement from Dr. Jake Church, Premarin 0.625 mg daily, Synthroid 28 mcg 1.5 daily, Nexium 40 mg for esophageal reflux, Levbid 0.375 mg one to two as needed for irritable bowel, Zocor 10 mg at bedtime for hyperlipidemia. She is on Fosamax 70 mg weekly; she is no longer on the celebrex. She is intolerant/allergic to Indocin and guiafenesin.  REVIEW OF SYSTEMS:  Reveals intermittent shortness of breath. She does describe some easy bruising.  PHYSICAL EXAMINATION:  GENERAL:   She appears to be conditioned; she is 5 foot 4 3/4 inches and weighs 219. Safe weight would be 165 or less.  VITAL SIGNS:  Temperature is 98.9, pulse 82 and regular. Respiratory rate 22 and blood pressure 120/62.  NECK:  Thyroid is normal to palpation. There is no neck vein distention.  CHEST:  Clear to auscultation.  HEART:  An S4 is noted with grade 1 systolic murmur. There is splitting of a second heart sound. I could not appreciate the right carotid bruit at this time but it had been heard in April of this year.  Posterior tibial pulses are decreased. She has venous spiders over the posterior trunk  and over her legs.  ABDOMEN:  Protuberant without organomegaly or masses. She had no lymphadenopathy.  EXTREMITIES:  ______  lesion was noted of the left shoulder in the right antecubital area.  EKG reveals a migratory baseline but no ischemic change. There are minor nonspecific ST/T wave changes.  At this time, there are no contraindications to the planned surgery. Her hypertension and hypothyroidism are controlled based on exam and recent labs. Specifically her TSH was 3.60 on April 3. Extensive laboratory profile at that time was also normal with excellent lipid values.  Because of the bruising, it was recommended that she have a PT and PTT preoperatively. Because of a murmur, SV prophylaxis would be recommended.  She will be seen in the hospital as per Dr. Joellyn Rued request. [facility] Service can be called should medical problems  arise. DD:  04/15/01 TD:  04/15/01 Job: [id number]

## 2011-02-24 NOTE — Op Note (Signed)
NAME:  Karen Barron, Karen Barron                         ACCOUNT NO.:  [id number]   MEDICAL RECORD NO.:  [id number]                   PATIENT TYPE:  OIB   LOCATION:  2550                                 FACILITY:  [facility]   PHYSICIAN:  Sharolyn Douglas, M.D.                     DATE OF BIRTH:  [birth year]   DATE OF PROCEDURE:  06/09/2004  DATE OF DISCHARGE:                                 OPERATIVE REPORT   PREOPERATIVE DIAGNOSIS:  Grade 2 spondylolisthesis L5-S1 status post  previous laminectomy with chronic back and bilateral lower extremity pain,  left greater than right.   POSTOPERATIVE DIAGNOSIS:  Grade 2 spondylolisthesis L5-S1 status post  previous laminectomy with chronic back and bilateral lower extremity pain,  left greater than right.   PROCEDURE:  1.  Revision L5-S1 laminectomy with wide foraminotomies, decompression of      the L5 and S1 nerve roots bilaterally.  2.  Posterior spinal arthrodesis L5-S1.  3.  Pedicle screw instrumentation, L5-S1, using the [employer] System.  4.  Local autogenous bone graft supplemented with 5 mL of bone graft      substitute.  5.  Neural monitoring utilizing free running and triggered EMGs.   SURGEON:  Sharolyn Douglas, M.D.   ASSISTANT:  Verlin Fester, P.A.   ANESTHESIA:  General endotracheal anesthesia.   COMPLICATIONS:  None.   INDICATIONS FOR PROCEDURE:  The patient is a pleasant 77 year old female  with severe back and bilateral lower extremity pain, left greater than  right.  She has a history of isthmic spondylolisthesis at L5-S1 status post  previous central laminectomy at that level.  She has been refractory at all  conservative treatment options and elected to undergo revision and  decompression of her L5-S1 foramen with posterior fusion.   PROCEDURE:  The patient was properly identified in the holding area and  taken to the operating room.  She underwent general endotracheal anesthesia  without difficulty.  She was given prophylactic IV  antibiotics.  She was  carefully positioned prone on the Wilson frame.  All bony prominences were  padded.  The face and eyes were protected at all times.  The back was  prepped and draped in the usual sterile fashion.  The previous midline  incision was opened and extended several mm proximally and distally.  Dissection was carried sharply through the deep fascia and previous scar  tissue.  The previous laminectomy was identified at L5-S1.  Loupe  magnification and headlight utilized throughout the procedure.  Tedious  dissection was performed carefully identifying the edges of the previous  laminectomy.  We found that the inferior facet of L5 was intact on the right  side.  It appeared that a hemi-Gill procedure had been performed on the  left.  We completed the Gill laminectomy.  We then completed wide  foraminotomy bilaterally radically decompressing the L5 nerve roots.  We  found that there was up down stenosis from the pedicle of L5 impinging on  the sacrum.  The S1 nerve roots were also identified and decompressed.  We  then turned our attention to completing a posterior spinal fusion.  A high  speed bur was used to decorticate L5 transverse processes and sacral ala  bilaterally.  Local autogenous bone graft supplemented with 5 mL Grafton  bone graft substitute packed into the lateral gutters.  We then placed  pedicle screws at L5 and S1 using anatomic probing technique as well as  fluoroscopy.  We utilized 7.5 by 45 mm screws in the sacrum and 6.5 by 50 mm  screws at L5.  We had marginal screw purchase because of the patient's poor  bone quality.  Each screw was stimulated using triggered EMGs and there were  no changes.  In addition, free running EMGs were monitored throughout the  decompression and there were no changes.  We placed the titanium rods and  tightened the locking caps.  The wound was irrigated.  Bleeding was  controlled with bipolar electrocautery and Gelfoam.  A deep  Hemovac drain  was left in place.  The deep fascia was closed with a running #1 Vicryl  suture.  The subcutaneous layer was closed with a 2-0 Vicryl followed by a  running 3-0 subcuticular suture on the skin edges.  Benzoin and Steri-Strips  were placed.  A sterile dressing was applied.  The patient was turned  supine, extubated without difficulty, and transferred to the recovery room  in stable condition able to move her upper and lower extremities.                                               Sharolyn Douglas, M.D.    MC/MEDQ  D:  06/09/2004  T:  06/09/2004  Job:  [id number]

## 2011-02-24 NOTE — Consult Note (Signed)
NAME:  Karen Barron               ACCOUNT NO.:  [id number]   MEDICAL RECORD NO.:  [id number]          PATIENT TYPE:  REC   LOCATION:  FOOT                         FACILITY:  [facility]   PHYSICIAN:  Jake Shark A. Tanda Rockers, M.D.DATE OF BIRTH:  19-Dec-1925   DATE OF CONSULTATION:  01/31/2007  DATE OF DISCHARGE:                                 CONSULTATION   REASON FOR CONSULTATION:  The patient is referred by Dr. Marga Melnick  for evaluation of persistent painful swollen lower extremities.   IMPRESSION:  Probable severe stasis.   RECOMMENDATIONS:  We are proceeding with a priority duplex scan to rule  out a possibility of popliteal vein thrombosis, and we will treat the  patient initially with compression wraps to control her edema.  Her  duplex scan will be ordered priority.  She will need a followup  consultation for the results and to prescribe definitive treatment.   SUBJECTIVE:  Karen Barron is a 77 year old female who recently had an  extended excursion to [place].  During  that time she developed progressive swelling and pain in her lower  extremities.  These symptoms have become worse over the past several  days.  She notes that her legs are markedly edematous in the evening but  they are moderately normal in size in the morning.  She has had no  fever.  No extreme shortness of breath and no chest pain.  Her past  medical history is remarkable for hypertension, asthma, and questionable  of multiple myeloma and unexplained anemia.  She is allergic to  INDOMETHACIN, ALEVE, GUAIFENESIN, and CELEBREX.  Her past surgeries  included a left shoulder replacement in 2007, bilateral knee  replacements in 1996 and 1997, and appendectomy and a tonsillectomy.   Current medication list includes:  1. __________  600 mg daily.  2. Nexium 20 mg daily.  3. Synthroid 75 mcg daily.  4. Zocor 20 mg q.h.s.  5. Amitriptyline 10 mg q.h.s.  6. Furosemide 40 mg b.i.d.  7.  Lyrica 75 mg b.i.d.  8. Metformin 500 mg a half a tablet daily.  9. Klor-Con M20 daily.  10.Iron 50 mg tablets twice daily.  11.Vitamin D 100 international units two tablets daily.  12.Fish oil 1000 mg two tablets daily.   FAMILY HISTORY:  Is positive for heart attack, stroke and cancer.  Is  negative for hypertension, negative for diabetes.   SOCIALLY:  She is married.  She lives with her husband in [place].  They both are retired.   REVIEW OF SYSTEMS:  Is positive for moderate exercise intolerance  manifested in shortness of breath.  She also complains of pain in the  back.  She specifically denies chest pain.  She has had no visual  changes or transient paralysis.  Weight has been stable.  There had been  no bowel or bladder complaints.  The remainder of the review of systems  is negative.   PHYSICAL EXAMINATION:  GENERAL:  She is alert, oriented, in good contact  with reality.  She is accompanied by her husband.  VITAL SIGNS:  Blood pressure is 153/62, respirations 20, pulse rate 89,  temperature 97.6, capillary blood glucose 142 mg percent.  HEENT:  Exam is clear.  NECK:  Supple.  Trachea is midline.  Thyroid is nonpalpable.  LUNGS:  Clear.  HEART:  Sounds are distant.  ABDOMEN:  Nontender.  EXTREMITY:  Exam is remarkable for prominent varicosities, both at the  reticular level and also ropy varicosities medially.  The pedal pulse is  3+ bilaterally.  Capillary refill is normal.  The neurologic exam is  remarkable for insensitivity to the [facility] filament.  There are no  frank ulcerations.  There are bilateral edematous changes with pitting  in the posterior soleus area.  There is moderate tenderness toward the  popliteal fossa bilaterally.  No tender cords are palpable.  There is  local warmth at the soleus area posteriorly on both lower extremities.   DISCUSSION:  Karen Barron gives a history of prolonged sitting during an  automobile trip which precipitated  progressive edema and pain.  In spite  of the fact that she has worn minimally compressive hose, she continues  to have symptoms.  We have arranged for her to have a priority duplex  scan and to be reevaluated.  We will treat her provisionally with  compression to regain control of her edema while eliminating the  possibility of occult thrombophlebitis.  We have explained this approach  to the patient and her husband in terms that they seem to understand.  We have given them an opportunity to ask questions.  They seem to be  satisfied with these explanations and wish to proceed as outlined.      Harold A. Tanda Rockers, M.D.  Electronically Signed     HAN/MEDQ  D:  01/31/2007  T:  01/31/2007  Job:  [id number]   cc:   Titus Dubin. Alwyn Ren, MD,FACP,FCCP

## 2011-02-24 NOTE — H&P (Signed)
NAME:  Karen Barron, Karen Barron               ACCOUNT NO.:  [id number]   MEDICAL RECORD NO.:  [id number]          PATIENT TYPE:  INP   LOCATION:  6533                         FACILITY:  [facility]   PHYSICIAN:  Rollene Rotunda, M.D.   DATE OF BIRTH:  [birth year]   DATE OF ADMISSION:  11/10/2005  DATE OF DISCHARGE:                                HISTORY & PHYSICAL   PRIMARY CARE PHYSICIAN:  Dr. Alwyn Ren.   REASON FOR PRESENTATION:  Evaluate patient for chest pain.   HISTORY OF PRESENT ILLNESS:  The patient is a pleasant 77 year old white  female with no prior cardiac history other than work up of chest discomfort  in 2001. At that time she had a catheterization which demonstrated normal  coronaries. She was in her usual state of health. She is quite limited  functionally because of multiple joint pains. She has been noticing some  stuttering chest discomfort that has been sporadic over the past week. It is  a substernal burning. It has been mild, lasting for a few minutes and going  away by itself. It has not been associated with other symptoms and not  radiating. She awoke this morning after a restless night with chest  discomfort. It was about 6 out of 10 in intensity. It was unlike a previous  reflux. It was substernal. It did not radiate to her neck or to her arms. It  persisted and she came to the emergency room. She has been here a good part  of the day with this discomfort. There were no objective findings of  ischemia with negative point of care markers so far. She has had some mild  increased segment depression without old EKGs for comparison at this point.  She finally was started on IV nitroglycerin in the emergency room but has  not had significant improvement with that. It seems like the discomfort has  just been slowly ebbing. She has been a little bit nauseated. She has been a  little bit short of breath. She has had no palpitations, presyncope or  syncope. She denies any PND or  orthopnea.   PAST MEDICAL HISTORY:  1.  Dyslipidemia.  2.  Hypertension.  3.  Multiple myeloma.  4.  Hypothyroidism.  5.  Mild sleep apnea not requiring C-Pap.   PAST SURGICAL HISTORY:  Bilateral knee replacements. Back surgery x3. Left  shoulder surgery. Hysterectomy, appendectomy. Tonsillectomy.   ALLERGIES:  ALEVE - caused heart racing. INDOCIN - caused her to have hot  flashes. GUAIFENESIN - caused her blood pressure to increase.   MEDICATIONS:  1.  Nexium 40 mg daily.  2.  Teveten 600 mg daily.  3.  Zocor 20 mg q.h.s.  4.  Amitriptyline 10 mg daily.  5.  Synthroid 375 mcg daily.  6.  Furosemide 40 mg daily.  7.  Levbid 0.375 mg b.i.d.  8.  Calcium.  9.  Centrum.  10. Fish oil 1000 mg t.i.d.  11. Glucosamine, Chondroitin.  12. Boniva.  13. Lyrica 75 mg b.i.d.  14. Iron.  15. Chromagen.   SOCIAL HISTORY:  The patient is married. She  never smoked cigarettes. She  does not drink alcohol. She has two children and four grandchildren. Her  husband raises goats.   FAMILY HISTORY:  Noncontributory for early coronary artery disease in first  degree relatives.   REVIEW OF SYSTEMS:  As stated in the HPI. Positive for multiple joint pains.  Negative for all other systems.   PHYSICAL EXAMINATION:  GENERAL:  The patient is in no distress.  VITAL SIGNS:  Blood pressure 132/48, heart rate 91 and regular, afebrile.  HEENT:  Eyes unremarkable, pupils are equal, round and reactive to light.  Fundi not visualized. Oral mucosa unremarkable.  NECK:  No jugular venous distension at 45 degrees, carotid upstrokes brisk  and symmetrical, no bruits, no thyromegaly.  LYMPHATICS:  No cervical, axillary, or inguinal adenopathy.  LUNGS:  Clear to auscultation bilaterally.  BACK:  No costovertebral angle tenderness.  CHEST:  Unremarkable.  HEART:  The PMI is not displaced or sustained. S1 and S2 within normal  limits, no S3, no S4, no murmurs.  ABDOMEN:  Obese, positive bowel sounds, normal  in frequency and pitch, no  bruits, rebound, guarding, midline pulsatile mass, no splenomegaly.  SKIN:  No rashes. No nodules.  EXTREMITIES:  Pulses are 2+ bilaterally, no edema, no cyanosis, no clubbing.  NEURO:  Oriented to person, place and time. Cranial nerves II-XII grossly  intact. Motor grossly intact.   EKG sinus rhythm, rate 98, axis within normal limits, intervals within  normal limits, no acute ST, T wave changes.   LABORATORY DATA:  Sodium 140, potassium 4.1, chloride 106, BUN 14,  creatinine 0.9. WBC 6200, hemoglobin 11.7, platelets 215,000, magnesium 2.6.  Point of care markers: myoglobin 338, CK-MB 4.6. troponin less than 0.05  (follow up point of care markers: MB  9.7 with BNP 69.4.   ASSESSMENT AND PLAN:  PROBLEM #1. Chest discomfort. The patient's chest  discomfort has some more recent features. She does have cardiovascular risk  factors. Her second point of care marker now actually does demonstrate a  mildly elevated MB though the troponin is normal. At this point she will be  started on heparin, aspirin, she is on IV nitroglycerin. She will be on beta  blockers. She will be admitted and enzymes will be cycled. She will be on  telemetry with nasal cannula O2 p.r.n. We have discussed the risk, benefits  of stress perfusion study versus cardiac catheterization. I think a pretest  probability is somewhat moderate and both tests would be reasonable. The  patient understands risks and benefits of cardiac catheterization including  death, stroke, heart attack, vascular trauma, groin trauma, allergic  reaction to contrast, renal failure. She prefers this diagnostic test and  again it is reasonable. Will proceed with this electively and more urgently  if she has further problems.  PROBLEM #2. Hypothyroidism. She will continue on medications as listed.  PROBLEM #3. Hypertension. She will continue on her medications as listed with the addition of beta blocker.  PROBLEM #4.  Dyslipidemia, will check a fasting lipid profile.           ______________________________  Rollene Rotunda, M.D.     JH/MEDQ  D:  11/10/2005  T:  11/10/2005  Job:  [id number]   cc:   Titus Dubin. Alwyn Ren, M.D. [facility]  [phone] [place]  [place] [address]

## 2011-02-24 NOTE — Discharge Summary (Signed)
NAME:  Karen Barron, Karen Barron               ACCOUNT NO.:  [id number]   MEDICAL RECORD NO.:  [id number]          PATIENT TYPE:  OIB   LOCATION:  3011                         FACILITY:  [facility]   PHYSICIAN:  Almedia Balls. Ranell Patrick, M.D. DATE OF BIRTH:  Jan 05, 1926   DATE OF ADMISSION:  09/07/2006  DATE OF DISCHARGE:  09/09/2006                               DISCHARGE SUMMARY   ADMISSION DIAGNOSIS:  Left shoulder adhesions and shoulder pain  following shoulder arthroplasty.   DISCHARGE DIAGNOSIS:  Left shoulder adhesions and shoulder pain  following shoulder arthroplasty.   PROCEDURE PERFORMED:  Left shoulder lysis of adhesions and manipulation  performed on September 07, 2006.  This was performed by Dr. Ranell Patrick.   CONSULTING SERVICES:  Physical Therapy and Discharge Planning.   HISTORY OF PRESENT ILLNESS:  Patient is a 77 year old female who is  status post left shoulder arthroplasty for arthritis.  The patient had a  postoperative course that resulted in a frozen shoulder with significant  limitations in range of motion and increased pain.  The patient failed  aggressive therapy and presents now for operative treatment, including  shoulder examination under anesthesia, manipulation under anesthesia,  and lysis of adhesions.  Informed consent was obtained.   For further details on patient's past medical history and physical  examination, please see the medical record.   HOSPITAL COURSE:  The patient was admitted to orthopedics and taken to  surgery where she underwent an open lysis of adhesions for her shoulder,  examination under anesthesia and manipulation under anesthesia.  The  patient had an uncomplicated postoperative course remaining afebrile,  tolerating a regular diet well, and moving her shoulder well with  physical therapy prior to discharge.  She has been arranged for daily  home health physical therapy and was discharged on pain medications,  muscle relaxers, on regular diet.  She will  be following up this coming  week in the office.      Almedia Balls. Ranell Patrick, M.D.  Electronically Signed     SRN/MEDQ  D:  10/17/2006  T:  10/18/2006  Job:  [id number]

## 2011-02-24 NOTE — H&P (Signed)
[facility]  Patient:    Karen Barron, Karen Barron                        MRN: [id number] Adm. Date:  04/18/01 Attending:  Georges Lynch. Darrelyn Hillock, M.D. Dictator:   Dorie Rank, P.A. CC:         Titus Dubin. Alwyn Ren, M.D. [facility]   History and Physical  DATE OF BIRTH:  [birth year]  CHIEF COMPLAINT:  Left shoulder pain.  HISTORY OF PRESENT ILLNESS:  [name] is a pleasant 77 year old white female who has had ongoing left shoulder pain and decreased range of motion.  She was seen in the office a number of times for this by Dr. Darrelyn Hillock.  An MRI was obtained and revealed tendinopathy in the rotator cuff with swelling and some partial thickness tears.  No full thickness tears.  Also revealed osteoarthritis of the glenohumeral joint.  She has had steroid injections to her left shoulder which have only helped minimally.  It was to a point where her range of motion and her pain was interfering with her activities of daily living.  It was felt from diagnostic studies and physical examination she would benefit from undergoing an open decompression and repair of the left rotator cuff.  Risks and benefits as well as the procedure were discussed with the patient by Dr. Darrelyn Hillock and she agreed to proceed.  PAST MEDICAL HISTORY:  In February 2002 the patient had pneumonia which she was hospitalized for.  She has had no problems since that time.  She does have migraine headaches.  She does have a heart murmur which she states her doctor requires her to take prophylactic antibiotics prior to surgery or dental procedures.  She has a history of gastroesophageal reflux disease, hyperlipidemia, asthma that is well controlled with her inhalers, history of osteoarthritis, history of hypothyroidism.  PAST SURGICAL HISTORY:  1951 appendectomy/tonsillectomy, 1962 cesarean section, 1969 hysterectomy, total knee replacement in October 1996 and April 1997, back surgery in 2000, removal of cataracts in  2001.  MEDICATIONS: 1. Nexium 40 mg one p.o. q.d. 2. Levbid 0.375 mg one p.o. q.d. p.r.n. 3. Premarin 0.625 one p.o. q.d. 4. Zocor 10 mg one p.o. q.h.s. 5. Amitriptyline one p.o. q.h.s. 6. Fosamax 70 mg one p.o. q.Wednesday. 7. Synthroid 25 mcg one-half tablet p.o. q.d. 8. Celebrex 200 mg one p.o. q.d. 9. Advair diskus, albuterol.  Patient to bring these with her to the hospital.  ALLERGIES:  INDOCIN causes flushing and shortness of breath, GUAIFENEX causes hypertension.  SOCIAL HISTORY:  The patient is married.  She is right hand dominant.  She is a nondrinker and nonsmoker.  Her family medical doctor is Dr. Marga Melnick.  FAMILY HISTORY:  Mother living, age 83, healthy other than a history of osteoarthritis.  Father deceased age 4, history of leukemia.  REVIEW OF SYSTEMS:  GENERAL:  No fevers, chills, night sweats, or bleeding tendencies.  PULMONARY:  No shortness of breath, productive cough, or hemoptysis.  CARDIOVASCULAR:  No chest pain, angina, orthopnea. GASTROINTESTINAL:  No nausea, vomiting, constipation, melena, or bloody stools.  She does have occasional diarrhea.  CNS:  No blurred or double vision, seizures, headache, paralysis.  GENITOURINARY:  No dysuria, hematuria, discharge.  MUSCULOSKELETAL:  Left shoulder pain as described above in history of present illness.  PHYSICAL EXAMINATION  GENERAL:  A 77 year old well-developed, well-nourished white female accompanied by her husband today.  VITAL SIGNS:  Blood pressure 120/80, respirations 20, pulse  78.  HEENT:  Head is atraumatic, normocephalic.  Oropharynx is clear.  NECK:  Supple.  Negative for carotid bruits bilaterally.  CHEST:  Lungs are clear to auscultation bilaterally.  No wheezes, rhonchi, or rales.  BREASTS:  Not pertinent to present illness.  HEART:  S1, S2, regular in rate and rhythm.  There is a grade 2-3/6 diastolic murmur best heard at the tricuspid area.  ABDOMEN:  Soft, nontender,  positive bowel sounds.  GENITOURINARY:  Not pertinent to present illness.  EXTREMITIES:  She has decreased range of motion with abduction to the left shoulder.  Pulses to the upper extremities are 2+ and symmetrical.  No masses or tumors noted about the shoulder or axillary region.  AC joint intact.  SKIN:  She has some ecchymosis noted to the superior aspect of the left shoulder.  Otherwise, no rashes or lesions appreciated on examination.  LABORATORIES:  Pending at this time.  IMPRESSION: 1. Left rotator cuff tear. 2. History of heart murmur. 3. Migraine headaches. 4. Asthma. 5. Recent pneumonia February 2002. 6. Hyperlipidemia. 7. Gastroesophageal reflux disease.  PLAN:  Patient is scheduled for an open decompression and repair of her left rotator cuff by Dr. Ranee Gosselin.  She will receive ampicillin 2 g preoperatively for prophylaxis with her history of heart murmur. DD:  04/16/01 TD:  04/16/01 Job: [id number]

## 2011-02-24 NOTE — Op Note (Signed)
NAME:  Fusco, Alysiana               ACCOUNT NO.:  [id number]   MEDICAL RECORD NO.:  [id number]          PATIENT TYPE:  INP   LOCATION:  5016                         FACILITY:  [facility]   PHYSICIAN:  Almedia Balls. Ranell Patrick, M.D. DATE OF BIRTH:  1926/07/01   DATE OF PROCEDURE:  02/23/2006  DATE OF DISCHARGE:                                 OPERATIVE REPORT   PREOPERATIVE DIAGNOSIS:  Left shoulder end-stage osteoarthritis.   POSTOPERATIVE DIAGNOSIS:  Left shoulder end-stage osteoarthritis.   PROCEDURE PERFORMED:  Left shoulder hemiarthroplasty using [employer]  System.   ATTENDING SURGEON:  Almedia Balls. Ranell Patrick, MD.   ASSISTANT:  Alphonsa Overall, PA-C.   ANESTHESIA:  General anesthesia plus interscalene block anesthesia used.   ESTIMATED BLOOD LOSS:  Less than 100 ml.   FLUIDS REPLACED:  1500 ml crystalloid.   INSTRUMENT COUNT:  Correct.   COMPLICATIONS:  None.   MEDICATIONS GIVEN:  Perioperative antibiotics given.   INDICATIONS:  The patient is a 77 year old female with a history of  worsening left shoulder pain.  The patient presented to Orthopedics with  advanced stages of arthritis including extremely limited range of motion  with crepitance and loss of cartilage on MRI scan.  The patient presents now  for operative treatment having failed conservative management.  Informed  consent was obtained.   DESCRIPTION OF PROCEDURE:  After an adequate level of anesthesia was  achieved, the patient was positioned in the modified beach chair position,  all neurovascular structures padded appropriately.  The left shoulder was  sterilely prepped and draped in the usual manner.  We had preoperatively  obtained a range of motion.  External rotation was about 40, internal  rotation 20 degrees with the arm abducted, forward elevation is 110 degrees,  abduction is 70 degrees.  After a sterile prep and drape, we made an  incision that incorporated some of her prior incision by basically the  deltopectoral approach starting near the coracoid process and extending down  to the anterior deltoid insertion on the humerus.  Dissection was carried  down through the subcutaneous tissues, the cephalic vein identified and  taken laterally with the deltoid and the pectoralis taken medially.  We  released the upper portion of the pec, about a centimeter of it, and then  went ahead and divided the clavipectoral fascia and elevated the conjoined  tendon that revealed a subscapularis.  The patient did have good motion and,  thus, we went ahead and ellipsed a cup of soft tissue at the subscap in  place, and so we made our incision in the subscapularis about a centimeter  medial to the bicipital groove.  We went ahead and developed a subscapularis  interval and took the subscap off the capsule, also released it off the  coracoid process and got it to where it would bounce nicely, went ahead and  placed the traction sutures for controlling subscapular and those were #1  Ethibond.  At this point, we removed a little bit of anterior capsule to  better visualize the joint.  The patient had an extremely arthritic humeral  side, but  the glenoid looked actually in good condition with a nice, smooth,  articular cartilage and a nice concaved fossa.  At this point, we went ahead  and performed our osteotomy.  We externally rotated the arm about 15 to 20  degrees and protected the rotator cuff.  We also protected the axillary  nerve inferiorly, which we could palpate, and made our osteotomy with an  oscillating saw off of a neck-cut guide.  Once this was done, we went ahead  and sequentially prepared with first reamers and then broaches up to a size  8 global stem and then trialed with a 44 x 15 and also 44 x 18 trial.  We  felt like we were probably a little tight with the 18 and perfect with the  15, and there was some eccentricity to the remaining proximal humeral bone  and, thus, we thought we might  use an eccentric head as well for coverage.  At this time, we went ahead and removed the trial components, removed the  remaining osteophytes around the proximal humerus, checked again the glenoid  to make sure that it was in good condition.  There was some slight  retroversion noted on CT scan and we really could not appreciate that open.  There was no significant posterior capsular contracture.  We did release the  posterior capsule off the posterior glenoid neck just to make sure there was  no abnormal pathologic __________  posteriorly that would be influencing the  positioning of our humeral component within the glenoid process.  Once we  were certain that there were no more loose debris or loose bodies present or  osteophytes, we went ahead and compacted the real global stem in place.  This was a size 8 stem.  We bone grafted in the metaphysis as we had the  stem halfway down to make sure we impacted bone and had outstanding fit with  the implant, retrialed with the 15, and actually we could use an 18, which  was gratuitous because we needed the 18 thickness on the 44 implant to get  [place] the eccentric head.  We did trial the eccentric head and it gave [place]  outstanding coverage and went ahead and selected the eccentric head for real  and, in fact, then placed with great coverage, excellent tension on the  rotator cuff, we could translate the head 50% of the diameter of the  glenoid, and as well just inferiorly maybe a quarter of the diameter.  Once  that was done, we went ahead and thoroughly irrigated the wound.  I had  great range of motion with no impingement and nice stability.  We did during  the surgery perform a biceps tenotomy primarily because of the extent of  labral tearing that we noted.  There was a significant posterior labral  tear, which basically had the labrum completely freed up from the posterior capsule.  She seemed to have a fairly stable superior and anterior  labrum  but really degenerative and torn labrum inferiorly so we pretty much did a  labral excision from the 7 o'clock position on this left shoulder inferiorly  around posteriorly through the 3 o'clock position all the way up into the  11:30 position.  Thus, the biceps was tenotomized when we had removed the  excess portion of the biceps and let that retract.  We just felt like that  would be a source of potential pain in the shoulder and did not want that  to  be remaining, and her cuff was in good condition.  Thus, we did not feel we  needed the biceps for a head depression.  At this point, we went ahead and  concluded surgery with closing her subscap back to itself after the repair  with a #2 FiberWire suture in a modified Mason-Allen suture technique into  bone through bone tunnels.  We oversewed with #1 Vicryl suture of figure-of-  eight and including some of the rotator interval as well out laterally just  to give [place] reinforcement, had a nice __________ repair, nice and strong,  could externally rotate out easily to 45 degrees with no tension on the  repair site.  At this point, we went ahead and thoroughly irrigated again  and closed the deltopectoral interval with #0 Vicryl suture followed by #2-0  Vicryl subcutaneous and #4-0 Monocryl for the skin.  Steri-Strips were  applied.           ______________________________  Almedia Balls Ranell Patrick, M.D.     SRN/MEDQ  D:  02/23/2006  T:  02/24/2006  Job:  [id number]

## 2011-02-24 NOTE — Op Note (Signed)
NAME:  Karen Barron, Karen Barron               ACCOUNT NO.:  [id number]   MEDICAL RECORD NO.:  [id number]          PATIENT TYPE:  OIB   LOCATION:  3011                         FACILITY:  [facility]   PHYSICIAN:  Almedia Balls. Ranell Patrick, M.D. DATE OF BIRTH:  1926/04/28   DATE OF PROCEDURE:  09/07/2006  DATE OF DISCHARGE:  09/09/2006                               OPERATIVE REPORT   PREOPERATIVE DIAGNOSIS:  Left shoulder stiffness following specific  hemiarthroplasty for arthritis.   POSTOPERATIVE DIAGNOSIS:  Left shoulder stiffness following specific  hemiarthroplasty for arthritis.   PROCEDURE PERFORMED:  Left shoulder lysis of adhesions, open.   ATTENDING SURGEON:  Almedia Balls. Ranell Patrick, M.D.   ASSISTANT:  Donnie Coffin. Dixon, P.A.-C.   ANESTHESIA:  General anesthesia, plus interscalene block anesthesia  used.   ESTIMATED BLOOD LOSS:  Minimal.   FLUID REPLACEMENT:  1000 cc crystalloid.   INSTRUMENT COUNT:  Correct.   COMPLICATIONS:  There were no complications.   PERIOPERATIVE ANTIBIOTICS:  Given.   INDICATIONS:  The patient is a 77 year old female, who presents for a  stiff left shoulder following her shoulder arthroplasty.  The patient  has failed to progress with therapy and now presents for lysis of  adhesions/manipulation.  Informed consent was obtained.   DESCRIPTION OF PROCEDURE:  After an adequate level of anesthesia was  achieved, the patient was positioned in modified beach-chair position  and the left shoulder was sterilely prepped and draped in the usual  manner.  The shoulder's forward elevation was about 60 degrees.  Her  abduction was 30 degrees.  External rotation was -10, and internal  rotation about 20.  Following sterile prep and drape of the left  shoulder, we went ahead and approached the shoulder through her  deltopectoral incision, and went ahead and performed an aggressive lysis  of adhesions, freeing up the subscapularis, freeing up the subdeltoid  interval.  The patient had  tremendous scarring in the subdeltoid  interval, and following release of her scar, had a major improvement in  her range of motion.  We had external  rotation out to about 45 degrees, internal rotation to her abdomen  easily, and forward elevation to about 140.  We went ahead and  thoroughly irrigated and then closed using a layered closure, and placed  the patient in a shoulder sling after the procedure.      Almedia Balls. Ranell Patrick, M.D.  Electronically Signed     SRN/MEDQ  D:  09/18/2006  T:  09/19/2006  Job:  [id number]

## 2011-03-13 ENCOUNTER — Encounter: Payer: Self-pay | Admitting: Family Medicine

## 2011-03-13 ENCOUNTER — Ambulatory Visit (INDEPENDENT_AMBULATORY_CARE_PROVIDER_SITE_OTHER): Payer: Medicare Other | Admitting: Family Medicine

## 2011-03-13 VITALS — BP 100/50 | HR 94 | Temp 98.5°F | Wt 182.0 lb

## 2011-03-13 DIAGNOSIS — J40 Bronchitis, not specified as acute or chronic: Secondary | ICD-10-CM | POA: Insufficient documentation

## 2011-03-13 MED ORDER — LEVOTHYROXINE SODIUM 75 MCG PO TABS: 75.0000 ug | ORAL_TABLET | Freq: Every day | ORAL | Status: DC

## 2011-03-13 MED ORDER — POTASSIUM CHLORIDE CRYS ER 20 MEQ PO TBCR: 20.0000 meq | EXTENDED_RELEASE_TABLET | Freq: Two times a day (BID) | ORAL | Status: DC

## 2011-03-13 MED ORDER — AZITHROMYCIN 250 MG PO TABS: 250.0000 mg | ORAL_TABLET | Freq: Every day | ORAL | Status: AC

## 2011-03-13 MED ORDER — AMITRIPTYLINE HCL 10 MG PO TABS: 20.0000 mg | ORAL_TABLET | Freq: Every day | ORAL | Status: DC

## 2011-03-13 MED ORDER — BENZONATATE 200 MG PO CAPS: 200.0000 mg | ORAL_CAPSULE | Freq: Three times a day (TID) | ORAL | Status: AC | PRN

## 2011-03-13 MED ORDER — SIMVASTATIN 20 MG PO TABS: 20.0000 mg | ORAL_TABLET | Freq: Every day | ORAL | Status: DC

## 2011-03-13 NOTE — Patient Instructions (Signed)
We will treat this as an early bronchitis to prevent it from worsening Take the Zpack as directed Use the cough pills as needed Mucinex to thin your congestion Drink plenty of fluids Tylenol as needed for pain or fever Call with any questions or concerns Hang in there!!!

## 2011-03-13 NOTE — Progress Notes (Signed)
Pt was made aware that appt is needed with Dr. Alwyn Ren and refills were sent to [facility] upon request.

## 2011-03-13 NOTE — Progress Notes (Signed)
  Subjective:    Patient ID: Karen Barron, female    DOB: [birth year], 77 y.o.   MRN: [id number]  HPI Cough- developed sore throat yesterday evening.  Woke at midnight coughing- cough was productive of green sputum.  Nasal congestion is also green.  'family told me i had to see the doctor b/c i often get into trouble w/ colds so they told me to get abx'.  No fevers.  Mild HA but denies facial pain or pressure.  No ear pain.  + sick contacts.  'just feeling bad'.  Pt is a cancer pt  Review of Systems For ROS see HPI     Objective:   Physical Exam  Constitutional: She appears well-developed and well-nourished. No distress.  HENT:  Head: Normocephalic and atraumatic.       TMs normal bilaterally Mild nasal congestion Throat w/out erythema, edema, or exudate  Eyes: Conjunctivae and EOM are normal. Pupils are equal, round, and reactive to light.  Neck: Normal range of motion. Neck supple.  Cardiovascular: Normal rate, regular rhythm, normal heart sounds and intact distal pulses.   No murmur heard. Pulmonary/Chest: Effort normal and breath sounds normal. No respiratory distress. She has no wheezes.       + hacking cough  Lymphadenopathy:    She has no cervical adenopathy.          Assessment & Plan:

## 2011-03-13 NOTE — Assessment & Plan Note (Signed)
Pt's sxs are most likely viral but given that she is a cancer pt and family is concerned will start abx.  Cough meds prn.  Reviewed supportive care and red flags that should prompt return.  Pt expressed understanding and is in agreement w/ plan.

## 2011-03-27 ENCOUNTER — Other Ambulatory Visit: Payer: Self-pay | Admitting: Internal Medicine

## 2011-03-27 DIAGNOSIS — E119 Type 2 diabetes mellitus without complications: Secondary | ICD-10-CM

## 2011-03-28 ENCOUNTER — Other Ambulatory Visit (INDEPENDENT_AMBULATORY_CARE_PROVIDER_SITE_OTHER): Payer: Medicare Other

## 2011-03-28 DIAGNOSIS — E119 Type 2 diabetes mellitus without complications: Secondary | ICD-10-CM

## 2011-03-28 NOTE — Progress Notes (Signed)
Labs only

## 2011-03-30 ENCOUNTER — Telehealth: Payer: Self-pay | Admitting: Cardiology

## 2011-03-30 MED ORDER — METOPROLOL SUCCINATE ER 50 MG PO TB24: 50.0000 mg | ORAL_TABLET | Freq: Every day | ORAL | Status: DC

## 2011-03-30 NOTE — Telephone Encounter (Signed)
rx sent into pharmacy. LMOM

## 2011-03-30 NOTE — Telephone Encounter (Signed)
[facility] refill needed for metoprolol 50 mg

## 2011-04-04 ENCOUNTER — Encounter (HOSPITAL_BASED_OUTPATIENT_CLINIC_OR_DEPARTMENT_OTHER): Payer: Medicare Other | Admitting: Oncology

## 2011-04-04 ENCOUNTER — Other Ambulatory Visit: Payer: Self-pay | Admitting: Oncology

## 2011-04-04 DIAGNOSIS — C9000 Multiple myeloma not having achieved remission: Secondary | ICD-10-CM

## 2011-04-04 DIAGNOSIS — Z5112 Encounter for antineoplastic immunotherapy: Secondary | ICD-10-CM

## 2011-04-04 LAB — CBC WITH DIFFERENTIAL/PLATELET
Basophils Absolute: 0 10*3/uL (ref 0.0–0.1)
Eosinophils Absolute: 0.1 10*3/uL (ref 0.0–0.5)
HCT: 29.5 % — ABNORMAL LOW (ref 34.8–46.6)
HGB: 10.3 g/dL — ABNORMAL LOW (ref 11.6–15.9)
LYMPH%: 27.6 % (ref 14.0–49.7)
MONO#: 0.4 10*3/uL (ref 0.1–0.9)
NEUT#: 1.6 10*3/uL (ref 1.5–6.5)
Platelets: 116 10*3/uL — ABNORMAL LOW (ref 145–400)
RBC: 2.96 10*6/uL — ABNORMAL LOW (ref 3.70–5.45)
WBC: 2.9 10*3/uL — ABNORMAL LOW (ref 3.9–10.3)

## 2011-04-06 LAB — PROTEIN ELECTROPHORESIS, SERUM
Albumin ELP: 55.7 % — ABNORMAL LOW (ref 55.8–66.1)
Beta 2: 3.3 % (ref 3.2–6.5)
Beta Globulin: 5.2 % (ref 4.7–7.2)
M-Spike, %: 1.28 g/dL
Total Protein, Serum Electrophoresis: 7.2 g/dL (ref 6.0–8.3)

## 2011-04-06 LAB — BASIC METABOLIC PANEL
BUN: 15 mg/dL (ref 6–23)
Calcium: 9 mg/dL (ref 8.4–10.5)
Chloride: 102 mEq/L (ref 96–112)
Creatinine, Ser: 0.93 mg/dL (ref 0.50–1.10)

## 2011-05-02 ENCOUNTER — Other Ambulatory Visit: Payer: Self-pay | Admitting: Oncology

## 2011-05-02 ENCOUNTER — Encounter (HOSPITAL_BASED_OUTPATIENT_CLINIC_OR_DEPARTMENT_OTHER): Payer: Medicare Other | Admitting: Oncology

## 2011-05-02 DIAGNOSIS — D638 Anemia in other chronic diseases classified elsewhere: Secondary | ICD-10-CM

## 2011-05-02 DIAGNOSIS — C9000 Multiple myeloma not having achieved remission: Secondary | ICD-10-CM

## 2011-05-02 LAB — BASIC METABOLIC PANEL
BUN: 21 mg/dL (ref 6–23)
Chloride: 100 mEq/L (ref 96–112)
Potassium: 3.6 mEq/L (ref 3.5–5.3)

## 2011-05-02 LAB — CBC WITH DIFFERENTIAL/PLATELET
BASO%: 0.3 % (ref 0.0–2.0)
EOS%: 1.3 % (ref 0.0–7.0)
MCH: 33.7 pg (ref 25.1–34.0)
MCV: 100 fL (ref 79.5–101.0)
MONO%: 12.9 % (ref 0.0–14.0)
RBC: 2.82 10*6/uL — ABNORMAL LOW (ref 3.70–5.45)
RDW: 12.7 % (ref 11.2–14.5)
lymph#: 1.3 10*3/uL (ref 0.9–3.3)
nRBC: 0 % (ref 0–0)

## 2011-05-05 LAB — PROTEIN ELECTROPHORESIS, SERUM
Alpha-2-Globulin: 9.2 % (ref 7.1–11.8)
Beta Globulin: 4.7 % (ref 4.7–7.2)
M-Spike, %: 1.98 g/dL
Total Protein, Serum Electrophoresis: 7.9 g/dL (ref 6.0–8.3)

## 2011-05-25 ENCOUNTER — Other Ambulatory Visit: Payer: Self-pay | Admitting: Oncology

## 2011-05-25 ENCOUNTER — Encounter (HOSPITAL_BASED_OUTPATIENT_CLINIC_OR_DEPARTMENT_OTHER): Payer: Medicare Other | Admitting: Oncology

## 2011-05-25 ENCOUNTER — Encounter (HOSPITAL_COMMUNITY)
Admission: RE | Admit: 2011-05-25 | Discharge: 2011-05-25 | Disposition: A | Discharge status: Home / Self Care | Payer: Medicare Other | Source: Ambulatory Visit | Attending: Oncology | Admitting: Oncology

## 2011-05-25 DIAGNOSIS — Z5111 Encounter for antineoplastic chemotherapy: Secondary | ICD-10-CM

## 2011-05-25 DIAGNOSIS — D649 Anemia, unspecified: Secondary | ICD-10-CM | POA: Insufficient documentation

## 2011-05-25 DIAGNOSIS — R5383 Other fatigue: Secondary | ICD-10-CM

## 2011-05-25 DIAGNOSIS — R0609 Other forms of dyspnea: Secondary | ICD-10-CM

## 2011-05-25 DIAGNOSIS — C9000 Multiple myeloma not having achieved remission: Secondary | ICD-10-CM

## 2011-05-25 LAB — COMPREHENSIVE METABOLIC PANEL
Albumin: 3.4 g/dL — ABNORMAL LOW (ref 3.5–5.2)
CO2: 27 mEq/L (ref 19–32)
Glucose, Bld: 74 mg/dL (ref 70–99)
Potassium: 4.1 mEq/L (ref 3.5–5.3)
Sodium: 138 mEq/L (ref 135–145)
Total Protein: 9.5 g/dL — ABNORMAL HIGH (ref 6.0–8.3)

## 2011-05-25 LAB — CBC WITH DIFFERENTIAL/PLATELET
BASO%: 0.5 % (ref 0.0–2.0)
EOS%: 2.4 % (ref 0.0–7.0)
HCT: 24.3 % — ABNORMAL LOW (ref 34.8–46.6)
LYMPH%: 27.3 % (ref 14.0–49.7)
MCH: 33.9 pg (ref 25.1–34.0)
MCHC: 32.1 g/dL (ref 31.5–36.0)
NEUT%: 52.2 % (ref 38.4–76.8)
lymph#: 1.7 10*3/uL (ref 0.9–3.3)

## 2011-05-25 LAB — TYPE & CROSSMATCH - CHCC

## 2011-05-26 ENCOUNTER — Other Ambulatory Visit: Payer: Self-pay | Admitting: Oncology

## 2011-05-26 ENCOUNTER — Ambulatory Visit (HOSPITAL_COMMUNITY)
Admission: RE | Admit: 2011-05-26 | Discharge: 2011-05-26 | Disposition: A | Discharge status: Home / Self Care | Payer: Medicare Other | Source: Ambulatory Visit | Attending: Oncology | Admitting: Oncology

## 2011-05-26 ENCOUNTER — Encounter (HOSPITAL_BASED_OUTPATIENT_CLINIC_OR_DEPARTMENT_OTHER): Payer: Medicare Other | Admitting: Oncology

## 2011-05-26 ENCOUNTER — Inpatient Hospital Stay (HOSPITAL_COMMUNITY)
Admission: AD | Admit: 2011-05-26 | Discharge: 2011-05-31 | DRG: 292 | Disposition: A | Discharge status: Home / Self Care | Payer: Medicare Other | Source: Ambulatory Visit | Attending: Oncology | Admitting: Oncology

## 2011-05-26 DIAGNOSIS — R0600 Dyspnea, unspecified: Secondary | ICD-10-CM

## 2011-05-26 DIAGNOSIS — G8929 Other chronic pain: Secondary | ICD-10-CM | POA: Diagnosis present

## 2011-05-26 DIAGNOSIS — N289 Disorder of kidney and ureter, unspecified: Secondary | ICD-10-CM | POA: Diagnosis present

## 2011-05-26 DIAGNOSIS — R0902 Hypoxemia: Secondary | ICD-10-CM | POA: Diagnosis present

## 2011-05-26 DIAGNOSIS — Z79899 Other long term (current) drug therapy: Secondary | ICD-10-CM

## 2011-05-26 DIAGNOSIS — I509 Heart failure, unspecified: Secondary | ICD-10-CM | POA: Diagnosis present

## 2011-05-26 DIAGNOSIS — C9000 Multiple myeloma not having achieved remission: Secondary | ICD-10-CM | POA: Diagnosis present

## 2011-05-26 DIAGNOSIS — D630 Anemia in neoplastic disease: Secondary | ICD-10-CM | POA: Diagnosis present

## 2011-05-26 DIAGNOSIS — D696 Thrombocytopenia, unspecified: Secondary | ICD-10-CM

## 2011-05-26 DIAGNOSIS — I2789 Other specified pulmonary heart diseases: Secondary | ICD-10-CM | POA: Diagnosis present

## 2011-05-26 DIAGNOSIS — B029 Zoster without complications: Secondary | ICD-10-CM | POA: Diagnosis present

## 2011-05-26 DIAGNOSIS — M549 Dorsalgia, unspecified: Secondary | ICD-10-CM | POA: Diagnosis present

## 2011-05-26 DIAGNOSIS — G609 Hereditary and idiopathic neuropathy, unspecified: Secondary | ICD-10-CM | POA: Diagnosis present

## 2011-05-26 DIAGNOSIS — D649 Anemia, unspecified: Secondary | ICD-10-CM

## 2011-05-26 DIAGNOSIS — I5033 Acute on chronic diastolic (congestive) heart failure: Principal | ICD-10-CM | POA: Diagnosis present

## 2011-05-26 DIAGNOSIS — D6959 Other secondary thrombocytopenia: Secondary | ICD-10-CM | POA: Diagnosis present

## 2011-05-26 LAB — CK TOTAL AND CKMB (NOT AT ARMC): Relative Index: INVALID (ref 0.0–2.5)

## 2011-05-26 LAB — TROPONIN I: Troponin I: <0.3 ng/mL (ref <0.30)

## 2011-05-27 DIAGNOSIS — C9000 Multiple myeloma not having achieved remission: Secondary | ICD-10-CM

## 2011-05-27 DIAGNOSIS — D696 Thrombocytopenia, unspecified: Secondary | ICD-10-CM

## 2011-05-27 DIAGNOSIS — D649 Anemia, unspecified: Secondary | ICD-10-CM

## 2011-05-27 DIAGNOSIS — M79609 Pain in unspecified limb: Secondary | ICD-10-CM

## 2011-05-27 LAB — BASIC METABOLIC PANEL
Chloride: 99 mEq/L (ref 96–112)
Creatinine, Ser: 1.81 mg/dL — ABNORMAL HIGH (ref 0.50–1.10)
GFR calc Af Amer: 32 mL/min — ABNORMAL LOW (ref >60)
Potassium: 4.4 mEq/L (ref 3.5–5.1)
Sodium: 135 mEq/L (ref 135–145)

## 2011-05-27 LAB — CROSSMATCH
ABO/RH(D): A POS
Unit division: 0

## 2011-05-27 LAB — CBC
Hemoglobin: 8.7 g/dL — ABNORMAL LOW (ref 12.0–15.0)
MCH: 33.9 pg (ref 26.0–34.0)
MCV: 100 fL (ref 78.0–100.0)
Platelets: 62 10*3/uL — ABNORMAL LOW (ref 150–400)
RBC: 2.57 MIL/uL — ABNORMAL LOW (ref 3.87–5.11)
WBC: 7.2 10*3/uL (ref 4.0–10.5)

## 2011-05-28 DIAGNOSIS — C9000 Multiple myeloma not having achieved remission: Secondary | ICD-10-CM

## 2011-05-28 LAB — BASIC METABOLIC PANEL
CO2: 25 mEq/L (ref 19–32)
Chloride: 101 mEq/L (ref 96–112)
Creatinine, Ser: 1.71 mg/dL — ABNORMAL HIGH (ref 0.50–1.10)
Glucose, Bld: 121 mg/dL — ABNORMAL HIGH (ref 70–99)
Sodium: 136 mEq/L (ref 135–145)

## 2011-05-28 LAB — CBC
Hemoglobin: 7.9 g/dL — ABNORMAL LOW (ref 12.0–15.0)
MCV: 102.1 fL — ABNORMAL HIGH (ref 78.0–100.0)
Platelets: 56 10*3/uL — ABNORMAL LOW (ref 150–400)
RBC: 2.35 MIL/uL — ABNORMAL LOW (ref 3.87–5.11)
WBC: 5.4 10*3/uL (ref 4.0–10.5)

## 2011-05-29 DIAGNOSIS — D696 Thrombocytopenia, unspecified: Secondary | ICD-10-CM

## 2011-05-29 DIAGNOSIS — C9000 Multiple myeloma not having achieved remission: Secondary | ICD-10-CM

## 2011-05-29 DIAGNOSIS — D649 Anemia, unspecified: Secondary | ICD-10-CM

## 2011-05-29 DIAGNOSIS — I5033 Acute on chronic diastolic (congestive) heart failure: Secondary | ICD-10-CM

## 2011-05-29 LAB — CBC
HCT: 24.1 % — ABNORMAL LOW (ref 36.0–46.0)
MCH: 33.5 pg (ref 26.0–34.0)
MCV: 103.4 fL — ABNORMAL HIGH (ref 78.0–100.0)
RBC: 2.33 MIL/uL — ABNORMAL LOW (ref 3.87–5.11)
WBC: 4.3 10*3/uL (ref 4.0–10.5)

## 2011-05-29 LAB — PROTEIN ELECTROPHORESIS, SERUM
Albumin ELP: 43.5 % — ABNORMAL LOW (ref 55.8–66.1)
Beta 2: 3.4 % (ref 3.2–6.5)
Total Protein, Serum Electrophoresis: 9.1 g/dL — ABNORMAL HIGH (ref 6.0–8.3)

## 2011-05-29 LAB — IGM: IgM, Serum: 4530 mg/dL — ABNORMAL HIGH (ref 52–322)

## 2011-05-29 LAB — BASIC METABOLIC PANEL
BUN: 32 mg/dL — ABNORMAL HIGH (ref 6–23)
CO2: 26 mEq/L (ref 19–32)
Chloride: 101 mEq/L (ref 96–112)
Glucose, Bld: 112 mg/dL — ABNORMAL HIGH (ref 70–99)
Potassium: 4 mEq/L (ref 3.5–5.1)

## 2011-05-30 ENCOUNTER — Encounter: Payer: Self-pay | Admitting: Cardiology

## 2011-05-30 LAB — CBC
Hemoglobin: 8.5 g/dL — ABNORMAL LOW (ref 12.0–15.0)
MCHC: 32.6 g/dL (ref 30.0–36.0)
WBC: 4.1 10*3/uL (ref 4.0–10.5)

## 2011-05-30 LAB — BASIC METABOLIC PANEL
GFR calc Af Amer: 45 mL/min — ABNORMAL LOW (ref >60)
GFR calc non Af Amer: 37 mL/min — ABNORMAL LOW (ref >60)
Glucose, Bld: 108 mg/dL — ABNORMAL HIGH (ref 70–99)
Potassium: 3.9 mEq/L (ref 3.5–5.1)
Sodium: 137 mEq/L (ref 135–145)

## 2011-05-31 LAB — BASIC METABOLIC PANEL
BUN: 27 mg/dL — ABNORMAL HIGH (ref 6–23)
Chloride: 96 mEq/L (ref 96–112)
GFR calc Af Amer: 49 mL/min — ABNORMAL LOW (ref >60)
Potassium: 3.6 mEq/L (ref 3.5–5.1)

## 2011-06-01 ENCOUNTER — Other Ambulatory Visit: Payer: Self-pay | Admitting: Oncology

## 2011-06-01 ENCOUNTER — Encounter (HOSPITAL_BASED_OUTPATIENT_CLINIC_OR_DEPARTMENT_OTHER): Payer: Medicare Other | Admitting: Oncology

## 2011-06-01 DIAGNOSIS — C9000 Multiple myeloma not having achieved remission: Secondary | ICD-10-CM

## 2011-06-01 DIAGNOSIS — Z5111 Encounter for antineoplastic chemotherapy: Secondary | ICD-10-CM

## 2011-06-01 LAB — CROSSMATCH: Unit division: 0

## 2011-06-01 LAB — BASIC METABOLIC PANEL
BUN: 28 mg/dL — ABNORMAL HIGH (ref 6–23)
Chloride: 97 mEq/L (ref 96–112)
Glucose, Bld: 216 mg/dL — ABNORMAL HIGH (ref 70–99)
Potassium: 3.6 mEq/L (ref 3.5–5.3)

## 2011-06-01 LAB — CBC WITH DIFFERENTIAL/PLATELET
Basophils Absolute: 0 10*3/uL (ref 0.0–0.1)
HCT: 28 % — ABNORMAL LOW (ref 34.8–46.6)
HGB: 9.8 g/dL — ABNORMAL LOW (ref 11.6–15.9)
MONO#: 0.6 10*3/uL (ref 0.1–0.9)
NEUT%: 52.5 % (ref 38.4–76.8)
WBC: 3.9 10*3/uL (ref 3.9–10.3)
lymph#: 1.1 10*3/uL (ref 0.9–3.3)

## 2011-06-02 ENCOUNTER — Encounter: Payer: Medicare Other | Admitting: Oncology

## 2011-06-02 ENCOUNTER — Inpatient Hospital Stay (HOSPITAL_COMMUNITY)
Admission: EM | Admit: 2011-06-02 | Discharge: 2011-06-03 | DRG: 812 | Disposition: A | Discharge status: Home / Self Care | Payer: Medicare Other | Source: Ambulatory Visit | Attending: Internal Medicine | Admitting: Internal Medicine

## 2011-06-02 ENCOUNTER — Emergency Department (HOSPITAL_COMMUNITY): Payer: Medicare Other

## 2011-06-02 DIAGNOSIS — E039 Hypothyroidism, unspecified: Secondary | ICD-10-CM | POA: Diagnosis present

## 2011-06-02 DIAGNOSIS — D630 Anemia in neoplastic disease: Principal | ICD-10-CM | POA: Diagnosis present

## 2011-06-02 DIAGNOSIS — I10 Essential (primary) hypertension: Secondary | ICD-10-CM | POA: Diagnosis present

## 2011-06-02 DIAGNOSIS — M549 Dorsalgia, unspecified: Secondary | ICD-10-CM | POA: Diagnosis present

## 2011-06-02 DIAGNOSIS — I5032 Chronic diastolic (congestive) heart failure: Secondary | ICD-10-CM | POA: Diagnosis present

## 2011-06-02 DIAGNOSIS — J45909 Unspecified asthma, uncomplicated: Secondary | ICD-10-CM | POA: Diagnosis present

## 2011-06-02 DIAGNOSIS — I509 Heart failure, unspecified: Secondary | ICD-10-CM | POA: Diagnosis present

## 2011-06-02 DIAGNOSIS — Z79899 Other long term (current) drug therapy: Secondary | ICD-10-CM

## 2011-06-02 DIAGNOSIS — G8929 Other chronic pain: Secondary | ICD-10-CM | POA: Diagnosis present

## 2011-06-02 DIAGNOSIS — D6959 Other secondary thrombocytopenia: Secondary | ICD-10-CM | POA: Diagnosis present

## 2011-06-02 DIAGNOSIS — G609 Hereditary and idiopathic neuropathy, unspecified: Secondary | ICD-10-CM | POA: Diagnosis present

## 2011-06-02 DIAGNOSIS — N390 Urinary tract infection, site not specified: Secondary | ICD-10-CM | POA: Diagnosis present

## 2011-06-02 DIAGNOSIS — Z96659 Presence of unspecified artificial knee joint: Secondary | ICD-10-CM

## 2011-06-02 DIAGNOSIS — E119 Type 2 diabetes mellitus without complications: Secondary | ICD-10-CM | POA: Diagnosis present

## 2011-06-02 DIAGNOSIS — C9000 Multiple myeloma not having achieved remission: Secondary | ICD-10-CM | POA: Diagnosis present

## 2011-06-02 LAB — BASIC METABOLIC PANEL
BUN: 41 mg/dL — ABNORMAL HIGH (ref 6–23)
CO2: 26 mEq/L (ref 19–32)
Chloride: 99 mEq/L (ref 96–112)
Glucose, Bld: 136 mg/dL — ABNORMAL HIGH (ref 70–99)
Potassium: 4 mEq/L (ref 3.5–5.1)

## 2011-06-02 LAB — CBC
MCV: 99 fL (ref 78.0–100.0)
Platelets: 61 10*3/uL — ABNORMAL LOW (ref 150–400)
RBC: 2.04 MIL/uL — ABNORMAL LOW (ref 3.87–5.11)
WBC: 8 10*3/uL (ref 4.0–10.5)

## 2011-06-02 LAB — URINE MICROSCOPIC-ADD ON

## 2011-06-02 LAB — POCT I-STAT 3, ART BLOOD GAS (G3+)
pCO2 arterial: 35.8 mmHg (ref 35.0–45.0)
pO2, Arterial: 84 mmHg (ref 80.0–100.0)

## 2011-06-02 LAB — URINALYSIS, ROUTINE W REFLEX MICROSCOPIC
Glucose, UA: NEGATIVE mg/dL
Hgb urine dipstick: NEGATIVE
Specific Gravity, Urine: 1.012 (ref 1.005–1.030)

## 2011-06-02 LAB — DIFFERENTIAL
Basophils Absolute: 0 10*3/uL (ref 0.0–0.1)
Lymphocytes Relative: 25 % (ref 12–46)
Monocytes Relative: 23 % — ABNORMAL HIGH (ref 3–12)
Neutro Abs: 4.1 10*3/uL (ref 1.7–7.7)

## 2011-06-02 LAB — D-DIMER, QUANTITATIVE: D-Dimer, Quant: 0.24 ug/mL-FEU (ref 0.00–0.48)

## 2011-06-03 LAB — CROSSMATCH
ABO/RH(D): A POS
Antibody Screen: NEGATIVE
Unit division: 0

## 2011-06-03 LAB — URINE CULTURE
Colony Count: NO GROWTH
Culture: NO GROWTH

## 2011-06-08 ENCOUNTER — Other Ambulatory Visit: Payer: Self-pay | Admitting: Oncology

## 2011-06-08 ENCOUNTER — Encounter (HOSPITAL_BASED_OUTPATIENT_CLINIC_OR_DEPARTMENT_OTHER): Payer: Medicare Other | Admitting: Oncology

## 2011-06-08 DIAGNOSIS — Z5112 Encounter for antineoplastic immunotherapy: Secondary | ICD-10-CM

## 2011-06-08 DIAGNOSIS — C9000 Multiple myeloma not having achieved remission: Secondary | ICD-10-CM

## 2011-06-08 LAB — COMPREHENSIVE METABOLIC PANEL
AST: 18 U/L (ref 0–37)
Albumin: 3.6 g/dL (ref 3.5–5.2)
Alkaline Phosphatase: 40 U/L (ref 39–117)
BUN: 30 mg/dL — ABNORMAL HIGH (ref 6–23)
Glucose, Bld: 85 mg/dL (ref 70–99)
Potassium: 4.3 mEq/L (ref 3.5–5.3)
Sodium: 139 mEq/L (ref 135–145)
Total Bilirubin: 1.4 mg/dL — ABNORMAL HIGH (ref 0.3–1.2)
Total Protein: 10 g/dL — ABNORMAL HIGH (ref 6.0–8.3)

## 2011-06-08 LAB — CBC WITH DIFFERENTIAL/PLATELET
BASO%: 0.6 % (ref 0.0–2.0)
Basophils Absolute: 0 10*3/uL (ref 0.0–0.1)
EOS%: 1.5 % (ref 0.0–7.0)
HGB: 11.5 g/dL — ABNORMAL LOW (ref 11.6–15.9)
MCH: 31.6 pg (ref 25.1–34.0)
RDW: 15.4 % — ABNORMAL HIGH (ref 11.2–14.5)
WBC: 5.2 10*3/uL (ref 3.9–10.3)
lymph#: 2.4 10*3/uL (ref 0.9–3.3)

## 2011-06-08 LAB — TECHNOLOGIST REVIEW

## 2011-06-09 ENCOUNTER — Encounter (HOSPITAL_BASED_OUTPATIENT_CLINIC_OR_DEPARTMENT_OTHER): Payer: Medicare Other | Admitting: Oncology

## 2011-06-09 DIAGNOSIS — Z5112 Encounter for antineoplastic immunotherapy: Secondary | ICD-10-CM

## 2011-06-09 DIAGNOSIS — C9000 Multiple myeloma not having achieved remission: Secondary | ICD-10-CM

## 2011-06-16 ENCOUNTER — Encounter (HOSPITAL_BASED_OUTPATIENT_CLINIC_OR_DEPARTMENT_OTHER): Payer: Medicare Other | Admitting: Oncology

## 2011-06-16 ENCOUNTER — Other Ambulatory Visit: Payer: Self-pay | Admitting: Oncology

## 2011-06-16 DIAGNOSIS — C9000 Multiple myeloma not having achieved remission: Secondary | ICD-10-CM

## 2011-06-16 DIAGNOSIS — D638 Anemia in other chronic diseases classified elsewhere: Secondary | ICD-10-CM

## 2011-06-16 LAB — CBC WITH DIFFERENTIAL/PLATELET
Basophils Absolute: 0 10*3/uL (ref 0.0–0.1)
EOS%: 1.7 % (ref 0.0–7.0)
Eosinophils Absolute: 0.1 10*3/uL (ref 0.0–0.5)
HGB: 10.9 g/dL — ABNORMAL LOW (ref 11.6–15.9)
LYMPH%: 41.9 % (ref 14.0–49.7)
MCH: 33.1 pg (ref 25.1–34.0)
MCV: 95.2 fL (ref 79.5–101.0)
MONO%: 17.4 % — ABNORMAL HIGH (ref 0.0–14.0)
NEUT#: 1.5 10*3/uL (ref 1.5–6.5)
Platelets: 49 10*3/uL — ABNORMAL LOW (ref 145–400)
RDW: 16.2 % — ABNORMAL HIGH (ref 11.2–14.5)

## 2011-06-20 ENCOUNTER — Ambulatory Visit (INDEPENDENT_AMBULATORY_CARE_PROVIDER_SITE_OTHER): Payer: Medicare Other | Admitting: Cardiology

## 2011-06-20 ENCOUNTER — Encounter: Payer: Self-pay | Admitting: Cardiology

## 2011-06-20 VITALS — BP 118/54 | HR 94 | Ht 64.0 in | Wt 174.0 lb

## 2011-06-20 DIAGNOSIS — R609 Edema, unspecified: Secondary | ICD-10-CM

## 2011-06-20 DIAGNOSIS — I509 Heart failure, unspecified: Secondary | ICD-10-CM

## 2011-06-20 DIAGNOSIS — I5032 Chronic diastolic (congestive) heart failure: Secondary | ICD-10-CM

## 2011-06-20 DIAGNOSIS — R0989 Other specified symptoms and signs involving the circulatory and respiratory systems: Secondary | ICD-10-CM

## 2011-06-20 MED ORDER — FUROSEMIDE 40 MG PO TABS: 40.0000 mg | ORAL_TABLET | Freq: Two times a day (BID) | ORAL | Status: DC

## 2011-06-20 MED ORDER — POTASSIUM CHLORIDE CRYS ER 20 MEQ PO TBCR: 20.0000 meq | EXTENDED_RELEASE_TABLET | Freq: Two times a day (BID) | ORAL | Status: DC

## 2011-06-20 NOTE — Assessment & Plan Note (Signed)
Stable. Continue Lasix 40 mg twice a day. He will need intravenous Lasix when she gets transfused. Reinforced with husband and patient. I will see her back in a year unless there are problems with increased edema or weight gain.

## 2011-06-20 NOTE — Progress Notes (Signed)
HPI Karen Barron comes in for close followup posthospitalization for acute on chronic diastolic heart failure. She has active myeloma and is receiving chemotherapy he required multiple transfusions. Her last hemoglobin was over 11 last week.  She has dyspnea on exertion but no orthopnea. She denies any palpitations. Her Lasix has been increased to 40 mg twice a day. He receives IV Lasix which he received transfusions.  Her EKG today shows normal sinus rhythm no acute changes. Past Medical History  Diagnosis Date  . Colon polyps   . Osteoporosis   . Waldenstrom's macroglobulinemia   . Diverticular disease   . Thyroid disease   . Fatty liver   . Hypertension   . Diabetes in pregnancy   . Dyspnea on exertion   . Hyperlipidemia   . Edema   . Spinal stenosis   . Asthma   . Anemia   . Multiple myeloma in remission   . DJD (degenerative joint disease)     Past Surgical History  Procedure Date  . Total abdominal hysterectomy     due to fibroids  . Appendectomy   . Kyphosis surgery   . Cataract extraction   . Colonoscopy w/ polypectomy   . Total knee arthroplasty     bilateral  . Lumbar fusion     Family History  Problem Relation Age of Onset  . Leukemia Father   . Osteoporosis Mother   . Cancer Sister     CNS  . Melanoma Sister     History   Social History  . Marital Status: Married    Spouse Name: N/A    Number of Children: N/A  . Years of Education: N/A   Occupational History  . Not on file.   Social History Main Topics  . Smoking status: Never Smoker   . Smokeless tobacco: Not on file  . Alcohol Use: No  . Drug Use: No  . Sexually Active:    Other Topics Concern  . Not on file   Social History Narrative  . No narrative on file    Allergies  Allergen Reactions  . Codeine   . Indomethacin   . Naproxen Sodium   . Risedronate Sodium     Current Outpatient Prescriptions  Medication Sig Dispense Refill  . acetaminophen (TYLENOL) 650 MG CR tablet  Take 650 mg by mouth every 8 (eight) hours as needed.        Marland Kitchen amitriptyline (ELAVIL) 10 MG tablet Take 2 tablets (20 mg total) by mouth at bedtime.  180 tablet  0  . benzonatate (TESSALON) 100 MG capsule Take 100 mg by mouth 3 (three) times daily as needed.        . cyclophosphamide (CYTOXAN) 500 MG chemo injection Inject into the vein. Take as directed weekly       . diphenoxylate-atropine (LOMOTIL) 2.5-0.025 MG per tablet Take 1 tablet by mouth 4 (four) times daily as needed.        Marland Kitchen esomeprazole (NEXIUM) 20 MG capsule Take 1 capsule (20 mg total) by mouth daily.  90 capsule  2  . furosemide (LASIX) 40 MG tablet Take 40 mg by mouth 2 (two) times daily.        Marland Kitchen HYDROmorphone (DILAUDID) 4 MG tablet Take 4 mg by mouth every 6 (six) hours as needed.        Marland Kitchen levothyroxine (SYNTHROID, LEVOTHROID) 75 MCG tablet Take 1 tablet (75 mcg total) by mouth daily.  90 tablet  0  . metoprolol (TOPROL-XL)  50 MG 24 hr tablet Take 1 tablet (50 mg total) by mouth daily.  90 tablet  3  . potassium chloride SA (K-DUR,KLOR-CON) 20 MEQ tablet Take 1 tablet (20 mEq total) by mouth 2 (two) times daily.  180 tablet  0  . Probiotic Product (ALIGN) 4 MG CAPS Take by mouth daily.        . prochlorperazine (COMPAZINE) 10 MG tablet Take 10 mg by mouth every 6 (six) hours as needed.        . ranitidine (ZANTAC) 150 MG capsule Take 150 mg by mouth as needed.        . simvastatin (ZOCOR) 20 MG tablet Take 1 tablet (20 mg total) by mouth at bedtime.  90 tablet  0    ROS Negative other than HPI.   PE  General Appearance: well developed, well nourished in no acute distress, chronically ill. HEENT: symmetrical face, PERRLA, good dentition  Neck: no JVD, thyromegaly, or adenopathy, trachea midline Chest: symmetric without deformity Cardiac: PMI non-displaced, RRR, normal S1, S2, no gallop or murmur Lung: clear to ausculation and percussion Vascular: all pulses full without bruits  Abdominal: nondistended, nontender, good  bowel sounds, no HSM, no bruits Extremities: no cyanosis, clubbing, minimal bilateral edema. no sign of DVT, no varicosities  Skin: normal color, no rashes Neuro: alert and oriented x 3, non-focal Pysch: normal affect  Filed Vitals:   06/20/11 0922  BP: 118/54  Pulse: 94  Height: 5\' 4"  (1.626 m)  Weight: 174 lb (78.926 kg)    EKG  Labs and Studies Reviewed.   Lab Results  Component Value Date   WBC 8.0 06/02/2011   HGB 10.9* 06/16/2011   HCT 31.3* 06/16/2011   MCV 95.2 06/16/2011   PLT 49* 06/16/2011      Chemistry      Component Value Date/Time   NA 139 06/08/2011 1300   K 4.3 06/08/2011 1300   CL 100 06/08/2011 1300   CO2 15* 06/08/2011 1300   BUN 30* 06/08/2011 1300   CREATININE 1.25* 06/08/2011 1300   CREATININE 1.08 10/18/2006 1023      Component Value Date/Time   CALCIUM 8.3* 06/08/2011 1300   ALKPHOS 40 06/08/2011 1300   AST 18 06/08/2011 1300   ALT 20 06/08/2011 1300   BILITOT 1.4* 06/08/2011 1300       Lab Results  Component Value Date   CHOL 214* 11/04/2009   CHOL 141 04/27/2009   CHOL 171 04/22/2008   Lab Results  Component Value Date   HDL 69.30 11/04/2009   HDL 16.10 04/27/2009   HDL 96.0 04/22/2008   Lab Results  Component Value Date   LDLCALC 61 04/27/2009   LDLCALC 96 04/22/2008   LDLCALC 72 04/02/2007   Lab Results  Component Value Date   TRIG 383.0* 11/04/2009   TRIG 168.0* 04/27/2009   TRIG 74 04/22/2008   Lab Results  Component Value Date   CHOLHDL 3 11/04/2009   CHOLHDL 3 04/27/2009   CHOLHDL 2.9 CALC 04/22/2008   Lab Results  Component Value Date   HGBA1C 5.7 03/28/2011   Lab Results  Component Value Date   ALT 20 06/08/2011   AST 18 06/08/2011   ALKPHOS 40 06/08/2011   BILITOT 1.4* 06/08/2011   Lab Results  Component Value Date   TSH 1.23 11/04/2009

## 2011-06-20 NOTE — Patient Instructions (Addendum)
Your physician recommends that you schedule a follow-up appointment in: 1 year with Dr. Daleen Squibb  Your physician recommends that you weigh, daily, at the same time every day, and in the same amount of clothing. Please record your daily weights on the handout provided and bring it to your next appointment.  If your weight increases by 2-3 pounds overnight and you have increased shortness of breath or swelling of the feet and legs, you may take an extra furosemide ( lasix) and potassium that day.

## 2011-06-22 ENCOUNTER — Encounter (HOSPITAL_BASED_OUTPATIENT_CLINIC_OR_DEPARTMENT_OTHER): Payer: Medicare Other | Admitting: Oncology

## 2011-06-22 ENCOUNTER — Other Ambulatory Visit: Payer: Self-pay | Admitting: Oncology

## 2011-06-22 DIAGNOSIS — C9000 Multiple myeloma not having achieved remission: Secondary | ICD-10-CM

## 2011-06-22 DIAGNOSIS — Z5111 Encounter for antineoplastic chemotherapy: Secondary | ICD-10-CM

## 2011-06-22 DIAGNOSIS — D638 Anemia in other chronic diseases classified elsewhere: Secondary | ICD-10-CM

## 2011-06-22 LAB — BASIC METABOLIC PANEL
CO2: 26 mEq/L (ref 19–32)
Calcium: 9.8 mg/dL (ref 8.4–10.5)
Creatinine, Ser: 0.94 mg/dL (ref 0.50–1.10)
Sodium: 135 mEq/L (ref 135–145)

## 2011-06-22 LAB — CBC WITH DIFFERENTIAL/PLATELET
EOS%: 1.4 % (ref 0.0–7.0)
Eosinophils Absolute: 0.1 10*3/uL (ref 0.0–0.5)
LYMPH%: 48.8 % (ref 14.0–49.7)
MCH: 31.4 pg (ref 25.1–34.0)
MCHC: 32.7 g/dL (ref 31.5–36.0)
MCV: 96.1 fL (ref 79.5–101.0)
MONO%: 14.2 % — ABNORMAL HIGH (ref 0.0–14.0)
NEUT#: 1.2 10*3/uL — ABNORMAL LOW (ref 1.5–6.5)
Platelets: 62 10*3/uL — ABNORMAL LOW (ref 145–400)
RBC: 3.06 10*6/uL — ABNORMAL LOW (ref 3.70–5.45)
RDW: 15.5 % — ABNORMAL HIGH (ref 11.2–14.5)
nRBC: 0 % (ref 0–0)

## 2011-06-23 ENCOUNTER — Encounter (HOSPITAL_BASED_OUTPATIENT_CLINIC_OR_DEPARTMENT_OTHER): Payer: Medicare Other | Admitting: Oncology

## 2011-06-23 DIAGNOSIS — Z5112 Encounter for antineoplastic immunotherapy: Secondary | ICD-10-CM

## 2011-06-23 DIAGNOSIS — C9000 Multiple myeloma not having achieved remission: Secondary | ICD-10-CM

## 2011-06-28 ENCOUNTER — Other Ambulatory Visit: Payer: Self-pay | Admitting: Internal Medicine

## 2011-06-28 NOTE — Telephone Encounter (Signed)
Patient needs refill amtriptoline &  Potassium - [facility]

## 2011-06-29 ENCOUNTER — Other Ambulatory Visit: Payer: Self-pay | Admitting: Oncology

## 2011-06-29 ENCOUNTER — Encounter (HOSPITAL_BASED_OUTPATIENT_CLINIC_OR_DEPARTMENT_OTHER): Payer: Medicare Other | Admitting: Oncology

## 2011-06-29 DIAGNOSIS — C9000 Multiple myeloma not having achieved remission: Secondary | ICD-10-CM

## 2011-06-29 DIAGNOSIS — D638 Anemia in other chronic diseases classified elsewhere: Secondary | ICD-10-CM

## 2011-06-29 DIAGNOSIS — Z5112 Encounter for antineoplastic immunotherapy: Secondary | ICD-10-CM

## 2011-06-29 LAB — CROSSMATCH: ABO/RH(D): A POS

## 2011-06-29 LAB — CBC WITH DIFFERENTIAL/PLATELET
BASO%: 0.2 % (ref 0.0–2.0)
Eosinophils Absolute: 0.1 10*3/uL (ref 0.0–0.5)
MONO#: 0.8 10*3/uL (ref 0.1–0.9)
NEUT#: 1.3 10*3/uL — ABNORMAL LOW (ref 1.5–6.5)
Platelets: 56 10*3/uL — ABNORMAL LOW (ref 145–400)
RBC: 2.97 10*6/uL — ABNORMAL LOW (ref 3.70–5.45)
RDW: 16 % — ABNORMAL HIGH (ref 11.2–14.5)
WBC: 4.7 10*3/uL (ref 3.9–10.3)
lymph#: 2.5 10*3/uL (ref 0.9–3.3)

## 2011-06-29 MED ORDER — AMITRIPTYLINE HCL 10 MG PO TABS: 20.0000 mg | ORAL_TABLET | Freq: Every day | ORAL | Status: DC

## 2011-06-29 NOTE — Telephone Encounter (Signed)
Potassium was recently filled by Cardiologist, rx for amitriptyline faxed

## 2011-06-30 ENCOUNTER — Encounter (HOSPITAL_BASED_OUTPATIENT_CLINIC_OR_DEPARTMENT_OTHER): Payer: Medicare Other | Admitting: Oncology

## 2011-06-30 DIAGNOSIS — Z5112 Encounter for antineoplastic immunotherapy: Secondary | ICD-10-CM

## 2011-06-30 DIAGNOSIS — C9000 Multiple myeloma not having achieved remission: Secondary | ICD-10-CM

## 2011-06-30 LAB — DIFFERENTIAL
Basophils Relative: 0
Eosinophils Absolute: 0.1
Monocytes Absolute: 0.4
Monocytes Relative: 10
Neutrophils Relative %: 45

## 2011-06-30 LAB — CROSSMATCH

## 2011-06-30 LAB — CBC
HCT: 24.4 — ABNORMAL LOW
HCT: 24.5 — ABNORMAL LOW
Hemoglobin: 8.6 — ABNORMAL LOW
Hemoglobin: 8.6 — ABNORMAL LOW
MCHC: 34
MCHC: 35.2
MCHC: 35.2
MCV: 91.7
RBC: 2.64 — ABNORMAL LOW
RBC: 3.48 — ABNORMAL LOW
RDW: 18.8 — ABNORMAL HIGH

## 2011-06-30 LAB — BASIC METABOLIC PANEL
CO2: 31
Calcium: 9.9
Glucose, Bld: 93
Potassium: 3.9
Sodium: 141

## 2011-06-30 LAB — SEDIMENTATION RATE: Sed Rate: 132 — ABNORMAL HIGH

## 2011-07-03 LAB — TYPE AND SCREEN: ABO/RH(D): A POS

## 2011-07-03 LAB — BASIC METABOLIC PANEL
BUN: 14
BUN: 14
CO2: 25
Calcium: 9
Chloride: 106
Chloride: 107
Creatinine, Ser: 1.07
Creatinine, Ser: 1.08
Glucose, Bld: 105 — ABNORMAL HIGH
Glucose, Bld: 107 — ABNORMAL HIGH

## 2011-07-03 LAB — COMPREHENSIVE METABOLIC PANEL
AST: 20
AST: 31
Albumin: 2.7 — ABNORMAL LOW
Albumin: 3.2 — ABNORMAL LOW
BUN: 31 — ABNORMAL HIGH
Calcium: 8.6
Calcium: 9.6
Chloride: 106
Creatinine, Ser: 1.18
Creatinine, Ser: 1.24 — ABNORMAL HIGH
GFR calc Af Amer: 50 — ABNORMAL LOW
GFR calc Af Amer: 53 — ABNORMAL LOW
Total Protein: 10.2 — ABNORMAL HIGH
Total Protein: 11.9 — ABNORMAL HIGH

## 2011-07-03 LAB — CBC
MCHC: 34.4
MCHC: 35
MCHC: 35.2
MCHC: 35.8
MCV: 89.4
MCV: 90.8
MCV: 91.1
MCV: 91.1
Platelets: 113 — ABNORMAL LOW
Platelets: 115 — ABNORMAL LOW
Platelets: 115 — ABNORMAL LOW
RDW: 18.4 — ABNORMAL HIGH
RDW: 18.7 — ABNORMAL HIGH
RDW: 18.9 — ABNORMAL HIGH
WBC: 4.3
WBC: 4.9
WBC: 5.4

## 2011-07-03 LAB — OCCULT BLOOD X 1 CARD TO LAB, STOOL
Fecal Occult Bld: NEGATIVE
Fecal Occult Bld: NEGATIVE

## 2011-07-03 LAB — IGG, IGA, IGM: IgM, Serum: 7880 — ABNORMAL HIGH

## 2011-07-03 LAB — CLOSTRIDIUM DIFFICILE EIA
C difficile Toxins A+B, EIA: NEGATIVE
C difficile Toxins A+B, EIA: NEGATIVE

## 2011-07-03 LAB — DIFFERENTIAL
Eosinophils Relative: 0
Lymphocytes Relative: 35
Lymphs Abs: 1.9
Monocytes Absolute: 0.8

## 2011-07-03 LAB — URINALYSIS, ROUTINE W REFLEX MICROSCOPIC
Bilirubin Urine: NEGATIVE
Hgb urine dipstick: NEGATIVE
Nitrite: NEGATIVE
Specific Gravity, Urine: 1.007
pH: 5.5

## 2011-07-03 LAB — PROTEIN ELECTROPH W RFLX QUANT IMMUNOGLOBULINS
Albumin ELP: 38.9 — ABNORMAL LOW
Alpha-2-Globulin: 9.1
Beta 2: 3.2
Beta Globulin: 3.7 — ABNORMAL LOW

## 2011-07-03 LAB — IMMUNOFIXATION ADD-ON

## 2011-07-03 LAB — LIPASE, BLOOD: Lipase: 44

## 2011-07-03 LAB — SAMPLE TO BLOOD BANK

## 2011-07-03 NOTE — H&P (Signed)
NAME:  Karen Barron, Karen Barron               ACCOUNT NO.:  [id number]  MEDICAL RECORD NO.:  [id number]  LOCATION:                                 FACILITY:  PHYSICIAN:  Jonny Ruiz, MD    DATE OF BIRTH:  Aug 28, 1926  DATE OF ADMISSION:  06/02/2011 DATE OF DISCHARGE:                             HISTORY & PHYSICAL   CHIEF COMPLAINT:  Shortness of breath and feeling sick.  HISTORY OF PRESENT ILLNESS:  The patient is an 77 year old female recently discharged from [facility] for same symptoms of dyspnea and hypoxia,  which was felt to be secondary to volume overload in the setting of diastolic heart failure (August 17 through May 31, 2011).  The patient returned to the emergency department at [facility] in the company of her husband because of worsening shortness of breath, generalized weakness, and nausea.  The patient had no vomiting or diarrhea.  In fact, the patient has been somewhat constipated.  She denies fever, chills, or sweats.  The patient was seen in the emergency department and initial workup revealed a hemoglobin of 6.7, hematocrit 28.2.  Subsequently, ED physician, Dr. Clarene Duke contacted [place] for admission and management.  The patient denies orthopnea, nocturia, PND, chest pain, or palpitations.  PAST MEDICAL HISTORY: 1. Diastolic heart failure with recent dyspnea and hypoxia secondary     to volume overload as described above. 2. Multiple myeloma status post initiation of carfilzomib therapy on     August 16 and underwent chemo another round yesterday. 3. Anemia secondary to multiple myeloma status post transfusion of     packed RBCs on August 18 and 17, and discharged with a hemoglobin     of 8.5 on May 30, 2011. 4. Thrombocytopenia secondary to multiple myeloma. 5. Chronic back pain secondary to T12 compression fracture. 6. History of herpes zoster. 7. Renal insufficiency. 8. Peripheral neuropathy, likely related to previous Velcade therapy. 9.  Postherpetic neuralgia.  PAST SURGICAL HISTORY: 1. Bilateral knee replacement. 2. Shoulder arthroscopic surgery. 3. Tonsillectomy. 4. Appendectomy. 5. Hysterectomy. 6. T12 kyphoplasty by Dr. Noel Gerold on February 03, 2011.  Status post T7     kyphoplasty on November 23, 2007. 7. Cataract surgery.  MEDICATIONS: 1. Toprol-XL 50 mg a day. 2. Lasix 40 mg a.m. and 20 mg q.p.m. 3. Zantac 150 mg b.i.d. 4. EMLA cream as needed. 5. Imodium as needed. 6. Synthroid 75 mcg a day. 7. Nexium 20 mg a day. 8. Zocor 20 mg a day. 9. Elavil 20 mg a day. 10.K-Dur 20 mEq twice daily. 11.Dilaudid 4 mg every 6 hours as needed. 12.Lomotil as needed. 13.Compazine 10 mg q.6 h. as needed.  ALLERGIES:  GUAIFENESIN, NAPROXEN, CLADRIBINE, AND INDOMETHACIN.  PRIMARY CARE PHYSICIAN:  Dr. Clearance Coots.  INTERNAL MEDICINE CARDIOLOGY DOCTOR:  Jesse Sans. Daleen Squibb, MD, [facility].  ONCOLOGIST:  Ladene Artist, M.D.  FAMILY HISTORY:  Noncontributory.  SOCIAL HISTORY:  Mrs. Fraser is married and lives in [place].  She uses no tobacco, alcohol, or illicits.  REVIEW OF SYSTEMS:  As in HPI.  PHYSICAL EXAMINATION:  VITAL SIGNS:  Blood pressure 116/42, heart rate 99, respirations 18, temperature 97.4, pulse ox 100%. GENERAL APPEARANCE:  The patient is a Caucasian female who is sitting on a chair in the exam room, in no acute distress.  She is pleasant and cooperative.  She looks pale. HEENT:  PERRLA, EOMI, sclerae normal, oral cavity moist mucosa without rashes or thrush.  Ears are normal. NECK:  Supple.  No lymphadenopathy.  No JVD.  No carotid bruits. HEART:  Regular S1 and S2 without gallops, murmurs, or rubs. LUNGS:  Clear to auscultation. ABDOMEN:  Nondistended, soft, and nontender without organomegaly or masses palpable. EXTREMITIES:  With 1+ pitting edema bilaterally at the level of the ankles. NEUROLOGICAL:  Awake and oriented x3.  Cranial nerves II-XII grossly intact.  Motor strength 5/5 upper  and lower extremities.  Sensory no deficits.  No cerebellar symptoms.  RADIOLOGY:  Chest x-ray, negative except for bilateral atelectasis/scarring.  No acute cardiopulmonary disease noted.  EKG; normal sinus rhythm without ST or T-wave abnormalities.  Hemoglobin 6.7, hematocrit 20.2, platelet 61, D-dimer negative.  Arterial blood gases normal.  BUN 41, creatinine 1.48.  UA shows 7-10 wbc's with small leukocyte esterase.  Urine cultures sent from ED and pending obviously  ASSESSMENT AND PLAN:  An 77 year old female with history of multiple myeloma presenting with recurrent shortness of breath, generalized weakness, and nausea for the second time after being discharged from [facility] on August, the 22nd and found with hemoglobin of 6.7 as well as a mild UTI.  PROBLEM LIST: 1. Anemia.  We will type and cross and transfuse 2 units of packed     RBCs.  We will give Lasix 40 mg in between units. 2. Mild UTI.  While waiting for the urine culture, we will place her     on ciprofloxacin 250 p.o. b.i.d. 3. Multiple myeloma.  Received a round of carfilzomib IV yesterday.     We will notify Dr. Truett Perna of the patient's new admission. 4. Thrombocytopenia without bleeding.  Secondary to multiple myeloma     problem 5. Renal insufficiency.  Stable at this time. 6. Chronic low back pain with the use of Tylenol with hydrocodone     5/325, 1-2 tablets p.o. q.4 hour p.r.n., and Dilaudid 1 mg IV q.4     for more severe pain.  Case was discussed with Dr. Clarene Duke.  Plan of care discussed with the patient and husband, they agreed with admission to the hospital.  The patient is a full code.          ______________________________ Jonny Ruiz, MD     GL/MEDQ  D:  06/02/2011  T:  06/02/2011  Job:  [id number]  cc:   Dr. Octavia Bruckner C. Daleen Squibb, MD, [facility] Ladene Artist, M.D.  Electronically Signed by Jonny Ruiz MD on 07/03/2011 04:04:34 PM

## 2011-07-06 ENCOUNTER — Encounter (HOSPITAL_BASED_OUTPATIENT_CLINIC_OR_DEPARTMENT_OTHER): Payer: Medicare Other | Admitting: Oncology

## 2011-07-06 ENCOUNTER — Other Ambulatory Visit: Payer: Self-pay | Admitting: Oncology

## 2011-07-06 DIAGNOSIS — Z5112 Encounter for antineoplastic immunotherapy: Secondary | ICD-10-CM

## 2011-07-06 DIAGNOSIS — C9000 Multiple myeloma not having achieved remission: Secondary | ICD-10-CM

## 2011-07-06 DIAGNOSIS — D638 Anemia in other chronic diseases classified elsewhere: Secondary | ICD-10-CM

## 2011-07-06 DIAGNOSIS — Z23 Encounter for immunization: Secondary | ICD-10-CM

## 2011-07-06 LAB — CBC WITH DIFFERENTIAL/PLATELET
BASO%: 0.5 % (ref 0.0–2.0)
Eosinophils Absolute: 0.1 10*3/uL (ref 0.0–0.5)
MCHC: 32.6 g/dL (ref 31.5–36.0)
MCV: 96.4 fL (ref 79.5–101.0)
MONO#: 0.6 10*3/uL (ref 0.1–0.9)
MONO%: 13.5 % (ref 0.0–14.0)
NEUT#: 1.6 10*3/uL (ref 1.5–6.5)
RBC: 2.8 10*6/uL — ABNORMAL LOW (ref 3.70–5.45)
RDW: 16.5 % — ABNORMAL HIGH (ref 11.2–14.5)
WBC: 4.4 10*3/uL (ref 3.9–10.3)
nRBC: 1 % — ABNORMAL HIGH (ref 0–0)

## 2011-07-06 LAB — TECHNOLOGIST REVIEW

## 2011-07-07 ENCOUNTER — Encounter (HOSPITAL_BASED_OUTPATIENT_CLINIC_OR_DEPARTMENT_OTHER): Payer: Medicare Other | Admitting: Oncology

## 2011-07-07 DIAGNOSIS — Z5112 Encounter for antineoplastic immunotherapy: Secondary | ICD-10-CM

## 2011-07-07 DIAGNOSIS — C9000 Multiple myeloma not having achieved remission: Secondary | ICD-10-CM

## 2011-07-10 NOTE — H&P (Signed)
NAME:  Karen Barron, Karen Barron               ACCOUNT NO.:  [id number]  MEDICAL RECORD NO.:  [id number]  LOCATION:                                 FACILITY:  PHYSICIAN:  Ladene Artist, M.D.  DATE OF BIRTH:  08-Jan-1926  DATE OF ADMISSION:  05/26/2011 DATE OF DISCHARGE:                             HISTORY & PHYSICAL   CHIEF COMPLAINT:  Dyspnea.  HISTORY OF PRESENT ILLNESS:  Karen Barron is an 77 year old woman with multiple myeloma.  She has been treated with multiple systemic therapy regimens in the past.  She was treated with Velcade and Cytoxan/Decadron on a weekly schedule beginning in May 2011.  She completed 1-year of treatment on January 24, 2011.  The serum M-spike and IgM level have increased over the past several months.  She began treatment with carfilzomib on May 25, 2011.  Karen Barron has severe anemia secondary to multiple myeloma.  The hemoglobin on May 25, 2011, returned at 7.8.  She was transfused with 1 unit of packed red blood cells on May 25, 2011, with plans for transfusion of a second unit today.  Mr. Drozdowski reports progressive dyspnea on exertion.  She noted worsening dyspnea overnight.  She slept sitting upright.  She notes chest "heaviness" when she leans forward.  She has a history of diastolic CHF. She currently takes Lasix 40 mg each morning and 20 mg each evening. She was given 20 mg of Lasix intravenously on May 25, 2011, while in the office.  She denies leg swelling.  She has stable back pain.  No fever.  She reports good urine output.  She complains of malaise.  Chest x-ray done earlier today shows no definite pneumonia or heart failure.  A BNP returned markedly elevated at 2912.  PAST MEDICAL HISTORY: 1. IgG multiple myeloma. 2. Anemia secondary to multiple myeloma. 3. Diastolic congestive heart failure diagnosed in May 2011. 4. Chronic back pain status post T12 kyphoplasty by Dr. Noel Gerold on February 02, 2010.  Status post T7 kyphoplasty November 23, 2007. 5. History of thrombocytopenia secondary to multiple myeloma and     chemotherapy. 6. History of mild renal insufficiency versus an elevated     BUN/creatinine secondary to polypharmacy. 7. Peripheral neuropathy, likely related to previous Velcade therapy. 8. History of a zoster rash at the left abdomen and chest wall. 9. Postherpetic neuralgia. 10.History of neutropenia secondary to Velcade and Cytoxan.  MEDICATIONS: 1. Compazine 10 mg every 6 hours as needed. 2. Toprol XL 50 mg daily. 3. Lasix 40 mg q.a.m. and 20 mg q.p.m. 4. Zantac 150 mg twice daily. 5. EMLA cream as needed. 6. Imodium as needed. 7. Synthroid 75 mcg daily. 8. Nexium 20 mg daily. 9. Zocor 20 mg nightly. 10.Elavil 20 mg nightly. 11.K-Dur 20 mEq twice daily. 12.Dilaudid 4 mg every 6 hours as needed. 13.Lomotil as needed.  ALLERGIES: 1. GUAIFENESIN. 2. NAPROXEN. 3. CLADRIBINE. 4. INDOMETHACIN.  FAMILY HISTORY:  Noncontributory.  SOCIAL HISTORY:  Karen Barron lives in [place].  She is married.  No tobacco or alcohol use.  REVIEW OF SYSTEMS:  Per HPI.  PHYSICAL EXAMINATION:  VITAL SIGNS: Temperature 98.1, heart rate 102, respirations  22, blood pressure 123/75, oxygen saturation 96% on room air.  Weight 184 pounds (181.3 pounds on May 25, 2011). GENERAL:  Pleasant female in no acute distress. HEENT: Normocephalic, atraumatic.  Oropharynx is without thrush or ulceration. CHEST/LUNGS:  Clear.  No wheezes or rales.  Port-A-Cath site is without erythema.  Mildly increased respiratory rate.  CARDIOVASCULAR:  Regular rate and rhythm.  Mild neck vein distention. ABDOMEN:  Soft and nontender.  No organomegaly. EXTREMITIES:  Trace lower leg edema bilaterally.  LABORATORY DATA:  Labs from May 25, 2011, hemoglobin 7.8, white count 6.2, absolute neutrophil count 3.2, platelet count 90,000.  Sodium 138, potassium 4.1, BUN 25, creatinine 1.27, glucose 74, bilirubin 0.9, alkaline phosphatase 50, SGOT 16,  SGPT 11, total protein 9.5, albumin 3.4, calcium 9.6.  Labs May 26, 2011, BNP 2912.  Chest x-ray today - no definite pneumonia or heart failure.  IMPRESSION AND PLAN: 1. Myeloma status post cycle 1 day 1 of carfilzomib on May 25, 2011. 2. Anemia secondary to myeloma status post 1 unit packed red blood     cells on May 25, 2011 with a second unit being transfused today. 3. History of diastolic congestive heart failure with probable acute     exacerbation secondary to volume with chemotherapy and the blood     transfusion as well as anemia.  The BNP is markedly elevated. 4. Dyspnea secondary to history of diastolic congestive heart failure. 5. Thrombocytopenia secondary to multiple myeloma and chemotherapy.  Ms. Vanostrand will be admitted to [facility] for diuresis.  We will obtain a follow up BNP and a CBC on May 27, 2011.  The patient interviewed and examined by Dr. Truett Perna.  Plan per Dr. Truett Perna.     Arnaldo Natal, NP   ______________________________ Ladene Artist, M.D.    LCT/MEDQ  D:  05/26/2011  T:  05/26/2011  Job:  [id number]  Electronically Signed by Lonna Cobb N.P. on 06/14/2011 04:41:37 PM Electronically Signed by Thornton Papas M.D. on 07/10/2011 08:39:55 AM

## 2011-07-10 NOTE — Discharge Summary (Signed)
  NAME:  Karen Barron, Karen Barron               ACCOUNT NO.:  [id number]  MEDICAL RECORD NO.:  [id number]  LOCATION:  1303                         FACILITY:  [facility] [place]  PHYSICIAN:  Ladene Artist, M.D.  DATE OF BIRTH:  Jun 03, 1926  DATE OF ADMISSION:  05/26/2011 DATE OF DISCHARGE:  05/31/2011                              DISCHARGE SUMMARY   CONDITION AT DISCHARGE:  Improved.  DISCHARGE DIAGNOSES: 1. Admission with dyspnea/hypoxia secondary to volume overload in the     setting of diastolic heart failure. 2. Multiple myeloma, status post initiation of Carfilzomib therapy on     August 16. 3. Anemia secondary to multiple myeloma - status post packed red blood     cells on August 18 and August 17. 4. Thrombocytopenia secondary to multiple myeloma. 5. Chronic back pain secondary to a T12 compression fracture. 6. History of herpes zoster. 7. Renal insufficiency.  HOSPITAL CONSULTATIONS:  Dr. Patty Sermons, [facility]  [facility] PROCEDURES:  None.  HOSPITAL COURSE:  [name] is a an 77 year old with a long history of multiple myeloma.  There has been recent progression of the multiple myeloma with a rising serum IgM level and progressive anemia/thrombocytopenia.  She was started on salvage therapy with Carfilzomib on August 16.  She was noted to be short of breath on August 16 and severely anemic.  The dyspnea was initially felt to be related to anemia.  She was transfused with packed red blood cells on August 16..  She returned for scheduled Carfilzomib therapy on August 17 and another unit of packed red blood cells.  She was noted to have increased shortness of breath on 08/17 and she was admitted for further evaluation.  I discussed the case with Dr. Daleen Squibb and she was placed on furosemide diuresis.  She was seen in consultation while hospitalized by the [facility].  Her dyspnea improved significantly during this hospital admission with aggressive furosemide diuresis.   The weight on hospital admission returned at 87.8 kg and was down to 81.3 kg on the day of discharge.  The shortness of breath was much improved.  An echocardiogram confirmed evidence of diastolic dysfunction.  The hemoglobin remained low at 7.8 on August 20, and she was transfused with 1 unit of packed red blood cells.  The hemoglobin was up to 8.5 on August 21.  [name] has mild renal insufficiency.  The creatinine returned at 1.35 with a BUN of 29 on the day of discharge.  She appeared stable for discharge on the morning of August 22.  DISCHARGE MEDICATIONS:  See electronic medical record.  FOLLOWUP:  Follow-up care will be with Dr. Truett Perna within the next 1-2 weeks.  She will return for scheduled Carfilzomib chemotherapy on August 23.     Ladene Artist, M.D.     GBS/MEDQ  D:  05/31/2011  T:  05/31/2011  Job:  [id number]  cc:   Jesse Sans. Wall, MD, FACC [address]  [address] [place] [address]  Electronically Signed by Thornton Papas M.D. on 07/10/2011 08:39:49 AM

## 2011-07-12 ENCOUNTER — Other Ambulatory Visit: Payer: Self-pay | Admitting: Oncology

## 2011-07-12 ENCOUNTER — Encounter (HOSPITAL_BASED_OUTPATIENT_CLINIC_OR_DEPARTMENT_OTHER): Payer: Medicare Other | Admitting: Oncology

## 2011-07-12 DIAGNOSIS — D638 Anemia in other chronic diseases classified elsewhere: Secondary | ICD-10-CM

## 2011-07-12 DIAGNOSIS — C9000 Multiple myeloma not having achieved remission: Secondary | ICD-10-CM

## 2011-07-12 LAB — CBC WITH DIFFERENTIAL/PLATELET
BASO%: 0.3 % (ref 0.0–2.0)
Basophils Absolute: 0 10*3/uL (ref 0.0–0.1)
EOS%: 1.2 % (ref 0.0–7.0)
HGB: 8.8 g/dL — ABNORMAL LOW (ref 11.6–15.9)
MCH: 33.8 pg (ref 25.1–34.0)
MCV: 95.8 fL (ref 79.5–101.0)
MONO%: 16.8 % — ABNORMAL HIGH (ref 0.0–14.0)
RBC: 2.61 10*6/uL — ABNORMAL LOW (ref 3.70–5.45)
RDW: 18 % — ABNORMAL HIGH (ref 11.2–14.5)
lymph#: 2.1 10*3/uL (ref 0.9–3.3)

## 2011-07-14 LAB — PROTEIN ELECTROPHORESIS, SERUM
Albumin ELP: 41.1 % — ABNORMAL LOW (ref 55.8–66.1)
Alpha-1-Globulin: 2.7 % — ABNORMAL LOW (ref 2.9–4.9)
Alpha-2-Globulin: 6.9 % — ABNORMAL LOW (ref 7.1–11.8)
Beta Globulin: 3.4 % — ABNORMAL LOW (ref 4.7–7.2)
M-Spike, %: 3.74 g/dL
Total Protein, Serum Electrophoresis: 9.4 g/dL — ABNORMAL HIGH (ref 6.0–8.3)

## 2011-07-14 LAB — BASIC METABOLIC PANEL
BUN: 27 mg/dL — ABNORMAL HIGH (ref 6–23)
Chloride: 97 mEq/L (ref 96–112)
Potassium: 3.9 mEq/L (ref 3.5–5.3)
Sodium: 136 mEq/L (ref 135–145)

## 2011-07-14 LAB — IGM: IgM, Serum: 5960 mg/dL — ABNORMAL HIGH (ref 52–322)

## 2011-07-20 ENCOUNTER — Other Ambulatory Visit: Payer: Self-pay | Admitting: Oncology

## 2011-07-20 ENCOUNTER — Encounter (HOSPITAL_COMMUNITY)
Admission: RE | Admit: 2011-07-20 | Discharge: 2011-07-20 | Disposition: A | Discharge status: Home / Self Care | Payer: Medicare Other | Source: Ambulatory Visit | Attending: Oncology | Admitting: Oncology

## 2011-07-20 ENCOUNTER — Encounter (HOSPITAL_BASED_OUTPATIENT_CLINIC_OR_DEPARTMENT_OTHER): Payer: Medicare Other | Admitting: Oncology

## 2011-07-20 DIAGNOSIS — R5381 Other malaise: Secondary | ICD-10-CM

## 2011-07-20 DIAGNOSIS — D638 Anemia in other chronic diseases classified elsewhere: Secondary | ICD-10-CM

## 2011-07-20 DIAGNOSIS — R0609 Other forms of dyspnea: Secondary | ICD-10-CM

## 2011-07-20 DIAGNOSIS — C9000 Multiple myeloma not having achieved remission: Secondary | ICD-10-CM

## 2011-07-20 DIAGNOSIS — D649 Anemia, unspecified: Secondary | ICD-10-CM | POA: Insufficient documentation

## 2011-07-20 LAB — CBC WITH DIFFERENTIAL/PLATELET
Basophils Absolute: 0 10*3/uL (ref 0.0–0.1)
Eosinophils Absolute: 0 10*3/uL (ref 0.0–0.5)
HCT: 21.7 % — ABNORMAL LOW (ref 34.8–46.6)
HGB: 7.6 g/dL — ABNORMAL LOW (ref 11.6–15.9)
LYMPH%: 43.7 % (ref 14.0–49.7)
MCV: 97.9 fL (ref 79.5–101.0)
MONO#: 0.4 10*3/uL (ref 0.1–0.9)
MONO%: 11.7 % (ref 0.0–14.0)
NEUT#: 1.6 10*3/uL (ref 1.5–6.5)
Platelets: 76 10*3/uL — ABNORMAL LOW (ref 145–400)
RBC: 2.22 10*6/uL — ABNORMAL LOW (ref 3.70–5.45)
WBC: 3.7 10*3/uL — ABNORMAL LOW (ref 3.9–10.3)

## 2011-07-20 LAB — CROSSMATCH
ABO/RH(D): A POS
Antibody Screen: NEGATIVE

## 2011-07-20 LAB — BASIC METABOLIC PANEL
BUN: 15 mg/dL (ref 6–23)
CO2: 28 mEq/L (ref 19–32)
Calcium: 9.5 mg/dL (ref 8.4–10.5)
Glucose, Bld: 183 mg/dL — ABNORMAL HIGH (ref 70–99)
Potassium: 3.8 mEq/L (ref 3.5–5.3)
Sodium: 138 mEq/L (ref 135–145)

## 2011-07-21 ENCOUNTER — Encounter (HOSPITAL_BASED_OUTPATIENT_CLINIC_OR_DEPARTMENT_OTHER): Payer: Medicare Other | Admitting: Oncology

## 2011-07-21 DIAGNOSIS — C9000 Multiple myeloma not having achieved remission: Secondary | ICD-10-CM

## 2011-07-21 DIAGNOSIS — D638 Anemia in other chronic diseases classified elsewhere: Secondary | ICD-10-CM

## 2011-07-21 LAB — SAMPLE TO BLOOD BANK

## 2011-07-22 LAB — CROSSMATCH: Unit division: 0

## 2011-07-24 LAB — CBC
HCT: 20.8 — ABNORMAL LOW
HCT: 20.9 — ABNORMAL LOW
HCT: 21.3 — ABNORMAL LOW
HCT: 21.5 — ABNORMAL LOW
Hemoglobin: 7.4 — CL
Hemoglobin: 7.6 — CL
Hemoglobin: 7.6 — CL
Hemoglobin: 9 — ABNORMAL LOW
MCHC: 35.1
MCHC: 35.2
MCHC: 35.5
MCV: 90.4
MCV: 90.6
MCV: 91
Platelets: 127 — ABNORMAL LOW
Platelets: 128 — ABNORMAL LOW
Platelets: 130 — ABNORMAL LOW
RBC: 2.3 — ABNORMAL LOW
RBC: 2.82 — ABNORMAL LOW
RDW: 17.7 — ABNORMAL HIGH
RDW: 17.8 — ABNORMAL HIGH
RDW: 17.8 — ABNORMAL HIGH
RDW: 18 — ABNORMAL HIGH
WBC: 5.6

## 2011-07-24 LAB — BASIC METABOLIC PANEL
BUN: 6
BUN: 8
BUN: 9
CO2: 25
CO2: 26
Chloride: 101
Chloride: 103
Creatinine, Ser: 0.97
GFR calc non Af Amer: 51 — ABNORMAL LOW
GFR calc non Af Amer: 52 — ABNORMAL LOW
Glucose, Bld: 115 — ABNORMAL HIGH
Glucose, Bld: 119 — ABNORMAL HIGH
Glucose, Bld: 120 — ABNORMAL HIGH
Potassium: 3.1 — ABNORMAL LOW
Potassium: 3.4 — ABNORMAL LOW
Potassium: 3.5
Sodium: 136
Sodium: 137

## 2011-07-24 LAB — CLOSTRIDIUM DIFFICILE EIA
C difficile Toxins A+B, EIA: NEGATIVE
C difficile Toxins A+B, EIA: NEGATIVE

## 2011-07-24 LAB — URINALYSIS, ROUTINE W REFLEX MICROSCOPIC
Bilirubin Urine: NEGATIVE
Hgb urine dipstick: NEGATIVE
Specific Gravity, Urine: 1.02
pH: 5.5

## 2011-07-24 LAB — DIFFERENTIAL
Basophils Absolute: 0
Basophils Absolute: 0
Basophils Relative: 0
Basophils Relative: 0
Basophils Relative: 1
Eosinophils Absolute: 0
Eosinophils Absolute: 0.1
Eosinophils Relative: 0
Eosinophils Relative: 1
Eosinophils Relative: 2
Lymphocytes Relative: 12
Lymphocytes Relative: 28
Lymphocytes Relative: 29
Lymphs Abs: 1.7
Monocytes Absolute: 0.5
Monocytes Absolute: 0.6
Monocytes Relative: 10
Monocytes Relative: 6
Neutro Abs: 3.3

## 2011-07-24 LAB — FECAL LACTOFERRIN, QUANT: Fecal Lactoferrin: POSITIVE

## 2011-07-24 LAB — CULTURE, BLOOD (ROUTINE X 2)
Culture: NO GROWTH
Culture: NO GROWTH

## 2011-07-24 LAB — URINE MICROSCOPIC-ADD ON

## 2011-07-24 LAB — COMPREHENSIVE METABOLIC PANEL
ALT: 20
Alkaline Phosphatase: 40
CO2: 22
Calcium: 9.3
Chloride: 105
GFR calc non Af Amer: 51 — ABNORMAL LOW
Glucose, Bld: 121 — ABNORMAL HIGH
Potassium: 3.6
Sodium: 139
Total Bilirubin: 1

## 2011-07-24 LAB — HEMOGLOBIN AND HEMATOCRIT, BLOOD: Hemoglobin: 9 — ABNORMAL LOW

## 2011-07-24 LAB — TYPE AND SCREEN: Antibody Screen: NEGATIVE

## 2011-07-24 LAB — STOOL CULTURE

## 2011-07-24 LAB — URINE CULTURE

## 2011-07-24 LAB — OVA AND PARASITE EXAMINATION

## 2011-07-24 LAB — POTASSIUM: Potassium: 3.2 — ABNORMAL LOW

## 2011-08-07 ENCOUNTER — Telehealth: Payer: Self-pay | Admitting: Oncology

## 2011-08-07 ENCOUNTER — Other Ambulatory Visit: Payer: Self-pay | Admitting: Oncology

## 2011-08-07 ENCOUNTER — Encounter (HOSPITAL_BASED_OUTPATIENT_CLINIC_OR_DEPARTMENT_OTHER): Payer: Medicare Other | Admitting: Oncology

## 2011-08-07 DIAGNOSIS — Z79899 Other long term (current) drug therapy: Secondary | ICD-10-CM

## 2011-08-07 DIAGNOSIS — G609 Hereditary and idiopathic neuropathy, unspecified: Secondary | ICD-10-CM

## 2011-08-07 DIAGNOSIS — R5381 Other malaise: Secondary | ICD-10-CM

## 2011-08-07 DIAGNOSIS — D6959 Other secondary thrombocytopenia: Secondary | ICD-10-CM

## 2011-08-07 DIAGNOSIS — D638 Anemia in other chronic diseases classified elsewhere: Secondary | ICD-10-CM

## 2011-08-07 DIAGNOSIS — R0989 Other specified symptoms and signs involving the circulatory and respiratory systems: Secondary | ICD-10-CM

## 2011-08-07 DIAGNOSIS — C9000 Multiple myeloma not having achieved remission: Secondary | ICD-10-CM

## 2011-08-07 LAB — CBC WITH DIFFERENTIAL/PLATELET
Basophils Absolute: 0 10*3/uL (ref 0.0–0.1)
EOS%: 0.6 % (ref 0.0–7.0)
HGB: 8.6 g/dL — ABNORMAL LOW (ref 11.6–15.9)
MCH: 32.9 pg (ref 25.1–34.0)
MCHC: 34.8 g/dL (ref 31.5–36.0)
MCV: 94.6 fL (ref 79.5–101.0)
MONO%: 14.8 % — ABNORMAL HIGH (ref 0.0–14.0)
RBC: 2.6 10*6/uL — ABNORMAL LOW (ref 3.70–5.45)
RDW: 21 % — ABNORMAL HIGH (ref 11.2–14.5)

## 2011-08-07 LAB — COMPREHENSIVE METABOLIC PANEL
AST: 18 U/L (ref 0–37)
Albumin: 3.8 g/dL (ref 3.5–5.2)
Alkaline Phosphatase: 36 U/L — ABNORMAL LOW (ref 39–117)
BUN: 17 mg/dL (ref 6–23)
Potassium: 3.8 mEq/L (ref 3.5–5.3)

## 2011-08-07 NOTE — Telephone Encounter (Signed)
gv pt husband appt schedule for nov. lmonvm for nurse asking if pt needs to see LT w/lb on 11/12 - no order.

## 2011-08-11 ENCOUNTER — Telehealth: Payer: Self-pay | Admitting: Oncology

## 2011-08-11 NOTE — Telephone Encounter (Signed)
Per susan as to pt/husband question about 11/12 appt. appt for 11/12 is to be lab only.

## 2011-08-21 ENCOUNTER — Telehealth: Payer: Self-pay | Admitting: *Deleted

## 2011-08-21 ENCOUNTER — Other Ambulatory Visit: Payer: Self-pay | Admitting: *Deleted

## 2011-08-21 ENCOUNTER — Ambulatory Visit (HOSPITAL_BASED_OUTPATIENT_CLINIC_OR_DEPARTMENT_OTHER): Payer: Medicare Other

## 2011-08-21 ENCOUNTER — Other Ambulatory Visit: Payer: Self-pay | Admitting: Oncology

## 2011-08-21 ENCOUNTER — Telehealth: Payer: Self-pay | Admitting: Oncology

## 2011-08-21 ENCOUNTER — Other Ambulatory Visit: Payer: Medicare Other | Admitting: Lab

## 2011-08-21 DIAGNOSIS — R0609 Other forms of dyspnea: Secondary | ICD-10-CM

## 2011-08-21 DIAGNOSIS — R0989 Other specified symptoms and signs involving the circulatory and respiratory systems: Secondary | ICD-10-CM

## 2011-08-21 DIAGNOSIS — C9000 Multiple myeloma not having achieved remission: Secondary | ICD-10-CM

## 2011-08-21 DIAGNOSIS — R5381 Other malaise: Secondary | ICD-10-CM

## 2011-08-21 DIAGNOSIS — D638 Anemia in other chronic diseases classified elsewhere: Secondary | ICD-10-CM

## 2011-08-21 LAB — CBC WITH DIFFERENTIAL/PLATELET
Basophils Absolute: 0 10*3/uL (ref 0.0–0.1)
EOS%: 1.2 % (ref 0.0–7.0)
HCT: 20.6 % — ABNORMAL LOW (ref 34.8–46.6)
HGB: 7.2 g/dL — ABNORMAL LOW (ref 11.6–15.9)
LYMPH%: 32.4 % (ref 14.0–49.7)
MCH: 34.3 pg — ABNORMAL HIGH (ref 25.1–34.0)
MCV: 97.5 fL (ref 79.5–101.0)
MONO%: 16.3 % — ABNORMAL HIGH (ref 0.0–14.0)
NEUT%: 49.6 % (ref 38.4–76.8)
RDW: 23.8 % — ABNORMAL HIGH (ref 11.2–14.5)

## 2011-08-21 NOTE — Telephone Encounter (Signed)
Patient reports no dyspnea or dizziness; just fatigue. No bleeding. Per Dr. Truett Perna: Hold this cycle of melphalan & prednisone and recheck CBC next week with type & hold. Patient understands and agrees.

## 2011-08-21 NOTE — Telephone Encounter (Signed)
S/w the pt and she is aware of the lab appt on 08/28/2011.

## 2011-08-22 LAB — COMPREHENSIVE METABOLIC PANEL
AST: 14 U/L (ref 0–37)
Alkaline Phosphatase: 38 U/L — ABNORMAL LOW (ref 39–117)
BUN: 13 mg/dL (ref 6–23)
Creatinine, Ser: 1.04 mg/dL (ref 0.50–1.10)
Total Bilirubin: 0.7 mg/dL (ref 0.3–1.2)

## 2011-08-23 ENCOUNTER — Telehealth: Payer: Self-pay | Admitting: *Deleted

## 2011-08-23 NOTE — Telephone Encounter (Signed)
Called pt, IGM levels better, per Dr. Truett Perna. Follow up as scheduled, will begin next cycle when WBC are better, pt verbalized understanding.

## 2011-08-28 ENCOUNTER — Telehealth: Payer: Self-pay | Admitting: *Deleted

## 2011-08-28 ENCOUNTER — Other Ambulatory Visit: Payer: Self-pay | Admitting: Oncology

## 2011-08-28 ENCOUNTER — Encounter: Payer: Self-pay | Admitting: *Deleted

## 2011-08-28 ENCOUNTER — Other Ambulatory Visit (HOSPITAL_BASED_OUTPATIENT_CLINIC_OR_DEPARTMENT_OTHER): Payer: Medicare Other | Admitting: Lab

## 2011-08-28 DIAGNOSIS — D638 Anemia in other chronic diseases classified elsewhere: Secondary | ICD-10-CM

## 2011-08-28 LAB — CBC WITH DIFFERENTIAL/PLATELET
Basophils Absolute: 0 10*3/uL (ref 0.0–0.1)
Eosinophils Absolute: 0 10*3/uL (ref 0.0–0.5)
HGB: 7.4 g/dL — ABNORMAL LOW (ref 11.6–15.9)
LYMPH%: 44.7 % (ref 14.0–49.7)
MCV: 100.4 fL (ref 79.5–101.0)
MONO#: 0.4 10*3/uL (ref 0.1–0.9)
MONO%: 13.1 % (ref 0.0–14.0)
NEUT#: 1.2 10*3/uL — ABNORMAL LOW (ref 1.5–6.5)
Platelets: 79 10*3/uL — ABNORMAL LOW (ref 145–400)
RDW: 24.6 % — ABNORMAL HIGH (ref 11.2–14.5)

## 2011-08-28 NOTE — Telephone Encounter (Signed)
Per Dr. Truett Perna: OK to start cycle #2 Melphalan & Prednisone, but take only 8 mg Melphalan/day instead of previous 10 mg. Patient understands and agrees. Will follow up as scheduled. Made her aware that IgM was improved. She does not feel any different than last week, so have decided not to transfuse her at this time.

## 2011-09-01 ENCOUNTER — Telehealth: Payer: Self-pay | Admitting: Oncology

## 2011-09-01 NOTE — Telephone Encounter (Signed)
S/w the pt regarding her revised appt d/t on 09/06/2011

## 2011-09-05 ENCOUNTER — Other Ambulatory Visit: Payer: Self-pay | Admitting: *Deleted

## 2011-09-06 ENCOUNTER — Ambulatory Visit (HOSPITAL_BASED_OUTPATIENT_CLINIC_OR_DEPARTMENT_OTHER): Payer: Medicare Other | Admitting: Nurse Practitioner

## 2011-09-06 ENCOUNTER — Other Ambulatory Visit: Payer: Self-pay | Admitting: *Deleted

## 2011-09-06 ENCOUNTER — Other Ambulatory Visit: Payer: Self-pay | Admitting: Internal Medicine

## 2011-09-06 ENCOUNTER — Ambulatory Visit: Payer: Medicare Other | Admitting: Oncology

## 2011-09-06 ENCOUNTER — Ambulatory Visit: Payer: Medicare Other | Admitting: Nurse Practitioner

## 2011-09-06 ENCOUNTER — Other Ambulatory Visit (HOSPITAL_BASED_OUTPATIENT_CLINIC_OR_DEPARTMENT_OTHER): Payer: Medicare Other

## 2011-09-06 DIAGNOSIS — D638 Anemia in other chronic diseases classified elsewhere: Secondary | ICD-10-CM

## 2011-09-06 DIAGNOSIS — C9000 Multiple myeloma not having achieved remission: Secondary | ICD-10-CM

## 2011-09-06 DIAGNOSIS — R0609 Other forms of dyspnea: Secondary | ICD-10-CM

## 2011-09-06 DIAGNOSIS — R5381 Other malaise: Secondary | ICD-10-CM

## 2011-09-06 DIAGNOSIS — Z452 Encounter for adjustment and management of vascular access device: Secondary | ICD-10-CM

## 2011-09-06 DIAGNOSIS — D6959 Other secondary thrombocytopenia: Secondary | ICD-10-CM

## 2011-09-06 DIAGNOSIS — D649 Anemia, unspecified: Secondary | ICD-10-CM

## 2011-09-06 DIAGNOSIS — G609 Hereditary and idiopathic neuropathy, unspecified: Secondary | ICD-10-CM

## 2011-09-06 LAB — COMPREHENSIVE METABOLIC PANEL
AST: 13 U/L (ref 0–37)
Albumin: 3.7 g/dL (ref 3.5–5.2)
BUN: 22 mg/dL (ref 6–23)
CO2: 25 mEq/L (ref 19–32)
Calcium: 8.8 mg/dL (ref 8.4–10.5)
Chloride: 102 mEq/L (ref 96–112)
Creatinine, Ser: 1.04 mg/dL (ref 0.50–1.10)
Glucose, Bld: 178 mg/dL — ABNORMAL HIGH (ref 70–99)
Potassium: 3.5 mEq/L (ref 3.5–5.3)

## 2011-09-06 LAB — CBC WITH DIFFERENTIAL/PLATELET
BASO%: 0.2 % (ref 0.0–2.0)
Basophils Absolute: 0 10*3/uL (ref 0.0–0.1)
HCT: 20.3 % — ABNORMAL LOW (ref 34.8–46.6)
HGB: 7.2 g/dL — ABNORMAL LOW (ref 11.6–15.9)
LYMPH%: 32.5 % (ref 14.0–49.7)
MCHC: 35.3 g/dL (ref 31.5–36.0)
MONO#: 0.1 10*3/uL (ref 0.1–0.9)
NEUT%: 60.4 % (ref 38.4–76.8)
Platelets: 94 10*3/uL — ABNORMAL LOW (ref 145–400)
WBC: 1.7 10*3/uL — ABNORMAL LOW (ref 3.9–10.3)

## 2011-09-06 MED ORDER — HYDROMORPHONE HCL 4 MG PO TABS: 4.0000 mg | ORAL_TABLET | Freq: Four times a day (QID) | ORAL | Status: DC | PRN

## 2011-09-06 MED ORDER — HEPARIN SOD (PORK) LOCK FLUSH 100 UNIT/ML IV SOLN
500.0000 [IU] | Freq: Once | INTRAVENOUS | Status: AC
  Administered 2011-09-06: 500 [IU] via INTRAVENOUS
  Filled 2011-09-06: qty 5

## 2011-09-06 MED ORDER — SODIUM CHLORIDE 0.9 % IJ SOLN
10.0000 mL | INTRAMUSCULAR | Status: DC | PRN
  Administered 2011-09-06: 10 mL via INTRAVENOUS
  Filled 2011-09-06: qty 10

## 2011-09-06 NOTE — Progress Notes (Signed)
OFFICE PROGRESS NOTE  Interval history:  Karen Barron is an 77 year old woman with multiple myeloma. She is currently being treated with melphalan/prednisone. She began cycle 1 on 07/25/2011. Repeat IgM level on 08/21/2011 was improved at 3890 as compared to 5960 on 07/12/2011. She completed cycle 2 melphalan/prednisone beginning 08/29/2011. The melphalan dose was reduced from 10 mg per day to 8 mg per day for the 4 days with cycle 2.  She denies nausea or vomiting. No mouth sores. No diarrhea. Her energy level continues to be poor. She has mild dyspnea on exertion. She denies chest pain.   Objective: Blood pressure 120/69, pulse 94, temperature 96.9 F (36.1 C), temperature source Oral, height 5' 3.5" (1.613 m), weight 168 lb (76.204 kg).  Oropharynx is without thrush or ulceration. No palpable cervical or supra-clavicular lymph nodes. Lungs are clear. No wheezes or rales. Regular cardiac rhythm. Port-A-Cath site is without erythema. Abdomen is soft and nontender. No organomegaly. Trace lower leg edema bilaterally.   Lab Results: Hemoglobin 7.2 white count 1.7 absolute neutrophil count 1.0 platelet count 94,000  Studies/Results: No results found.  Medications: I have reviewed the patient's current medications.  Assessment/Plan: 1. Multiple myeloma treated with subcutaneous Velcade and Cytoxan/Decadron on a weekly schedule beginning in May 2011.  She completed 1 year of treatment on 01/24/2011.  The serum M-spike and IgM level were increased beginning in May 2012.  She began treatment with carfilzomib on 05/25/2011.  She completed 2 cycles.  The serum M-spike and IgM level were higher on 07/12/2011.  She began cycle 1 melphalan/prednisone 07/25/2011. The IgM level was improved on 08/21/2011. She began cycle 2 melphalan/prednisone on 08/29/2011. The melphalan dose was reduced to 8 mg daily for 4 days beginning with cycle 2. 2. Hospitalization with dyspnea/hypoxia secondary to volume overload in  the setting of diastolic heart failure on 05/26/2011. 3. Hospitalization 06/02/2011 with dyspnea.  She was found to have marked anemia with a hemoglobin of 6.7.  She received a red cell transfusion. 4. History of congestive heart failure status post hospital admission 05/26/2011. 5. Thrombocytopenia secondary to multiple myeloma, Velcade/Cytoxan and carfilzomib.  6. T7 compression fracture status post kyphoplasty 11/23/2007 by Dr. Noel Gerold. 7. Peripheral neuropathy status post evaluation by Dr. Anne Hahn.  The neuropathy is most likely related to Velcade therapy. 8. Herpes zoster at the left abdomen and chest wall status post Valtrex therapy. 9. Postherpetic neuralgia. 10. History of neutropenia secondary to Velcade and Cytoxan. 11. Pain and swelling of the proximal interphalangeal joint of the right 3rd finger when she was here on 02/21/2011, improved.  She was treated with a Medrol Dosepak. 12.   Persistent severe anemia.  She was  transfused one unit of blood on 07/20/11 and one unit on 07/21/11.  Disposition-Karen Barron appears stable. She has completed 2 cycles of melphalan/prednisone. The IgM level was improved after the first cycle. Hemoglobin remains stable. She will return for labs to include a CBC, chemistry panel and IgM level on 09/21/2011. She will return for a followup visit with Dr. Truett Perna on 09/26/2011. She will contact the office in the interim with any problems.  Plan reviewed with Dr. Truett Perna.      Karen Barron ANP/GNP-BC

## 2011-09-07 MED ORDER — SIMVASTATIN 20 MG PO TABS: 20.0000 mg | ORAL_TABLET | Freq: Every day | ORAL | Status: DC

## 2011-09-07 NOTE — Telephone Encounter (Signed)
Patient needs labs Lipid/Hep 272.4/995.20

## 2011-09-21 ENCOUNTER — Other Ambulatory Visit (HOSPITAL_BASED_OUTPATIENT_CLINIC_OR_DEPARTMENT_OTHER): Payer: Medicare Other | Admitting: Lab

## 2011-09-21 ENCOUNTER — Encounter: Payer: Self-pay | Admitting: *Deleted

## 2011-09-21 DIAGNOSIS — C888 Other malignant immunoproliferative diseases not having achieved remission: Secondary | ICD-10-CM

## 2011-09-21 DIAGNOSIS — Z5112 Encounter for antineoplastic immunotherapy: Secondary | ICD-10-CM

## 2011-09-21 LAB — COMPREHENSIVE METABOLIC PANEL
Albumin: 4.1 g/dL (ref 3.5–5.2)
Alkaline Phosphatase: 40 U/L (ref 39–117)
BUN: 9 mg/dL (ref 6–23)
Calcium: 9.3 mg/dL (ref 8.4–10.5)
Chloride: 103 mEq/L (ref 96–112)
Glucose, Bld: 109 mg/dL — ABNORMAL HIGH (ref 70–99)
Potassium: 3.9 mEq/L (ref 3.5–5.3)
Sodium: 141 mEq/L (ref 135–145)
Total Protein: 7.9 g/dL (ref 6.0–8.3)

## 2011-09-21 LAB — CBC WITH DIFFERENTIAL/PLATELET
Basophils Absolute: 0 10*3/uL (ref 0.0–0.1)
Eosinophils Absolute: 0 10*3/uL (ref 0.0–0.5)
HGB: 7.5 g/dL — ABNORMAL LOW (ref 11.6–15.9)
MONO#: 0.4 10*3/uL (ref 0.1–0.9)
MONO%: 18.9 % — ABNORMAL HIGH (ref 0.0–14.0)
NEUT#: 0.9 10*3/uL — ABNORMAL LOW (ref 1.5–6.5)
RBC: 1.98 10*6/uL — ABNORMAL LOW (ref 3.70–5.45)
RDW: 23.5 % — ABNORMAL HIGH (ref 11.2–14.5)
WBC: 2.2 10*3/uL — ABNORMAL LOW (ref 3.9–10.3)
lymph#: 0.8 10*3/uL — ABNORMAL LOW (ref 0.9–3.3)

## 2011-09-21 NOTE — Progress Notes (Signed)
PT. IS AT THIS OFFICE FOR LAB. SHE WANTS TO SEE DR.SHERRILL CONCERNING  SORENESS, ITCHING, AND HURTING OF HER RIGHT FOOT. SHE ALSO HAS  A BLISTER ON THE LITTLE TOE OF HER RIGHT FOOT. THIS MESSAGE WAS GIVEN TO DR.SHERRILL'S NURSE, SUSAN COWARD,RN.

## 2011-09-26 ENCOUNTER — Encounter (HOSPITAL_COMMUNITY)
Admission: RE | Admit: 2011-09-26 | Discharge: 2011-09-26 | Disposition: A | Discharge status: Home / Self Care | Payer: Medicare Other | Source: Ambulatory Visit | Attending: Oncology | Admitting: Oncology

## 2011-09-26 ENCOUNTER — Ambulatory Visit (HOSPITAL_BASED_OUTPATIENT_CLINIC_OR_DEPARTMENT_OTHER): Payer: Medicare Other | Admitting: Oncology

## 2011-09-26 ENCOUNTER — Other Ambulatory Visit: Payer: Self-pay | Admitting: Oncology

## 2011-09-26 ENCOUNTER — Other Ambulatory Visit: Payer: Self-pay | Admitting: *Deleted

## 2011-09-26 ENCOUNTER — Telehealth: Payer: Self-pay | Admitting: Oncology

## 2011-09-26 ENCOUNTER — Ambulatory Visit (HOSPITAL_BASED_OUTPATIENT_CLINIC_OR_DEPARTMENT_OTHER): Payer: Medicare Other

## 2011-09-26 VITALS — BP 115/67 | HR 95 | Temp 97.0°F | Ht 63.5 in | Wt 169.6 lb

## 2011-09-26 DIAGNOSIS — C9000 Multiple myeloma not having achieved remission: Secondary | ICD-10-CM | POA: Insufficient documentation

## 2011-09-26 DIAGNOSIS — D649 Anemia, unspecified: Secondary | ICD-10-CM

## 2011-09-26 DIAGNOSIS — C9001 Multiple myeloma in remission: Secondary | ICD-10-CM

## 2011-09-26 DIAGNOSIS — Z09 Encounter for follow-up examination after completed treatment for conditions other than malignant neoplasm: Secondary | ICD-10-CM

## 2011-09-26 LAB — CBC WITH DIFFERENTIAL/PLATELET
Basophils Absolute: 0 10*3/uL (ref 0.0–0.1)
EOS%: 0.9 % (ref 0.0–7.0)
LYMPH%: 33.9 % (ref 14.0–49.7)
MCH: 38.4 pg — ABNORMAL HIGH (ref 25.1–34.0)
MCV: 107.1 fL — ABNORMAL HIGH (ref 79.5–101.0)
MONO%: 17.3 % — ABNORMAL HIGH (ref 0.0–14.0)
Platelets: 103 10*3/uL — ABNORMAL LOW (ref 145–400)
RBC: 1.96 10*6/uL — ABNORMAL LOW (ref 3.70–5.45)
RDW: 22.9 % — ABNORMAL HIGH (ref 11.2–14.5)

## 2011-09-26 NOTE — Telephone Encounter (Signed)
gve the pt his dec,jan 2013 appt calendar °

## 2011-09-26 NOTE — Progress Notes (Signed)
OFFICE PROGRESS NOTE  Interval history:  She as scheduled. She was here for labs on December 13 she complained of inflammation at one of the right toes. She had an area of erythema with superficial ulceration between 2 of the right toes. She reports soaking the right foot and there has been clinical improvement. Her chief complaint is malaise. She requests a red cell transfusion. She has stable back pain.   Objective: Blood pressure 115/67, pulse 95, temperature 97 F (36.1 C), temperature source Oral, height 5' 3.5" (1.613 m), weight 169 lb 9.6 oz (76.93 kg).  Oropharynx is without thrush or ulceration.Lungs are clear. No wheezes or rales. Regular cardiac rhythm. Port-A-Cath site is without erythema. Abdomen is soft and nontender. No organomegaly. Examination of the right foot reveals no erythema, ulceration, or discharge. There is an area of callus or blistering between 2 of the right toes.   Lab Results: Hemoglobin 7.2 white count 1.7 absolute neutrophil count 1.0 platelet count 94,000   No results found.  Medications: I have reviewed the patient's current medications.  Assessment/Plan: 1. Multiple myeloma treated with subcutaneous Velcade and Cytoxan/Decadron on a weekly schedule beginning in May 2011.  She completed 1 year of treatment on 01/24/2011.  The serum M-spike and IgM level were increased beginning in May 2012.  She began treatment with carfilzomib on 05/25/2011.  She completed 2 cycles.  The serum M-spike and IgM level were higher on 07/12/2011.  She began cycle 1 melphalan/prednisone 07/25/2011. The IgM level was improved on 08/21/2011. She began cycle 2 melphalan/prednisone on 08/29/2011. The melphalan dose was reduced to 8 mg daily for 4 days beginning with cycle 2. The IgM level from December 13 is pending 2. Hospitalization with dyspnea/hypoxia secondary to volume overload in the setting of diastolic heart failure on 05/26/2011. 3. Hospitalization 06/02/2011 with dyspnea.   She was found to have marked anemia with a hemoglobin of 6.7.  She received a red cell transfusion. 4. History of congestive heart failure status post hospital admission 05/26/2011. 5. Thrombocytopenia secondary to multiple myeloma, Velcade/Cytoxan and carfilzomib.  6. T7 compression fracture status post kyphoplasty 11/23/2007 by Dr. Noel Gerold. 7. Peripheral neuropathy status post evaluation by Dr. Anne Hahn.  The neuropathy is most likely related to Velcade therapy. 8. Herpes zoster at the left abdomen and chest wall status post Valtrex therapy. 9. Postherpetic neuralgia. 10. History of neutropenia secondary to Velcade and Cytoxan. There is persistent neutropenia, likely related to multiple myeloma and melphalan-stable 11. Pain and swelling of the proximal interphalangeal joint of the right 3rd finger when she was here on 02/21/2011, improved.  She was treated with a Medrol Dosepak. 12.   Persistent severe anemia.  She was  transfused one unit of blood on 07/20/11 and one unit on 07/21/11. She again has symptomatic anemia. She'll be scheduled for a red cell transfusion on December 19 .    Disposition: [name] has symptomatic anemia. She will be scheduled for a red cell transfusion on December 19. We will followup on the IgM level from December 13. The plan is to proceed with a third cycle of melphalan/prednisone if the IgM level is stable or improved.  [name] will return for an office visit in 3 weeks.      Karen Barron BRADLEY ANP/GNP-BC

## 2011-09-27 ENCOUNTER — Ambulatory Visit (HOSPITAL_BASED_OUTPATIENT_CLINIC_OR_DEPARTMENT_OTHER): Payer: Medicare Other

## 2011-09-27 DIAGNOSIS — C9000 Multiple myeloma not having achieved remission: Secondary | ICD-10-CM

## 2011-09-27 DIAGNOSIS — D649 Anemia, unspecified: Secondary | ICD-10-CM

## 2011-09-27 LAB — IGM: IgM, Serum: 3060 mg/dL — ABNORMAL HIGH (ref 52–322)

## 2011-09-27 MED ORDER — MELPHALAN 2 MG PO TABS: 6.0000 mg | ORAL_TABLET | Freq: Every day | ORAL | Status: DC

## 2011-09-27 NOTE — Patient Instructions (Signed)
Patient discharged home with husband; tolerated transfusion well; post vital signs stable

## 2011-09-27 NOTE — Progress Notes (Signed)
Reviewed IgM results with patient and husband. Per Dr. Eulogio Bear next cycle of Melphalan and Prednisone with dose decrease of Melphalan to 6 mg.

## 2011-09-28 LAB — TYPE AND SCREEN
ABO/RH(D): A POS
Antibody Screen: NEGATIVE
Unit division: 0

## 2011-10-06 ENCOUNTER — Telehealth: Payer: Self-pay | Admitting: Internal Medicine

## 2011-10-06 MED ORDER — AMITRIPTYLINE HCL 10 MG PO TABS: 20.0000 mg | ORAL_TABLET | Freq: Every day | ORAL | Status: DC

## 2011-10-06 NOTE — Telephone Encounter (Signed)
RX sent

## 2011-10-06 NOTE — Telephone Encounter (Signed)
Patient needs refill amtriptoline 10mg  2 tabs by mouth at bedtime - 90 day supply - [facility]

## 2011-10-08 ENCOUNTER — Telehealth: Payer: Self-pay | Admitting: Oncology

## 2011-10-08 NOTE — Telephone Encounter (Signed)
On call: daughter calling [phone]) re chest congestion and productive cough since 12-27, no fever, not SOB. Greenish sputum. No allergy to azithromycin and should be ok with mucinex (no DM). Z pack called to [facility] on [phone].

## 2011-10-17 ENCOUNTER — Telehealth: Payer: Self-pay | Admitting: Internal Medicine

## 2011-10-17 MED ORDER — LEVOTHYROXINE SODIUM 75 MCG PO TABS: 75.0000 ug | ORAL_TABLET | Freq: Every day | ORAL | Status: DC

## 2011-10-17 NOTE — Telephone Encounter (Signed)
Patient will need TSH 244.9

## 2011-10-17 NOTE — Telephone Encounter (Signed)
Patient would like a refill of synthroid [facility]

## 2011-10-18 ENCOUNTER — Other Ambulatory Visit: Payer: Self-pay | Admitting: Oncology

## 2011-10-18 ENCOUNTER — Ambulatory Visit (HOSPITAL_BASED_OUTPATIENT_CLINIC_OR_DEPARTMENT_OTHER): Payer: Medicare Other

## 2011-10-18 ENCOUNTER — Telehealth: Payer: Self-pay | Admitting: Oncology

## 2011-10-18 ENCOUNTER — Other Ambulatory Visit: Payer: Medicare Other | Admitting: Lab

## 2011-10-18 ENCOUNTER — Ambulatory Visit (HOSPITAL_BASED_OUTPATIENT_CLINIC_OR_DEPARTMENT_OTHER): Payer: Medicare Other | Admitting: Oncology

## 2011-10-18 VITALS — BP 112/63 | HR 84 | Temp 97.8°F | Ht 63.5 in | Wt 167.5 lb

## 2011-10-18 DIAGNOSIS — R0609 Other forms of dyspnea: Secondary | ICD-10-CM

## 2011-10-18 DIAGNOSIS — R0989 Other specified symptoms and signs involving the circulatory and respiratory systems: Secondary | ICD-10-CM

## 2011-10-18 DIAGNOSIS — C9001 Multiple myeloma in remission: Secondary | ICD-10-CM

## 2011-10-18 DIAGNOSIS — R5381 Other malaise: Secondary | ICD-10-CM

## 2011-10-18 DIAGNOSIS — C9000 Multiple myeloma not having achieved remission: Secondary | ICD-10-CM

## 2011-10-18 DIAGNOSIS — R5383 Other fatigue: Secondary | ICD-10-CM

## 2011-10-18 DIAGNOSIS — D6959 Other secondary thrombocytopenia: Secondary | ICD-10-CM

## 2011-10-18 DIAGNOSIS — D638 Anemia in other chronic diseases classified elsewhere: Secondary | ICD-10-CM

## 2011-10-18 DIAGNOSIS — G609 Hereditary and idiopathic neuropathy, unspecified: Secondary | ICD-10-CM

## 2011-10-18 DIAGNOSIS — D649 Anemia, unspecified: Secondary | ICD-10-CM

## 2011-10-18 LAB — CBC WITH DIFFERENTIAL/PLATELET
Basophils Absolute: 0 10*3/uL (ref 0.0–0.1)
Eosinophils Absolute: 0 10*3/uL (ref 0.0–0.5)
HGB: 9.1 g/dL — ABNORMAL LOW (ref 11.6–15.9)
MONO#: 0.5 10*3/uL (ref 0.1–0.9)
NEUT#: 1.2 10*3/uL — ABNORMAL LOW (ref 1.5–6.5)
RBC: 2.67 10*6/uL — ABNORMAL LOW (ref 3.70–5.45)
RDW: 18.7 % — ABNORMAL HIGH (ref 11.2–14.5)
WBC: 2.9 10*3/uL — ABNORMAL LOW (ref 3.9–10.3)
nRBC: 0 % (ref 0–0)

## 2011-10-18 LAB — COMPREHENSIVE METABOLIC PANEL
Alkaline Phosphatase: 38 U/L — ABNORMAL LOW (ref 39–117)
BUN: 16 mg/dL (ref 6–23)
Creatinine, Ser: 0.89 mg/dL (ref 0.50–1.10)
Glucose, Bld: 79 mg/dL (ref 70–99)
Total Bilirubin: 0.8 mg/dL (ref 0.3–1.2)

## 2011-10-18 MED ORDER — ZOLEDRONIC ACID 4 MG/100ML IV SOLN
4.0000 mg | Freq: Once | INTRAVENOUS | Status: AC
  Administered 2011-10-18: 4 mg via INTRAVENOUS
  Filled 2011-10-18: qty 100

## 2011-10-18 MED ORDER — MELPHALAN 2 MG PO TABS: 6.0000 mg | ORAL_TABLET | Freq: Every day | ORAL | Status: DC

## 2011-10-18 MED ORDER — PREDNISONE 20 MG PO TABS: 80.0000 mg | ORAL_TABLET | Freq: Every day | ORAL | Status: DC

## 2011-10-18 NOTE — Telephone Encounter (Signed)
appt made on  2/6 9:00/9:30 per dr Truett Perna   aom

## 2011-10-18 NOTE — Progress Notes (Signed)
OFFICE PROGRESS NOTE   INTERVAL HISTORY:   She returns as scheduled. She completed another cycle of melphalan and prednisone. She reports tolerating the chemotherapy well. She had an upper respiratory infection last week. She was treated with antibiotic. She developed abdominal cramping and diarrhea after taking the antibiotic.  Objective:  Vital signs in last 24 hours:  Blood pressure 112/63, pulse 84, temperature 97.8 F (36.6 C), temperature source Oral, height 5' 3.5" (1.613 m), weight 167 lb 8 oz (75.978 kg).    HEENT: No thrush or ulcers Resp: Lungs clear bilaterally Cardio: Regular rate and rhythm GI: The abdomen is nontender. No hepatosplenomegaly Vascular: No leg edema  Skin: Small area of superficial desquamation at the base of the right third toe. No discharge. No fluctuance.   Portacath/PICC-without erythema  Lab Results:  Lab Results  Component Value Date   WBC 2.9* 10/18/2011   HGB 9.1* 10/18/2011   HCT 26.9* 10/18/2011   MCV 100.7 10/18/2011   PLT 119* 10/18/2011   IgM on 09/21/2011, 3060   Medications: I have reviewed the patient's current medications.  Assessment/Plan: 1. Multiple myeloma treated with subcutaneous Velcade and Cytoxan/Decadron on a weekly schedule beginning in May 2011. She completed 1 year of treatment on 01/24/2011. The serum M-spike and IgM level were increased beginning in May 2012. She began treatment with carfilzomib on 05/25/2011. She completed 2 cycles. The serum M-spike and IgM level were higher on 07/12/2011. She began cycle 1 melphalan/prednisone 07/25/2011. The IgM level was improved on 08/21/2011. She began cycle 2 melphalan/prednisone on 08/29/2011. The melphalan dose was reduced to 8 mg daily for 4 days beginning with cycle 2. The melphalan was dose reduced to 6 mg daily for 4 days beginning with the third cycle of melphalan/prednisone on 09/27/2011. 2. Hospitalization with dyspnea/hypoxia secondary to volume overload in the setting of  diastolic heart failure on 05/26/2011. 3. Hospitalization 06/02/2011 with dyspnea. She was found to have marked anemia with a hemoglobin of 6.7. She received a red cell transfusion 09/27/2011. 4. History of congestive heart failure status post hospital admission 05/26/2011. 5. Thrombocytopenia secondary to multiple myeloma, Velcade/Cytoxan and carfilzomib.  6. T7 compression fracture status post kyphoplasty 11/23/2007 by Dr. Noel Gerold. 7. Peripheral neuropathy status post evaluation by Dr. Anne Hahn. The neuropathy is most likely related to Velcade therapy. 8. Herpes zoster at the left abdomen and chest wall status post Valtrex therapy. 9. Postherpetic neuralgia. 10. History of neutropenia secondary to Velcade and Cytoxan. There is persistent neutropenia, likely related to multiple myeloma and melphalan-stable 11. Pain and swelling of the proximal interphalangeal joint of the right 3rd finger when she was here on 02/21/2011, improved. She was treated with a Medrol Dosepak. 12. Persistent severe anemia. She was transfused one unit of blood on 07/20/11 and one unit on 07/21/11. She again has symptomatic anemia. She'll be scheduled for a red cell transfusion on December 19 .     Disposition:  She appears stable. The IgM level has decreased with melphalan/prednisone. The plan is to proceed with a fourth cycle of melphalan/prednisone beginning on January 17. She will return for an office visit in one month. She received Zometa today.   Lucile Shutters, MD  10/18/2011  6:18 PM

## 2011-10-19 ENCOUNTER — Other Ambulatory Visit: Payer: Self-pay | Admitting: *Deleted

## 2011-10-19 MED ORDER — HYDROMORPHONE HCL 4 MG PO TABS: 4.0000 mg | ORAL_TABLET | Freq: Four times a day (QID) | ORAL | Status: DC | PRN

## 2011-11-10 ENCOUNTER — Other Ambulatory Visit: Payer: Self-pay | Admitting: Cardiology

## 2011-11-10 MED ORDER — METOPROLOL SUCCINATE ER 50 MG PO TB24: 50.0000 mg | ORAL_TABLET | Freq: Every day | ORAL | Status: DC

## 2011-11-10 NOTE — Telephone Encounter (Signed)
New Refill Request

## 2011-11-15 ENCOUNTER — Telehealth: Payer: Self-pay | Admitting: Oncology

## 2011-11-15 ENCOUNTER — Other Ambulatory Visit: Payer: Medicare Other

## 2011-11-15 ENCOUNTER — Ambulatory Visit (HOSPITAL_BASED_OUTPATIENT_CLINIC_OR_DEPARTMENT_OTHER): Payer: Medicare Other | Admitting: Oncology

## 2011-11-15 VITALS — BP 128/68 | HR 96 | Temp 97.5°F | Ht 63.5 in | Wt 167.8 lb

## 2011-11-15 DIAGNOSIS — D630 Anemia in neoplastic disease: Secondary | ICD-10-CM

## 2011-11-15 DIAGNOSIS — C9001 Multiple myeloma in remission: Secondary | ICD-10-CM

## 2011-11-15 LAB — CBC WITH DIFFERENTIAL/PLATELET
Basophils Absolute: 0 10*3/uL (ref 0.0–0.1)
Eosinophils Absolute: 0 10*3/uL (ref 0.0–0.5)
HGB: 8.3 g/dL — ABNORMAL LOW (ref 11.6–15.9)
LYMPH%: 40 % (ref 14.0–49.7)
MCV: 103.8 fL — ABNORMAL HIGH (ref 79.5–101.0)
MONO%: 18.5 % — ABNORMAL HIGH (ref 0.0–14.0)
NEUT#: 0.8 10*3/uL — ABNORMAL LOW (ref 1.5–6.5)
Platelets: 86 10*3/uL — ABNORMAL LOW (ref 145–400)
RDW: 18.2 % — ABNORMAL HIGH (ref 11.2–14.5)

## 2011-11-15 NOTE — Progress Notes (Signed)
OFFICE PROGRESS NOTE   INTERVAL HISTORY:   She returns as scheduled. She completed another cycle of melphalan and prednisone beginning on 10/25/2011. She reports stable back pain. She does not have significant dyspnea at present.  Objective:  Vital signs in last 24 hours:  Blood pressure 128/68, pulse 96, temperature 97.5 F (36.4 C), temperature source Oral, height 5' 3.5" (1.613 m), weight 167 lb 12.8 oz (76.114 kg).    HEENT: No thrush or ulcers Resp: Lungs clear bilaterally Cardio: Regular rate and rhythm GI: No hepatosplenomegaly Vascular: Trace edema at the left greater than right lower leg      Lab Results:  Lab Results  Component Value Date   WBC 2.0* 11/15/2011   HGB 8.3* 11/15/2011   HCT 24.3* 11/15/2011   MCV 103.8* 11/15/2011   PLT 86* 11/15/2011   ANC 0.8    Medications: I have reviewed the patient's current medications.  Assessment:  1. Multiple myeloma treated with subcutaneous Velcade and Cytoxan/Decadron on a weekly schedule beginning in May 2011. She completed 1 year of treatment on 01/24/2011. The serum M-spike and IgM level were increased beginning in May 2012. She began treatment with carfilzomib on 05/25/2011. She completed 2 cycles. The serum M-spike and IgM level were higher on 07/12/2011. She began cycle 1 melphalan/prednisone 07/25/2011. The IgM level was improved on 08/21/2011. She began cycle 2 melphalan/prednisone on 08/29/2011. The melphalan dose was reduced to 8 mg daily for 4 days beginning with cycle 2. The melphalan was dose reduced to 6 mg daily for 4 days beginning with the third cycle of melphalan/prednisone on 09/27/2011. She completed a fourth cycle of melphalan/prednisone beginning on 10/25/2011. 2. Hospitalization with dyspnea/hypoxia secondary to volume overload in the setting of diastolic heart failure on 05/26/2011. 3. Hospitalization 06/02/2011 with dyspnea. She was found to have marked anemia with a hemoglobin of 6.7. She received a red  cell transfusion 09/27/2011. 4. History of congestive heart failure status post hospital admission 05/26/2011. 5. Thrombocytopenia secondary to multiple myeloma, Velcade/Cytoxan and carfilzomib.  6. T7 compression fracture status post kyphoplasty 11/23/2007 by Dr. Noel Gerold. 7. Peripheral neuropathy status post evaluation by Dr. Anne Hahn. The neuropathy is most likely related to Velcade therapy. 8. Herpes zoster at the left abdomen and chest wall status post Valtrex therapy. 9. Postherpetic neuralgia. 10. History of neutropenia secondary to Velcade and Cytoxan. There is persistent neutropenia, likely related to multiple myeloma and melphalan-stable 11. Pain and swelling of the proximal interphalangeal joint of the right 3rd finger when she was here on 02/21/2011, improved. She was treated with a Medrol Dosepak. 12. Persistent severe anemia. She was last transfused with packed red blood cells on 09/26/2012.     Disposition:  She appears stable. She does not have significant symptoms from the anemia at present. She will return for a CBC next week. She will begin the next cycle of melphalan/prednisone if the neutrophils and platelets are improved.  She will return for another CBC on 12/07/2011. She is scheduled for office visit on 12/19/2011. The Port-A-Cath was flushed today.   Lucile Shutters, MD  11/15/2011  7:29 PM

## 2011-11-15 NOTE — Telephone Encounter (Signed)
appts made and printed for 2/12 2/28 3/12  aom

## 2011-11-16 LAB — IGM: IgM, Serum: 2950 mg/dL — ABNORMAL HIGH (ref 52–322)

## 2011-11-16 LAB — COMPREHENSIVE METABOLIC PANEL
Albumin: 4.1 g/dL (ref 3.5–5.2)
Alkaline Phosphatase: 36 U/L — ABNORMAL LOW (ref 39–117)
BUN: 11 mg/dL (ref 6–23)
Glucose, Bld: 96 mg/dL (ref 70–99)
Potassium: 3.7 mEq/L (ref 3.5–5.3)

## 2011-11-21 ENCOUNTER — Other Ambulatory Visit (HOSPITAL_BASED_OUTPATIENT_CLINIC_OR_DEPARTMENT_OTHER): Payer: Medicare Other

## 2011-11-21 DIAGNOSIS — C9001 Multiple myeloma in remission: Secondary | ICD-10-CM

## 2011-11-21 LAB — CBC WITH DIFFERENTIAL/PLATELET
Eosinophils Absolute: 0 10*3/uL (ref 0.0–0.5)
HGB: 7.9 g/dL — ABNORMAL LOW (ref 11.6–15.9)
MCV: 104.4 fL — ABNORMAL HIGH (ref 79.5–101.0)
MONO%: 15.9 % — ABNORMAL HIGH (ref 0.0–14.0)
NEUT#: 1 10*3/uL — ABNORMAL LOW (ref 1.5–6.5)
RBC: 2.26 10*6/uL — ABNORMAL LOW (ref 3.70–5.45)
RDW: 17.6 % — ABNORMAL HIGH (ref 11.2–14.5)
WBC: 2.2 10*3/uL — ABNORMAL LOW (ref 3.9–10.3)
lymph#: 0.8 10*3/uL — ABNORMAL LOW (ref 0.9–3.3)
nRBC: 0 % (ref 0–0)

## 2011-11-22 ENCOUNTER — Telehealth: Payer: Self-pay | Admitting: *Deleted

## 2011-11-22 NOTE — Telephone Encounter (Signed)
Asking if counts were adequate enough to begin next cycle of melphalan and prednisone? Results on MD desk for review.

## 2011-11-23 ENCOUNTER — Other Ambulatory Visit: Payer: Self-pay | Admitting: *Deleted

## 2011-11-23 DIAGNOSIS — C9001 Multiple myeloma in remission: Secondary | ICD-10-CM

## 2011-11-23 MED ORDER — MELPHALAN 2 MG PO TABS: 6.0000 mg | ORAL_TABLET | Freq: Every day | ORAL | Status: DC

## 2011-11-23 MED ORDER — PREDNISONE 20 MG PO TABS: 80.0000 mg | ORAL_TABLET | Freq: Every day | ORAL | Status: DC

## 2011-11-23 NOTE — Telephone Encounter (Signed)
Called pt's husband. CBC is adequate to start next cycle of Melphalan and Prednisone. Rx sent electronically to local pharmacy per Mr. Kinderman's request. Cancelled electronic rx that was sent to pt's mail order pharmacy.

## 2011-11-24 ENCOUNTER — Telehealth: Payer: Self-pay | Admitting: *Deleted

## 2011-11-24 DIAGNOSIS — C9001 Multiple myeloma in remission: Secondary | ICD-10-CM

## 2011-11-24 NOTE — Telephone Encounter (Signed)
Patient was called regarding labs: She reports RN called last night--started her M/P today.

## 2011-11-24 NOTE — Telephone Encounter (Signed)
Message copied by Wandalee Ferdinand on Fri Nov 24, 2011  4:15 PM ------      Message from: Thornton Papas B      Created: Wed Nov 22, 2011  8:48 PM       Please call patient, anc, plts are stable, ok to start next cycle of M/P.  Call for fever, symptomatic anemia, check cbc 2/28 as scheduled, add type and hold

## 2011-12-05 ENCOUNTER — Telehealth: Payer: Self-pay | Admitting: Internal Medicine

## 2011-12-05 MED ORDER — ESOMEPRAZOLE MAGNESIUM 20 MG PO CPDR: 20.0000 mg | DELAYED_RELEASE_CAPSULE | Freq: Every day | ORAL | Status: DC

## 2011-12-05 MED ORDER — SIMVASTATIN 20 MG PO TABS: 20.0000 mg | ORAL_TABLET | Freq: Every day | ORAL | Status: DC

## 2011-12-05 NOTE — Telephone Encounter (Signed)
RX's sent, patient needs to schedule a CPX with fasting labs

## 2011-12-05 NOTE — Telephone Encounter (Signed)
Patient called & stated she calls here & we send [facility]ill [facility]. Please refill below: simvastatin (ZOCOR) 20 MG tablet [[id number]] & esomeprazole (NEXIUM) 20 MG capsule [[id number]] Thank You  Judeth Cornfield

## 2011-12-07 ENCOUNTER — Other Ambulatory Visit (HOSPITAL_BASED_OUTPATIENT_CLINIC_OR_DEPARTMENT_OTHER): Payer: Medicare Other | Admitting: Lab

## 2011-12-07 DIAGNOSIS — C9001 Multiple myeloma in remission: Secondary | ICD-10-CM

## 2011-12-07 LAB — CBC WITH DIFFERENTIAL/PLATELET
BASO%: 0 % (ref 0.0–2.0)
Eosinophils Absolute: 0 10*3/uL (ref 0.0–0.5)
LYMPH%: 36.3 % (ref 14.0–49.7)
MCHC: 34.1 g/dL (ref 31.5–36.0)
MCV: 106 fL — ABNORMAL HIGH (ref 79.5–101.0)
MONO#: 0.2 10*3/uL (ref 0.1–0.9)
MONO%: 10.8 % (ref 0.0–14.0)
NEUT#: 1.1 10*3/uL — ABNORMAL LOW (ref 1.5–6.5)
RBC: 2.16 10*6/uL — ABNORMAL LOW (ref 3.70–5.45)
RDW: 16.5 % — ABNORMAL HIGH (ref 11.2–14.5)
WBC: 2 10*3/uL — ABNORMAL LOW (ref 3.9–10.3)
nRBC: 0 % (ref 0–0)

## 2011-12-07 LAB — HOLD TUBE, BLOOD BANK

## 2011-12-11 ENCOUNTER — Telehealth: Payer: Self-pay | Admitting: *Deleted

## 2011-12-11 NOTE — Telephone Encounter (Signed)
Patient left VM requesting antibiotic for "terrible cold".

## 2011-12-11 NOTE — Telephone Encounter (Signed)
Spoke with patient: Has "chest cold" past 3 days with green sputum production. Feels some wheezing in chest. No dyspnea. Temp today is 98.5. Counts 2/28 ANC 1.1, WBC 2.0. Finished Melphalan & Prednisone cycle on 11/27/11. Not taking any OTC medications except Tylenol because she is allergic to guaifenesin. Trying to push po fluids, but says "I'm not one to drink much. I try".

## 2011-12-11 NOTE — Telephone Encounter (Signed)
Per Lonna Cobb, NP : Push po fluids, take OTC Tylenol prn. Call for fever or dyspnea. No antibiotic at this time Patient notified.

## 2011-12-13 ENCOUNTER — Encounter: Payer: Self-pay | Admitting: Family Medicine

## 2011-12-13 ENCOUNTER — Ambulatory Visit (INDEPENDENT_AMBULATORY_CARE_PROVIDER_SITE_OTHER): Payer: Medicare Other | Admitting: Family Medicine

## 2011-12-13 ENCOUNTER — Telehealth: Payer: Self-pay | Admitting: *Deleted

## 2011-12-13 VITALS — BP 114/65 | HR 100 | Temp 99.1°F | Ht 61.0 in | Wt 163.8 lb

## 2011-12-13 DIAGNOSIS — J40 Bronchitis, not specified as acute or chronic: Secondary | ICD-10-CM

## 2011-12-13 DIAGNOSIS — D709 Neutropenia, unspecified: Secondary | ICD-10-CM

## 2011-12-13 DIAGNOSIS — J019 Acute sinusitis, unspecified: Secondary | ICD-10-CM

## 2011-12-13 DIAGNOSIS — R062 Wheezing: Secondary | ICD-10-CM

## 2011-12-13 DIAGNOSIS — R5081 Fever presenting with conditions classified elsewhere: Secondary | ICD-10-CM

## 2011-12-13 DIAGNOSIS — J441 Chronic obstructive pulmonary disease with (acute) exacerbation: Secondary | ICD-10-CM

## 2011-12-13 LAB — CBC WITH DIFFERENTIAL/PLATELET
Hemoglobin: 8.3 g/dL — ABNORMAL LOW (ref 12.0–15.0)
MCHC: 34 g/dL (ref 30.0–36.0)
RDW: 18.3 % — ABNORMAL HIGH (ref 11.5–14.6)

## 2011-12-13 MED ORDER — ALBUTEROL SULFATE (5 MG/ML) 0.5% IN NEBU
2.5000 mg | INHALATION_SOLUTION | Freq: Once | RESPIRATORY_TRACT | Status: AC
  Administered 2011-12-13: 2.5 mg via RESPIRATORY_TRACT

## 2011-12-13 MED ORDER — PROMETHAZINE-DM 6.25-15 MG/5ML PO SYRP: 5.0000 mL | ORAL_SOLUTION | Freq: Four times a day (QID) | ORAL | Status: AC | PRN

## 2011-12-13 MED ORDER — DOXYCYCLINE HYCLATE 100 MG PO TABS: 100.0000 mg | ORAL_TABLET | Freq: Two times a day (BID) | ORAL | Status: AC

## 2011-12-13 MED ORDER — ALBUTEROL SULFATE HFA 108 (90 BASE) MCG/ACT IN AERS: 2.0000 | INHALATION_SPRAY | Freq: Four times a day (QID) | RESPIRATORY_TRACT | Status: DC | PRN

## 2011-12-13 MED ORDER — IPRATROPIUM BROMIDE 0.02 % IN SOLN
0.5000 mg | Freq: Once | RESPIRATORY_TRACT | Status: AC
  Administered 2011-12-13: 0.5 mg via RESPIRATORY_TRACT

## 2011-12-13 NOTE — Assessment & Plan Note (Signed)
Severe.  Wheezing improved s/p neb tx.  Inhaler given for pt to use.  Start Doxy.  Will hold on CXR at this time as Doxy should provide broad spectrum coverage.  Cough med prn.  Reviewed supportive care and red flags that should prompt return.  Discussed ER evaluation if worsening SOB.  Pt and husband in agreement.  Will fax note and lab results to Dr Truett Perna.

## 2011-12-13 NOTE — Telephone Encounter (Signed)
Patient will see Dr.Tabori today at 11:30am

## 2011-12-13 NOTE — Progress Notes (Signed)
  Subjective:    Patient ID: Karen Barron, female    DOB: October 16, 1925, 78 y.o.   MRN: [id number]  HPI URI- sxs started on Saturday, had chemo 2-3 weeks ago for myeloma.  + cough- productive.  Some SOB and wheezing.  + nasal congestion w/ green drainage.  + facial pain.  No ear pain.  + fever- Tm 101.5 this AM.  + sick contacts.  No N/V/D.  No dysuria.  Told sxs to Dr Kalman Drape NP on Monday but she was afebrile at that time.   Review of Systems For ROS see HPI     Objective:   Physical Exam  Constitutional: She appears well-developed and well-nourished. No distress.  HENT:  Head: Normocephalic and atraumatic.  Right Ear: Tympanic membrane normal.  Left Ear: Tympanic membrane normal.  Nose: Mucosal edema and rhinorrhea present. Right sinus exhibits maxillary sinus tenderness and frontal sinus tenderness. Left sinus exhibits maxillary sinus tenderness and frontal sinus tenderness.  Mouth/Throat: Uvula is midline and mucous membranes are normal. Posterior oropharyngeal erythema present. No oropharyngeal exudate.  Eyes: Conjunctivae and EOM are normal. Pupils are equal, round, and reactive to light.  Neck: Normal range of motion. Neck supple.  Cardiovascular: Normal rate, regular rhythm and normal heart sounds.   Pulmonary/Chest: Effort normal. No respiratory distress. She has wheezes (improved s/p neb). She has rales.       + hacking cough  Lymphadenopathy:    She has no cervical adenopathy.          Assessment & Plan:

## 2011-12-13 NOTE — Assessment & Plan Note (Signed)
Pt's sxs and PE consistent w/ infxn.  Start Doxy.  Add Mucinex.  Reviewed supportive care and red flags that should prompt return.  Pt expressed understanding and is in agreement w/ plan.

## 2011-12-13 NOTE — Telephone Encounter (Signed)
Noted  

## 2011-12-13 NOTE — Telephone Encounter (Signed)
[facility] Triage Call Report Triage Record Num: [id number] Operator: Di Kindle Patient Name: Karen Barron Call Date & Time: 12/13/2011 8:30:32AM Patient Phone: [phone] PCP: Marga Melnick Patient Gender: Female PCP Fax : [phone] Patient DOB: 1926/05/10 Practice Name: [facility] Reason for Call: Caller: Curtis/Spouse; PCP: Marga Melnick; CB#: [phone]; Call regarding Cough/Congestion; calling for appt for cold symptoms with fever, no appt on WEB schedule, caller to office: [place]. Protocol(s) Used: Office Note Recommended Outcome per Protocol: Information Noted and Sent to Office Reason for Outcome: Caller information to office Care Advice:

## 2011-12-13 NOTE — Telephone Encounter (Signed)
Called pt left vm to call office, also called  daughter to ask if she has contacted her cancer MD per there are certain protocols to follow per fever after chemo treatment, daughter advised that pt takes oral chemo and this was taken three weeks ago, pt current sxs are fever and chest congestion, however pt daughter wanted to advise that the pt labs from last week noted low WBC and low hemoglobin of 7.85 and she was advised if she started to feel bad to call her cancer MD, however the cancer MD is on vacation, pt husband may or may not have the lab work with him, pt noted to be on way to our office currently per daughter noted she passed them on her way to MD apt herself.

## 2011-12-13 NOTE — Patient Instructions (Signed)
This appears to be a bronchitis and sinus infection Start the Doxycycline twice daily w/ food Use the inhaler- 2 puffs every 4 hrs- as needed for wheezing Use the cough syrup as needed- will make you drowsy Add Mucinex to thin your congestion REST! Call with any questions or concerns Hang in there!!!

## 2011-12-13 NOTE — Assessment & Plan Note (Signed)
New.  Pt w/ obvious URI.  No GI or GU sxs.  Start Doxy for broad spectrum coverage.  Repeat CBC.  Fax copy of OV and labs to Dr Truett Perna.

## 2011-12-19 ENCOUNTER — Ambulatory Visit (HOSPITAL_BASED_OUTPATIENT_CLINIC_OR_DEPARTMENT_OTHER): Payer: Medicare Other | Admitting: Nurse Practitioner

## 2011-12-19 ENCOUNTER — Other Ambulatory Visit (HOSPITAL_BASED_OUTPATIENT_CLINIC_OR_DEPARTMENT_OTHER): Payer: Medicare Other | Admitting: Lab

## 2011-12-19 ENCOUNTER — Ambulatory Visit (HOSPITAL_COMMUNITY)
Admission: RE | Admit: 2011-12-19 | Discharge: 2011-12-19 | Disposition: A | Discharge status: Home / Self Care | Payer: Medicare Other | Source: Ambulatory Visit | Attending: Nurse Practitioner | Admitting: Nurse Practitioner

## 2011-12-19 ENCOUNTER — Other Ambulatory Visit: Payer: Self-pay | Admitting: Nurse Practitioner

## 2011-12-19 ENCOUNTER — Other Ambulatory Visit: Payer: Self-pay | Admitting: *Deleted

## 2011-12-19 ENCOUNTER — Other Ambulatory Visit: Payer: Self-pay | Admitting: Emergency Medicine

## 2011-12-19 ENCOUNTER — Telehealth: Payer: Self-pay | Admitting: Oncology

## 2011-12-19 ENCOUNTER — Ambulatory Visit (HOSPITAL_BASED_OUTPATIENT_CLINIC_OR_DEPARTMENT_OTHER): Payer: Medicare Other

## 2011-12-19 VITALS — BP 115/58 | HR 100 | Temp 97.4°F | Ht 61.0 in | Wt 163.1 lb

## 2011-12-19 DIAGNOSIS — R062 Wheezing: Secondary | ICD-10-CM | POA: Insufficient documentation

## 2011-12-19 DIAGNOSIS — J40 Bronchitis, not specified as acute or chronic: Secondary | ICD-10-CM

## 2011-12-19 DIAGNOSIS — C9001 Multiple myeloma in remission: Secondary | ICD-10-CM

## 2011-12-19 DIAGNOSIS — D696 Thrombocytopenia, unspecified: Secondary | ICD-10-CM

## 2011-12-19 DIAGNOSIS — R630 Anorexia: Secondary | ICD-10-CM | POA: Insufficient documentation

## 2011-12-19 DIAGNOSIS — C9000 Multiple myeloma not having achieved remission: Secondary | ICD-10-CM | POA: Insufficient documentation

## 2011-12-19 DIAGNOSIS — R059 Cough, unspecified: Secondary | ICD-10-CM | POA: Insufficient documentation

## 2011-12-19 DIAGNOSIS — R05 Cough: Secondary | ICD-10-CM | POA: Insufficient documentation

## 2011-12-19 DIAGNOSIS — Z452 Encounter for adjustment and management of vascular access device: Secondary | ICD-10-CM

## 2011-12-19 DIAGNOSIS — Z96619 Presence of unspecified artificial shoulder joint: Secondary | ICD-10-CM | POA: Insufficient documentation

## 2011-12-19 DIAGNOSIS — D649 Anemia, unspecified: Secondary | ICD-10-CM

## 2011-12-19 DIAGNOSIS — M899 Disorder of bone, unspecified: Secondary | ICD-10-CM | POA: Insufficient documentation

## 2011-12-19 LAB — CBC WITH DIFFERENTIAL/PLATELET
BASO%: 0 % (ref 0.0–2.0)
Basophils Absolute: 0 10e3/uL (ref 0.0–0.1)
EOS%: 1.1 % (ref 0.0–7.0)
Eosinophils Absolute: 0 10e3/uL (ref 0.0–0.5)
HCT: 22.5 % — ABNORMAL LOW (ref 34.8–46.6)
HGB: 7.8 g/dL — ABNORMAL LOW (ref 11.6–15.9)
LYMPH%: 34.5 % (ref 14.0–49.7)
MCH: 37.1 pg — ABNORMAL HIGH (ref 25.1–34.0)
MCHC: 34.7 g/dL (ref 31.5–36.0)
MCV: 107.1 fL — ABNORMAL HIGH (ref 79.5–101.0)
MONO#: 0.4 10e3/uL (ref 0.1–0.9)
MONO%: 13.3 % (ref 0.0–14.0)
NEUT#: 1.4 10e3/uL — ABNORMAL LOW (ref 1.5–6.5)
NEUT%: 51.1 % (ref 38.4–76.8)
Platelets: 96 10e3/uL — ABNORMAL LOW (ref 145–400)
RBC: 2.1 10e6/uL — ABNORMAL LOW (ref 3.70–5.45)
RDW: 15.8 % — ABNORMAL HIGH (ref 11.2–14.5)
WBC: 2.8 10e3/uL — ABNORMAL LOW (ref 3.9–10.3)
lymph#: 1 10e3/uL (ref 0.9–3.3)

## 2011-12-19 LAB — TECHNOLOGIST REVIEW

## 2011-12-19 MED ORDER — SODIUM CHLORIDE 0.9 % IJ SOLN
10.0000 mL | INTRAMUSCULAR | Status: DC | PRN
  Administered 2011-12-19: 10 mL via INTRAVENOUS
  Filled 2011-12-19: qty 10

## 2011-12-19 MED ORDER — PREDNISONE 20 MG PO TABS
80.0000 mg | ORAL_TABLET | Freq: Every day | ORAL | Status: DC
Start: 2011-12-19 — End: 2012-02-01

## 2011-12-19 MED ORDER — HEPARIN SOD (PORK) LOCK FLUSH 100 UNIT/ML IV SOLN
500.0000 [IU] | Freq: Once | INTRAVENOUS | Status: AC
  Administered 2011-12-19: 500 [IU] via INTRAVENOUS
  Filled 2011-12-19: qty 5

## 2011-12-19 MED ORDER — PREDNISONE 20 MG PO TABS: 80.0000 mg | ORAL_TABLET | Freq: Every day | ORAL | Status: DC

## 2011-12-19 MED ORDER — MELPHALAN 2 MG PO TABS: 6.0000 mg | ORAL_TABLET | Freq: Every day | ORAL | Status: DC

## 2011-12-19 NOTE — Telephone Encounter (Signed)
Melphalan/Prednisone scripts sent to [facility] in error--voided and sent to her local [facility] as indicated

## 2011-12-19 NOTE — Telephone Encounter (Signed)
Gave pt calndar for April 2013, lab and MD, also got a wheelchair and gave daughter directions to Radiology @ [facility] for chest x-ray

## 2011-12-19 NOTE — Progress Notes (Signed)
OFFICE PROGRESS NOTE  Interval history:  Karen Barron is an 78 year old woman with multiple myeloma. She completed cycle 5 melphalan/prednisone beginning to 15 2013. She is seen today for scheduled followup.  Ms. Kawasaki reports persistent cold symptoms with coughing and wheezing. She has intermittent dyspnea. She had a single fever approximately 1 week ago. She was seen by her primary care provider last week and started on doxycycline, an albuterol inhaler, Mucinex and cough syrup. She has noted minimal improvement in her symptoms. Her appetite is poor.   Objective: Blood pressure 115/58, pulse 100, temperature 97.4 F (36.3 C), temperature source Oral, height 5\' 1"  (1.549 m), weight 163 lb 1.6 oz (73.982 kg).  Oropharynx is without thrush. Faint wheezes posterior lung fields. Anteriorly the wheezing is more pronounced. No respiratory distress. Regular cardiac rhythm. Abdomen soft and nontender. No organomegaly. Extremities are without edema.  Lab Results: Lab Results  Component Value Date   WBC 2.8* 12/19/2011   HGB 7.8* 12/19/2011   HCT 22.5* 12/19/2011   MCV 107.1* 12/19/2011   PLT 96* 12/19/2011    Chemistry:    Chemistry      Component Value Date/Time   NA 141 11/15/2011 0915   K 3.7 11/15/2011 0915   CL 104 11/15/2011 0915   CO2 25 11/15/2011 0915   BUN 11 11/15/2011 0915   CREATININE 0.97 11/15/2011 0915   CREATININE 1.08 10/18/2006 1023      Component Value Date/Time   CALCIUM 9.2 11/15/2011 0915   ALKPHOS 36* 11/15/2011 0915   AST 16 11/15/2011 0915   ALT 11 11/15/2011 0915   BILITOT 0.9 11/15/2011 0915       Studies/Results: No results found.  Medications: I have reviewed the patient's current medications.  Assessment/Plan:  1. Multiple myeloma treated with subcutaneous Velcade and Cytoxan/Decadron on a weekly schedule beginning in May 2011. She completed 1 year of treatment on 01/24/2011. The serum M-spike and IgM level were increased beginning in May 2012. She began treatment with  carfilzomib on 05/25/2011. She completed 2 cycles. The serum M-spike and IgM level were higher on 07/12/2011. She began cycle 1 melphalan/prednisone 07/25/2011. The IgM level was improved on 08/21/2011. She began cycle 2 melphalan/prednisone on 08/29/2011. The melphalan dose was reduced to 8 mg daily for 4 days beginning with cycle 2. The melphalan was dose reduced to 6 mg daily for 4 days beginning with the third cycle of melphalan/prednisone on 09/27/2011. She completed a fourth cycle of melphalan/prednisone beginning on 10/25/2011. The IgM level was stable on 11/15/2011. She completed cycle 5 melphalan/prednisone beginning 11/24/2011. 2. Hospitalization with dyspnea/hypoxia secondary to volume overload in the setting of diastolic heart failure on 05/26/2011. 3. Hospitalization 06/02/2011 with dyspnea. She was found to have marked anemia with a hemoglobin of 6.7. She received a red cell transfusion 09/27/2011. 4. History of congestive heart failure status post hospital admission 05/26/2011. 5. Thrombocytopenia secondary to multiple myeloma, Velcade/Cytoxan and carfilzomib.  6. T7 compression fracture status post kyphoplasty 11/23/2007 by Dr. Noel Gerold. 7. Peripheral neuropathy status post evaluation by Dr. Anne Hahn. The neuropathy is most likely related to Velcade therapy. 8. Herpes zoster at the left abdomen and chest wall status post Valtrex therapy. 9. Postherpetic neuralgia. 10. History of neutropenia secondary to Velcade and Cytoxan. There is persistent neutropenia, likely related to multiple myeloma and melphalan-stable 11. Pain and swelling of the proximal interphalangeal joint of the right 3rd finger when she was here on 02/21/2011, improved. She was treated with a Medrol Dosepak. 12. Cough, wheezing,  dyspnea. She was recently diagnosed with bronchitis. She is completing a course of doxycycline. Symptoms have not improved significantly. We are referring her for a chest x-ray to rule out  pneumonia. 13. Persistent severe anemia. She was last transfused with packed red blood cells on 09/26/2012.  Disposition-refer for chest x-ray as above. We will delay cycle 6 melphalan/prednisone for one week with plans for her to begin on 12/29/2011. She will return for a CBC on 01/09/2012 and a followup visit with Dr. Truett Perna on 01/22/2012. She will contact the office in the interim with any problems. Port-A-Cath is being flushed today.  Plan reviewed with Dr. Truett Perna.     Lonna Cobb ANP/GNP-BC

## 2011-12-21 ENCOUNTER — Telehealth: Payer: Self-pay | Admitting: *Deleted

## 2011-12-21 NOTE — Telephone Encounter (Signed)
Called pt's husband with CXRay results. Does not show any pneumonia. Mr. Hardiman verbalized understanding.

## 2011-12-22 LAB — BASIC METABOLIC PANEL
BUN: 19 mg/dL (ref 6–23)
Chloride: 104 mEq/L (ref 96–112)
Creatinine, Ser: 1.01 mg/dL (ref 0.50–1.10)
Glucose, Bld: 155 mg/dL — ABNORMAL HIGH (ref 70–99)

## 2011-12-22 LAB — PROTEIN ELECTROPHORESIS, SERUM
Beta Globulin: 3.6 % — ABNORMAL LOW (ref 4.7–7.2)
Gamma Globulin: 26.9 % — ABNORMAL HIGH (ref 11.1–18.8)
M-Spike, %: 1.8 g/dL
Total Protein, Serum Electrophoresis: 7.6 g/dL (ref 6.0–8.3)

## 2011-12-22 LAB — IGM: IgM, Serum: 3050 mg/dL — ABNORMAL HIGH (ref 52–322)

## 2011-12-26 ENCOUNTER — Telehealth: Payer: Self-pay | Admitting: *Deleted

## 2011-12-26 NOTE — Telephone Encounter (Signed)
Notified husband of stable IgM level. Continue melphalan & prednisone as scheduled. She begins next cycle on 12/29/11.

## 2011-12-26 NOTE — Telephone Encounter (Signed)
Message copied by Wandalee Ferdinand on Tue Dec 26, 2011  6:56 PM ------      Message from: Ladene Artist      Created: Sat Dec 23, 2011 10:36 AM       Please call patient,IgM stable, cont. MP as scheduled

## 2012-01-22 ENCOUNTER — Ambulatory Visit (HOSPITAL_BASED_OUTPATIENT_CLINIC_OR_DEPARTMENT_OTHER): Payer: Medicare Other

## 2012-01-22 ENCOUNTER — Other Ambulatory Visit: Payer: Self-pay | Admitting: *Deleted

## 2012-01-22 ENCOUNTER — Encounter (HOSPITAL_COMMUNITY)
Admission: RE | Admit: 2012-01-22 | Discharge: 2012-01-22 | Disposition: A | Discharge status: Home / Self Care | Payer: Medicare Other | Source: Ambulatory Visit | Attending: Oncology | Admitting: Oncology

## 2012-01-22 ENCOUNTER — Ambulatory Visit (HOSPITAL_BASED_OUTPATIENT_CLINIC_OR_DEPARTMENT_OTHER): Payer: Medicare Other | Admitting: Lab

## 2012-01-22 ENCOUNTER — Other Ambulatory Visit (HOSPITAL_BASED_OUTPATIENT_CLINIC_OR_DEPARTMENT_OTHER): Payer: Medicare Other | Admitting: Lab

## 2012-01-22 ENCOUNTER — Ambulatory Visit (HOSPITAL_COMMUNITY)
Admission: RE | Admit: 2012-01-22 | Discharge: 2012-01-22 | Disposition: A | Discharge status: Home / Self Care | Payer: Medicare Other | Source: Ambulatory Visit | Attending: Oncology | Admitting: Oncology

## 2012-01-22 ENCOUNTER — Telehealth: Payer: Self-pay | Admitting: Oncology

## 2012-01-22 ENCOUNTER — Ambulatory Visit (HOSPITAL_BASED_OUTPATIENT_CLINIC_OR_DEPARTMENT_OTHER): Payer: Medicare Other | Admitting: Oncology

## 2012-01-22 ENCOUNTER — Other Ambulatory Visit: Payer: Self-pay | Admitting: Oncology

## 2012-01-22 VITALS — BP 113/69 | HR 90 | Temp 98.5°F | Resp 18

## 2012-01-22 DIAGNOSIS — D649 Anemia, unspecified: Secondary | ICD-10-CM | POA: Insufficient documentation

## 2012-01-22 DIAGNOSIS — C9000 Multiple myeloma not having achieved remission: Secondary | ICD-10-CM

## 2012-01-22 DIAGNOSIS — J189 Pneumonia, unspecified organism: Secondary | ICD-10-CM | POA: Insufficient documentation

## 2012-01-22 DIAGNOSIS — J90 Pleural effusion, not elsewhere classified: Secondary | ICD-10-CM | POA: Insufficient documentation

## 2012-01-22 DIAGNOSIS — C9001 Multiple myeloma in remission: Secondary | ICD-10-CM

## 2012-01-22 DIAGNOSIS — C888 Other malignant immunoproliferative diseases: Secondary | ICD-10-CM

## 2012-01-22 LAB — CBC WITH DIFFERENTIAL/PLATELET
Basophils Absolute: 0 10*3/uL (ref 0.0–0.1)
Eosinophils Absolute: 0 10*3/uL (ref 0.0–0.5)
HGB: 6.3 g/dL — CL (ref 11.6–15.9)
MCV: 110.5 fL — ABNORMAL HIGH (ref 79.5–101.0)
MONO#: 1 10*3/uL — ABNORMAL HIGH (ref 0.1–0.9)
MONO%: 21.5 % — ABNORMAL HIGH (ref 0.0–14.0)
NEUT#: 2.7 10*3/uL (ref 1.5–6.5)
Platelets: 77 10*3/uL — ABNORMAL LOW (ref 145–400)
RDW: 15.4 % — ABNORMAL HIGH (ref 11.2–14.5)
WBC: 4.5 10*3/uL (ref 3.9–10.3)

## 2012-01-22 LAB — BASIC METABOLIC PANEL - CANCER CENTER ONLY
CO2: 28 mEq/L (ref 18–33)
Chloride: 98 mEq/L (ref 98–108)
Potassium: 3.5 mEq/L (ref 3.3–4.7)
Sodium: 136 mEq/L (ref 128–145)

## 2012-01-22 LAB — PREPARE RBC (CROSSMATCH)

## 2012-01-22 LAB — TECHNOLOGIST REVIEW

## 2012-01-22 MED ORDER — HYDROMORPHONE HCL 4 MG PO TABS: 4.0000 mg | ORAL_TABLET | Freq: Four times a day (QID) | ORAL | Status: DC | PRN

## 2012-01-22 MED ORDER — PROCHLORPERAZINE MALEATE 10 MG PO TABS: 10.0000 mg | ORAL_TABLET | Freq: Four times a day (QID) | ORAL | Status: DC | PRN

## 2012-01-22 MED ORDER — HEPARIN SOD (PORK) LOCK FLUSH 100 UNIT/ML IV SOLN
500.0000 [IU] | Freq: Every day | INTRAVENOUS | Status: AC | PRN
  Administered 2012-01-22: 500 [IU]
  Filled 2012-01-22: qty 5

## 2012-01-22 MED ORDER — ZOLEDRONIC ACID 4 MG/100ML IV SOLN
4.0000 mg | Freq: Once | INTRAVENOUS | Status: AC
  Administered 2012-01-22: 4 mg via INTRAVENOUS
  Filled 2012-01-22: qty 100

## 2012-01-22 MED ORDER — MOXIFLOXACIN HCL 400 MG PO TABS: 400.0000 mg | ORAL_TABLET | Freq: Every day | ORAL | Status: DC

## 2012-01-22 MED ORDER — SODIUM CHLORIDE 0.9 % IV SOLN: 250.0000 mL | Freq: Once | INTRAVENOUS | Status: DC

## 2012-01-22 MED ORDER — FUROSEMIDE 10 MG/ML IJ SOLN
20.0000 mg | Freq: Once | INTRAMUSCULAR | Status: AC
  Administered 2012-01-22: 20 mg via INTRAVENOUS

## 2012-01-22 MED ORDER — IOHEXOL 300 MG/ML  SOLN
80.0000 mL | Freq: Once | INTRAMUSCULAR | Status: AC | PRN
  Administered 2012-01-22: 80 mL via INTRAVENOUS

## 2012-01-22 MED ORDER — SODIUM CHLORIDE 0.9 % IJ SOLN
10.0000 mL | INTRAMUSCULAR | Status: AC | PRN
  Administered 2012-01-22: 10 mL
  Filled 2012-01-22: qty 10

## 2012-01-22 NOTE — Telephone Encounter (Signed)
Gv pt appt for april2013.  scheduled pt for ct scan on 04/16 @ [facility]

## 2012-01-22 NOTE — Progress Notes (Signed)
Patient does not recall what her "allergy" to risedronate was. She has received Zometa in office in past. Dr. Truett Perna wants her to receive dose of Zometa today.

## 2012-01-22 NOTE — Progress Notes (Signed)
OFFICE PROGRESS NOTE   INTERVAL HISTORY:   She returns as scheduled. She complains of a cough for the past one week. She has developed pleuritic right low anterior chest discomfort for the past few days. Stable back and and postherpetic neuralgia pain. She completed the last cycle of melphalan and prednisone beginning on 12/29/2011.  Objective:  Vital signs in last 24 hours:  Blood pressure 112/62, pulse 104, temperature 98.9 F (37.2 C), temperature source Oral, height 5\' 1"  (1.549 m), weight 165 lb 6.4 oz (75.025 kg).    HEENT: No thrush or ulcers Resp: Expiratory wheeze at the right chest with and and inspiratory rub at the right upper posterior chest. Rhonchi at the right base and right low anterior chest. No respiratory distress. Cardio: Regular rate and rhythm GI: Nontender, no hepatomegaly Vascular: No leg edema  Portacath/PICC-without erythema  Lab Results:  Lab Results  Component Value Date   WBC 4.5 01/22/2012   HGB 6.3* 01/22/2012   HCT 18.9* 01/22/2012   MCV 110.5* 01/22/2012   PLT 77* 01/22/2012   ANC 2.7  IgM 3050 on 12/19/2011  Serum M spike 1.8 on 12/19/2011    Medications: I have reviewed the patient's current medications.  Assessment/Plan: 1. Multiple myeloma treated with subcutaneous Velcade and Cytoxan/Decadron on a weekly schedule beginning in May 2011. She completed 1 year of treatment on 01/24/2011. The serum M-spike and IgM level were increased beginning in May 2012. She began treatment with carfilzomib on 05/25/2011. She completed 2 cycles. The serum M-spike and IgM level were higher on 07/12/2011. She began cycle 1 melphalan/prednisone 07/25/2011. The IgM level was improved on 08/21/2011. She began cycle 2 melphalan/prednisone on 08/29/2011. The melphalan dose was reduced to 8 mg daily for 4 days beginning with cycle 2. The melphalan was dose reduced to 6 mg daily for 4 days beginning with the third cycle of melphalan/prednisone on 09/27/2011. She  completed a fourth cycle of melphalan/prednisone beginning on 10/25/2011. The IgM level was stable on 11/15/2011. She completed cycle 6 melphalan/prednisone beginning 12/29/2011. 2. Hospitalization with dyspnea/hypoxia secondary to volume overload in the setting of diastolic heart failure on 05/26/2011. 3. Hospitalization 06/02/2011 with dyspnea. She was found to have marked anemia with a hemoglobin of 6.7. She received a red cell transfusion 09/27/2011. 4. History of congestive heart failure status post hospital admission 05/26/2011. 5. Thrombocytopenia secondary to multiple myeloma, Velcade/Cytoxan and carfilzomib.  6. T7 compression fracture status post kyphoplasty 11/23/2007 by Dr. Noel Gerold. 7. Peripheral neuropathy status post evaluation by Dr. Anne Hahn. The neuropathy is most likely related to Velcade therapy. 8. Herpes zoster at the left abdomen and chest wall status post Valtrex therapy. 9. Postherpetic neuralgia. 10. History of neutropenia secondary to Velcade and Cytoxan. There is persistent neutropenia, likely related to multiple myeloma and melphalan-stable 11. Pain and swelling of the proximal interphalangeal joint of the right 3rd finger when she was here on 02/21/2011, improved. She was treated with a Medrol Dosepak. 12. Cough, wheezing, dyspnea, and pleuritic right-sided chest pain-she will be referred for a CT of the chest to rule out a pulmonary embolism or pneumonia. 13. Persistent severe anemia. We will arrange for a Red cell transfusion today  Disposition:  Ms. Boulter has a cough and pleuritic right-sided chest pain. She will be referred for a CT of the chest today. She will be scheduled for a red cell transfusion. The next cycle of melphalan and prednisone has been placed on hold. We obtained a followup IgM level and serum protein electrophoresis  todayLucile Shutters, MD  01/22/2012  2:07 PM

## 2012-01-22 NOTE — Patient Instructions (Signed)
Your CT scan shows that you do not have a pulmonary embolism (blood clot). You do have pneumonia in your right upper lung field. You need to begin Avelox 400 mg by mouth daily. Take 1st dose today.  Push po fluids and be sure to stay as active as possible and practice deep breathing and coughing to remove secretions. Use your inhaler as necessary. Call [phone] if condition worsens in any way.

## 2012-01-23 ENCOUNTER — Telehealth: Payer: Self-pay | Admitting: *Deleted

## 2012-01-23 ENCOUNTER — Other Ambulatory Visit (HOSPITAL_COMMUNITY): Payer: Medicare Other

## 2012-01-23 LAB — TYPE AND SCREEN: Unit division: 0

## 2012-01-23 MED ORDER — LEVOTHYROXINE SODIUM 75 MCG PO TABS: 75.0000 ug | ORAL_TABLET | Freq: Every day | ORAL | Status: DC

## 2012-01-23 MED ORDER — METOPROLOL SUCCINATE ER 50 MG PO TB24: 50.0000 mg | ORAL_TABLET | Freq: Every day | ORAL | Status: DC

## 2012-01-23 MED ORDER — ESOMEPRAZOLE MAGNESIUM 20 MG PO CPDR: 20.0000 mg | DELAYED_RELEASE_CAPSULE | Freq: Every day | ORAL | Status: DC

## 2012-01-23 NOTE — Telephone Encounter (Signed)
  Amitriptyline  Last filled 10-05-12 #180, last OV 12-13-11

## 2012-01-24 LAB — PROTEIN ELECTROPHORESIS, SERUM
Alpha-1-Globulin: 5.5 % — ABNORMAL HIGH (ref 2.9–4.9)
Alpha-2-Globulin: 11.6 % (ref 7.1–11.8)
Total Protein, Serum Electrophoresis: 8.3 g/dL (ref 6.0–8.3)

## 2012-01-24 LAB — IGM: IgM, Serum: 4050 mg/dL — ABNORMAL HIGH (ref 52–322)

## 2012-01-25 NOTE — Telephone Encounter (Signed)
Spoke with patient, patient informed to contact her cardiologist by phone and f/u on med, if they require an appointment, they will inform her

## 2012-01-25 NOTE — Telephone Encounter (Signed)
OK; please ask her to review this medication with her cardiologist to make sure he has no concern about this medication. Also see if one pill at bedtime insufficient.

## 2012-01-29 ENCOUNTER — Other Ambulatory Visit: Payer: Self-pay | Admitting: Cardiology

## 2012-01-29 ENCOUNTER — Telehealth: Payer: Self-pay | Admitting: *Deleted

## 2012-01-29 NOTE — Telephone Encounter (Signed)
Patient notified of labs--slightly higher. Hold chemo and follow up on 4/24 as scheduled.

## 2012-01-29 NOTE — Telephone Encounter (Signed)
Yes, patient was told to contact cardiology

## 2012-01-29 NOTE — Telephone Encounter (Signed)
Pt needs refill sent in Pharm confirmed

## 2012-01-29 NOTE — Telephone Encounter (Signed)
Wasn't this  addressed last week ?

## 2012-01-29 NOTE — Telephone Encounter (Signed)
Message copied by Wandalee Ferdinand on Mon Jan 29, 2012  6:18 PM ------      Message from: Thornton Papas B      Created: Wed Jan 24, 2012  8:50 PM       Please call patient, IgM and M-spike are slightly higher.  Hold Melphalan/prednisone and cont. Antibiotic for pneumonia

## 2012-01-31 ENCOUNTER — Telehealth: Payer: Self-pay | Admitting: Oncology

## 2012-01-31 ENCOUNTER — Other Ambulatory Visit (HOSPITAL_BASED_OUTPATIENT_CLINIC_OR_DEPARTMENT_OTHER): Payer: Medicare Other | Admitting: Lab

## 2012-01-31 ENCOUNTER — Encounter: Payer: Self-pay | Admitting: Gastroenterology

## 2012-01-31 ENCOUNTER — Ambulatory Visit (HOSPITAL_BASED_OUTPATIENT_CLINIC_OR_DEPARTMENT_OTHER): Payer: Medicare Other | Admitting: Nurse Practitioner

## 2012-01-31 VITALS — BP 115/57 | HR 93 | Temp 97.0°F | Ht 61.0 in | Wt 161.8 lb

## 2012-01-31 DIAGNOSIS — J189 Pneumonia, unspecified organism: Secondary | ICD-10-CM

## 2012-01-31 DIAGNOSIS — D630 Anemia in neoplastic disease: Secondary | ICD-10-CM

## 2012-01-31 DIAGNOSIS — R1013 Epigastric pain: Secondary | ICD-10-CM

## 2012-01-31 DIAGNOSIS — C9000 Multiple myeloma not having achieved remission: Secondary | ICD-10-CM

## 2012-01-31 DIAGNOSIS — C9001 Multiple myeloma in remission: Secondary | ICD-10-CM

## 2012-01-31 LAB — CBC WITH DIFFERENTIAL/PLATELET
Basophils Absolute: 0 10*3/uL (ref 0.0–0.1)
EOS%: 0.6 % (ref 0.0–7.0)
Eosinophils Absolute: 0 10*3/uL (ref 0.0–0.5)
HCT: 25.7 % — ABNORMAL LOW (ref 34.8–46.6)
HGB: 8.3 g/dL — ABNORMAL LOW (ref 11.6–15.9)
MCH: 33.3 pg (ref 25.1–34.0)
MCV: 103.2 fL — ABNORMAL HIGH (ref 79.5–101.0)
MONO%: 10.4 % (ref 0.0–14.0)
NEUT%: 53.7 % (ref 38.4–76.8)
Platelets: 59 10*3/uL — ABNORMAL LOW (ref 145–400)

## 2012-01-31 NOTE — Telephone Encounter (Signed)
Gv pt appt for [id number].   scheduled appt with Dr. Jarold Motto @ [facility] on 05/14 @ 9am

## 2012-01-31 NOTE — Progress Notes (Signed)
OFFICE PROGRESS NOTE  Interval history:  Karen Barron returns as scheduled. She completed cycle 6 melphalan/prednisone beginning 12/29/2011. The IgM level returned at 4050 on 01/22/2012 as compared to 3050 on 12/19/2011 and 2950 on 11/15/2011. The serum M spike returned at 2.32 on 01/22/2012 as compared to 1.8 on 12/19/2011. Melphalan and prednisone were placed on hold.  When Karen Barron was here on 01/22/2012 she complained of pleuritic right lower anterior chest discomfort and a cough. Chest CT was negative for pulmonary embolism. Right upper lobe pneumonia was noted as well as a small right pleural effusion. She was started on a 10 day course of Avelox.  Karen Barron reports improvement in the cough and chest discomfort. No fevers. She continues to be extremely weak. Her main complaint is intermittent abdominal pain typically in the epigastric region. She takes Nexium on a daily basis. She takes Zantac and Pepto-Bismol as needed. She notes improvement in the pain after Pepto-Bismol.   Objective: Blood pressure 115/57, pulse 93, temperature 97 F (36.1 C), temperature source Oral, height 5\' 1"  (1.549 m), weight 161 lb 12.8 oz (73.392 kg).  Oropharynx is without thrush. Lungs are clear. Regular cardiac rhythm. Port-A-Cath site is without erythema. Abdomen is soft. She is tender at the upper mid abdomen. No hepatomegaly. Extremities are without edema.  Lab Results: Lab Results  Component Value Date   WBC 3.5* 01/31/2012   HGB 8.3* 01/31/2012   HCT 25.7* 01/31/2012   MCV 103.2* 01/31/2012   PLT 59* 01/31/2012    Chemistry:    Chemistry      Component Value Date/Time   NA 136 01/22/2012 1118   NA 140 12/19/2011 1145   K 3.5 01/22/2012 1118   K 3.6 12/19/2011 1145   CL 98 01/22/2012 1118   CL 104 12/19/2011 1145   CO2 28 01/22/2012 1118   CO2 21 12/19/2011 1145   BUN 15 01/22/2012 1118   BUN 19 12/19/2011 1145   CREATININE 1.1 01/22/2012 1118   CREATININE 1.01 12/19/2011 1145   CREATININE 1.08 10/18/2006  1023      Component Value Date/Time   CALCIUM 8.3 01/22/2012 1118   CALCIUM 9.2 12/19/2011 1145   ALKPHOS 36* 11/15/2011 0915   AST 16 11/15/2011 0915   ALT 11 11/15/2011 0915   BILITOT 0.9 11/15/2011 0915       Studies/Results: Ct Angio Chest W/cm &/or Wo Cm  01/22/2012  *RADIOLOGY REPORT*  Clinical Data: Dyspnea, cough, myeloma, evaluate for pneumonia or pulmonary embolism  CT ANGIOGRAPHY CHEST  Technique:  Multidetector CT imaging of the chest using the standard protocol during bolus administration of intravenous contrast. Multiplanar reconstructed images including MIPs were obtained and reviewed to evaluate the vascular anatomy.  Contrast: 80mL OMNIPAQUE IOHEXOL 300 MG/ML  SOLN  Comparison: Chest radiograph dated 12/19/2011.  Findings: No evidence of pulmonary embolism.  High density material scattered throughout the lungs, for example linear high density within a subsegmental right lower lobe pulmonary artery (series 7/image 57), likely reflecting embolized vertebroplasty cement, unchanged.  Patchy opacity in the lateral right upper lobe, compatible with pneumonia.  Minimal patchy opacity in the lingula, likely atelectasis or scarring.  Small right pleural effusion.  No pneumothorax.  Visualized thyroid is unremarkable.  The heart is normal in size.  No pericardial effusion.  Mild coronary atherosclerosis.  Atherosclerotic calcifications of the aortic arch.  Right chest port.  No suspicious mediastinal, hilar, or axillary lymphadenopathy.  Visualized upper abdomen is unremarkable.  Prior vertebral augmentation at T7, T11,  and T12.   Moderate to severe compression deformity at L1, incompletely visualized.  Left shoulder arthroplasty.  Stable benign-appearing sclerotic lesion in the right humeral head (series 4/image 5).  IMPRESSION: No evidence of pulmonary embolism.  Right upper lobe pneumonia.  Small right pleural effusion.  Additional stable ancillary findings as above.  Original Report Authenticated By:  Charline Bills, M.D.    Medications: I have reviewed the patient's current medications.  Assessment/Plan:  1. Multiple myeloma treated with subcutaneous Velcade and Cytoxan/Decadron on a weekly schedule beginning in May 2011. She completed 1 year of treatment on 01/24/2011. The serum M-spike and IgM level were increased beginning in May 2012. She began treatment with carfilzomib on 05/25/2011. She completed 2 cycles. The serum M-spike and IgM level were higher on 07/12/2011. She began cycle 1 melphalan/prednisone 07/25/2011. The IgM level was improved on 08/21/2011. She began cycle 2 melphalan/prednisone on 08/29/2011. The melphalan dose was reduced to 8 mg daily for 4 days beginning with cycle 2. The melphalan was dose reduced to 6 mg daily for 4 days beginning with the third cycle of melphalan/prednisone on 09/27/2011. She completed a fourth cycle of melphalan/prednisone beginning on 10/25/2011. The IgM level was stable on 11/15/2011. She completed cycle 6 melphalan/prednisone beginning 12/29/2011. The IgM level was increased on 01/22/2012. 2. Hospitalization with dyspnea/hypoxia secondary to volume overload in the setting of diastolic heart failure on 05/26/2011. 3. Hospitalization 06/02/2011 with dyspnea. She was found to have marked anemia with a hemoglobin of 6.7. She received a red cell transfusion 09/27/2011. 4. History of congestive heart failure status post hospital admission 05/26/2011. 5. Thrombocytopenia secondary to multiple myeloma, Velcade/Cytoxan and carfilzomib.  6. T7 compression fracture status post kyphoplasty 11/23/2007 by Dr. Noel Gerold. 7. Peripheral neuropathy status post evaluation by Dr. Anne Hahn. The neuropathy is most likely related to Velcade therapy. 8. Herpes zoster at the left abdomen and chest wall status post Valtrex therapy. 9. Postherpetic neuralgia. 10. History of neutropenia secondary to Velcade and Cytoxan. There is persistent neutropenia, likely related to multiple  myeloma and melphalan. 11. Pain and swelling of the proximal interphalangeal joint of the right 3rd finger when she was here on 02/21/2011, improved. She was treated with a Medrol Dosepak. 12. Cough, wheezing, dyspnea, and pleuritic right-sided chest pain and here on 01/22/2012. Chest CT showed right upper lobe pneumonia. She is completing a 10 day course of Avelox. 13. Persistent severe anemia. She was transfused on 01/22/2012. 14. Abdominal pain, question epigastric. Dr. Truett Perna recommends a referral to gastroenterology.  Disposition-Karen Barron has completed 6 cycles of melphalan/prednisone. The IgM level was increased on 01/22/2012. Dr. Truett Perna recommends discontinuation of melphalan/prednisone. Dr. Truett Perna discussed options to include treatment with a different salvage regimen, a referral to [facility], a supportive care approach. She will remain off of treatment for now and return for a followup visit in 2-3 weeks. She will contact the office in the interim with any problems.  Patient seen with Dr. Truett Perna.    Lonna Cobb ANP/GNP-BC

## 2012-02-01 ENCOUNTER — Emergency Department (HOSPITAL_COMMUNITY): Payer: Medicare Other

## 2012-02-01 ENCOUNTER — Telehealth: Payer: Self-pay | Admitting: *Deleted

## 2012-02-01 ENCOUNTER — Emergency Department (HOSPITAL_COMMUNITY)
Admission: EM | Admit: 2012-02-01 | Discharge: 2012-02-01 | Disposition: A | Discharge status: Home / Self Care | Payer: Medicare Other | Attending: Emergency Medicine | Admitting: Emergency Medicine

## 2012-02-01 DIAGNOSIS — M25539 Pain in unspecified wrist: Secondary | ICD-10-CM | POA: Insufficient documentation

## 2012-02-01 DIAGNOSIS — M25519 Pain in unspecified shoulder: Secondary | ICD-10-CM | POA: Insufficient documentation

## 2012-02-01 DIAGNOSIS — M79622 Pain in left upper arm: Secondary | ICD-10-CM

## 2012-02-01 DIAGNOSIS — Z79899 Other long term (current) drug therapy: Secondary | ICD-10-CM | POA: Insufficient documentation

## 2012-02-01 DIAGNOSIS — M1990 Unspecified osteoarthritis, unspecified site: Secondary | ICD-10-CM | POA: Insufficient documentation

## 2012-02-01 DIAGNOSIS — J45909 Unspecified asthma, uncomplicated: Secondary | ICD-10-CM | POA: Insufficient documentation

## 2012-02-01 DIAGNOSIS — I10 Essential (primary) hypertension: Secondary | ICD-10-CM | POA: Insufficient documentation

## 2012-02-01 MED ORDER — HYDROMORPHONE HCL PF 1 MG/ML IJ SOLN
0.5000 mg | Freq: Once | INTRAMUSCULAR | Status: AC
  Administered 2012-02-01: 16:00:00 via INTRAVENOUS
  Filled 2012-02-01: qty 1

## 2012-02-01 MED ORDER — HYDROMORPHONE HCL PF 1 MG/ML IJ SOLN
1.0000 mg | Freq: Once | INTRAMUSCULAR | Status: AC
  Administered 2012-02-01: 1 mg via INTRAVENOUS
  Filled 2012-02-01: qty 1

## 2012-02-01 MED ORDER — ONDANSETRON 8 MG PO TBDP
ORAL_TABLET | ORAL | Status: AC
  Filled 2012-02-01: qty 1

## 2012-02-01 MED ORDER — ONDANSETRON 4 MG PO TBDP
ORAL_TABLET | ORAL | Status: AC
  Filled 2012-02-01: qty 1

## 2012-02-01 MED ORDER — ONDANSETRON 4 MG PO TBDP
4.0000 mg | ORAL_TABLET | Freq: Once | ORAL | Status: AC
  Administered 2012-02-01: 4 mg via ORAL

## 2012-02-01 MED ORDER — ONDANSETRON HCL 4 MG/2ML IJ SOLN
4.0000 mg | Freq: Once | INTRAMUSCULAR | Status: DC
  Filled 2012-02-01: qty 2

## 2012-02-01 NOTE — ED Provider Notes (Signed)
History     CSN: [id number]  Arrival date & time 02/01/12  1150   First MD Initiated Contact with Patient 02/01/12 1254      Chief Complaint  Patient presents with  . Arm Pain    Left arm. Unknown injury    (Consider location/radiation/quality/duration/timing/severity/associated sxs/prior treatment) HPI  78 year old female with history of degenerative joint disease, osteoporosis, and multiple myeloma in remission presents with a chief complaint of arm pain. Patient states she usually sleeps on the left side while laying on her hand. This morning she woke up with intense pain that started from her left shoulder and radiates down her entire arm. She described pain as a sharp stabbing sensation, constant, worsening with movement. Right now pain is primarily to the left wrist. Patient tried taking the at home pain medication, Dilaudid 4 mg, without adequate relief. States she has a history of IgM multiple myeloma, but states this pain felt different. She denies fever, chills, chest pain, shortness of breath, rash. She denies any recent trauma. She denies numbness or weakness.  Past Medical History  Diagnosis Date  . Colon polyps   . Osteoporosis   . Waldenstrom's macroglobulinemia   . Diverticular disease   . Thyroid disease   . Fatty liver   . Hypertension   . Diabetes in pregnancy   . Dyspnea on exertion   . Hyperlipidemia   . Edema   . Spinal stenosis   . Asthma   . Anemia   . Multiple myeloma in remission   . DJD (degenerative joint disease)     Past Surgical History  Procedure Date  . Total abdominal hysterectomy     due to fibroids  . Appendectomy   . Kyphosis surgery   . Cataract extraction   . Colonoscopy w/ polypectomy   . Total knee arthroplasty     bilateral  . Lumbar fusion   . Portacath placement 04/15/2009    Family History  Problem Relation Age of Onset  . Leukemia Father   . Osteoporosis Mother   . Cancer Sister     CNS  . Melanoma Sister      History  Substance Use Topics  . Smoking status: Never Smoker   . Smokeless tobacco: Not on file  . Alcohol Use: No    OB History    Grav Para Term Preterm Abortions TAB SAB Ect Mult Living                  Review of Systems  All other systems reviewed and are negative.    Allergies  Cladribine; Codeine; Guaifenesin & derivatives; Indomethacin; Naproxen sodium; and Risedronate sodium  Home Medications   Current Outpatient Rx  Name Route Sig Dispense Refill  . ALBUTEROL SULFATE HFA 108 (90 BASE) MCG/ACT IN AERS Inhalation Inhale 2 puffs into the lungs every 6 (six) hours as needed for wheezing. 1 Inhaler 0  . AMITRIPTYLINE HCL 10 MG PO TABS Oral Take 2 tablets (20 mg total) by mouth at bedtime. 180 tablet 0  . DIPHENOXYLATE-ATROPINE 2.5-0.025 MG PO TABS Oral Take 1 tablet by mouth 4 (four) times daily as needed.      Marland Kitchen ESOMEPRAZOLE MAGNESIUM 20 MG PO CPDR Oral Take 1 capsule (20 mg total) by mouth daily. 90 capsule 0    **APPOINTMENT DUE**  . FUROSEMIDE 40 MG PO TABS Oral Take 1 tablet (40 mg total) by mouth 2 (two) times daily. 200 tablet 3  . HYDROMORPHONE HCL 4 MG PO TABS  Oral Take 1 tablet (4 mg total) by mouth every 6 (six) hours as needed. 60 tablet 0  . LEVOTHYROXINE SODIUM 75 MCG PO TABS Oral Take 1 tablet (75 mcg total) by mouth daily. 90 tablet 0    **Labs DUE**  . METOPROLOL SUCCINATE ER 50 MG PO TB24 Oral Take 1 tablet (50 mg total) by mouth daily. 90 tablet 0  . MOXIFLOXACIN HCL 400 MG PO TABS Oral Take 1 tablet (400 mg total) by mouth daily. X 10 days 10 tablet 0  . POTASSIUM CHLORIDE CRYS ER 20 MEQ PO TBCR Oral Take 1 tablet (20 mEq total) by mouth 2 (two) times daily. 200 tablet 3  . ALIGN 4 MG PO CAPS Oral Take by mouth daily.      Marland Kitchen PROCHLORPERAZINE MALEATE 10 MG PO TABS Oral Take 1 tablet (10 mg total) by mouth every 6 (six) hours as needed. 60 tablet 2  . RANITIDINE HCL 150 MG PO CAPS Oral Take 150 mg by mouth as needed.      Marland Kitchen SIMVASTATIN 20 MG PO  TABS Oral Take 1 tablet (20 mg total) by mouth at bedtime. 90 tablet 0    **LABS OVER-DUE**    BP 136/52  Pulse 88  Temp(Src) 98.8 F (37.1 C) (Oral)  Resp 18  SpO2 100%  Physical Exam  Nursing note and vitals reviewed. Constitutional: She appears well-developed and well-nourished.  HENT:  Head: Atraumatic.  Eyes: Conjunctivae are normal.  Neck: Neck supple.  Cardiovascular: Normal rate and regular rhythm.   Pulmonary/Chest: Effort normal and breath sounds normal. She has no wheezes. She has no rales. She exhibits no tenderness.    Musculoskeletal:       Left shoulder: She exhibits decreased range of motion and tenderness. She exhibits no bony tenderness, no swelling, no crepitus and no deformity.       Left elbow: Normal.       Left wrist: She exhibits decreased range of motion, tenderness, bony tenderness and swelling. She exhibits no effusion, no crepitus and no deformity.  Neurological: She is alert. No cranial nerve deficit or sensory deficit. GCS eye subscore is 4. GCS verbal subscore is 5. GCS motor subscore is 6.  Skin: Skin is warm.    ED Course  Procedures (including critical care time)  Labs Reviewed - No data to display No results found.   No diagnosis found.  Results for orders placed in visit on 01/31/12  CBC WITH DIFFERENTIAL      Component Value Range   WBC 3.5 (*) 3.9 - 10.3 (10e3/uL)   NEUT# 1.9  1.5 - 6.5 (10e3/uL)   HGB 8.3 (*) 11.6 - 15.9 (g/dL)   HCT 45.4 (*) 09.8 - 46.6 (%)   Platelets 59 (*) 145 - 400 (10e3/uL)   MCV 103.2 (*) 79.5 - 101.0 (fL)   MCH 33.3  25.1 - 34.0 (pg)   MCHC 32.3  31.5 - 36.0 (g/dL)   RBC 1.19 (*) 1.47 - 5.45 (10e6/uL)   RDW 19.9 (*) 11.2 - 14.5 (%)   lymph# 1.2  0.9 - 3.3 (10e3/uL)   MONO# 0.4  0.1 - 0.9 (10e3/uL)   Eosinophils Absolute 0.0  0.0 - 0.5 (10e3/uL)   Basophils Absolute 0.0  0.0 - 0.1 (10e3/uL)   NEUT% 53.7  38.4 - 76.8 (%)   LYMPH% 35.0  14.0 - 49.7 (%)   MONO% 10.4  0.0 - 14.0 (%)   EOS% 0.6  0.0 -  7.0 (%)   BASO%  0.3  0.0 - 2.0 (%)   nRBC 1 (*) 0 - 0 (%)  HOLD TUBE, BLOOD BANK      Component Value Range   Hold Tube, Blood Bank Blood Bank Order Cancelled    TECHNOLOGIST REVIEW      Component Value Range   Technologist Review Metas and Myelocytes present, MK Rouleaux     Dg Wrist Complete Left  02/01/2012  *RADIOLOGY REPORT*  Clinical Data: Left wrist pain.  LEFT WRIST - COMPLETE 3+ VIEW  Comparison: None.  Findings: Old ulnar styloid fracture.  Degenerative changes at the base of the first metacarpal articulation between the scaphoid and distal carpal row.  Osteopenia.  No acute fracture and no dislocation.  IMPRESSION: No acute bony pathology.  Degenerative changes.  Original Report Authenticated By: Donavan Burnet, M.D.   Ct Angio Chest W/cm &/or Wo Cm  01/22/2012  *RADIOLOGY REPORT*  Clinical Data: Dyspnea, cough, myeloma, evaluate for pneumonia or pulmonary embolism  CT ANGIOGRAPHY CHEST  Technique:  Multidetector CT imaging of the chest using the standard protocol during bolus administration of intravenous contrast. Multiplanar reconstructed images including MIPs were obtained and reviewed to evaluate the vascular anatomy.  Contrast: 80mL OMNIPAQUE IOHEXOL 300 MG/ML  SOLN  Comparison: Chest radiograph dated 12/19/2011.  Findings: No evidence of pulmonary embolism.  High density material scattered throughout the lungs, for example linear high density within a subsegmental right lower lobe pulmonary artery (series 7/image 57), likely reflecting embolized vertebroplasty cement, unchanged.  Patchy opacity in the lateral right upper lobe, compatible with pneumonia.  Minimal patchy opacity in the lingula, likely atelectasis or scarring.  Small right pleural effusion.  No pneumothorax.  Visualized thyroid is unremarkable.  The heart is normal in size.  No pericardial effusion.  Mild coronary atherosclerosis.  Atherosclerotic calcifications of the aortic arch.  Right chest port.  No suspicious  mediastinal, hilar, or axillary lymphadenopathy.  Visualized upper abdomen is unremarkable.  Prior vertebral augmentation at T7, T11, and T12.   Moderate to severe compression deformity at L1, incompletely visualized.  Left shoulder arthroplasty.  Stable benign-appearing sclerotic lesion in the right humeral head (series 4/image 5).  IMPRESSION: No evidence of pulmonary embolism.  Right upper lobe pneumonia.  Small right pleural effusion.  Additional stable ancillary findings as above.  Original Report Authenticated By: Charline Bills, M.D.   Dg Shoulder Left  02/01/2012  *RADIOLOGY REPORT*  Clinical Data: Left shoulder pain.  History prior surgery.  LEFT SHOULDER - 2+ VIEW  Comparison: None.  Findings: Left shoulder replacement is identified.  The device is located.  No fracture.  The patient is status post resection of the distal clavicle.  Postoperative change of vertebroplasty is noted. Imaged lung parenchyma demonstrates scattered, small foci of increased density which may represent methylmethacrylate in pulmonary veins.  IMPRESSION: Left shoulder placement without evidence of complication.  No acute finding.  Original Report Authenticated By: Bernadene Bell. D'ALESSIO, M.D.      MDM  Hx of IgM MM.  Pain most significant to L wrist.  Radial pulses 2+, brisk cap refill, mild warmth noted to L wrist without overlying skin changes.  L shoulder unremarkable on exam.  No CP or SOB.  Will give pain medication and will xray L wrist and L shoulder.  Discuss with my attending.     2:19 PM X-ray of left shoulder and left wrist were unremarkable for acute fracture or dislocation. She is neurovascularly intact.  Plan to have pt f/u with her oncologist  for further management of her condition.  Pt has pain medication at home ,in which i encourage for her to continues for pain.  Pt voice understanding, family member at bedside.       Fayrene Helper, PA-C 02/01/12 1526

## 2012-02-01 NOTE — ED Notes (Signed)
Pts L arm warmer than R arm from wrist to shoulder, no discernable swelling.

## 2012-02-01 NOTE — ED Notes (Signed)
Left arm pain from shoulders to fingers-"I woke up w/it"   Has "habitual stomach problem" but has been feeling more nauseated this am.  Denies any injury.

## 2012-02-01 NOTE — ED Notes (Signed)
Patient transported to X-ray 

## 2012-02-01 NOTE — Discharge Instructions (Signed)
Your pain can be related to a pinch nerve from sleeping on your arm, or it could be related to your multiple myeloma.  Please continue your pain medication at home and follow up with Dr. Truett Perna for further evaluation and management.  Return if you are having fever, numbness, or rash.   RESOURCE GUIDE  Dental Problems  Patients with Medicaid: [facility]                     [id number] [address]                                           Phone:  [phone]                                                  If unable to pay or uninsured, contact:  [facility] or [facility]. to become qualified for the adult dental clinic.  Chronic Pain Problems Contact [facility]  [phone] Patients need to be referred by their primary care doctor.  Insufficient Money for Medicine Contact [employer]:  call "211" or [facility] [phone].  No Primary Care Doctor Call [facility]  [phone] Other agencies that provide inexpensive medical care    [facility]  [phone]    [facility]  [phone]    [facility]  [phone]    [facility]  [phone]    [facility]  [phone]    [facility]  [phone]  Substance Abuse Resources [facility]  ([phone] [facility] [phone] [facility] [phone] Floydene Flock [phone] Residential & Outpatient Substance Abuse Program  ([phone]  Psychological Services [facility]  [phone] [facility]  [phone] [facility]   [phone] (emergency services ([phone])  Abuse/Neglect [facility] Child Abuse Hotline 236-126-6179 [facility] Child Abuse Hotline (585)460-3750 (After Hours)  Emergency Shelter [facility] [phone]  Maternity Homes [facility] (201)659-8504 [facility] [phone]  MRSA Hotline #:   (450)430-9261    [facility]  [facility]  [employer]                           [facility] Dept. [address] [place]                     [address]         [address]  [place]                                               Cristobal Goldmann Phone:  [phone]  Phone:  [phone]                   Phone:  [phone]  [facility] Phone:  [phone]  [facility] Child Abuse Hotline (920)722-1767 (541)694-7977 (After Hours)

## 2012-02-01 NOTE — Telephone Encounter (Signed)
Call from pt's daughter Pam reporting pt woke up with L arm pain it has worsened and now she is having difficulty moving her arm or fingers. Suggested they take pt to ED for evaluation. Reviewed with Dr. Truett Perna: he agrees pt should go to ED.

## 2012-02-02 ENCOUNTER — Ambulatory Visit (HOSPITAL_BASED_OUTPATIENT_CLINIC_OR_DEPARTMENT_OTHER): Payer: Medicare Other | Admitting: Nurse Practitioner

## 2012-02-02 VITALS — BP 120/58 | HR 98 | Temp 97.3°F | Ht 61.0 in | Wt 161.8 lb

## 2012-02-02 DIAGNOSIS — D649 Anemia, unspecified: Secondary | ICD-10-CM

## 2012-02-02 DIAGNOSIS — C9002 Multiple myeloma in relapse: Secondary | ICD-10-CM

## 2012-02-02 DIAGNOSIS — D709 Neutropenia, unspecified: Secondary | ICD-10-CM

## 2012-02-02 DIAGNOSIS — C9000 Multiple myeloma not having achieved remission: Secondary | ICD-10-CM

## 2012-02-02 DIAGNOSIS — M79609 Pain in unspecified limb: Secondary | ICD-10-CM

## 2012-02-02 NOTE — Progress Notes (Signed)
OFFICE PROGRESS NOTE  Interval history:  Karen Barron returns prior to scheduled followup for evaluation of left arm pain. She woke up yesterday morning with severe pain involving the left arm/wrist. The pain is most pronounced at the wrist. She has noted redness and swelling over the left wrist region. She denies known injury. No fever. She denies neck or shoulder pain. She is taking Dilaudid.  She was evaluated in the emergency department on 02/01/2012. X-ray of the left breast was negative for fracture. Degenerative changes were noted. X-ray of the left shoulder showed a left shoulder replacement without evidence of complication.   Objective: Blood pressure 120/58, pulse 98, temperature 97.3 F (36.3 C), temperature source Oral, height 5\' 1"  (1.549 m), weight 161 lb 12.8 oz (73.392 kg).  She is nontender over the left shoulder and elbow. She is markedly tender over the left wrist. There is erythema at the left wrist extending to the dorsal aspect of the hand. She is able to move all fingers. Radial pulse is  2+. Good capillary refill. Arthritic changes noted at the fingers.  Lab Results: Lab Results  Component Value Date   WBC 3.5* 01/31/2012   HGB 8.3* 01/31/2012   HCT 25.7* 01/31/2012   MCV 103.2* 01/31/2012   PLT 59* 01/31/2012    Chemistry:    Chemistry      Component Value Date/Time   NA 136 01/22/2012 1118   NA 140 12/19/2011 1145   K 3.5 01/22/2012 1118   K 3.6 12/19/2011 1145   CL 98 01/22/2012 1118   CL 104 12/19/2011 1145   CO2 28 01/22/2012 1118   CO2 21 12/19/2011 1145   BUN 15 01/22/2012 1118   BUN 19 12/19/2011 1145   CREATININE 1.1 01/22/2012 1118   CREATININE 1.01 12/19/2011 1145   CREATININE 1.08 10/18/2006 1023      Component Value Date/Time   CALCIUM 8.3 01/22/2012 1118   CALCIUM 9.2 12/19/2011 1145   ALKPHOS 36* 11/15/2011 0915   AST 16 11/15/2011 0915   ALT 11 11/15/2011 0915   BILITOT 0.9 11/15/2011 0915       Studies/Results: Dg Wrist Complete Left  02/01/2012   *RADIOLOGY REPORT*  Clinical Data: Left wrist pain.  LEFT WRIST - COMPLETE 3+ VIEW  Comparison: None.  Findings: Old ulnar styloid fracture.  Degenerative changes at the base of the first metacarpal articulation between the scaphoid and distal carpal row.  Osteopenia.  No acute fracture and no dislocation.  IMPRESSION: No acute bony pathology.  Degenerative changes.  Original Report Authenticated By: Donavan Burnet, M.D.   Ct Angio Chest W/cm &/or Wo Cm  01/22/2012  *RADIOLOGY REPORT*  Clinical Data: Dyspnea, cough, myeloma, evaluate for pneumonia or pulmonary embolism  CT ANGIOGRAPHY CHEST  Technique:  Multidetector CT imaging of the chest using the standard protocol during bolus administration of intravenous contrast. Multiplanar reconstructed images including MIPs were obtained and reviewed to evaluate the vascular anatomy.  Contrast: 80mL OMNIPAQUE IOHEXOL 300 MG/ML  SOLN  Comparison: Chest radiograph dated 12/19/2011.  Findings: No evidence of pulmonary embolism.  High density material scattered throughout the lungs, for example linear high density within a subsegmental right lower lobe pulmonary artery (series 7/image 57), likely reflecting embolized vertebroplasty cement, unchanged.  Patchy opacity in the lateral right upper lobe, compatible with pneumonia.  Minimal patchy opacity in the lingula, likely atelectasis or scarring.  Small right pleural effusion.  No pneumothorax.  Visualized thyroid is unremarkable.  The heart is normal in size.  No pericardial effusion.  Mild coronary atherosclerosis.  Atherosclerotic calcifications of the aortic arch.  Right chest port.  No suspicious mediastinal, hilar, or axillary lymphadenopathy.  Visualized upper abdomen is unremarkable.  Prior vertebral augmentation at T7, T11, and T12.   Moderate to severe compression deformity at L1, incompletely visualized.  Left shoulder arthroplasty.  Stable benign-appearing sclerotic lesion in the right humeral head (series  4/image 5).  IMPRESSION: No evidence of pulmonary embolism.  Right upper lobe pneumonia.  Small right pleural effusion.  Additional stable ancillary findings as above.  Original Report Authenticated By: Charline Bills, M.D.   Dg Shoulder Left  02/01/2012  *RADIOLOGY REPORT*  Clinical Data: Left shoulder pain.  History prior surgery.  LEFT SHOULDER - 2+ VIEW  Comparison: None.  Findings: Left shoulder replacement is identified.  The device is located.  No fracture.  The patient is status post resection of the distal clavicle.  Postoperative change of vertebroplasty is noted. Imaged lung parenchyma demonstrates scattered, small foci of increased density which may represent methylmethacrylate in pulmonary veins.  IMPRESSION: Left shoulder placement without evidence of complication.  No acute finding.  Original Report Authenticated By: Bernadene Bell. Maricela Curet, M.D.    Medications: I have reviewed the patient's current medications.  Assessment/Plan:  1. Multiple myeloma treated with subcutaneous Velcade and Cytoxan/Decadron on a weekly schedule beginning in May 2011. She completed 1 year of treatment on 01/24/2011. The serum M-spike and IgM level were increased beginning in May 2012. She began treatment with carfilzomib on 05/25/2011. She completed 2 cycles. The serum M-spike and IgM level were higher on 07/12/2011. She began cycle 1 melphalan/prednisone 07/25/2011. The IgM level was improved on 08/21/2011. She began cycle 2 melphalan/prednisone on 08/29/2011. The melphalan dose was reduced to 8 mg daily for 4 days beginning with cycle 2. The melphalan was dose reduced to 6 mg daily for 4 days beginning with the third cycle of melphalan/prednisone on 09/27/2011. She completed a fourth cycle of melphalan/prednisone beginning on 10/25/2011. The IgM level was stable on 11/15/2011. She completed cycle 6 melphalan/prednisone beginning 12/29/2011. The IgM level was increased on 01/22/2012. 2. Hospitalization with  dyspnea/hypoxia secondary to volume overload in the setting of diastolic heart failure on 05/26/2011. 3. Hospitalization 06/02/2011 with dyspnea. She was found to have marked anemia with a hemoglobin of 6.7. She received a red cell transfusion 09/27/2011. 4. History of congestive heart failure status post hospital admission 05/26/2011. 5. Thrombocytopenia secondary to multiple myeloma, Velcade/Cytoxan and carfilzomib.  6. T7 compression fracture status post kyphoplasty 11/23/2007 by Dr. Noel Gerold. 7. Peripheral neuropathy status post evaluation by Dr. Anne Hahn. The neuropathy is most likely related to Velcade therapy. 8. Herpes zoster at the left abdomen and chest wall status post Valtrex therapy. 9. Postherpetic neuralgia. 10. History of neutropenia secondary to Velcade and Cytoxan. There is persistent neutropenia, likely related to multiple myeloma and melphalan. 11. Pain and swelling of the proximal interphalangeal joint of the right 3rd finger when she was here on 02/21/2011, improved. She was treated with a Medrol Dosepak. 12. Cough, wheezing, dyspnea, and pleuritic right-sided chest pain and here on 01/22/2012. Chest CT showed right upper lobe pneumonia. She a 10 day course of Avelox on 01/31/2012. 13. Persistent severe anemia. She was transfused on 01/22/2012. 14. Abdominal pain, question epigastric. She has been referred to gastroenterology. 15. Left wrist pain/tenderness, erythema and edema. Question cellulitis, question joint infection, question gout.   Disposition-Ms. Canniff will complete a Medrol Dosepak and 10 day course of Keflex. She will  contact the office if the symptoms do not improve or she develops new symptoms such as fever. She will continue Dilaudid as needed for pain. Her next scheduled followup visit is 02/22/2012.  Patient seen with Dr. Truett Perna.  Lonna Cobb ANP/GNP-BC

## 2012-02-03 NOTE — ED Provider Notes (Signed)
Medical screening examination/treatment/procedure(s) were performed by non-physician practitioner and as supervising physician I was immediately available for consultation/collaboration.  Santiel Topper R. Ankith Edmonston, MD 02/03/12 0737 

## 2012-02-05 ENCOUNTER — Telehealth: Payer: Self-pay

## 2012-02-05 NOTE — Telephone Encounter (Signed)
Received VM from pt's spouse with an update on pt's lt arm pain.    Per Mr Karen Barron - medication seems to be helping.  Pt has more flexibility now and more movement in fingers and wrist.  Reports less soreness.  Mr Karen Barron questions if pt will need physical therapy.    Note to Dr Karen Barron for advice. dph

## 2012-02-06 ENCOUNTER — Telehealth: Payer: Self-pay | Admitting: Internal Medicine

## 2012-02-06 ENCOUNTER — Other Ambulatory Visit: Payer: Self-pay | Admitting: *Deleted

## 2012-02-06 MED ORDER — CEPHALEXIN 500 MG PO CAPS: 500.0000 mg | ORAL_CAPSULE | Freq: Three times a day (TID) | ORAL | Status: AC

## 2012-02-06 MED ORDER — METHYLPREDNISOLONE 4 MG PO KIT: PACK | ORAL | Status: AC

## 2012-02-06 NOTE — Telephone Encounter (Signed)
Dr.Hopper please advise (Note will be made "Appointment Due")

## 2012-02-06 NOTE — Telephone Encounter (Signed)
OK ; label  "appt due"

## 2012-02-06 NOTE — Telephone Encounter (Signed)
Refill: Amitriptyline hcl 10mg  tab.. 90 day supply.

## 2012-02-07 MED ORDER — AMITRIPTYLINE HCL 10 MG PO TABS: 20.0000 mg | ORAL_TABLET | Freq: Every day | ORAL | Status: DC

## 2012-02-13 ENCOUNTER — Telehealth: Payer: Self-pay

## 2012-02-13 NOTE — Telephone Encounter (Signed)
Per Dr Truett Perna - notified daughter to continue to monitor at this time.  Dr Truett Perna does not feel the need for labs or pt to be evaluated at this time.  Per Pam, pt is actually lying down for a nap now and stated she wasn't SOB at this time.  Pam states, "Hopefully that's a good sign."  Aware to contact our office if sx persist or if pt worsens before pt's appt next week.  Pam is comfortable with this plan and verbalizes understanding. dph

## 2012-02-13 NOTE — Telephone Encounter (Signed)
Received VM from pt's spouse reporting pt is having difficulty sleeping d/t "persistent chest pain when she lays down."    Called back and spoke with pt's daughter Derrill Memo reports pt is SOB upon lying down and reports a "tightness" in her chest.  Denies SOB or chest pain at rest or exertion; only when lying flat.  Pt has been sleeping in chair.  They have not contacted pt's cardiologist. Elita Quick reports pt's arm is "much improved."  Pt has finished steroids and is taking last antibiotic today.  Pam also reports pt has a decreased appetite and reports "feeling full." confirms that appetite was "a little better" while on steroid, but has decreased over the past 2 days.    Note to Dr Truett Perna for advice re: SOB and decreased appetite. dph

## 2012-02-14 ENCOUNTER — Encounter: Payer: Self-pay | Admitting: *Deleted

## 2012-02-14 ENCOUNTER — Telehealth: Payer: Self-pay | Admitting: *Deleted

## 2012-02-14 DIAGNOSIS — D649 Anemia, unspecified: Secondary | ICD-10-CM

## 2012-02-14 NOTE — Telephone Encounter (Signed)
Called pt, appt given for 02/15/12 at 0815 for lab and 0845 Dr. Truett Perna. She wrote down appts and read them back.

## 2012-02-14 NOTE — Telephone Encounter (Signed)
Message from pt's daughter Elita Quick reporting chest "heaviness" persists when pt is lying down. Asking if HGB can be checked. Reviewed with Dr. Truett Perna: will be out of office this afternoon. OK to check CBC, have pt wait to talk to RN.  Returned call to pt and husband: instructed them to come in for lab on 5/9 when Dr. Truett Perna is in the office. They both verbalize understanding.

## 2012-02-15 ENCOUNTER — Other Ambulatory Visit: Payer: Self-pay

## 2012-02-15 ENCOUNTER — Other Ambulatory Visit: Payer: Self-pay | Admitting: Nurse Practitioner

## 2012-02-15 ENCOUNTER — Ambulatory Visit (HOSPITAL_BASED_OUTPATIENT_CLINIC_OR_DEPARTMENT_OTHER): Payer: Medicare Other

## 2012-02-15 ENCOUNTER — Encounter (HOSPITAL_COMMUNITY)
Admission: RE | Admit: 2012-02-15 | Discharge: 2012-02-15 | Disposition: A | Discharge status: Home / Self Care | Payer: Medicare Other | Source: Ambulatory Visit | Attending: Oncology | Admitting: Oncology

## 2012-02-15 ENCOUNTER — Other Ambulatory Visit (HOSPITAL_BASED_OUTPATIENT_CLINIC_OR_DEPARTMENT_OTHER): Payer: Medicare Other | Admitting: Lab

## 2012-02-15 ENCOUNTER — Ambulatory Visit (HOSPITAL_BASED_OUTPATIENT_CLINIC_OR_DEPARTMENT_OTHER): Payer: Medicare Other | Admitting: Oncology

## 2012-02-15 VITALS — BP 103/58 | HR 79 | Temp 97.9°F | Resp 16

## 2012-02-15 VITALS — BP 105/53 | HR 88 | Temp 97.0°F | Ht 61.0 in | Wt 161.0 lb

## 2012-02-15 DIAGNOSIS — D649 Anemia, unspecified: Secondary | ICD-10-CM

## 2012-02-15 DIAGNOSIS — C9000 Multiple myeloma not having achieved remission: Secondary | ICD-10-CM

## 2012-02-15 DIAGNOSIS — G609 Hereditary and idiopathic neuropathy, unspecified: Secondary | ICD-10-CM

## 2012-02-15 DIAGNOSIS — D709 Neutropenia, unspecified: Secondary | ICD-10-CM

## 2012-02-15 LAB — CBC WITH DIFFERENTIAL/PLATELET
Basophils Absolute: 0 10*3/uL (ref 0.0–0.1)
EOS%: 0.7 % (ref 0.0–7.0)
Eosinophils Absolute: 0 10*3/uL (ref 0.0–0.5)
HCT: 19.2 % — ABNORMAL LOW (ref 34.8–46.6)
HGB: 6.3 g/dL — CL (ref 11.6–15.9)
MCH: 34.6 pg — ABNORMAL HIGH (ref 25.1–34.0)
MCV: 105.5 fL — ABNORMAL HIGH (ref 79.5–101.0)
MONO%: 13.9 % (ref 0.0–14.0)
NEUT#: 1.3 10*3/uL — ABNORMAL LOW (ref 1.5–6.5)
NEUT%: 46.4 % (ref 38.4–76.8)
lymph#: 1.1 10*3/uL (ref 0.9–3.3)

## 2012-02-15 MED ORDER — SODIUM CHLORIDE 0.9 % IJ SOLN
10.0000 mL | INTRAMUSCULAR | Status: DC | PRN
  Filled 2012-02-15: qty 10

## 2012-02-15 MED ORDER — FUROSEMIDE 10 MG/ML IJ SOLN
20.0000 mg | Freq: Once | INTRAMUSCULAR | Status: AC
  Administered 2012-02-15: 20 mg via INTRAVENOUS

## 2012-02-15 MED ORDER — SODIUM CHLORIDE 0.9 % IV SOLN
250.0000 mL | Freq: Once | INTRAVENOUS | Status: AC
  Administered 2012-02-15: 50 mL via INTRAVENOUS

## 2012-02-15 MED ORDER — HEPARIN SOD (PORK) LOCK FLUSH 100 UNIT/ML IV SOLN
500.0000 [IU] | Freq: Every day | INTRAVENOUS | Status: DC | PRN
  Filled 2012-02-15: qty 5

## 2012-02-15 NOTE — Progress Notes (Signed)
OFFICE PROGRESS NOTE   INTERVAL HISTORY:   She returns prior to a scheduled visit. She complains of increased dyspnea with exertion and when lying down. She also has anterior chest discomfort when lying down. She describes the discomfort as a dull/nagging discomfort. The pain does not radiate. No nausea or diaphoresis.  Her appetite is poor. The left wrist pain is much improved after completing Keflex and a Medrol Dosepak.  Objective:  Vital signs in last 24 hours:  Blood pressure 105/53, pulse 88, temperature 97 F (36.1 C), temperature source Oral, height 5\' 1"  (1.549 m), weight 161 lb (73.029 kg).   Resp: Lungs clear bilaterally, no respiratory distress Cardio: Regular rate and rhythm, no rub, no JVD GI: Nontender, no hepatosplenomegaly Vascular: No leg edema Musculoskeletal: No tenderness over the spine or sternum     Portacath/PICC-without erythema  Lab Results:  Lab Results  Component Value Date   WBC 2.9* 02/15/2012   HGB 6.3* 02/15/2012   HCT 19.2* 02/15/2012   MCV 105.5* 02/15/2012   PLT 44* 02/15/2012   ANC 1.3    Medications: I have reviewed the patient's current medications.  Assessment/Plan: 1. Multiple myeloma treated with subcutaneous Velcade and Cytoxan/Decadron on a weekly schedule beginning in May 2011. She completed 1 year of treatment on 01/24/2011. The serum M-spike and IgM level were increased beginning in May 2012. She began treatment with carfilzomib on 05/25/2011. She completed 2 cycles. The serum M-spike and IgM level were higher on 07/12/2011. She began cycle 1 melphalan/prednisone 07/25/2011. The IgM level was improved on 08/21/2011. She began cycle 2 melphalan/prednisone on 08/29/2011. The melphalan dose was reduced to 8 mg daily for 4 days beginning with cycle 2. The melphalan was dose reduced to 6 mg daily for 4 days beginning with the third cycle of melphalan/prednisone on 09/27/2011. She completed a fourth cycle of melphalan/prednisone beginning on  10/25/2011. The IgM level was stable on 11/15/2011. She completed cycle 6 melphalan/prednisone beginning 12/29/2011. The IgM level was increased on 01/22/2012. 2. Hospitalization with dyspnea/hypoxia secondary to volume overload in the setting of diastolic heart failure on 05/26/2011. 3. Hospitalization 06/02/2011 with dyspnea. She was found to have marked anemia with a hemoglobin of 6.7. She received a red cell transfusion 09/27/2011. 4. History of congestive heart failure status post hospital admission 05/26/2011. 5. Thrombocytopenia secondary to multiple myeloma 6. T7 compression fracture status post kyphoplasty 11/23/2007 by Dr. Noel Gerold. 7. Peripheral neuropathy status post evaluation by Dr. Anne Hahn. The neuropathy is most likely related to Velcade therapy. 8. Herpes zoster at the left abdomen and chest wall status post Valtrex therapy. 9. Postherpetic neuralgia. 10. History of neutropenia secondary to Velcade and Cytoxan. There is persistent neutropenia, likely related to multiple myeloma and melphalan. 11. Pain and swelling of the proximal interphalangeal joint of the right 3rd finger when she was here on 02/21/2011, improved. She was treated with a Medrol Dosepak. 12. Cough, wheezing, dyspnea, and pleuritic right-sided chest pain and here on 01/22/2012. Chest CT showed right upper lobe pneumonia. She a 10 day course of Avelox on 01/31/2012. 13. Persistent severe anemia. She was transfused on 01/22/2012. 14. Left wrist pain/tenderness, erythema and edema-02/02/2012,? Gout versus a joint infection. Improved after Keflex and a Medrol Dosepak 15. Pain at the anterior chest when supine and with exertion-? Reflux,? Angina equivalent,? Pain related to myeloma involving the spine or chest   Disposition:  Mr. Ruppert has severe anemia secondary to progression of the multiple myeloma. She will be transfused with packed red blood  cells today. She will let us know if there is persistent pain following the  red cell transfusion. We will make a cardiology referral if the discomfort persists.  She has advanced multiple myeloma. The myeloma has progressed despite multiple systemic therapies. She would like to continue treatment. The plan is to begin a salvage regimen within the next one to 2 weeks. I will review her treatment history. My initial recommendation is to proceed with pomalidomide   Thornton Papas, MD  02/15/2012  9:15 AM

## 2012-02-16 LAB — TYPE AND SCREEN
ABO/RH(D): A POS
Antibody Screen: NEGATIVE
Unit division: 0
Unit division: 0

## 2012-02-20 ENCOUNTER — Other Ambulatory Visit (INDEPENDENT_AMBULATORY_CARE_PROVIDER_SITE_OTHER): Payer: Medicare Other

## 2012-02-20 ENCOUNTER — Encounter: Payer: Self-pay | Admitting: Gastroenterology

## 2012-02-20 ENCOUNTER — Ambulatory Visit (INDEPENDENT_AMBULATORY_CARE_PROVIDER_SITE_OTHER): Payer: Medicare Other | Admitting: Gastroenterology

## 2012-02-20 DIAGNOSIS — K3189 Other diseases of stomach and duodenum: Secondary | ICD-10-CM

## 2012-02-20 DIAGNOSIS — T50904A Poisoning by unspecified drugs, medicaments and biological substances, undetermined, initial encounter: Secondary | ICD-10-CM

## 2012-02-20 DIAGNOSIS — R109 Unspecified abdominal pain: Secondary | ICD-10-CM

## 2012-02-20 DIAGNOSIS — R1013 Epigastric pain: Secondary | ICD-10-CM

## 2012-02-20 DIAGNOSIS — C9000 Multiple myeloma not having achieved remission: Secondary | ICD-10-CM

## 2012-02-20 DIAGNOSIS — R634 Abnormal weight loss: Secondary | ICD-10-CM

## 2012-02-20 DIAGNOSIS — K5903 Drug induced constipation: Secondary | ICD-10-CM

## 2012-02-20 DIAGNOSIS — K5909 Other constipation: Secondary | ICD-10-CM

## 2012-02-20 LAB — AMYLASE: Amylase: 34 U/L (ref 27–131)

## 2012-02-20 MED ORDER — POLYETHYLENE GLYCOL 3350 17 GM/SCOOP PO POWD: ORAL | Status: DC

## 2012-02-20 MED ORDER — SUCRALFATE 1 G PO TABS: 1.0000 g | ORAL_TABLET | Freq: Three times a day (TID) | ORAL | Status: DC

## 2012-02-20 NOTE — Progress Notes (Signed)
History of Present Illness:  This is a very complicated 78 year old Caucasian female who I follow for over 20 years with chronic abdominal pain of unexplained etiology. She has had endoscopy, several colonoscopies, ERCP, and recurrent abdominal CT scans which have been unremarkable. In the past, she primarily had diarrhea of unexplained etiology with a negative malabsorption workup, and negative colon biopsies for microscopic colitis. She had chronic multiple myeloma with recent aggressive disease with pancytopenia requiring chemotherapy and outpatient transfusions. She is followed closely by Dr. Mancel Bale in oncology. She continues to complain of vague diffuse abdominal pain with primary constipation at this point followed by laxative use and diarrhea,then she  takes Lomotil, then becomes  constipated She also uses daily. Dilaudid 4 mg. which causes further constipation. She has chronic acid reflux, and is on Nexium 20 mg a day. She currently denies dysphagia,reflux symptoms, or any symptoms suggestive of ischemic bowel syndrome. Review of her chart shows a previous negative ERCP in 1992 for similar abdominal pain. Associated with her myeloproliferative disease is recurrent congestive heart failure, shortness of breath, and dyspnea on exertion. She has poor apetite, and apparently is lost 80 pounds in weight over the last  several years. There is no history of melena or hematochezia, or any specific hepatobiliary complaints. She denies abuse of alcohol, cigarettes, or NSAIDs. Previous evaluations have shown no evidence of gallstones.  I have reviewed this patient's present history, medical and surgical past history, allergies and medications.     ROS: The remainder of the 10 point ROS is negative.. positive for chronic degenerative arthritis, chronic low back pain, mild pruritus, lower extremity muscle spasms, peripheral edema, mild urinary incontinency, and shortness of breath and dyspnea on exertion.  She denies cough, sputum production or hemoptysis. Patient also denies any specific food intolerances.     Physical Exam: Chronically ill-appearing patient in no acute distress. Blood pressure today 120/60, pulse 60 and regular, weight 163 pounds, and BMI 30.80. Eyes PERRLA, no icterus, fundoscopic exam per opthamologist Skin no lesions noted Neck supple, no adenopathy, no thyroid enlargement, no tenderness Chest clear to percussion and auscultation Heart no significant murmurs, gallops or rubs noted Abdomen no hepatosplenomegaly masses or tenderness, BS normal. I can palpate some stool filled intestinal loops in the lower abdomen. These are nontender and are easily movable. Bowel sounds are nonobstructive. She does have a hysterectomy and appendectomy scar. Rectal inspection normal no fissures, or fistulae noted.  No masses or tenderness on digital exam. Stool guaiac negative. Stool is very pasty and texture but normal color and guaiac-negative. There is no evidence of a fecal impaction. Extremities no acute joint lesions, she has Ted stockings all without pitting edema, phlebitis, or swollen joints. . Neurologic patient oriented x 3, cranial nerves intact, no focal neurologic deficits noted. Psychological mental status normal and normal affect. Somewhat depressed affect noted.  Assessment and plan: Unfortunately, this patient has advancing myeloma with pancytopenia and severe anemia. This has resulted in symptoms of congestive heart failure improved by recent red cell transfusion.. Her abdominal pain is really unchanged over the last 20 years. Currently she seems to have constipation related to inactivity, poor diet, and narcotic use. She then uses laxatives, and gets resultant diarrhea. I do not think she would benefit from endoscopy or colonoscopy. I have ordered amylase and lipase for review. I have asked continue her daily Nexium and will add Carafate 1 g 3 times a day 30 minutes after meals,  MiraLax 8 ounces at bedtime a  regular basis, and other medications per oncology and Dr. Marga Melnick her primary care physician.  Encounter Diagnosis  Name Primary?  . Abdominal pain Yes

## 2012-02-20 NOTE — Patient Instructions (Signed)
Take Carafate one tablet by mouth 30 min after each meal. Take Miralax one capful in 8 ounces of water and drink at bedtime.  Please go to the basement today for your labs.

## 2012-02-21 ENCOUNTER — Other Ambulatory Visit: Payer: Self-pay | Admitting: *Deleted

## 2012-02-21 DIAGNOSIS — C9001 Multiple myeloma in remission: Secondary | ICD-10-CM

## 2012-02-22 ENCOUNTER — Telehealth: Payer: Self-pay | Admitting: *Deleted

## 2012-02-22 ENCOUNTER — Other Ambulatory Visit: Payer: Self-pay | Admitting: *Deleted

## 2012-02-22 ENCOUNTER — Other Ambulatory Visit (HOSPITAL_BASED_OUTPATIENT_CLINIC_OR_DEPARTMENT_OTHER): Payer: Medicare Other | Admitting: Lab

## 2012-02-22 ENCOUNTER — Ambulatory Visit (HOSPITAL_BASED_OUTPATIENT_CLINIC_OR_DEPARTMENT_OTHER): Payer: Medicare Other

## 2012-02-22 ENCOUNTER — Other Ambulatory Visit: Payer: Self-pay | Admitting: Oncology

## 2012-02-22 ENCOUNTER — Ambulatory Visit (HOSPITAL_BASED_OUTPATIENT_CLINIC_OR_DEPARTMENT_OTHER): Payer: Medicare Other | Admitting: Oncology

## 2012-02-22 VITALS — BP 104/44 | HR 96 | Temp 99.0°F | Resp 20

## 2012-02-22 VITALS — BP 105/54 | HR 99 | Temp 97.1°F | Ht 61.0 in | Wt 161.6 lb

## 2012-02-22 DIAGNOSIS — D649 Anemia, unspecified: Secondary | ICD-10-CM

## 2012-02-22 DIAGNOSIS — C9000 Multiple myeloma not having achieved remission: Secondary | ICD-10-CM

## 2012-02-22 DIAGNOSIS — C9001 Multiple myeloma in remission: Secondary | ICD-10-CM

## 2012-02-22 DIAGNOSIS — D709 Neutropenia, unspecified: Secondary | ICD-10-CM

## 2012-02-22 DIAGNOSIS — D696 Thrombocytopenia, unspecified: Secondary | ICD-10-CM

## 2012-02-22 LAB — CBC WITH DIFFERENTIAL/PLATELET
BASO%: 0.8 % (ref 0.0–2.0)
Eosinophils Absolute: 0 10*3/uL (ref 0.0–0.5)
HCT: 23 % — ABNORMAL LOW (ref 34.8–46.6)
LYMPH%: 51 % — ABNORMAL HIGH (ref 14.0–49.7)
MCHC: 32.2 g/dL (ref 31.5–36.0)
MONO#: 0.4 10*3/uL (ref 0.1–0.9)
NEUT#: 0.8 10*3/uL — ABNORMAL LOW (ref 1.5–6.5)
NEUT%: 32.3 % — ABNORMAL LOW (ref 38.4–76.8)
Platelets: 23 10*3/uL — ABNORMAL LOW (ref 145–400)
WBC: 2.5 10*3/uL — ABNORMAL LOW (ref 3.9–10.3)
lymph#: 1.3 10*3/uL (ref 0.9–3.3)
nRBC: 3 % — ABNORMAL HIGH (ref 0–0)

## 2012-02-22 LAB — TECHNOLOGIST REVIEW

## 2012-02-22 MED ORDER — HEPARIN SOD (PORK) LOCK FLUSH 100 UNIT/ML IV SOLN
500.0000 [IU] | Freq: Every day | INTRAVENOUS | Status: AC | PRN
  Administered 2012-02-22: 500 [IU]
  Filled 2012-02-22: qty 5

## 2012-02-22 MED ORDER — SODIUM CHLORIDE 0.9 % IJ SOLN
10.0000 mL | INTRAMUSCULAR | Status: AC | PRN
  Administered 2012-02-22: 10 mL
  Filled 2012-02-22: qty 10

## 2012-02-22 MED ORDER — POMALIDOMIDE 4 MG PO CAPS: 4.0000 mg | ORAL_CAPSULE | Freq: Every day | ORAL | Status: DC

## 2012-02-22 MED ORDER — SODIUM CHLORIDE 0.9 % IV SOLN
250.0000 mL | Freq: Once | INTRAVENOUS | Status: AC
  Administered 2012-02-22: 250 mL via INTRAVENOUS

## 2012-02-22 MED ORDER — FUROSEMIDE 10 MG/ML IJ SOLN
20.0000 mg | Freq: Once | INTRAMUSCULAR | Status: AC
  Administered 2012-02-22: 20 mg via INTRAVENOUS

## 2012-02-22 MED ORDER — DEXAMETHASONE 4 MG PO TABS: 20.0000 mg | ORAL_TABLET | ORAL | Status: DC

## 2012-02-22 NOTE — Telephone Encounter (Signed)
gave patient appointment for 02-28-2012 lab being draw with the flush nurse per orders 03-06-2012 lab will be drawned from the flush nurse printed out calendar and gave to the patient

## 2012-02-22 NOTE — Progress Notes (Signed)
[facility]    OFFICE PROGRESS NOTE   INTERVAL HISTORY:   She returns as scheduled. She noted some improvement in her energy level after the red cell transfusion on 02/15/2012. Ms. Simmering continues to have malaise. She has noted bruising at the right arm tourniquet site. No other bleeding.  Objective:  Vital signs in last 24 hours:  Blood pressure 105/54, pulse 99, temperature 97.1 F (36.2 C), temperature source Oral, height 5\' 1"  (1.549 m), weight 161 lb 9.6 oz (73.301 kg).    HEENT: No thrush or bleeding Resp: Lungs clear bilaterally Cardio: Regular rate and rhythm GI: No hepatosplenomegaly Vascular: No leg edema  Skin: Few small ecchymoses over the right upper arm. No petechiae.   Portacath/PICC-without erythema  Lab Results:  Lab Results  Component Value Date   WBC 2.5* 02/22/2012   HGB 7.4* 02/22/2012   HCT 23.0* 02/22/2012   MCV 99.1 02/22/2012   PLT 23* 02/22/2012   ANC 0.8    Medications: I have reviewed the patient's current medications.  Assessment/Plan: 1. Multiple myeloma treated with subcutaneous Velcade and Cytoxan/Decadron on a weekly schedule beginning in May 2011. She completed 1 year of treatment on 01/24/2011. The serum M-spike and IgM level were increased beginning in May 2012. She began treatment with carfilzomib on 05/25/2011. She completed 2 cycles. The serum M-spike and IgM level were higher on 07/12/2011. She began cycle 1 melphalan/prednisone 07/25/2011. The IgM level was improved on 08/21/2011. She began cycle 2 melphalan/prednisone on 08/29/2011. The melphalan dose was reduced to 8 mg daily for 4 days beginning with cycle 2. The melphalan was dose reduced to 6 mg daily for 4 days beginning with the third cycle of melphalan/prednisone on 09/27/2011. She completed a fourth cycle of melphalan/prednisone beginning on 10/25/2011. The IgM level was stable on 11/15/2011. She completed cycle 6 melphalan/prednisone beginning 12/29/2011. The  IgM level was increased on 01/22/2012. 2. Hospitalization with dyspnea/hypoxia secondary to volume overload in the setting of diastolic heart failure on 05/26/2011. 3. Hospitalization 06/02/2011 with dyspnea. She was found to have marked anemia with a hemoglobin of 6.7. She received a red cell transfusion 09/27/2011. 4. History of congestive heart failure status post hospital admission 05/26/2011. 5. Thrombocytopenia secondary to multiple myeloma-progressive 6. T7 compression fracture status post kyphoplasty 11/23/2007 by Dr. Noel Gerold. 7. Peripheral neuropathy status post evaluation by Dr. Anne Hahn. The neuropathy is most likely related to Velcade therapy. 8. Herpes zoster at the left abdomen and chest wall status post Valtrex therapy. 9. Postherpetic neuralgia. 10. History of neutropenia secondary to Velcade and Cytoxan. There is persistent neutropenia, likely related to multiple myeloma and melphalan. 11. Pain and swelling of the proximal interphalangeal joint of the right 3rd finger when she was here on 02/21/2011, improved. She was treated with a Medrol Dosepak. 12. Cough, wheezing, dyspnea, and pleuritic right-sided chest pain and here on 01/22/2012. Chest CT showed right upper lobe pneumonia. She a 10 day course of Avelox on 01/31/2012. 13. Persistent severe anemia. She was transfused on 02/15/2012. 14. Left wrist pain/tenderness, erythema and edema-02/02/2012,? Gout versus a joint infection. Improved after Keflex and a Medrol Dosepak 15. Pain at the anterior chest when supine and with exertion-? Reflux,? Angina equivalent,? Pain related to myeloma involving the spine or chest  Disposition:  Karen Barron has persistent malaise secondary to progressive multiple myeloma and anemia. She will be transfused with packed red blood cells today. She now has severe thrombocytopenia. She will contact us for bleeding.  I discussed  treatment options with Ms. Gautreau and her husband again today. We discussed a  supportive care approach versus a trial of salvage systemic therapy. She would like to proceed with salvage therapy. The plan is to begin pomalidomide and Decadron. We reviewed the potential toxicities associated with pomalidomide including the chance for the development of severe hematologic toxicity, especially in the setting of pre-existing pancytopenia.  She will begin weekly Decadron today. I will not prescribe aspirin therapy due to the severe thrombocytopenia. She signed paperwork for the pomalidomide today.  Ms. Demo will return for a lab visit in one week. She will be scheduled for an office visit in 2 weeks.   Thornton Papas, MD  02/22/2012  4:34 PM

## 2012-02-22 NOTE — Progress Notes (Signed)
Reviewed drug information on pomalidomide and REMS program with patient and husband. Provided education in verbal and written form. Provided information on how to do her survey via phone or web. She verbalizes understanding and agrees to treatment plan. Also reviewed direcitons on how to take med and the decadron directions.

## 2012-02-22 NOTE — Patient Instructions (Signed)
Blood Transfusion Information  WHAT IS A BLOOD TRANSFUSION?  A transfusion is the replacement of blood or some of its parts. Blood is made up of multiple cells which provide different functions.   Red blood cells carry oxygen and are used for blood loss replacement.   White blood cells fight against infection.   Platelets control bleeding.   Plasma helps clot blood.   Other blood products are available for specialized needs, such as hemophilia or other clotting disorders.  BEFORE THE TRANSFUSION   Who gives blood for transfusions?    You may be able to donate blood to be used at a later date on yourself (autologous donation).   Relatives can be asked to donate blood. This is generally not any safer than if you have received blood from a stranger. The same precautions are taken to ensure safety when a relative's blood is donated.   Healthy volunteers who are fully evaluated to make sure their blood is safe. This is blood bank blood.  Transfusion therapy is the safest it has ever been in the practice of medicine. Before blood is taken from a donor, a complete history is taken to make sure that person has no history of diseases nor engages in risky social behavior (examples are intravenous drug use or sexual activity with multiple partners). The donor's travel history is screened to minimize risk of transmitting infections, such as malaria. The donated blood is tested for signs of infectious diseases, such as HIV and hepatitis. The blood is then tested to be sure it is compatible with you in order to minimize the chance of a transfusion reaction. If you or a relative donates blood, this is often done in anticipation of surgery and is not appropriate for emergency situations. It takes many days to process the donated blood.  RISKS AND COMPLICATIONS  Although transfusion therapy is very safe and saves many lives, the main dangers of transfusion include:    Getting an infectious disease.   Developing a  transfusion reaction. This is an allergic reaction to something in the blood you were given. Every precaution is taken to prevent this.  The decision to have a blood transfusion has been considered carefully by your caregiver before blood is given. Blood is not given unless the benefits outweigh the risks.  AFTER THE TRANSFUSION   Right after receiving a blood transfusion, you will usually feel much better and more energetic. This is especially true if your red blood cells have gotten low (anemic). The transfusion raises the level of the red blood cells which carry oxygen, and this usually causes an energy increase.   The nurse administering the transfusion will monitor you carefully for complications.  HOME CARE INSTRUCTIONS   No special instructions are needed after a transfusion. You may find your energy is better. Speak with your caregiver about any limitations on activity for underlying diseases you may have.  SEEK MEDICAL CARE IF:    Your condition is not improving after your transfusion.   You develop redness or irritation at the intravenous (IV) site.  SEEK IMMEDIATE MEDICAL CARE IF:   Any of the following symptoms occur over the next 12 hours:   Shaking chills.   You have a temperature by mouth above 102 F (38.9 C), not controlled by medicine.   Chest, back, or muscle pain.   People around you feel you are not acting correctly or are confused.   Shortness of breath or difficulty breathing.   Dizziness and fainting.     You get a rash or develop hives.   You have a decrease in urine output.   Your urine turns a dark color or changes to pink, red, or brown.  Any of the following symptoms occur over the next 10 days:   You have a temperature by mouth above 102 F (38.9 C), not controlled by medicine.   Shortness of breath.   Weakness after normal activity.   The white part of the eye turns yellow (jaundice).   You have a decrease in the amount of urine or are urinating less often.   Your  urine turns a dark color or changes to pink, red, or brown.  Document Released: 09/22/2000 Document Revised: 09/14/2011 Document Reviewed: 05/11/2008  [employer] Patient Information 2012 [employer].

## 2012-02-23 LAB — TYPE AND SCREEN
ABO/RH(D): A POS
Unit division: 0

## 2012-02-23 NOTE — Progress Notes (Signed)
Received fax from [facility] stating they are able to service pt for Pomalyst at $17/month.   Fax - [phone].   dph

## 2012-02-26 ENCOUNTER — Telehealth: Payer: Self-pay | Admitting: *Deleted

## 2012-02-26 ENCOUNTER — Inpatient Hospital Stay (HOSPITAL_COMMUNITY)
Admission: EM | Admit: 2012-02-26 | Discharge: 2012-03-11 | DRG: 292 | Disposition: A | Discharge status: Home Health | Payer: Medicare Other | Source: Ambulatory Visit | Attending: Internal Medicine | Admitting: Internal Medicine

## 2012-02-26 ENCOUNTER — Emergency Department (HOSPITAL_COMMUNITY): Payer: Medicare Other

## 2012-02-26 ENCOUNTER — Encounter (HOSPITAL_COMMUNITY): Payer: Self-pay | Admitting: *Deleted

## 2012-02-26 DIAGNOSIS — J019 Acute sinusitis, unspecified: Secondary | ICD-10-CM

## 2012-02-26 DIAGNOSIS — M4800 Spinal stenosis, site unspecified: Secondary | ICD-10-CM

## 2012-02-26 DIAGNOSIS — R5381 Other malaise: Secondary | ICD-10-CM | POA: Diagnosis present

## 2012-02-26 DIAGNOSIS — E039 Hypothyroidism, unspecified: Secondary | ICD-10-CM

## 2012-02-26 DIAGNOSIS — D630 Anemia in neoplastic disease: Secondary | ICD-10-CM | POA: Diagnosis present

## 2012-02-26 DIAGNOSIS — D649 Anemia, unspecified: Secondary | ICD-10-CM

## 2012-02-26 DIAGNOSIS — E860 Dehydration: Secondary | ICD-10-CM

## 2012-02-26 DIAGNOSIS — C8800 Waldenstrom macroglobulinemia not having achieved remission: Secondary | ICD-10-CM

## 2012-02-26 DIAGNOSIS — R1013 Epigastric pain: Secondary | ICD-10-CM

## 2012-02-26 DIAGNOSIS — M810 Age-related osteoporosis without current pathological fracture: Secondary | ICD-10-CM

## 2012-02-26 DIAGNOSIS — I5033 Acute on chronic diastolic (congestive) heart failure: Principal | ICD-10-CM

## 2012-02-26 DIAGNOSIS — K219 Gastro-esophageal reflux disease without esophagitis: Secondary | ICD-10-CM

## 2012-02-26 DIAGNOSIS — K7689 Other specified diseases of liver: Secondary | ICD-10-CM

## 2012-02-26 DIAGNOSIS — B352 Tinea manuum: Secondary | ICD-10-CM | POA: Diagnosis present

## 2012-02-26 DIAGNOSIS — R079 Chest pain, unspecified: Secondary | ICD-10-CM

## 2012-02-26 DIAGNOSIS — K5730 Diverticulosis of large intestine without perforation or abscess without bleeding: Secondary | ICD-10-CM

## 2012-02-26 DIAGNOSIS — I5032 Chronic diastolic (congestive) heart failure: Secondary | ICD-10-CM

## 2012-02-26 DIAGNOSIS — E871 Hypo-osmolality and hyponatremia: Secondary | ICD-10-CM | POA: Diagnosis present

## 2012-02-26 DIAGNOSIS — G609 Hereditary and idiopathic neuropathy, unspecified: Secondary | ICD-10-CM

## 2012-02-26 DIAGNOSIS — R109 Unspecified abdominal pain: Secondary | ICD-10-CM

## 2012-02-26 DIAGNOSIS — E876 Hypokalemia: Secondary | ICD-10-CM

## 2012-02-26 DIAGNOSIS — N179 Acute kidney failure, unspecified: Secondary | ICD-10-CM

## 2012-02-26 DIAGNOSIS — J40 Bronchitis, not specified as acute or chronic: Secondary | ICD-10-CM

## 2012-02-26 DIAGNOSIS — Z860100 Personal history of colon polyps, unspecified: Secondary | ICD-10-CM

## 2012-02-26 DIAGNOSIS — I509 Heart failure, unspecified: Secondary | ICD-10-CM

## 2012-02-26 DIAGNOSIS — E119 Type 2 diabetes mellitus without complications: Secondary | ICD-10-CM

## 2012-02-26 DIAGNOSIS — R609 Edema, unspecified: Secondary | ICD-10-CM

## 2012-02-26 DIAGNOSIS — C9000 Multiple myeloma not having achieved remission: Secondary | ICD-10-CM

## 2012-02-26 DIAGNOSIS — M1990 Unspecified osteoarthritis, unspecified site: Secondary | ICD-10-CM

## 2012-02-26 DIAGNOSIS — K120 Recurrent oral aphthae: Secondary | ICD-10-CM | POA: Diagnosis present

## 2012-02-26 DIAGNOSIS — D709 Neutropenia, unspecified: Secondary | ICD-10-CM

## 2012-02-26 DIAGNOSIS — C9001 Multiple myeloma in remission: Secondary | ICD-10-CM

## 2012-02-26 DIAGNOSIS — K589 Irritable bowel syndrome without diarrhea: Secondary | ICD-10-CM

## 2012-02-26 DIAGNOSIS — R059 Cough, unspecified: Secondary | ICD-10-CM | POA: Diagnosis present

## 2012-02-26 DIAGNOSIS — E785 Hyperlipidemia, unspecified: Secondary | ICD-10-CM

## 2012-02-26 DIAGNOSIS — I5031 Acute diastolic (congestive) heart failure: Secondary | ICD-10-CM

## 2012-02-26 DIAGNOSIS — J45909 Unspecified asthma, uncomplicated: Secondary | ICD-10-CM

## 2012-02-26 DIAGNOSIS — R05 Cough: Secondary | ICD-10-CM | POA: Diagnosis present

## 2012-02-26 DIAGNOSIS — D61818 Other pancytopenia: Secondary | ICD-10-CM | POA: Diagnosis present

## 2012-02-26 HISTORY — DX: Chronic diastolic (congestive) heart failure: I50.32

## 2012-02-26 LAB — DIFFERENTIAL
Band Neutrophils: 0 % (ref 0–10)
Basophils Absolute: 0 10*3/uL (ref 0.0–0.1)
Basophils Relative: 0 % (ref 0–1)
Blasts: 0 %
Eosinophils Absolute: 0 K/uL (ref 0.0–0.7)
Eosinophils Relative: 0 % (ref 0–5)
Lymphocytes Relative: 37 % (ref 12–46)
Lymphs Abs: 1.5 K/uL (ref 0.7–4.0)
Metamyelocytes Relative: 0 %
Monocytes Absolute: 0.2 K/uL (ref 0.1–1.0)
Monocytes Relative: 6 % (ref 3–12)
Myelocytes: 0 %
Neutro Abs: 2.3 10*3/uL (ref 1.7–7.7)
Neutrophils Relative %: 57 % (ref 43–77)
Promyelocytes Absolute: 0 %
nRBC: 0 /100{WBCs}

## 2012-02-26 LAB — COMPREHENSIVE METABOLIC PANEL WITH GFR
ALT: 19 U/L (ref 0–35)
Alkaline Phosphatase: 30 U/L — ABNORMAL LOW (ref 39–117)
BUN: 41 mg/dL — ABNORMAL HIGH (ref 6–23)
CO2: 22 meq/L (ref 19–32)
Chloride: 96 meq/L (ref 96–112)
GFR calc Af Amer: 29 mL/min — ABNORMAL LOW (ref >90)
GFR calc non Af Amer: 25 mL/min — ABNORMAL LOW (ref >90)
Glucose, Bld: 110 mg/dL — ABNORMAL HIGH (ref 70–99)
Potassium: 4.9 meq/L (ref 3.5–5.1)
Sodium: 133 meq/L — ABNORMAL LOW (ref 135–145)
Total Bilirubin: 2 mg/dL — ABNORMAL HIGH (ref 0.3–1.2)
Total Protein: 10 g/dL — ABNORMAL HIGH (ref 6.0–8.3)

## 2012-02-26 LAB — PROTEIN ELECTROPHORESIS, SERUM
Albumin ELP: 41.2 % — ABNORMAL LOW (ref 55.8–66.1)
Alpha-1-Globulin: 2.6 % — ABNORMAL LOW (ref 2.9–4.9)
Beta 2: 2.8 % — ABNORMAL LOW (ref 3.2–6.5)
Beta Globulin: 3 % — ABNORMAL LOW (ref 4.7–7.2)
Total Protein, Serum Electrophoresis: 9.2 g/dL — ABNORMAL HIGH (ref 6.0–8.3)

## 2012-02-26 LAB — CBC
HCT: 26.5 % — ABNORMAL LOW (ref 36.0–46.0)
Hemoglobin: 8.6 g/dL — ABNORMAL LOW (ref 12.0–15.0)
MCH: 31.6 pg (ref 26.0–34.0)
MCHC: 32.5 g/dL (ref 30.0–36.0)
MCV: 97.4 fL (ref 78.0–100.0)
Platelets: 18 K/uL — CL (ref 150–400)
RBC: 2.72 MIL/uL — ABNORMAL LOW (ref 3.87–5.11)
RDW: 21.6 % — ABNORMAL HIGH (ref 11.5–15.5)
WBC: 4 K/uL (ref 4.0–10.5)

## 2012-02-26 LAB — URINALYSIS, ROUTINE W REFLEX MICROSCOPIC
Bilirubin Urine: NEGATIVE
Glucose, UA: NEGATIVE mg/dL
Hgb urine dipstick: NEGATIVE
Ketones, ur: NEGATIVE mg/dL
Nitrite: NEGATIVE
Protein, ur: NEGATIVE mg/dL
Specific Gravity, Urine: 1.009 (ref 1.005–1.030)
Urobilinogen, UA: 0.2 mg/dL (ref 0.0–1.0)
pH: 5 (ref 5.0–8.0)

## 2012-02-26 LAB — COMPREHENSIVE METABOLIC PANEL
AST: 14 U/L (ref 0–37)
Albumin: 3.2 g/dL — ABNORMAL LOW (ref 3.5–5.2)
Calcium: 10 mg/dL (ref 8.4–10.5)
Creatinine, Ser: 1.77 mg/dL — ABNORMAL HIGH (ref 0.50–1.10)

## 2012-02-26 LAB — LIPASE, BLOOD: Lipase: 47 U/L (ref 11–59)

## 2012-02-26 LAB — URINE MICROSCOPIC-ADD ON

## 2012-02-26 LAB — PRO B NATRIURETIC PEPTIDE: Pro B Natriuretic peptide (BNP): 5099 pg/mL — ABNORMAL HIGH (ref 0–450)

## 2012-02-26 LAB — CARDIAC PANEL(CRET KIN+CKTOT+MB+TROPI)
CK, MB: 2.1 ng/mL (ref 0.3–4.0)
Relative Index: INVALID (ref 0.0–2.5)
Total CK: 30 U/L (ref 7–177)

## 2012-02-26 LAB — TROPONIN I: Troponin I: <0.3 ng/mL (ref <0.30)

## 2012-02-26 MED ORDER — AMITRIPTYLINE HCL 10 MG PO TABS
20.0000 mg | ORAL_TABLET | Freq: Every day | ORAL | Status: DC
  Administered 2012-02-27 – 2012-03-10 (×14): 20 mg via ORAL
  Filled 2012-02-26 (×17): qty 2

## 2012-02-26 MED ORDER — HEPARIN SOD (PORK) LOCK FLUSH 100 UNIT/ML IV SOLN: 500.0000 [IU] | Freq: Every day | INTRAVENOUS | Status: DC | PRN

## 2012-02-26 MED ORDER — POMALIDOMIDE 4 MG PO CAPS: 4.0000 mg | ORAL_CAPSULE | ORAL | Status: DC

## 2012-02-26 MED ORDER — PANTOPRAZOLE SODIUM 40 MG PO TBEC
40.0000 mg | DELAYED_RELEASE_TABLET | Freq: Every day | ORAL | Status: DC
  Administered 2012-02-27 – 2012-03-11 (×14): 40 mg via ORAL
  Filled 2012-02-26 (×14): qty 1

## 2012-02-26 MED ORDER — ACETAMINOPHEN 325 MG PO TABS
650.0000 mg | ORAL_TABLET | Freq: Four times a day (QID) | ORAL | Status: DC | PRN
  Administered 2012-02-27: 650 mg via ORAL
  Filled 2012-02-26: qty 2

## 2012-02-26 MED ORDER — HEPARIN SOD (PORK) LOCK FLUSH 100 UNIT/ML IV SOLN: 250.0000 [IU] | INTRAVENOUS | Status: DC | PRN

## 2012-02-26 MED ORDER — LEVOTHYROXINE SODIUM 75 MCG PO TABS
75.0000 ug | ORAL_TABLET | Freq: Every day | ORAL | Status: DC
  Administered 2012-02-27 – 2012-03-11 (×14): 75 ug via ORAL
  Filled 2012-02-26 (×14): qty 1

## 2012-02-26 MED ORDER — SODIUM CHLORIDE 0.9 % IV SOLN
250.0000 mL | INTRAVENOUS | Status: DC | PRN
  Administered 2012-02-27 – 2012-03-03 (×4): 250 mL via INTRAVENOUS

## 2012-02-26 MED ORDER — SODIUM CHLORIDE 0.9 % IJ SOLN: 10.0000 mL | INTRAMUSCULAR | Status: DC | PRN

## 2012-02-26 MED ORDER — SODIUM CHLORIDE 0.9 % IJ SOLN: 3.0000 mL | INTRAMUSCULAR | Status: DC | PRN

## 2012-02-26 MED ORDER — ACETAMINOPHEN 650 MG RE SUPP: 650.0000 mg | Freq: Four times a day (QID) | RECTAL | Status: DC | PRN

## 2012-02-26 MED ORDER — POTASSIUM CHLORIDE CRYS ER 20 MEQ PO TBCR
20.0000 meq | EXTENDED_RELEASE_TABLET | Freq: Two times a day (BID) | ORAL | Status: DC
  Administered 2012-02-26 – 2012-03-11 (×28): 20 meq via ORAL
  Filled 2012-02-26 (×31): qty 1

## 2012-02-26 MED ORDER — METOPROLOL SUCCINATE ER 50 MG PO TB24
50.0000 mg | ORAL_TABLET | Freq: Every day | ORAL | Status: DC
  Administered 2012-02-27 – 2012-03-11 (×13): 50 mg via ORAL
  Filled 2012-02-26 (×14): qty 1

## 2012-02-26 MED ORDER — SODIUM CHLORIDE 0.9 % IJ SOLN
10.0000 mL | INTRAMUSCULAR | Status: AC | PRN
  Administered 2012-02-27: 10 mL

## 2012-02-26 MED ORDER — SODIUM CHLORIDE 0.9 % IV SOLN
250.0000 mL | Freq: Once | INTRAVENOUS | Status: AC
  Administered 2012-02-26: 250 mL via INTRAVENOUS

## 2012-02-26 MED ORDER — SODIUM CHLORIDE 0.9 % IV SOLN: 250.0000 mL | Freq: Once | INTRAVENOUS | Status: DC

## 2012-02-26 MED ORDER — SODIUM CHLORIDE 0.9 % IV SOLN
Freq: Once | INTRAVENOUS | Status: AC
  Administered 2012-02-26: 13:00:00 via INTRAVENOUS

## 2012-02-26 MED ORDER — ONDANSETRON HCL 4 MG PO TABS
4.0000 mg | ORAL_TABLET | Freq: Four times a day (QID) | ORAL | Status: DC | PRN
  Administered 2012-02-28 – 2012-03-08 (×6): 4 mg via ORAL
  Filled 2012-02-26 (×6): qty 1

## 2012-02-26 MED ORDER — HEPARIN SOD (PORK) LOCK FLUSH 100 UNIT/ML IV SOLN
500.0000 [IU] | Freq: Every day | INTRAVENOUS | Status: DC | PRN
  Filled 2012-02-26: qty 5

## 2012-02-26 MED ORDER — SODIUM CHLORIDE 0.9 % IJ SOLN: 3.0000 mL | Freq: Two times a day (BID) | INTRAMUSCULAR | Status: DC

## 2012-02-26 MED ORDER — FENTANYL CITRATE 0.05 MG/ML IJ SOLN
50.0000 ug | Freq: Once | INTRAMUSCULAR | Status: AC
  Administered 2012-02-26: 50 ug via INTRAVENOUS
  Filled 2012-02-26: qty 2

## 2012-02-26 MED ORDER — SIMVASTATIN 20 MG PO TABS
20.0000 mg | ORAL_TABLET | Freq: Every day | ORAL | Status: DC
  Administered 2012-02-27 – 2012-03-10 (×14): 20 mg via ORAL
  Filled 2012-02-26 (×17): qty 1

## 2012-02-26 MED ORDER — SODIUM CHLORIDE 0.9 % IV SOLN: INTRAVENOUS | Status: AC

## 2012-02-26 MED ORDER — FUROSEMIDE 10 MG/ML IJ SOLN
40.0000 mg | Freq: Two times a day (BID) | INTRAMUSCULAR | Status: AC
  Administered 2012-02-26 – 2012-02-27 (×2): 40 mg via INTRAVENOUS
  Filled 2012-02-26 (×3): qty 4

## 2012-02-26 MED ORDER — SODIUM CHLORIDE 0.9 % IV BOLUS (SEPSIS)
250.0000 mL | Freq: Once | INTRAVENOUS | Status: AC
  Administered 2012-02-26: 250 mL via INTRAVENOUS

## 2012-02-26 MED ORDER — POMALIDOMIDE 4 MG PO CAPS
4.0000 mg | ORAL_CAPSULE | Freq: Every day | ORAL | Status: DC
  Administered 2012-02-26 – 2012-02-28 (×3): 4 mg via ORAL
  Administered 2012-02-29: 15:00:00 via ORAL
  Administered 2012-03-01 – 2012-03-08 (×8): 4 mg via ORAL
  Filled 2012-02-26 (×3): qty 1

## 2012-02-26 MED ORDER — ONDANSETRON HCL 4 MG/2ML IJ SOLN
4.0000 mg | Freq: Four times a day (QID) | INTRAMUSCULAR | Status: DC | PRN
  Administered 2012-02-29 – 2012-03-11 (×7): 4 mg via INTRAVENOUS
  Filled 2012-02-26 (×8): qty 2

## 2012-02-26 MED ORDER — ALBUTEROL SULFATE HFA 108 (90 BASE) MCG/ACT IN AERS
2.0000 | INHALATION_SPRAY | Freq: Four times a day (QID) | RESPIRATORY_TRACT | Status: DC | PRN
  Administered 2012-03-08: 2 via RESPIRATORY_TRACT
  Filled 2012-02-26 (×2): qty 6.7

## 2012-02-26 MED ORDER — HEPARIN SOD (PORK) LOCK FLUSH 100 UNIT/ML IV SOLN
250.0000 [IU] | INTRAVENOUS | Status: DC | PRN
  Filled 2012-02-26: qty 3

## 2012-02-26 MED ORDER — SODIUM CHLORIDE 0.9 % IJ SOLN
3.0000 mL | Freq: Two times a day (BID) | INTRAMUSCULAR | Status: DC
  Administered 2012-02-27 – 2012-03-09 (×11): 3 mL via INTRAVENOUS
  Administered 2012-03-09: 22:00:00 via INTRAVENOUS
  Administered 2012-03-10 – 2012-03-11 (×3): 3 mL via INTRAVENOUS

## 2012-02-26 MED ORDER — PANTOPRAZOLE SODIUM 40 MG IV SOLR
40.0000 mg | Freq: Once | INTRAVENOUS | Status: AC
  Administered 2012-02-26: 40 mg via INTRAVENOUS
  Filled 2012-02-26: qty 40

## 2012-02-26 MED ORDER — ONDANSETRON HCL 4 MG/2ML IJ SOLN: 4.0000 mg | Freq: Three times a day (TID) | INTRAMUSCULAR | Status: AC | PRN

## 2012-02-26 NOTE — H&P (Signed)
PCP:   Marga Melnick, MD, MD   Chief Complaint:  Shortness of breath since Saturday and epigastric discomfort.  HPI: 78 year old lady with h/o MM on chemotherapy, Diastolic Heart Failure, came in complaining of worsening shortness and epigastric discomfort since Saturday. She denies fever or chills. Occasional nausea, no vomiting. Occasional abdominal pain with loose watery bowel movements since 2 days. H/o recent use of antibiotics for pneumonia with avelox and keflex and steroids. She has weekly Blood transfusions for anemia. She denies any sharp chest pains. She has severe orthopnea to the point that she is not able to lie on the bed even with 3 pillow;s. She is currently sitting up in the chair. She reports worsening pedal edema and bilateral leg pain since weeks. On arrival to ED, she was found to be anemia, thrombocytopenic, and in heart failure. Cardiology consult was obtained and she was started on iv lasix and iv protonix for possible heartburn. She is being admitted to hospitalist service for evaluation and management of heart failure. Also reports occasional headache.   Review of Systems:  The patient denies anorexia, fever, weight loss,, vision loss, decreased hearing, hoarseness, chest pain, syncope, balance deficits, hemoptysis,  melena, hematochezia, , hematuria, incontinence, genital sores, muscle weakness, suspicious skin lesions, transient blindness, difficulty walking, depression, unusual weight change, abnormal bleeding, enlarged lymph nodes, angioedema, and breast masses.  Past Medical History: Past Medical History  Diagnosis Date  . Personal history of colonic polyps   . Osteoporosis   . Waldenstrom's macroglobulinemia   . Diverticular disease   . Thyroid disease   . Fatty liver   . Hypertension   . Diabetes in pregnancy   . Hyperlipidemia   . Spinal stenosis   . Asthma   . Anemia   . Multiple myeloma in remission   . DJD (degenerative joint disease)   . Esophageal  reflux   . Hiatal hernia   . Chronic diastolic CHF (congestive heart failure)     Echo on 05/30/11 revealed nl LV systolic function, EF 65-70%, no WMAs, grade 2 diastolic dysfunction, mildly dilated RA/RV, peak PA pressure   Past Surgical History  Procedure Date  . Total abdominal hysterectomy     due to fibroids  . Appendectomy   . Kyphosis surgery   . Cataract extraction   . Colonoscopy w/ polypectomy   . Total knee arthroplasty     bilateral  . Lumbar fusion   . Portacath placement 04/15/2009    Medications: Prior to Admission medications   Medication Sig Start Date End Date Taking? Authorizing Provider  albuterol (PROVENTIL HFA;VENTOLIN HFA) 108 (90 BASE) MCG/ACT inhaler Inhale 2 puffs into the lungs every 6 (six) hours as needed. For shortness of breath   Yes Historical Provider, MD  amitriptyline (ELAVIL) 10 MG tablet Take 20 mg by mouth at bedtime.   Yes Historical Provider, MD  dexamethasone (DECADRON) 4 MG tablet Take 20 mg by mouth every 30 (thirty) days.   Yes Historical Provider, MD  esomeprazole (NEXIUM) 20 MG capsule Take 20 mg by mouth daily before breakfast.   Yes Historical Provider, MD  furosemide (LASIX) 40 MG tablet Take 40 mg by mouth 2 (two) times daily.   Yes Historical Provider, MD  levothyroxine (SYNTHROID, LEVOTHROID) 75 MCG tablet Take 75 mcg by mouth daily.   Yes Historical Provider, MD  metoprolol succinate (TOPROL-XL) 50 MG 24 hr tablet Take 50 mg by mouth daily. Take with or immediately following a meal.   Yes  Historical Provider, MD  Pomalidomide 4 MG CAPS Take 4 mg by mouth See admin instructions. 4mg  daily for 21 days; then 7 days off   (Last course began on 02-24-12)   Yes Historical Provider, MD  potassium chloride SA (K-DUR,KLOR-CON) 20 MEQ tablet Take 20 mEq by mouth 2 (two) times daily.   Yes Historical Provider, MD  prochlorperazine (COMPAZINE) 10 MG tablet Take 10 mg by mouth every 6 (six) hours as needed. For nausea   Yes Historical  Provider, MD  simvastatin (ZOCOR) 20 MG tablet Take 20 mg by mouth at bedtime.   Yes Historical Provider, MD    Allergies:   Allergies  Allergen Reactions  . Cladribine Other (See Comments)  . Codeine Other (See Comments)    don't remember   . Guaifenesin & Derivatives Other (See Comments)    Elevates BP  . Indomethacin Other (See Comments)    Does not remember reaction   . Naproxen Sodium Other (See Comments)    Does not remember reaction   . Risedronate Sodium     Social History:  reports that she has never smoked. She has never used smokeless tobacco. She reports that she does not drink alcohol or use illicit drugs.   Family History: Family History  Problem Relation Age of Onset  . Leukemia Father   . Osteoporosis Mother   . Cancer Sister     CNS  . Melanoma Sister     Physical Exam: Filed Vitals:   02/26/12 1044 02/26/12 1603  BP: 117/40 108/44  Pulse: 106 91  Temp: 98.3 F (36.8 C) 98 F (36.7 C)  TempSrc: Oral Oral  Resp: 17 18  SpO2: 100% 100%   Constitutional: Vital signs reviewed.  Patient is a well-developed and well-nourished  in no acute distress and cooperative with exam. Alert and oriented x3.  Head: Normocephalic and atraumatic Mouth: no erythema or exudates, MMM Eyes: PERRL, EOMI, conjunctivae normal, No scleral icterus.  Neck: Supple, Trachea midline normal ROM, No JVD, mass, thyromegaly, or carotid bruit present.  Cardiovascular: RRR, S1 normal, S2 normal, no MRG, pulses symmetric and intact bilaterally Pulmonary/Chest: CTAB, no wheezes, rales, or rhonchi Abdominal: Soft. Non-tender, non-distended, bowel sounds are normal, no masses, organomegaly, or guarding present. Musculoskeletal: No joint deformities, erythema, or stiffness, ROM full and tender legs. . Bilateral pedal edema Neurological: A&O x3, Strenght is normal and symmetric bilaterally, cranial nerve II-XII are grossly intact, no focal motor deficit, sensory intact to light touch  bilaterally.  Skin: Warm, dry and intact. No rash, cyanosis, or clubbing.  Psychiatric: Normal mood and affect.    Labs on Admission:   [facility] 02/26/12 1235  NA 133*  K 4.9  CL 96  CO2 22  GLUCOSE 110*  BUN 41*  CREATININE 1.77*  CALCIUM 10.0  MG --  PHOS --    Basename 02/26/12 1235  AST 14  ALT 19  ALKPHOS 30*  BILITOT 2.0*  PROT 10.0*  ALBUMIN 3.2*    Basename 02/26/12 1235  LIPASE 47  AMYLASE --    Basename 02/26/12 1235  WBC 4.0  NEUTROABS 2.3  HGB 8.6*  HCT 26.5*  MCV 97.4  PLT 18*    Basename 02/26/12 1235  CKTOTAL --  CKMB --  CKMBINDEX --  TROPONINI <0.30   No results found for this basename: TSH,T4TOTAL,FREET3,T3FREE,THYROIDAB in the last 72 hours No results found for this basename: VITAMINB12:2,FOLATE:2,FERRITIN:2,TIBC:2,IRON:2,RETICCTPCT:2 in the last 72 hours  Radiological Exams on Admission: Dg Wrist Complete Left  02/01/2012  *  RADIOLOGY REPORT*  Clinical Data: Left wrist pain.  LEFT WRIST - COMPLETE 3+ VIEW  Comparison: None.  Findings: Old ulnar styloid fracture.  Degenerative changes at the base of the first metacarpal articulation between the scaphoid and distal carpal row.  Osteopenia.  No acute fracture and no dislocation.  IMPRESSION: No acute bony pathology.  Degenerative changes.  Original Report Authenticated By: Donavan Burnet, M.D.   Dg Chest Portable 1 View  02/26/2012  *RADIOLOGY REPORT*  Clinical Data: Chest pain.  Short of breath.  PORTABLE CHEST - 1 VIEW  Comparison: 12/19/2011  Findings: Right internal jugular vein Port-A-Cath stable.  No pneumothorax.  Normal heart size.  Low lung volumes with mild basilar atelectasis.  No definite interstitial edema.  IMPRESSION: No active cardiopulmonary disease.  Original Report Authenticated By: Donavan Burnet, M.D.   Dg Shoulder Left  02/01/2012  *RADIOLOGY REPORT*  Clinical Data: Left shoulder pain.  History prior surgery.  LEFT SHOULDER - 2+ VIEW  Comparison: None.  Findings: Left  shoulder replacement is identified.  The device is located.  No fracture.  The patient is status post resection of the distal clavicle.  Postoperative change of vertebroplasty is noted. Imaged lung parenchyma demonstrates scattered, small foci of increased density which may represent methylmethacrylate in pulmonary veins.  IMPRESSION: Left shoulder placement without evidence of complication.  No acute finding.  Original Report Authenticated By: Bernadene Bell. Maricela Curet, M.D.    Assessment/Plan Present on Admission:  1. Epigastric discomfort: improved with pain medication and IV protonix. Will order GI cocktail. Continue with BID protonix.  If the pain is not controlled by am, will call GI  To evaluate for possible esophagitis. She does not have oral thrush. Normal absolute neutrophil count.   2. Anemia/ thrombocytopenia: secondary to MM, weekly blood test and blood transfusions as needed. No evidence of acute bleeding.   3. Acute on chronic Diastolic Heart failure: not well compensated. Elevated probnp.  Started the patient on IV lasix BID and will keep an eye on renal function. 2D echocardiogram ordered. Daily weights, I/O's and cardiac diet. Appreciate cardiology input. EKG does not show ischemic changes.   4. Bilateral leg pain: evaluate for DVT with a venous doppler in view of the medications and Multiple myeloma.   5. Hyponatremia: probably secondary to fluid over load, started the patient on lasix, will continue to monitor.   6. Multiple Myeloma; continue with home medications. Dr Myrle Sheng aware of the patient's admission.  7. Acute renal failure; last creatinine in march 2013 is 1.1. Today's creatinine is 1.7. Will get urine electrolyetes, ultrasound of the kidney to rule out hydronephrosis.  8. DVT prophylaxis: no anticoagulation for thrombocytopenia.       Time spent on this patient including examination and decision-making process: 68 minutes.  Micai Apolinar [phone] 02/26/2012, 4:49  PM

## 2012-02-26 NOTE — ED Notes (Signed)
Patient denies pain and is resting comfortably.  

## 2012-02-26 NOTE — Consult Note (Signed)
CARDIOLOGY CONSULT NOTE  Patient ID: Lonia Mad, MRN: [id number], DOB/AGE: 1926-03-28 78 y.o. Admit date: 02/26/2012 Date of Consult: 02/26/2012  Primary Physician: Marga Melnick, MD Primary Cardiologist: Dr. Daleen Squibb  Chief Complaint: chest pain and dyspnea Reason for Consultation: chest pain and dyspnea  HPI: 79 y.o. female w/ PMHx significant for HTN, HLD, Chronic Diastolic CHF, and Multiple Myeloma who presented to [facility] on 02/26/2012 with complaints of chest pain and dyspnea.  Last cardiac cath 2007 revealed minor nonobstructive CAD, nl LV function. She was hospitalized in Aug '12 for acute on chronic diastolic CHF in the setting of anemia 2/2 multiple myeloma requiring transfusions and IV diuresis. Echo on 05/30/11 revealed nl LV systolic function, EF 65-70%, no WMAs, grade 2 diastolic dysfunction, mildly dilated RA/RV, peak PA pressure .  Patient is a poor historian and has difficulty answering questions with full accuracy, information supplemented by husband and daughter who are at the bedside. Diagnosed with multiple myeloma about 75yrs ago with intermittent periods of remission. Most recently restarted on chemo treatments about 2mos ago. Has required multiple blood transfusions for anemia, most recently last Thursday. Reports having progressively worsening sob over the last 2mos, correlating with the initiation of chemo and requirement for transfusions. Started new chemo drug (Pomalyst) this weekend (02/24/12) and started feeling more short of breath, weak and had chest pain shortly after. She describes the chest pain as a pressure in her epigastric region with occasional "twinge" in her left chest. She is mostly sedentary and describes doe, orthopnea, worsening LE edema, decreased appetite, as well as occasional pain/cramp in her left lower leg. She reports dark stools, but not any more dark than normal and denies hematochezia, abd pain, fever, or chills.  In the ED, EKG  reveals Sinus tach 105bpm, no acute ischemic changes. CXR without acute cardiopulmonary findings. Labs significant for BNP 5099 (previously 2471), normal troponin, H&H 8.6/26.5, Platelets 18,000, WBC 4.0, Crt 1.77 (baseline 0.9-1.1)  Past Medical History  Diagnosis Date  . Personal history of colonic polyps   . Osteoporosis   . Waldenstrom's macroglobulinemia   . Diverticular disease   . Thyroid disease   . Fatty liver   . Hypertension   . Diabetes in pregnancy   . Hyperlipidemia   . Spinal stenosis   . Asthma   . Anemia   . Multiple myeloma in remission   . DJD (degenerative joint disease)   . Esophageal reflux   . Hiatal hernia   . Chronic diastolic CHF (congestive heart failure)     Echo on 05/30/11 revealed nl LV systolic function, EF 65-70%, no WMAs, grade 2 diastolic dysfunction, mildly dilated RA/RV, peak PA pressure     05/30/11 - 2D Echocardiogram Study Conclusions: - Left ventricle: The cavity size was normal. Wall thickness was  normal. Systolic function was vigorous. The estimated ejection   Fraction was in the range of 65% to 70%. Wall motion was normal;   there were no regional wall motion abnormalities. Features are   consistent with a pseudonormal left ventricular filling pattern,   with concomitant abnormal relaxation and increased filling   pressure (grade 2 diastolic dysfunction). Doppler parameters are   consistent with elevated mean left atrial filling pressure.  - Mitral valve: Calcified annulus. - Right ventricle: The cavity size was mildly dilated. - Right atrium: The atrium was mildly dilated. - Tricuspid valve: There was malcoaptation of the valve leaflets.  Mild-moderate regurgitation directed centrally. - Pulmonary arteries: Systolic pressure was  moderately increased. PA  peak pressure: 56mm Hg (S). - Inferior vena cava: The vessel was dilated; the respirophasic  diameter changes were blunted (< 50%); findings are consistent with elevated central venous  pressure.  Surgical History:  Past Surgical History  Procedure Date  . Total abdominal hysterectomy     due to fibroids  . Appendectomy   . Kyphosis surgery   . Cataract extraction   . Colonoscopy w/ polypectomy   . Total knee arthroplasty     bilateral  . Lumbar fusion   . Portacath placement 04/15/2009     Home Meds: Medication Sig  albuterol (PROVENTIL HFA;VENTOLIN HFA) 108 (90 BASE) MCG/ACT inhaler Inhale 2 puffs into the lungs every 6 (six) hours as needed. For shortness of breath  amitriptyline (ELAVIL) 10 MG tablet Take 20 mg by mouth at bedtime.  dexamethasone (DECADRON) 4 MG tablet Take 20 mg by mouth every 30 (thirty) days.  esomeprazole (NEXIUM) 20 MG capsule Take 20 mg by mouth daily before breakfast.  furosemide (LASIX) 40 MG tablet Take 40 mg by mouth 2 (two) times daily.  levothyroxine (SYNTHROID, LEVOTHROID) 75 MCG tablet Take 75 mcg by mouth daily.  metoprolol succinate (TOPROL-XL) 50 MG 24 hr tablet Take 50 mg by mouth daily. Take with or immediately following a meal.  Pomalidomide 4 MG CAPS Take 4 mg by mouth See admin instructions. 4mg  daily for 21 days; then 7 days off   (Last course began on 02-24-12)  potassium chloride SA (K-DUR,KLOR-CON) 20 MEQ tablet Take 20 mEq by mouth 2 (two) times daily.  prochlorperazine (COMPAZINE) 10 MG tablet Take 10 mg by mouth every 6 (six) hours as needed. For nausea  simvastatin (ZOCOR) 20 MG tablet Take 20 mg by mouth at bedtime.    Inpatient Medications:   . sodium chloride   Intravenous Once  . fentaNYL  50 mcg Intravenous Once  . sodium chloride  250 mL Intravenous Once    Allergies:  Allergies  Allergen Reactions  . Cladribine Other (See Comments)  . Codeine Other (See Comments)    don't remember   . Guaifenesin & Derivatives Other (See Comments)    Elevates BP  . Indomethacin Other (See Comments)    Does not remember reaction   . Naproxen Sodium Other (See Comments)    Does not remember reaction   .  Risedronate Sodium    Social History  . Marital Status: Married    Number of Children: 2   Occupational History  . Retired     Social History Main Topics  . Smoking status: Never Smoker   . Smokeless tobacco: Never Used  . Alcohol Use: No  . Drug Use: No   Family History  Problem Relation Age of Onset  . Leukemia Father   . Osteoporosis Mother   . Cancer Sister     CNS  . Melanoma Sister      Review of Systems: General: negative for chills, fever, night sweats or weight changes.  Cardiovascular: As per HPI Dermatological: negative for rash Respiratory: negative for cough or wheezing Urologic: negative for hematuria Abdominal: negative for nausea, vomiting, diarrhea, bright red blood per rectum, melena, or hematemesis Neurologic: negative for visual changes, syncope, or dizziness All other systems reviewed and are otherwise negative except as noted above.  Labs:  Basename 02/26/12 1235  CKTOTAL --  CKMB --  TROPONINI <0.30     02/26/2012 12:35  Pro B Natriuretic peptide (BNP) 5099.0 (H)   Component Value Date  WBC 4.0 02/26/2012   HGB 8.6* 02/26/2012   HCT 26.5* 02/26/2012   MCV 97.4 02/26/2012   PLT 18* 02/26/2012    Lab 02/26/12 1235  NA 133*  K 4.9  CL 96  CO2 22  BUN 41*  CREATININE 1.77*  CALCIUM 10.0  PROT 10.0*  BILITOT 2.0*  ALKPHOS 30*  ALT 19  AST 14  GLUCOSE 110*    Radiology/Studies:   02/26/2012 - Chest Portable 1 View Findings: Right internal jugular vein Port-A-Cath stable.  No pneumothorax.  Normal heart size.  Low lung volumes with mild basilar atelectasis.  No definite interstitial edema.  IMPRESSION: No active cardiopulmonary disease   EKG: 02/26/12 @ 1040 - Sinus tachycardia 105bpm, no acute ischemic changes  Physical Exam: Blood pressure 117/40, pulse 106, temperature 98.3 F (36.8 C), temperature source Oral, resp. rate 17, SpO2 100.00%. General: Elderly frail appearing white female, in no acute distress. Head:  Normocephalic, atraumatic, sclera non-icteric, no xanthomas, nares are without discharge.  Neck: Supple. Negative for carotid bruits. JVD elevated. Lungs: Poor respiratory effort, diminished throughout, fine rales to mid lung fields without wheezes or rhonchi. Breathing is unlabored. Heart: RRR with S1 S2. No murmurs, rubs, or gallops appreciated. Abdomen: Soft, non-tender, non-distended with normoactive bowel sounds.  No rebound/guarding. No obvious abdominal masses. Msk:  Strength and tone appear decreased for age. Extremities: Scattered ecchymosis and petechiae. No clubbing or cyanosis. 1-2+ edema to BLE.  Distal pedal pulses are intact and equal bilaterally. Neuro: Alert and oriented X 3. Moves all extremities spontaneously. Psych:  Responds to questions appropriately with a flat affect.   Assessment and Plan:  78 y.o. female w/ PMHx significant for HTN, HLD, Chronic Diastolic CHF, and Multiple Myeloma who presented to [facility] on 02/26/2012 with complaints of chest pain and dyspnea.  1. Dyspnea: Patient presents with ~35mos progressive dyspnea that worsened over the weekend after chemo treatment. She has required multiple blood transfusions for anemia 2/2 multiple myeloma. Most recent transfusion on 02/22/12 for Hgb 7.4. BNP 5099 and has volume overload on exam. Dyspnea likely multifactorial in the setting of anemia and acute on chronic diastolic CHF. Hgb 8.6 with BUN/Crt 41/1.77 and elevated BNP. Her symptoms are not consistent with PE, but she does have risk factors including malignancy and prolonged immobility and complains of left lower leg pain. Would consider DDimer (was normal in 05/2011). Other etiologies include pericardial effusion, will check 2D echo. No signs to suggest tamponade.  2. Chest pain: No h/o CAD by cath 2007. No WMAs by echo 05/2011. EKG without acute ischemic changes and troponin normal. Symptoms atypical for ACS.  3. Acute on Chronic Diastolic CHF: BNP 5099 and  has mild volume overload on exam. CXR without pulm edema. Discuss diuresis with MD. Monitor renal function and electrolytes closely.  Signed, HOPE, JESSICA PA-C 02/26/2012, 2:49 PM  As above, 78 year old female with past medical history of myeloma, diastolic congestive heart failure, hypertension, hyperlipidemia for evaluation of acute on chronic diastolic congestive heart failure. Patient was recently initiated on a new chemotherapeutic agent for progressive multiple myeloma. Note her disease has progressed and options are becoming limited. She has required transfusion recently for recurrent anemia. She has chronic dyspnea on exertion which has worsened in the past one month. Over the last 3 days she has had orthopnea and increased pedal edema. She has had a vague pain in the left chest area that lasts seconds to 1 minute period it is not pleuritic but it can increase  with lying flat. She denies fevers, chills, productive cough or hemoptysis. She presents to the emergency room and cardiology was asked to evaluate. Her chest x-ray shows no edema. BUN and creatinine are elevated at 41 and 1.77 which is new. Her BNP is 5099. Hemoglobin 8.6. Platelet count 18. Electrocardiogram shows sinus rhythm at a rate of 105. Normal axis. Nonspecific ST changes. Presentation based on history most consistent with volume overload/acute on chronic diastolic congestive heart failure. There may be a component of renal insufficiency contributing (question from progressive multiple myeloma). Will give IV Lasix today and reassess tomorrow. Follow renal function closely. Repeat echocardiogram to assess LV function and to exclude pericardial effusion. Case discussed with Dr. Truett Perna. He will also see patient. Note Pomalyst can be associated with DVT. History less consistent with pulmonary embolus. Check d-dimer. If she does not improve with diuresis Will consider VQ scan. No CT given renal insufficiency. Olga Millers

## 2012-02-26 NOTE — ED Notes (Addendum)
Pt c/o mid-sternum chest "heaviness," nausea, and sob x1 day, pt reports increase SOB when lying flat or w/exertion. Pt reports she is unable to lay flat, states "I feel like I can't breath."  Pt reports she started a new chemo treatment on Saturday.

## 2012-02-26 NOTE — Progress Notes (Signed)
Pharmacy Note  I contacted Dr. Truett Perna regarding Mrs. Pointer's pomalidomide regimen. He reviewed the patient's lab work. I received an order to continue this medication in the hopsital.  [facility], [place].D., BCPS Clinical Pharmacist 02/26/2012 4:32 PM

## 2012-02-26 NOTE — Telephone Encounter (Signed)
Spoke with pt husband, the pt started a new chemo med this weekend and today she is extremely SOB and has labored breathing. She is unable to lie flat. Pt was told by dr Truett Perna to call here. Discussed with dr mcalhany(DOD), pt husband instructed to take the pt to [facility] for eval. cardmaster made aware.

## 2012-02-26 NOTE — Telephone Encounter (Signed)
VM from patient and husband that she started her Pomalyst on 5/18 and now is "so weak I can barely walk". Reports dyspnea and pressure in her chest and pain in her chest. Requests to be seen today.

## 2012-02-26 NOTE — ED Notes (Signed)
To ED for eval of CP and SOB that started yesterday. Pt states she started a new chemo tx on Saturday. Unable to lay flat due to sob

## 2012-02-26 NOTE — ED Provider Notes (Signed)
History     CSN: [id number]  Arrival date & time 02/26/12  1038   First MD Initiated Contact with Patient 02/26/12 1104      Chief Complaint  Patient presents with  . Chest Pain    (Consider location/radiation/quality/duration/timing/severity/associated sxs/prior treatment) HPI Comments: Pt with prior h/o CHF, currently receiving oral chemo at home for multiple myeloma for past 2 days, has had worsening epigastric pressure discomfort, worsening weakness, SOB esp with exertion, inability to sleep or lay flat for last several days.  They spoke to oncologist Dr. Alcide Evener who didn't think new chemo drug would cause reaction this quickly.  They see Dr. Daleen Squibb with cardiology, spoke to provider on call who told them to come to the [facility] ED.  Pt's pain has been constant for several days, did not change with drinking ensure this AM.  Mildly nauseated.  No HA.  No fever, chills, coughing.  Pain stays in place, doesn't radiate. She takes nexium daily, di not help pain.  Patient is a 78 y.o. female presenting with chest pain. The history is provided by the patient, a relative and medical records.  Chest Pain Primary symptoms include fatigue, shortness of breath, abdominal pain and nausea. Pertinent negatives for primary symptoms include no fever, no vomiting and no dizziness.     Past Medical History  Diagnosis Date  . Personal history of colonic polyps   . Osteoporosis   . Waldenstrom's macroglobulinemia   . Diverticular disease   . Thyroid disease   . Fatty liver   . Hypertension   . Diabetes in pregnancy   . Dyspnea on exertion   . Hyperlipidemia   . Edema   . Spinal stenosis   . Asthma   . Anemia   . Multiple myeloma in remission   . DJD (degenerative joint disease)   . Esophageal reflux   . Hiatal hernia     Past Surgical History  Procedure Date  . Total abdominal hysterectomy     due to fibroids  . Appendectomy   . Kyphosis surgery   . Cataract extraction   . Colonoscopy w/  polypectomy   . Total knee arthroplasty     bilateral  . Lumbar fusion   . Portacath placement 04/15/2009    Family History  Problem Relation Age of Onset  . Leukemia Father   . Osteoporosis Mother   . Cancer Sister     CNS  . Melanoma Sister     History  Substance Use Topics  . Smoking status: Never Smoker   . Smokeless tobacco: Never Used  . Alcohol Use: No    OB History    Grav Para Term Preterm Abortions TAB SAB Ect Mult Living                  Review of Systems  Constitutional: Positive for fatigue. Negative for fever.  HENT: Negative for congestion and rhinorrhea.   Respiratory: Positive for chest tightness and shortness of breath.   Cardiovascular: Positive for chest pain.  Gastrointestinal: Positive for nausea and abdominal pain. Negative for vomiting, diarrhea and blood in stool.  Musculoskeletal: Negative for back pain.  Neurological: Negative for dizziness, syncope and headaches.  All other systems reviewed and are negative.    Allergies  Cladribine; Codeine; Guaifenesin & derivatives; Indomethacin; Naproxen sodium; and Risedronate sodium  Home Medications   Current Outpatient Rx  Name Route Sig Dispense Refill  . ALBUTEROL SULFATE HFA 108 (90 BASE) MCG/ACT IN AERS Inhalation Inhale 2  puffs into the lungs every 6 (six) hours as needed. For shortness of breath    . AMITRIPTYLINE HCL 10 MG PO TABS Oral Take 20 mg by mouth at bedtime.    Marland Kitchen DEXAMETHASONE 4 MG PO TABS Oral Take 20 mg by mouth every 30 (thirty) days.    Marland Kitchen ESOMEPRAZOLE MAGNESIUM 20 MG PO CPDR Oral Take 20 mg by mouth daily before breakfast.    . FUROSEMIDE 40 MG PO TABS Oral Take 40 mg by mouth 2 (two) times daily.    Marland Kitchen LEVOTHYROXINE SODIUM 75 MCG PO TABS Oral Take 75 mcg by mouth daily.    Marland Kitchen METOPROLOL SUCCINATE ER 50 MG PO TB24 Oral Take 50 mg by mouth daily. Take with or immediately following a meal.    . POMALIDOMIDE 4 MG PO CAPS Oral Take 4 mg by mouth See admin instructions. 4mg  daily  for 21 days; then 7 days off   (Last course began on 02-24-12)    . POTASSIUM CHLORIDE CRYS ER 20 MEQ PO TBCR Oral Take 20 mEq by mouth 2 (two) times daily.    Marland Kitchen PROCHLORPERAZINE MALEATE 10 MG PO TABS Oral Take 10 mg by mouth every 6 (six) hours as needed. For nausea    . SIMVASTATIN 20 MG PO TABS Oral Take 20 mg by mouth at bedtime.      BP 117/40  Pulse 106  Temp(Src) 98.3 F (36.8 C) (Oral)  Resp 17  SpO2 100%  Physical Exam  Nursing note and vitals reviewed. Constitutional: She appears well-developed and well-nourished.  HENT:  Head: Normocephalic and atraumatic.  Eyes: Pupils are equal, round, and reactive to light. No scleral icterus.  Neck: Trachea normal. Neck supple. JVD present. No tracheal tenderness present. Carotid bruit is not present.  Cardiovascular: Regular rhythm and normal heart sounds.   No extrasystoles are present. Tachycardia present.   No murmur heard. Pulmonary/Chest: Accessory muscle usage present. No stridor. Tachypnea noted. She has no decreased breath sounds. She has no wheezes. She has no rhonchi. She has no rales.  Abdominal: Soft. Normal appearance and bowel sounds are normal. There is no tenderness. There is no rebound, no guarding and no CVA tenderness.  Musculoskeletal: She exhibits edema.  Skin: Skin is warm, dry and intact. No rash noted. No pallor.  Psychiatric: She has a normal mood and affect.    ED Course  Procedures (including critical care time)  Labs Reviewed  CBC - Abnormal; Notable for the following:    RBC 2.72 (*)    Hemoglobin 8.6 (*)    HCT 26.5 (*)    RDW 21.6 (*)    Platelets 18 (*)    All other components within normal limits  PRO B NATRIURETIC PEPTIDE - Abnormal; Notable for the following:    Pro B Natriuretic peptide (BNP) 5099.0 (*)    All other components within normal limits  COMPREHENSIVE METABOLIC PANEL - Abnormal; Notable for the following:    Sodium 133 (*)    Glucose, Bld 110 (*)    BUN 41 (*)    Creatinine,  Ser 1.77 (*)    Total Protein 10.0 (*)    Albumin 3.2 (*)    Alkaline Phosphatase 30 (*)    Total Bilirubin 2.0 (*)    GFR calc non Af Amer 25 (*)    GFR calc Af Amer 29 (*)    All other components within normal limits  URINALYSIS, ROUTINE W REFLEX MICROSCOPIC - Abnormal; Notable for the following:  Leukocytes, UA TRACE (*)    All other components within normal limits  URINE MICROSCOPIC-ADD ON - Abnormal; Notable for the following:    Casts HYALINE CASTS (*)    All other components within normal limits  DIFFERENTIAL  TROPONIN I  LIPASE, BLOOD   Dg Chest Portable 1 View  02/26/2012  *RADIOLOGY REPORT*  Clinical Data: Chest pain.  Short of breath.  PORTABLE CHEST - 1 VIEW  Comparison: 12/19/2011  Findings: Right internal jugular vein Port-A-Cath stable.  No pneumothorax.  Normal heart size.  Low lung volumes with mild basilar atelectasis.  No definite interstitial edema.  IMPRESSION: No active cardiopulmonary disease.  Original Report Authenticated By: Donavan Burnet, M.D.     1. CHF (congestive heart failure)   2. Myeloma   3. Epigastric pain   4. Dehydration     RA sat is 100% and normal  ECG at time 10:40 shows sinus tachycardia at rate 105, normal axis, low frontal QRS voltages, non specific ST abn slightly noticeable compared to prior ECG from 06/02/11.     2:18 PM Pt's labs reveal dehydration, CHF as well as anemia and thrombocytopenia likely related to myeloma and current chemo.  No fever, troponin is neg.  [facility] cardiology to see pt and also [facility] to admit.  Pt and family are informed.  Pt does feels somewhat improved with gentle IVF and O2.    MDM  I reviewed CXR myself and reviewed radiologist interpretation.  Exam and symptoms suggest CHF.  Will check BNP.  If not elevated, will get chest CT to r/o PE.  Pt's legs are edematous but per pt, her legs have looked this swollen before and are symmetric, neg Homan's.  Will discuss with [facility] cardiology.  RA sats are 100%  and normal.         Gavin Pound. Romyn Boswell, MD 02/26/12 1418

## 2012-02-26 NOTE — ED Notes (Signed)
Family at bedside. 

## 2012-02-26 NOTE — ED Notes (Signed)
MD at bedside.EDP Ghim 

## 2012-02-26 NOTE — Telephone Encounter (Signed)
0940-Spoke with patient: Started feeling bad 2-3 hours after 1st dose of Pomalyst. Told her the chemo would not cause this type of side effect so quickly. Has had dyspnea, chest pain and weakness since Saturday.  Sounds like cardiac issue/CHF. Asked if the Dilaudid had helped her chest pain and she said "not really". She does not have NTG at home. Also reports her neck is stiff and back hurts. Suggested she get in to see her cardiologist asap or go to emergency room. Dr. Truett Perna agrees.

## 2012-02-27 DIAGNOSIS — I5031 Acute diastolic (congestive) heart failure: Secondary | ICD-10-CM

## 2012-02-27 DIAGNOSIS — N179 Acute kidney failure, unspecified: Secondary | ICD-10-CM

## 2012-02-27 DIAGNOSIS — I059 Rheumatic mitral valve disease, unspecified: Secondary | ICD-10-CM

## 2012-02-27 DIAGNOSIS — D649 Anemia, unspecified: Secondary | ICD-10-CM

## 2012-02-27 DIAGNOSIS — M79609 Pain in unspecified limb: Secondary | ICD-10-CM

## 2012-02-27 DIAGNOSIS — C9000 Multiple myeloma not having achieved remission: Secondary | ICD-10-CM

## 2012-02-27 DIAGNOSIS — R0602 Shortness of breath: Secondary | ICD-10-CM

## 2012-02-27 DIAGNOSIS — R1013 Epigastric pain: Secondary | ICD-10-CM

## 2012-02-27 LAB — COMPREHENSIVE METABOLIC PANEL
Albumin: 3 g/dL — ABNORMAL LOW (ref 3.5–5.2)
Alkaline Phosphatase: 29 U/L — ABNORMAL LOW (ref 39–117)
BUN: 44 mg/dL — ABNORMAL HIGH (ref 6–23)
Creatinine, Ser: 1.84 mg/dL — ABNORMAL HIGH (ref 0.50–1.10)
Potassium: 4.8 mEq/L (ref 3.5–5.1)
Total Protein: 9.5 g/dL — ABNORMAL HIGH (ref 6.0–8.3)

## 2012-02-27 LAB — CARDIAC PANEL(CRET KIN+CKTOT+MB+TROPI)
Relative Index: INVALID (ref 0.0–2.5)
Total CK: 32 U/L (ref 7–177)

## 2012-02-27 LAB — PROTIME-INR: INR: 1.27 (ref 0.00–1.49)

## 2012-02-27 LAB — TSH: TSH: 0.351 u[IU]/mL (ref 0.350–4.500)

## 2012-02-27 LAB — PATHOLOGIST SMEAR REVIEW

## 2012-02-27 MED ORDER — TRAMADOL HCL 50 MG PO TABS
50.0000 mg | ORAL_TABLET | Freq: Two times a day (BID) | ORAL | Status: DC | PRN
  Administered 2012-02-27 – 2012-03-11 (×12): 50 mg via ORAL
  Filled 2012-02-27 (×13): qty 1

## 2012-02-27 MED ORDER — FUROSEMIDE 10 MG/ML IJ SOLN
40.0000 mg | Freq: Two times a day (BID) | INTRAMUSCULAR | Status: DC
  Administered 2012-02-27: 40 mg via INTRAVENOUS
  Filled 2012-02-27 (×2): qty 4

## 2012-02-27 NOTE — Progress Notes (Signed)
PT/OT Cancellation Note  Treatment cancelled today due to medical issues with patient which prohibited therapy. R/O DVT at this time. Will await results and re-attempt.  Karen Barron, OTR/L Pager [phone] 02/27/2012, 10:47 AM

## 2012-02-27 NOTE — Progress Notes (Signed)
*  PRELIMINARY RESULTS* Vascular Ultrasound Lower extremity venous duplex has been completed.  Preliminary findings: Bilaterally no evidence of DVT.  Farrel Demark, RDMS 02/27/2012, 1:20 PM

## 2012-02-27 NOTE — Progress Notes (Signed)
Subjective: Breathing is same when compared to yesterday.  Swelling of her feet is the same. She wants her chemo medication to be given at 3 pm in the afternoon .  Objective: Weight change:   Intake/Output Summary (Last 24 hours) at 02/27/12 2127 Last data filed at 02/27/12 1750  Gross per 24 hour  Intake    842 ml  Output   1029 ml  Net   -187 ml    Filed Vitals:   02/27/12 1400  BP: 111/67  Pulse: 93  Temp: 98.3 F (36.8 C)  Resp: 18   On Exam  She is alert afebrile comfortable sitting in the chair CVS s1s2 normal . RRR Lungs bibasilar rales.  Abdomen: soft non tender non distended BS+ Extremities: 3+ pedal edema with varicosities. Neuro: pt oriented and no new focal deficits.   Lab Results: Results for orders placed during the hospital encounter of 02/26/12 (from the past 24 hour(s))  PROTIME-INR     Status: Abnormal   Collection Time   02/27/12  1:31 AM      Component Value Range   Prothrombin Time 16.2 (*) 11.6 - 15.2 (seconds)   INR 1.27  0.00 - 1.49   TSH     Status: Normal   Collection Time   02/27/12  1:31 AM      Component Value Range   TSH 0.351  0.350 - 4.500 (uIU/mL)  APTT     Status: Normal   Collection Time   02/27/12  1:31 AM      Component Value Range   aPTT 34  24 - 37 (seconds)  COMPREHENSIVE METABOLIC PANEL     Status: Abnormal   Collection Time   02/27/12  1:31 AM      Component Value Range   Sodium 136  135 - 145 (mEq/L)   Potassium 4.8  3.5 - 5.1 (mEq/L)   Chloride 101  96 - 112 (mEq/L)   CO2 21  19 - 32 (mEq/L)   Glucose, Bld 120 (*) 70 - 99 (mg/dL)   BUN 44 (*) 6 - 23 (mg/dL)   Creatinine, Ser 9.60 (*) 0.50 - 1.10 (mg/dL)   Calcium 45.4  8.4 - 10.5 (mg/dL)   Total Protein 9.5 (*) 6.0 - 8.3 (g/dL)   Albumin 3.0 (*) 3.5 - 5.2 (g/dL)   AST 12  0 - 37 (U/L)   ALT 15  0 - 35 (U/L)   Alkaline Phosphatase 29 (*) 39 - 117 (U/L)   Total Bilirubin 1.9 (*) 0.3 - 1.2 (mg/dL)   GFR calc non Af Amer 24 (*) >90 (mL/min)   GFR calc Af Amer 28  (*) >90 (mL/min)  CARDIAC PANEL(CRET KIN+CKTOT+MB+TROPI)     Status: Normal   Collection Time   02/27/12  1:31 AM      Component Value Range   Total CK 32  7 - 177 (U/L)   CK, MB 2.3  0.3 - 4.0 (ng/mL)   Troponin I <0.30  <0.30 (ng/mL)   Relative Index RELATIVE INDEX IS INVALID  0.0 - 2.5   CARDIAC PANEL(CRET KIN+CKTOT+MB+TROPI)     Status: Normal   Collection Time   02/27/12  8:44 AM      Component Value Range   Total CK 39  7 - 177 (U/L)   CK, MB 2.8  0.3 - 4.0 (ng/mL)   Troponin I <0.30  <0.30 (ng/mL)   Relative Index RELATIVE INDEX IS INVALID  0.0 - 2.5   CLOSTRIDIUM DIFFICILE BY  PCR     Status: Normal   Collection Time   02/27/12  5:34 PM      Component Value Range   C difficile by pcr NEGATIVE  NEGATIVE      Micro Results: Recent Results (from the past 240 hour(s))  TECHNOLOGIST REVIEW     Status: Normal   Collection Time   02/22/12 10:37 AM      Component Value Range Status Comment   Technologist Review     Final    Value: Occ Metas and Myelocytes present, few plasma cells, rouleaux  CLOSTRIDIUM DIFFICILE BY PCR     Status: Normal   Collection Time   02/27/12  5:34 PM      Component Value Range Status Comment   C difficile by pcr NEGATIVE  NEGATIVE  Final     Studies/Results: Dg Wrist Complete Left  02/01/2012  *RADIOLOGY REPORT*  Clinical Data: Left wrist pain.  LEFT WRIST - COMPLETE 3+ VIEW  Comparison: None.  Findings: Old ulnar styloid fracture.  Degenerative changes at the base of the first metacarpal articulation between the scaphoid and distal carpal row.  Osteopenia.  No acute fracture and no dislocation.  IMPRESSION: No acute bony pathology.  Degenerative changes.  Original Report Authenticated By: Donavan Burnet, M.D.   Dg Chest Portable 1 View  02/26/2012  *RADIOLOGY REPORT*  Clinical Data: Chest pain.  Short of breath.  PORTABLE CHEST - 1 VIEW  Comparison: 12/19/2011  Findings: Right internal jugular vein Port-A-Cath stable.  No pneumothorax.  Normal heart  size.  Low lung volumes with mild basilar atelectasis.  No definite interstitial edema.  IMPRESSION: No active cardiopulmonary disease.  Original Report Authenticated By: Donavan Burnet, M.D.   Dg Shoulder Left  02/01/2012  *RADIOLOGY REPORT*  Clinical Data: Left shoulder pain.  History prior surgery.  LEFT SHOULDER - 2+ VIEW  Comparison: None.  Findings: Left shoulder replacement is identified.  The device is located.  No fracture.  The patient is status post resection of the distal clavicle.  Postoperative change of vertebroplasty is noted. Imaged lung parenchyma demonstrates scattered, small foci of increased density which may represent methylmethacrylate in pulmonary veins.  IMPRESSION: Left shoulder placement without evidence of complication.  No acute finding.  Original Report Authenticated By: Bernadene Bell. Maricela Curet, M.D.   Medications: Scheduled Meds:   . sodium chloride   Intravenous STAT  . amitriptyline  20 mg Oral QHS  . furosemide  40 mg Intravenous BID  . furosemide  40 mg Intravenous BID  . levothyroxine  75 mcg Oral Daily  . metoprolol succinate  50 mg Oral Daily  . pantoprazole  40 mg Oral Q1200  . Pomalidomide  4 mg Oral Q1500  . potassium chloride SA  20 mEq Oral BID  . simvastatin  20 mg Oral QHS  . sodium chloride  3 mL Intravenous Q12H   Continuous Infusions:  PRN Meds:.sodium chloride, acetaminophen, acetaminophen, albuterol, heparin lock flush, heparin lock flush, ondansetron (ZOFRAN) IV, ondansetron (ZOFRAN) IV, ondansetron, sodium chloride, sodium chloride, traMADol  Assessment/Plan: Patient Active Hospital Problem List:   Acute on chronic diastolic heart failure (02/26/2012)  Diuresis with lasix. Cardiology on board for the management. 2D echo did not show significant from the old echocardiogram.  Epigastric discomfort (02/26/2012)  resolved. Probably GERD.  Loose Bowel movement: C DIFF ruled out.  Imodium as needed.   Bilateral leg Pain: Venous duplex  negative for DVT.  Continue to monitor.   Hyponatremia: probably related to  fluid overload. Which has resolved with lasix.   Renal insufficiency; worsened slightly when compared to yesterday. Probably secondary to iv diuresis.   CAD:  - remains chest pain free. CArdiac enzymes negative. Tele monitor no  Ischemic events  Multiple Myeloma: on chemotherapy. Dr Myrle Sheng to see patient in am.  FULL CODE.    LOS: 1 day   Jaqualin Serpa 02/27/2012, 9:27 PM

## 2012-02-27 NOTE — Progress Notes (Addendum)
78 y.o. female w/ PMHx significant for HTN, HLD, Chronic Diastolic CHF, and Multiple Myeloma who presented to [facility] on 02/26/2012 with complaints of chest pain and dyspnea.   SUBJECTIVE: She had a good response to diuresis yesterday but remains quite dyspneic.  She reports mild epigastric pain.     . sodium chloride   Intravenous STAT  . sodium chloride  250 mL Intravenous Once  . amitriptyline  20 mg Oral QHS  . furosemide  40 mg Intravenous BID  . levothyroxine  75 mcg Oral Daily  . metoprolol succinate  50 mg Oral Daily  . pantoprazole  40 mg Oral Q1200  . Pomalidomide  4 mg Oral Q1500  . potassium chloride SA  20 mEq Oral BID  . simvastatin  20 mg Oral QHS  . sodium chloride  3 mL Intravenous Q12H      OBJECTIVE: Physical Exam: Filed Vitals:   02/27/12 0100 02/27/12 0940 02/27/12 1400 02/27/12 1530  BP: 134/51 118/58 111/67   Pulse: 107 100 93   Temp: 97.9 F (36.6 C)  98.3 F (36.8 C)   TempSrc: Oral  Oral   Resp: 18  18   Height:      Weight:      SpO2: 100%  100% 94%    Intake/Output Summary (Last 24 hours) at 02/27/12 1801 Last data filed at 02/27/12 1439  Gross per 24 hour  Intake    842 ml  Output   1128 ml  Net   -286 ml    Telemetry reveals sinus rhythm  GEN- The patient is ill appearing, alert and oriented x 3 today.   Head- normocephalic, atraumatic Eyes-  Sclera clear, conjunctiva pink Ears- hearing intact Oropharynx- clear Neck- supple, JVP 8cm Lymph- no cervical lymphadenopathy Lungs- Clear to ausculation bilaterally, normal work of breathing Heart- Regular rate and rhythm,   GI- soft, NT, ND, + BS Extremities- no clubbing, cyanosis, trace edema, SCDs in place Skin- no rash or lesion Psych- euthymic mood, full affect Neuro- strength and sensation are intact  LABS: Basic Metabolic Panel:  Basename 02/27/12 0131 02/26/12 1235  NA 136 133*  K 4.8 4.9  CL 101 96  CO2 21 22  GLUCOSE 120* 110*  BUN 44* 41*  CREATININE  1.84* 1.77*  CALCIUM 10.0 10.0  MG -- --  PHOS -- --   Liver Function Tests:  Basename 02/27/12 0131 02/26/12 1235  AST 12 14  ALT 15 19  ALKPHOS 29* 30*  BILITOT 1.9* 2.0*  PROT 9.5* 10.0*  ALBUMIN 3.0* 3.2*    Basename 02/26/12 1235  LIPASE 47  AMYLASE --   CBC:  Basename 02/26/12 1235  WBC 4.0  NEUTROABS 2.3  HGB 8.6*  HCT 26.5*  MCV 97.4  PLT 18*   Cardiac Enzymes:  Basename 02/27/12 0844 02/27/12 0131 02/26/12 1731  CKTOTAL 39 32 30  CKMB 2.8 2.3 2.1  CKMBINDEX -- -- --  TROPONINI <0.30 <0.30 <0.30   BNP: No components found with this basename: POCBNP:3 D-Dimer:  Basename 02/26/12 1645  DDIMER <0.22   Hemoglobin A1C: No results found for this basename: HGBA1C in the last 72 hours Fasting Lipid Panel: No results found for this basename: CHOL,HDL,LDLCALC,TRIG,CHOLHDL,LDLDIRECT in the last 72 hours Thyroid Function Tests:  Basename 02/27/12 0131  TSH 0.351  T4TOTAL --  T3FREE --  THYROIDAB --   Anemia Panel: No results found for this basename: VITAMINB12,FOLATE,FERRITIN,TIBC,IRON,RETICCTPCT in the last 72 hours  RADIOLOGY: Dg Wrist Complete Left  02/01/2012  *RADIOLOGY REPORT*  Clinical Data: Left wrist pain.  LEFT WRIST - COMPLETE 3+ VIEW  Comparison: None.  Findings: Old ulnar styloid fracture.  Degenerative changes at the base of the first metacarpal articulation between the scaphoid and distal carpal row.  Osteopenia.  No acute fracture and no dislocation.  IMPRESSION: No acute bony pathology.  Degenerative changes.  Original Report Authenticated By: Donavan Burnet, M.D.   Dg Chest Portable 1 View  02/26/2012  *RADIOLOGY REPORT*  Clinical Data: Chest pain.  Short of breath.  PORTABLE CHEST - 1 VIEW  Comparison: 12/19/2011  Findings: Right internal jugular vein Port-A-Cath stable.  No pneumothorax.  Normal heart size.  Low lung volumes with mild basilar atelectasis.  No definite interstitial edema.  IMPRESSION: No active cardiopulmonary disease.   Original Report Authenticated By: Donavan Burnet, M.D.   Dg Shoulder Left  02/01/2012  *RADIOLOGY REPORT*  Clinical Data: Left shoulder pain.  History prior surgery.  LEFT SHOULDER - 2+ VIEW  Comparison: None.  Findings: Left shoulder replacement is identified.  The device is located.  No fracture.  The patient is status post resection of the distal clavicle.  Postoperative change of vertebroplasty is noted. Imaged lung parenchyma demonstrates scattered, small foci of increased density which may represent methylmethacrylate in pulmonary veins.  IMPRESSION: Left shoulder placement without evidence of complication.  No acute finding.  Original Report Authenticated By: Bernadene Bell. D'ALESSIO, M.D.    ASSESSMENT AND PLAN:  Active Problems:  Acute on chronic diastolic heart failure  Epigastric discomfort   Assessment and Plan:  78 y.o. female w/ PMHx significant for HTN, HLD, Chronic Diastolic CHF, and Multiple Myeloma who presented to [facility] on 02/26/2012 with complaints of chest pain and dyspnea.   1. Dyspnea: Patient presents with ~2mos progressive dyspnea that worsened over the weekend after chemo treatment.  Her dDimer is negative and her echo is unchanged from 8/12.  Her EF is preserved though she has moderate elevation in PA pressured.  SOB appears to be out of proportion to volume overload on exam or by CXR. I will continue diuresis for now.  IF she does not improve, other causes for SOB will need to be considered.  2. Chest pain: No h/o CAD by cath 2007. No WMAs by echo . EKG without acute ischemic changes and troponin normal. Symptoms atypical for ACS.  Continue current medical therapy.  3. Acute on Chronic Diastolic CHF:  Only mild volume overload on exam. CXR without pulm edema. Will diurese again today.  Monitor renal function and electrolytes closely. Would have low threshold to involve nephrology given her renal failure.  Her prognosis is quite poor.  I discussed code status  briefly with her.  She is not ready to make a decision at this time but will discuss further with her family.   Hillis Range, MD 02/27/2012 6:02 PM

## 2012-02-27 NOTE — Progress Notes (Signed)
*  PRELIMINARY RESULTS* Echocardiogram 2D Echocardiogram has been performed.  Karen Barron [facility] 02/27/2012, 3:02 PM

## 2012-02-27 NOTE — Evaluation (Signed)
Physical Therapy Evaluation Patient Details Name: Karen Barron MRN: [id number] DOB: Sep 11, 1926 Today's Date: 02/27/2012 Time: 9147-8295 PT Time Calculation (min): 34 min  PT Assessment / Plan / Recommendation Clinical Impression  78 year old lady with h/o multiple myeoloma on chemotherapy currently (just started on a new drug last weekend), Diastolic Heart Failure, came in complaining of worsening shortness and epigastric discomfort since Saturday. In the ED was found to be anemic, thrombocytopenic and in heart failure. Also with c/o pain  bilateral LE, dopplers performed and DVT r/o. Presents to PT today with generalized weakness and fatigue that patient reports she has been dealing with since started on chemotherapy years ago. Also reports this burning in her legs has been going on for a few years as well, could this be related to her chemotherapy? She will benefit physical therapy in the acute setting to address the below problem list so as to maximize pt's mobility and activity tolerance for improved independence and quality of life. Rec HHPT with 24 hour assist.     PT Assessment  Patient needs continued PT services    Follow Up Recommendations  Home health PT;Supervision/Assistance - 24 hour    Barriers to Discharge        lEquipment Recommendations  None recommended by PT    Recommendations for Other Services     Frequency Min 3X/week    Precautions / Restrictions Precautions Precautions: Fall         Mobility  Transfers Transfers: Sit to Stand;Stand to Sit Sit to Stand: 4: Min assist;With upper extremity assist;From chair/3-in-1 Stand to Sit: With upper extremity assist;To chair/3-in-1;4: Min guard Details for Transfer Assistance: sit<->stand x2 with min facilitation and sequencing cues to shift trunk anteriorly over BOS and extend for upright standing; min v/c's for stand ->sit for safe technique and hand placement Ambulation/Gait Ambulation/Gait Assistance: 4: Min  assist Ambulation Distance (Feet): 20 Feet (10 ft x2 with 3 minute seated rest break) Assistive device: 1 person hand held assist (environmental support on opposite side of hand held assist) Ambulation/Gait Assistance Details: slow shuffled very weak gait; hand held assist on right with left upper extremity relying on environmental supports for stability Gait Pattern: Trunk flexed;Shuffle;Decreased hip/knee flexion - right;Decreased hip/knee flexion - left;Decreased stride length    Exercises General Exercises - Lower Extremity Heel Raises: AROM;Both;5 reps;Seated   PT Diagnosis: Difficulty walking;Abnormality of gait;Generalized weakness;Acute pain  PT Problem List: Decreased strength;Decreased activity tolerance;Decreased mobility;Pain;Impaired sensation;Cardiopulmonary status limiting activity;Decreased knowledge of use of DME PT Treatment Interventions: DME instruction;Gait training;Functional mobility training;Therapeutic activities;Therapeutic exercise;Balance training;Neuromuscular re-education;Patient/family education   PT Goals Acute Rehab PT Goals PT Goal Formulation: With patient Time For Goal Achievement: 03/12/12 Potential to Achieve Goals: Good Pt will go Supine/Side to Sit: with modified independence PT Goal: Supine/Side to Sit - Progress: Goal set today Pt will Sit at Edge of Bed: Independently PT Goal: Sit at Edge Of Bed - Progress: Goal set today Pt will go Sit to Supine/Side: with modified independence PT Goal: Sit to Supine/Side - Progress: Goal set today Pt will go Sit to Stand: with modified independence PT Goal: Sit to Stand - Progress: Goal set today Pt will go Stand to Sit: with modified independence PT Goal: Stand to Sit - Progress: Goal set today Pt will Transfer Bed to Chair/Chair to Bed: with modified independence PT Transfer Goal: Bed to Chair/Chair to Bed - Progress: Goal set today Pt will Stand: with modified independence;3 - 5 min;with no upper extremity  support  PT Goal: Stand - Progress: Goal set today Pt will Ambulate: 51 - 150 feet;with modified independence;with least restrictive assistive device PT Goal: Ambulate - Progress: Goal set today Pt will Perform Home Exercise Program: with supervision, verbal cues required/provided PT Goal: Perform Home Exercise Program - Progress: Goal set today  Visit Information  Last PT Received On: 02/27/12 Assistance Needed: +1 (+2 for chair follow to ambulate further)    Subjective Data  Subjective: Im just tired all the time.    Prior Functioning  Home Living Lives With: Spouse;Daughter Available Help at Discharge: Family;Available 24 hours/day Type of Home: House Home Access: Level entry Home Layout: One level Bathroom Shower/Tub: Health visitor: Handicapped height Home Adaptive Equipment: Grab bars around toilet;Grab bars in shower;Straight cane;Walker - rolling Additional Comments: Husband reports she doesn't use the walker but that she just furniture walks and has husband next to her Prior Function Level of Independence: Needs assistance;Independent with assistive device(s) Needs Assistance: Light Housekeeping;Meal Prep;Grooming;Bathing;Dressing Bath: Minimal (lower body bathing and dressing) Dressing: Minimal (lower body dressing) Grooming: Minimal Meal Prep: Total Light Housekeeping: Total Comments: ambulates with the assist of her husband but reports she bathes herself, daughter curls her hair for her, needs help with her bra Communication Communication: No difficulties    Cognition  Overall Cognitive Status: Appears within functional limits for tasks assessed/performed Arousal/Alertness: Lethargic (appears very fatigued) Orientation Level: Appears intact for tasks assessed Behavior During Session: [facility] for tasks performed    Extremity/Trunk Assessment Right Upper Extremity Assessment RUE ROM/Strength/Tone: Deficits RUE ROM/Strength/Tone Deficits: grossly 3/5,  grossly 2/5 grip strength; general fatigue RUE Sensation: WFL - Light Touch;WFL - Proprioception RUE Coordination:  (bradykinetic movements; fatigues quickly) Left Upper Extremity Assessment LUE ROM/Strength/Tone: Deficits LUE ROM/Strength/Tone Deficits: grossly 3/5, grip strength grossly 2/5; reports prior shoulder replacement LUE Sensation: WFL - Light Touch;WFL - Proprioception LUE Coordination:  (bradykinetic movement, fatigues quicly) Right Lower Extremity Assessment RLE ROM/Strength/Tone: Deficits RLE ROM/Strength/Tone Deficits: grossly 3/5; generalized weakness and easy fatigue RLE Sensation: Deficits RLE Sensation Deficits: reports burning sensation with deep pressure, says her skin just aches from the knee down RLE Coordination: WFL - gross/fine motor Left Lower Extremity Assessment LLE ROM/Strength/Tone: Deficits LLE ROM/Strength/Tone Deficits: grossly 3/5; generalized weakness and easy fatigue LLE Sensation: Deficits LLE Sensation Deficits: reports burning sensation with deep pressure, says her skin just aches from the knee down LLE Coordination: WFL - gross/fine motor Trunk Assessment Trunk Assessment: Kyphotic   Balance    End of Session PT - End of Session Equipment Utilized During Treatment: Gait belt Activity Tolerance: Patient limited by fatigue Patient left: in chair;with call bell/phone within reach   [facility] HELEN 02/27/2012, 4:10 PM

## 2012-02-27 NOTE — Progress Notes (Signed)
Sadee Osland Helen Whitlow PT, DPT  Pager: [phone] 

## 2012-02-28 ENCOUNTER — Other Ambulatory Visit: Payer: Medicare Other

## 2012-02-28 ENCOUNTER — Inpatient Hospital Stay (HOSPITAL_COMMUNITY): Payer: Medicare Other

## 2012-02-28 ENCOUNTER — Other Ambulatory Visit: Payer: Medicare Other | Admitting: Lab

## 2012-02-28 DIAGNOSIS — N179 Acute kidney failure, unspecified: Secondary | ICD-10-CM

## 2012-02-28 DIAGNOSIS — D61818 Other pancytopenia: Secondary | ICD-10-CM

## 2012-02-28 DIAGNOSIS — M549 Dorsalgia, unspecified: Secondary | ICD-10-CM

## 2012-02-28 DIAGNOSIS — D696 Thrombocytopenia, unspecified: Secondary | ICD-10-CM

## 2012-02-28 DIAGNOSIS — R1013 Epigastric pain: Secondary | ICD-10-CM

## 2012-02-28 DIAGNOSIS — I5031 Acute diastolic (congestive) heart failure: Secondary | ICD-10-CM

## 2012-02-28 LAB — PRO B NATRIURETIC PEPTIDE: Pro B Natriuretic peptide (BNP): 6180 pg/mL — ABNORMAL HIGH (ref 0–450)

## 2012-02-28 LAB — CBC
HCT: 20.9 % — ABNORMAL LOW (ref 36.0–46.0)
Hemoglobin: 6.7 g/dL — CL (ref 12.0–15.0)
RBC: 2.15 MIL/uL — ABNORMAL LOW (ref 3.87–5.11)
WBC: 2.9 10*3/uL — ABNORMAL LOW (ref 4.0–10.5)

## 2012-02-28 LAB — BASIC METABOLIC PANEL
Chloride: 98 mEq/L (ref 96–112)
GFR calc Af Amer: 29 mL/min — ABNORMAL LOW (ref >90)
GFR calc non Af Amer: 25 mL/min — ABNORMAL LOW (ref >90)
Potassium: 4.1 mEq/L (ref 3.5–5.1)
Sodium: 135 mEq/L (ref 135–145)

## 2012-02-28 MED ORDER — FUROSEMIDE 40 MG PO TABS
40.0000 mg | ORAL_TABLET | Freq: Two times a day (BID) | ORAL | Status: DC
  Administered 2012-02-28 – 2012-03-01 (×5): 40 mg via ORAL
  Filled 2012-02-28 (×7): qty 1

## 2012-02-28 MED ORDER — FUROSEMIDE 10 MG/ML IJ SOLN
20.0000 mg | Freq: Once | INTRAMUSCULAR | Status: AC
  Administered 2012-02-28: 20 mg via INTRAVENOUS
  Filled 2012-02-28: qty 2

## 2012-02-28 MED ORDER — HYDROMORPHONE HCL 4 MG PO TABS
4.0000 mg | ORAL_TABLET | ORAL | Status: DC | PRN
  Administered 2012-02-29 – 2012-03-11 (×14): 4 mg via ORAL
  Filled 2012-02-28 (×2): qty 1
  Filled 2012-02-28: qty 2
  Filled 2012-02-28 (×2): qty 1
  Filled 2012-02-28: qty 2
  Filled 2012-02-28: qty 1
  Filled 2012-02-28 (×3): qty 2
  Filled 2012-02-28: qty 1
  Filled 2012-02-28 (×2): qty 2
  Filled 2012-02-28: qty 1

## 2012-02-28 NOTE — Progress Notes (Addendum)
Physical Therapy Treatment Patient Details Name: Karen Barron MRN: [id number] DOB: [birth year] Today's Date: 02/28/2012 Time: 2956-2130 PT Time Calculation (min): 28 min  PT Assessment / Plan / Recommendation Comments on Treatment Session  Pt activity tolerance improved.  Pt continues to be limited by bilateral foot /Leg pain and fatigue.  O2 sats >95 on 4L O2 via [place].      Follow Up Recommendations  Home health PT;Supervision/Assistance - 24 hour    Barriers to Discharge        Equipment Recommendations  None recommended by PT    Recommendations for Other Services    Frequency Min 3X/week   Plan Discharge plan remains appropriate    Precautions / Restrictions Precautions Precautions: Fall Restrictions Weight Bearing Restrictions: No   Pertinent Vitals/Pain Pt c/o left flank pain 5/10 and bilateral LE (legs and feet) pain 4-5/10.  Pain in LEs with palpation and pressure on feet.  Pt instructed to ambulate with shoes from now on to decrease pressure on feet.      Mobility  Bed Mobility Bed Mobility: Not assessed Transfers Transfers: Sit to Stand;Stand to Sit Sit to Stand: 4: Min assist;With upper extremity assist;From chair/3-in-1 Stand to Sit: 4: Min guard;To chair/3-in-1;With armrests;With upper extremity assist Details for Transfer Assistance: sit<->stand x2 with min facilitation and sequencing cues to shift trunk anteriorly over BOS and extend for upright standing; min v/c's for stand ->sit for safe technique and hand placement Ambulation/Gait Ambulation/Gait Assistance: 4: Min guard Ambulation Distance (Feet): 30 Feet (10 feet trial one, 30 feet trial two. seated rest x 4 minute) Assistive device: Rolling walker Ambulation/Gait Assistance Details: Cues for upright posture.  Cues to increase bilateral step length.   Gait Pattern: Trunk flexed;Shuffle;Decreased hip/knee flexion - right;Decreased hip/knee flexion - left;Decreased stride length General Gait Details: Slow  shuffle gait,  Stairs: No Wheelchair Mobility Wheelchair Mobility: No    Exercises Total Joint Exercises Ankle Circles/Pumps: Both;5 reps;AROM;AAROM;Seated   PT Diagnosis:    PT Problem List:   PT Treatment Interventions:     PT Goals Acute Rehab PT Goals PT Goal Formulation: With patient Time For Goal Achievement: 03/12/12 Potential to Achieve Goals: Good Pt will go Supine/Side to Sit: with modified independence PT Goal: Sit at Edge Of Bed - Progress: Progressing toward goal Pt will go Sit to Supine/Side: with modified independence PT Goal: Sit to Supine/Side - Progress: Progressing toward goal Pt will go Sit to Stand: with modified independence PT Goal: Sit to Stand - Progress: Met PT Goal: Stand to Sit - Progress: Progressing toward goal Pt will Transfer Bed to Chair/Chair to Bed: with modified independence PT Transfer Goal: Bed to Chair/Chair to Bed - Progress: Progressing toward goal Pt will Stand: with modified independence;3 - 5 min;with no upper extremity support PT Goal: Stand - Progress: Progressing toward goal Pt will Ambulate: 51 - 150 feet;with modified independence;with least restrictive assistive device PT Goal: Ambulate - Progress: Progressing toward goal Pt will Perform Home Exercise Program: with supervision, verbal cues required/provided PT Goal: Perform Home Exercise Program - Progress: Progressing toward goal  Visit Information  Last PT Received On: 02/28/12 Assistance Needed: +1    Subjective Data  Subjective: I feel better today.     Cognition  Overall Cognitive Status: Appears within functional limits for tasks assessed/performed Arousal/Alertness: Awake/alert Orientation Level: Appears intact for tasks assessed Behavior During Session: [facility] for tasks performed    Balance  Balance Balance Assessed: No  End of Session PT - End of  Session Equipment Utilized During Treatment: Gait belt Activity Tolerance: Patient limited by fatigue;Patient  limited by pain (pain in bilateral LE and feet from neuropathy.  ) Patient left: in chair;with call bell/phone within reach Nurse Communication: Mobility status    Karen Barron 02/28/2012, 11:13 AM Karen Barron DPT [phone]

## 2012-02-28 NOTE — Progress Notes (Signed)
CRITICAL VALUE ALERT  Critical value received: HGB= 6.4  Date of notification:  02/28/12  Time of notification:  0645  Critical value read back:yes  Nurse who received alert:  R.Vilas Edgerly RN  MD notified (1st page):  Maren Reamer NP  Time of first page:  [phone]  MD notified (2nd page):  Time of second page:  Responding MD:  n/a  Time MD responded:  [phone]

## 2012-02-28 NOTE — Progress Notes (Deleted)
Physical Therapy Treatment Patient Details Name: Karen Barron MRN: [id number] DOB: 1926-08-29 Today's Date: 02/28/2012 Time:  -0943    PT Assessment / Plan / Recommendation Comments on Treatment Session  Pt activity tolerance improved.  Pt continues to be limited by bilateral foot /L    Follow Up Recommendations  Home health PT;Supervision/Assistance - 24 hour    Barriers to Discharge        Equipment Recommendations  None recommended by PT    Recommendations for Other Services    Frequency Min 3X/week   Plan Discharge plan remains appropriate    Precautions / Restrictions Precautions Precautions: Fall Restrictions Weight Bearing Restrictions: No   Pertinent Vitals/Pain Pt reports chronic pain in bilateral LEs 5/10 RN made aware.      Mobility  Bed Mobility Bed Mobility: Not assessed Transfers Transfers: Sit to Stand;Stand to Sit Sit to Stand: 4: Min assist;With upper extremity assist;From chair/3-in-1 Stand to Sit: 4: Min guard;To chair/3-in-1;With armrests;With upper extremity assist Details for Transfer Assistance: sit<->stand x2 with min facilitation and sequencing cues to shift trunk anteriorly over BOS and extend for upright standing; min v/c's for stand ->sit for safe technique and hand placement Ambulation/Gait Ambulation/Gait Assistance: 4: Min guard Ambulation Distance (Feet): 30 Feet (10 feet trial one, 30 feet trial two. seated rest x 4 minute) Assistive device: Rolling walker Ambulation/Gait Assistance Details: Cues for upright posture.  Cues to increase bilateral step length.   Gait Pattern: Trunk flexed;Shuffle;Decreased hip/knee flexion - right;Decreased hip/knee flexion - left;Decreased stride length General Gait Details: Slow shuffle gait,  Stairs: No Wheelchair Mobility Wheelchair Mobility: No    Exercises Total Joint Exercises Ankle Circles/Pumps: Both;5 reps;AROM;AAROM;Seated   PT Diagnosis:    PT Problem List:   PT Treatment Interventions:      PT Goals Acute Rehab PT Goals PT Goal Formulation: With patient Time For Goal Achievement: 03/12/12 Potential to Achieve Goals: Good Pt will go Supine/Side to Sit: with modified independence PT Goal: Sit at Edge Of Bed - Progress: Progressing toward goal Pt will go Sit to Supine/Side: with modified independence PT Goal: Sit to Supine/Side - Progress: Progressing toward goal Pt will go Sit to Stand: with modified independence PT Goal: Sit to Stand - Progress: Met PT Goal: Stand to Sit - Progress: Progressing toward goal Pt will Transfer Bed to Chair/Chair to Bed: with modified independence PT Transfer Goal: Bed to Chair/Chair to Bed - Progress: Progressing toward goal Pt will Stand: with modified independence;3 - 5 min;with no upper extremity support PT Goal: Stand - Progress: Progressing toward goal Pt will Ambulate: 51 - 150 feet;with modified independence;with least restrictive assistive device PT Goal: Ambulate - Progress: Progressing toward goal Pt will Perform Home Exercise Program: with supervision, verbal cues required/provided PT Goal: Perform Home Exercise Program - Progress: Progressing toward goal  Visit Information  Last PT Received On: 02/28/12    Subjective Data  Subjective: I feel better today.     Cognition  Overall Cognitive Status: Appears within functional limits for tasks assessed/performed Arousal/Alertness: Awake/alert Orientation Level: Appears intact for tasks assessed Behavior During Session: [facility] for tasks performed    Balance  Balance Balance Assessed: No  End of Session PT - End of Session Equipment Utilized During Treatment: Gait belt Activity Tolerance: Patient limited by fatigue;Patient limited by pain (pain in bilateral LE and feet from neuropathy.  ) Patient left: in chair;with call bell/phone within reach Nurse Communication: Mobility status    Saje Gallop 02/28/2012, 11:06 AM Theron Arista L. Chilton Si  DPT [phone]

## 2012-02-28 NOTE — Progress Notes (Signed)
Subjective: No new issues, no chest pain.  Objective: Vital signs in last 24 hours: Temp:  [98.3 F (36.8 C)-98.9 F (37.2 C)] 98.8 F (37.1 C) (05/22 0509) Pulse Rate:  [93-113] 113  (05/22 0509) Resp:  [18-20] 18  (05/22 0509) BP: (111-130)/(49-67) 126/51 mmHg (05/22 0509) SpO2:  [94 %-100 %] 100 % (05/22 0509) Weight:  [73.256 kg (161 lb 8 oz)] 73.256 kg (161 lb 8 oz) (05/22 0509) Weight change: -1.244 kg (-2 lb 11.9 oz) Last BM Date: 02/25/12  Intake/Output from previous day: 05/21 0701 - 05/22 0700 In: 1122.2 [P.O.:1002; I.V.:120.2] Out: 1053 [Urine:1051; Stool:2] Total I/O In: 120 [P.O.:120] Out: -    Physical Exam: General: Comfortable, in chair. Alert, communicative, fully oriented, not short of breath at rest.  HEENT:  Moderate clinical pallor, no jaundice, no conjunctival injection or discharge.  NECK:  Supple, JVP not seen, no carotid bruits, no palpable lymphadenopathy, no palpable goiter. CHEST:  Clinically clear to auscultation, no wheezes, no crackles. HEART:  Sounds 1 and 2 heard, normal, regular, mildly tachycardic, no murmurs. ABDOMEN:  Full, soft, non-tender, no palpable organomegaly, no palpable masses, normal bowel sounds. GENITALIA:  Not examined. LOWER EXTREMITIES:  Mild pitting edema, palpable peripheral pulses. Extensive varicosities. Some petechiae between toes. MUSCULOSKELETAL SYSTEM:  Generalized osteoarthritic changes, otherwise, normal. CENTRAL NERVOUS SYSTEM:  No focal neurologic deficit on gross examination.  Lab Results:  Basename 02/28/12 0550 02/26/12 1235  WBC 2.9* 4.0  HGB 6.7* 8.6*  HCT 20.9* 26.5*  PLT 14* 18*    Basename 02/28/12 0550 02/27/12 0131  NA 135 136  K 4.1 4.8  CL 98 101  CO2 23 21  GLUCOSE 129* 120*  BUN 46* 44*  CREATININE 1.81* 1.84*  CALCIUM 9.7 10.0   Recent Results (from the past 240 hour(s))  TECHNOLOGIST REVIEW     Status: Normal   Collection Time   02/22/12 10:37 AM      Component Value Range  Status Comment   Technologist Review     Final    Value: Occ Metas and Myelocytes present, few plasma cells, rouleaux  CLOSTRIDIUM DIFFICILE BY PCR     Status: Normal   Collection Time   02/27/12  5:34 PM      Component Value Range Status Comment   C difficile by pcr NEGATIVE  NEGATIVE  Final      Studies/Results: US Renal  02/28/2012   *RADIOLOGY REPORT*  Clinical Data: Renal failure.  RENAL/URINARY TRACT ULTRASOUND COMPLETE  Comparison:  None  Findings:  Right Kidney:  10.5 cm in length.  Age related renal cortical thinning but overall normal echogenicity.  No focal lesions or hydronephrosis.  No renal calculi.  Left Kidney:  9.9 cm in length.  Renal cortical thinning but normal echogenicity.  No hydronephrosis, renal mass or calculi.  Bladder:  Normal  IMPRESSION: Age related renal cortical thinning but no renal mass lesion or hydronephrosis.  Original Report Authenticated By: P. Loralie Champagne, M.D.   Dg Chest Portable 1 View  02/26/2012  *RADIOLOGY REPORT*  Clinical Data: Chest pain.  Short of breath.  PORTABLE CHEST - 1 VIEW  Comparison: 12/19/2011  Findings: Right internal jugular vein Port-A-Cath stable.  No pneumothorax.  Normal heart size.  Low lung volumes with mild basilar atelectasis.  No definite interstitial edema.  IMPRESSION: No active cardiopulmonary disease.  Original Report Authenticated By: Donavan Burnet, M.D.    Medications: Scheduled Meds:   . sodium chloride   Intravenous STAT  .  amitriptyline  20 mg Oral QHS  . furosemide  40 mg Intravenous BID  . furosemide  40 mg Oral BID  . levothyroxine  75 mcg Oral Daily  . metoprolol succinate  50 mg Oral Daily  . pantoprazole  40 mg Oral Q1200  . Pomalidomide  4 mg Oral Q1500  . potassium chloride SA  20 mEq Oral BID  . simvastatin  20 mg Oral QHS  . sodium chloride  3 mL Intravenous Q12H  . DISCONTD: furosemide  40 mg Intravenous BID   Continuous Infusions:  PRN Meds:.sodium chloride, acetaminophen, acetaminophen,  albuterol, heparin lock flush, heparin lock flush, ondansetron (ZOFRAN) IV, ondansetron, sodium chloride, traMADol  Assessment/Plan:   1. Acute on chronic diastolic heart failure (02/26/2012): Patient presented with SOBOE and orthopnea, progressive over abput 2 months, associated with bilateral lower extremity edema. BNP was 5099, cardiac enzymes were un-elevated, effectively, ruling out ACS. D-Dimer is negative. 2D echocardiogram of 02/27/12, showed normal left ventricular cavity size, ejection fraction of 60% to 65% and no regional wall motion abnormalities. Thre was mild mitral  Regurgitation, mildly dilated left atrium and moderately Increased pulmonary artery pressure. PA peak pressure: 57mm Hg (S). Dr Olga Millers provided cardiology consultation, and has opined that some of dyspnea most likely driven by anemia. Patient is currently on Lasix, switched from iv to PO on 02/27/12.  2. Epigastric discomfort (02/26/2012): This was likely secondary to GERD, and has resolved with GI cocktail, followed by PPI.  3. Loose Bowel movement: Patient had loose bowel movements at presentation, and against a background of recent use of antibiotics for pneumonia with avelox and keflex and steroids, the possibility of C. Diff colitis was entertained. Fortunately, C. Diff PCR is negative,. Patient has been reassured accordingly. Now on prn Imodium. 4. Biateral leg Pain: : Likely secondary to significant edema. Venous duplex negative for DVT.   5. Renal insufficiency: This is acute on chronic. Patient's baseline creatinine in March 2013 is 1.1. Creatinine on admission was 1.7. kidney ultrasound shows no evidence of hydronephrosis. Nephrotoxins are being avoided, but creatinine has crept up gradually, since hospitalization, likely due to diuretic therapy. We are following renal indices. 6. Multiple Myeloma: On active chemotherapy. Patient hitherto, has had weekly Blood transfusions for anemia. Thrombocytopenia is stable,  but anemia has worsened. We shall transfuse 1 unit PRBC today, and possibly another one, on 02/29/12. Dr Myrle Sheng is aware of hospitalization.   LOS: 2 days   Karen Barron,Karen Barron 02/28/2012, 9:15 AM

## 2012-02-28 NOTE — Progress Notes (Signed)
78 y.o. female w/ PMHx significant for HTN, HLD, Chronic Diastolic CHF, and Multiple Myeloma who presented to [facility] on 02/26/2012 with complaints of chest pain and dyspnea.   SUBJECTIVE: She continues to complain of dyspnea and general malaise; no chest pain     . sodium chloride   Intravenous STAT  . amitriptyline  20 mg Oral QHS  . furosemide  40 mg Intravenous BID  . furosemide  40 mg Intravenous BID  . levothyroxine  75 mcg Oral Daily  . metoprolol succinate  50 mg Oral Daily  . pantoprazole  40 mg Oral Q1200  . Pomalidomide  4 mg Oral Q1500  . potassium chloride SA  20 mEq Oral BID  . simvastatin  20 mg Oral QHS  . sodium chloride  3 mL Intravenous Q12H      OBJECTIVE: Physical Exam: Filed Vitals:   02/27/12 1400 02/27/12 1530 02/27/12 2129 02/28/12 0509  BP: 111/67  130/49 126/51  Pulse: 93  101 113  Temp: 98.3 F (36.8 C)  98.9 F (37.2 C) 98.8 F (37.1 C)  TempSrc: Oral  Oral Oral  Resp: 18  20 18   Height:      Weight:    73.256 kg (161 lb 8 oz)  SpO2: 100% 94% 100% 100%    Intake/Output Summary (Last 24 hours) at 02/28/12 0753 Last data filed at 02/28/12 0600  Gross per 24 hour  Intake 1122.17 ml  Output   1053 ml  Net  69.17 ml    Telemetry reveals sinus rhythm  GEN- The patient is chronically ill appearing, alert and oriented x 3 today.   Head- normocephalic, atraumatic Neck- supple, JVP 8cm Lungs- Clear to ausculation bilaterally Heart- Regular rate and rhythm,   GI- soft, NT, ND, + BS Extremities- no clubbing, cyanosis, trace edema, SCDs in place Skin- no rash or lesion Neuro- strength and sensation are intact  LABS: Basic Metabolic Panel:  Basename 02/28/12 0550 02/27/12 0131  NA 135 136  K 4.1 4.8  CL 98 101  CO2 23 21  GLUCOSE 129* 120*  BUN 46* 44*  CREATININE 1.81* 1.84*  CALCIUM 9.7 10.0  MG -- --  PHOS -- --   Liver Function Tests:  Basename 02/27/12 0131 02/26/12 1235  AST 12 14  ALT 15 19  ALKPHOS 29*  30*  BILITOT 1.9* 2.0*  PROT 9.5* 10.0*  ALBUMIN 3.0* 3.2*    Basename 02/26/12 1235  LIPASE 47  AMYLASE --   CBC:  Basename 02/28/12 0550 02/26/12 1235  WBC 2.9* 4.0  NEUTROABS -- 2.3  HGB 6.7* 8.6*  HCT 20.9* 26.5*  MCV 97.2 97.4  PLT 14* 18*   Cardiac Enzymes:  Basename 02/27/12 0844 02/27/12 0131 02/26/12 1731  CKTOTAL 39 32 30  CKMB 2.8 2.3 2.1  CKMBINDEX -- -- --  TROPONINI <0.30 <0.30 <0.30   D-Dimer:  Basename 02/26/12 1645  DDIMER <0.22   Thyroid Function Tests:  Basename 02/27/12 0131  TSH 0.351  T4TOTAL --  T3FREE --  THYROIDAB --    RADIOLOGY: Dg Wrist Complete Left  02/01/2012  *RADIOLOGY REPORT*  Clinical Data: Left wrist pain.  LEFT WRIST - COMPLETE 3+ VIEW  Comparison: None.  Findings: Old ulnar styloid fracture.  Degenerative changes at the base of the first metacarpal articulation between the scaphoid and distal carpal row.  Osteopenia.  No acute fracture and no dislocation.  IMPRESSION: No acute bony pathology.  Degenerative changes.  Original Report Authenticated By: Merton Border  Joseph Art, M.D.   Dg Chest Portable 1 View  02/26/2012  *RADIOLOGY REPORT*  Clinical Data: Chest pain.  Short of breath.  PORTABLE CHEST - 1 VIEW  Comparison: 12/19/2011  Findings: Right internal jugular vein Port-A-Cath stable.  No pneumothorax.  Normal heart size.  Low lung volumes with mild basilar atelectasis.  No definite interstitial edema.  IMPRESSION: No active cardiopulmonary disease.  Original Report Authenticated By: Donavan Burnet, M.D.   Dg Shoulder Left  02/01/2012  *RADIOLOGY REPORT*  Clinical Data: Left shoulder pain.  History prior surgery.  LEFT SHOULDER - 2+ VIEW  Comparison: None.  Findings: Left shoulder replacement is identified.  The device is located.  No fracture.  The patient is status post resection of the distal clavicle.  Postoperative change of vertebroplasty is noted. Imaged lung parenchyma demonstrates scattered, small foci of increased density  which may represent methylmethacrylate in pulmonary veins.  IMPRESSION: Left shoulder placement without evidence of complication.  No acute finding.  Original Report Authenticated By: Bernadene Bell. D'ALESSIO, M.D.    ASSESSMENT AND PLAN:  Active Problems:  Acute on chronic diastolic heart failure  Epigastric discomfort   Assessment and Plan:  78 y.o. female w/ PMHx significant for HTN, HLD, Chronic Diastolic CHF, and Multiple Myeloma who presented to [facility] on 02/26/2012 with complaints of chest pain and dyspnea.   1. Dyspnea: Patient presents with ~46mos progressive dyspnea that worsened over the weekend after chemo treatment.  Her dDimer is negative and her echo is unchanged from 8/12.  Her EF is preserved though she has moderate elevation in PA pressured. Change lasix to 40 mg po bid; follow renal function. Some of dyspnea most likely driven by anemia; transfusion per primary service.  2. Chest pain: No h/o CAD by cath 2007. No WMAs by echo . EKG without acute ischemic changes and troponin normal. Symptoms atypical for ACS.  Continue current medical therapy.  3. Acute on Chronic Diastolic CHF:  Change lasix to 40 mg po BID. Monitor renal function and electrolytes closely.   Prognosis appears to be poor. Management of multiple myeloma per hematology oncology.   Olga Millers, MD 02/28/2012 7:53 AM

## 2012-02-28 NOTE — Progress Notes (Signed)
IP PROGRESS NOTE  Subjective:   She was admitted on 02/26/2012 with shortness of breath. She reports partial improvement in shortness of breath since hospital admission. She developed diarrhea beginning yesterday.  Objective: Vital signs in last 24 hours: Blood pressure 126/51, pulse 113, temperature 98.8 F (37.1 C), temperature source Oral, resp. rate 18, height 5\' 4"  (1.626 m), weight 161 lb 8 oz (73.256 kg), SpO2 100.00%.  Intake/Output from previous day: 05/21 0701 - 05/22 0700 In: 1122.2 [P.O.:1002; I.V.:120.2] Out: 1053 [Urine:1051; Stool:2]  Physical Exam:  HEENT: No thrush or bleeding Lungs: Clear bilaterally Cardiac: Regular rate and rhythm Abdomen: Nontender Extremities: No edema Skin: No petechiae  Portacath/PICC-without erythema  Lab Results:  Basename 02/28/12 0550 02/26/12 1235  WBC 2.9* 4.0  HGB 6.7* 8.6*  HCT 20.9* 26.5*  PLT 14* 18*   ANC 2.3 on 02/26/2012 BMET  Basename 02/28/12 0550 02/27/12 0131  NA 135 136  K 4.1 4.8  CL 98 101  CO2 23 21  GLUCOSE 129* 120*  BUN 46* 44*  CREATININE 1.81* 1.84*  CALCIUM 9.7 10.0    Studies/Results: US Renal  02/28/2012   *RADIOLOGY REPORT*  Clinical Data: Renal failure.  RENAL/URINARY TRACT ULTRASOUND COMPLETE  Comparison:  None  Findings:  Right Kidney:  10.5 cm in length.  Age related renal cortical thinning but overall normal echogenicity.  No focal lesions or hydronephrosis.  No renal calculi.  Left Kidney:  9.9 cm in length.  Renal cortical thinning but normal echogenicity.  No hydronephrosis, renal mass or calculi.  Bladder:  Normal  IMPRESSION: Age related renal cortical thinning but no renal mass lesion or hydronephrosis.  Original Report Authenticated By: P. Loralie Champagne, M.D.   Dg Chest Portable 1 View  02/26/2012  *RADIOLOGY REPORT*  Clinical Data: Chest pain.  Short of breath.  PORTABLE CHEST - 1 VIEW  Comparison: 12/19/2011  Findings: Right internal jugular vein Port-A-Cath stable.  No  pneumothorax.  Normal heart size.  Low lung volumes with mild basilar atelectasis.  No definite interstitial edema.  IMPRESSION: No active cardiopulmonary disease.  Original Report Authenticated By: Donavan Burnet, M.D.    Medications: I have reviewed the patient's current medications.  Assessment/Plan:  1. Refractory IgM multiple myeloma-now being treated with salvage pomalidomide/Decadron, the pomalidomide was started on 02/24/2012. 2. Pancytopenia secondary to multiple myeloma-status post repeat red blood cell transfusions. The severe thrombocytopenia predated the use of pomalidomide 3. Chronic back pain secondary to multiple myeloma and compression fractures 4. Renal insufficiency-? Secondary to multiple myeloma 5. Diastolic heart failure 6. History of a herpes zoster rash 7. Dyspnea secondary to anemia and diastolic heart failure  [name] has advanced stage multiple myeloma that has progressed despite multiple systemic regimens. She started a trial of salvage therapy with pomalidomide/Decadron last week. The pancytopenia is due to the advanced myeloma, though I expect worsening of the cytopenias with pomalidomide therapy.  Recommendations: 1. Transfuse with packed red blood cells 2. Check CBC on 02/29/2012 and consider holding or dose reducing the pomalidomide if the platelets fall further 3. Continue weekly Decadron-due on 03/01/2012 4. I will discuss the situation with her husband. 5. And Dilaudid for breakthrough pain-she takes this chronically.  Please call as needed. I will see her again on may 23rd 2013.    LOS: 2 days   [name], [name]  02/28/2012, 11:53 AM

## 2012-02-28 NOTE — Progress Notes (Signed)
OT Cancellation Note  Treatment cancelled today due to medical issues with patient which prohibited therapy.  Pt with hemoglobin of 6.7 and now receiving blood transfusion.  Pt politely declined attempt of therapy today.  Will check back 02/29/12 and re-attempt OT eval if pt is medically OK to participate.  Desirai Traxler 02/28/2012, 3:23 PM

## 2012-02-29 DIAGNOSIS — R1013 Epigastric pain: Secondary | ICD-10-CM

## 2012-02-29 DIAGNOSIS — I5031 Acute diastolic (congestive) heart failure: Secondary | ICD-10-CM

## 2012-02-29 DIAGNOSIS — R0609 Other forms of dyspnea: Secondary | ICD-10-CM

## 2012-02-29 DIAGNOSIS — I5030 Unspecified diastolic (congestive) heart failure: Secondary | ICD-10-CM

## 2012-02-29 DIAGNOSIS — N289 Disorder of kidney and ureter, unspecified: Secondary | ICD-10-CM

## 2012-02-29 DIAGNOSIS — N179 Acute kidney failure, unspecified: Secondary | ICD-10-CM

## 2012-02-29 LAB — BASIC METABOLIC PANEL
CO2: 21 mEq/L (ref 19–32)
Calcium: 9.4 mg/dL (ref 8.4–10.5)
Potassium: 4.3 mEq/L (ref 3.5–5.1)
Sodium: 133 mEq/L — ABNORMAL LOW (ref 135–145)

## 2012-02-29 LAB — DIFFERENTIAL
Basophils Absolute: 0 10*3/uL (ref 0.0–0.1)
Basophils Relative: 0 % (ref 0–1)
Eosinophils Absolute: 0 10*3/uL (ref 0.0–0.7)
Lymphocytes Relative: 38 % (ref 12–46)
Monocytes Relative: 11 % (ref 3–12)
Neutrophils Relative %: 50 % (ref 43–77)
Smear Review: DECREASED

## 2012-02-29 LAB — CBC
Hemoglobin: 7.3 g/dL — ABNORMAL LOW (ref 12.0–15.0)
MCHC: 32.2 g/dL (ref 30.0–36.0)
RDW: 21.8 % — ABNORMAL HIGH (ref 11.5–15.5)
WBC: 2.9 10*3/uL — ABNORMAL LOW (ref 4.0–10.5)

## 2012-02-29 LAB — PRO B NATRIURETIC PEPTIDE: Pro B Natriuretic peptide (BNP): 8427 pg/mL — ABNORMAL HIGH (ref 0–450)

## 2012-02-29 MED ORDER — FUROSEMIDE 10 MG/ML IJ SOLN
20.0000 mg | Freq: Once | INTRAMUSCULAR | Status: AC
  Administered 2012-02-29: 15:00:00 via INTRAVENOUS
  Filled 2012-02-29: qty 2

## 2012-02-29 NOTE — Progress Notes (Signed)
Subjective: Feels marginally better.  Objective: Vital signs in last 24 hours: Temp:  [97.6 F (36.4 C)-99.4 F (37.4 C)] 97.6 F (36.4 C) (05/23 0502) Pulse Rate:  [96-106] 98  (05/23 1101) Resp:  [18-20] 18  (05/23 0502) BP: (100-126)/(44-63) 112/61 mmHg (05/23 1101) SpO2:  [99 %-100 %] 100 % (05/23 1143) Weight:  [74.571 kg (164 lb 6.4 oz)] 74.571 kg (164 lb 6.4 oz) (05/23 0502) Weight change: 1.315 kg (2 lb 14.4 oz) Last BM Date: 02/28/12  Intake/Output from previous day: 05/22 0701 - 05/23 0700 In: 1220 [P.O.:720; I.V.:500] Out: 850 [Urine:850] Total I/O In: 120 [P.O.:120] Out: 100 [Urine:100]   Physical Exam: General: Comfortable, in chair. Alert, communicative, fully oriented, not short of breath at rest.  HEENT:  Moderate clinical pallor, no jaundice, no conjunctival injection or discharge.  NECK:  Supple, JVP not seen, no carotid bruits, no palpable lymphadenopathy, no palpable goiter. CHEST:  Clinically clear to auscultation, no wheezes, no crackles. HEART:  Sounds 1 and 2 heard, normal, regular, mildly tachycardic, no murmurs. ABDOMEN:  Full, soft, non-tender, no palpable organomegaly, no palpable masses, normal bowel sounds. GENITALIA:  Not examined. LOWER EXTREMITIES:  Less pitting edema, palpable peripheral pulses. Extensive varicosities. Some petechiae between toes. MUSCULOSKELETAL SYSTEM:  Generalized osteoarthritic changes, otherwise, normal. CENTRAL NERVOUS SYSTEM:  No focal neurologic deficit on gross examination.  Lab Results:  Basename 02/29/12 0610 02/28/12 0550  WBC 2.9* 2.9*  HGB 7.3* 6.7*  HCT 22.7* 20.9*  PLT 14* 14*    Basename 02/29/12 0610 02/28/12 0550  NA 133* 135  K 4.3 4.1  CL 96 98  CO2 21 23  GLUCOSE 120* 129*  BUN 49* 46*  CREATININE 1.82* 1.81*  CALCIUM 9.4 9.7   Recent Results (from the past 240 hour(s))  TECHNOLOGIST REVIEW     Status: Normal   Collection Time   02/22/12 10:37 AM      Component Value Range Status  Comment   Technologist Review     Final    Value: Occ Metas and Myelocytes present, few plasma cells, rouleaux  CLOSTRIDIUM DIFFICILE BY PCR     Status: Normal   Collection Time   02/27/12  5:34 PM      Component Value Range Status Comment   C difficile by pcr NEGATIVE  NEGATIVE  Final      Studies/Results: US Renal  02/28/2012   *RADIOLOGY REPORT*  Clinical Data: Renal failure.  RENAL/URINARY TRACT ULTRASOUND COMPLETE  Comparison:  None  Findings:  Right Kidney:  10.5 cm in length.  Age related renal cortical thinning but overall normal echogenicity.  No focal lesions or hydronephrosis.  No renal calculi.  Left Kidney:  9.9 cm in length.  Renal cortical thinning but normal echogenicity.  No hydronephrosis, renal mass or calculi.  Bladder:  Normal  IMPRESSION: Age related renal cortical thinning but no renal mass lesion or hydronephrosis.  Original Report Authenticated By: P. Loralie Champagne, M.D.    Medications: Scheduled Meds:    . amitriptyline  20 mg Oral QHS  . furosemide  40 mg Oral BID  . levothyroxine  75 mcg Oral Daily  . metoprolol succinate  50 mg Oral Daily  . pantoprazole  40 mg Oral Q1200  . Pomalidomide  4 mg Oral Q1500  . potassium chloride SA  20 mEq Oral BID  . simvastatin  20 mg Oral QHS  . sodium chloride  3 mL Intravenous Q12H   Continuous Infusions:  PRN Meds:.sodium chloride, acetaminophen, acetaminophen,  albuterol, heparin lock flush, heparin lock flush, HYDROmorphone, ondansetron (ZOFRAN) IV, ondansetron, sodium chloride, traMADol  Assessment/Plan:   1. Acute on chronic diastolic heart failure (02/26/2012): Patient presented with SOBOE and orthopnea, progressive over about 2 months, associated with bilateral lower extremity edema. BNP was 5099, cardiac enzymes were un-elevated, effectively, ruling out ACS. D-Dimer is negative. 2D echocardiogram of 02/27/12, showed normal left ventricular cavity size, ejection fraction of 60% to 65% and no regional wall motion  abnormalities. Thre was mild mitral  Regurgitation, mildly dilated left atrium and moderately Increased pulmonary artery pressure. PA peak pressure: 57mm Hg (S). Dr Olga Millers provided cardiology consultation, and has opined that some of dyspnea most likely driven by anemia. Patient is currently on Lasix, switched from iv to PO on 02/27/12.  2. Epigastric discomfort (02/26/2012): This was likely secondary to GERD, and has resolved with GI cocktail, followed by PPI.  3. Loose Bowel movement: Patient had loose bowel movements at presentation, and against a background of recent use of antibiotics for pneumonia with avelox and keflex and steroids, the possibility of C. Diff colitis was entertained. Fortunately, C. Diff PCR is negative,. Patient has been reassured accordingly. Now on prn Imodium, and diarrhea has resolved.  4. Biateral leg Pain: : Likely secondary to significant edema. Venous duplex negative for DVT. No longer problematic, as of 02/29/12.  5. Renal insufficiency: This is acute on chronic. Patient's baseline creatinine in March 2013 is 1.1. Creatinine on admission was 1.7. kidney ultrasound shows no evidence of hydronephrosis. Nephrotoxins are being avoided, but creatinine has crept up gradually, since hospitalization, likely due to diuretic therapy. We are following renal indices. Perhaps, we should consider reducing Lasix further, but will defer to cardiology. 6. Multiple Myeloma: This is refractory IgM multiple myeloma, now being treated with salvage Pomalidomide/Decadron. The pomalidomide was started on 02/24/2012. Patient has a pancytopenia secondary to multiple myeloma and has had weekly blood transfusions for anemia, certainly, in the past one month. Thrombocytopenia is stable,and predated initiation of Pomalidomide.  Anemia continues to be a persistent problem, and patient was transfused one unit PRBC on 02/28/12, for a hemoglobin of 6.7. Today, hemoglobin is 7.3. We shall transfuse prn, to  goal hemoglobin of 8.0. Dr Myrle Sheng provided hematology/oncology consultation.   LOS: 3 days   Kristian Hazzard,CHRISTOPHER 02/29/2012, 1:54 PM

## 2012-02-29 NOTE — Progress Notes (Signed)
IP PROGRESS NOTE  Subjective:   The shortness of breath is better. Only 2 episodes of diarrhea yesterday. No bleeding.  Objective: Vital signs in last 24 hours: Blood pressure 115/44, pulse 106, temperature 97.6 F (36.4 C), temperature source Oral, resp. rate 18, height 5\' 4"  (1.626 m), weight 164 lb 6.4 oz (74.571 kg), SpO2 100.00%.  Intake/Output from previous day: 05/22 0701 - 05/23 0700 In: 1220 [P.O.:720; I.V.:500] Out: 850 [Urine:850]  Physical Exam:  HEENT: No thrush or bleeding,? Scleral icterus Lungs: Clear decreased breath sounds at the lower posterior chest bilaterally with end inspiratory rhonchi Cardiac: Regular rate and rhythm Abdomen: Mild diffuse tenderness Extremities: Trace low pretibial and ankle edema bilaterally Skin: No petechiae  Portacath/PICC-without erythema  Lab Results:  Basename 02/28/12 0550 02/26/12 1235  WBC 2.9* 4.0  HGB 6.7* 8.6*  HCT 20.9* 26.5*  PLT 14* 18*   ANC 2.3 on 02/26/2012 BMET  Basename 02/28/12 0550 02/27/12 0131  NA 135 136  K 4.1 4.8  CL 98 101  CO2 23 21  GLUCOSE 129* 120*  BUN 46* 44*  CREATININE 1.81* 1.84*  CALCIUM 9.7 10.0    Studies/Results: US Renal  02/28/2012   *RADIOLOGY REPORT*  Clinical Data: Renal failure.  RENAL/URINARY TRACT ULTRASOUND COMPLETE  Comparison:  None  Findings:  Right Kidney:  10.5 cm in length.  Age related renal cortical thinning but overall normal echogenicity.  No focal lesions or hydronephrosis.  No renal calculi.  Left Kidney:  9.9 cm in length.  Renal cortical thinning but normal echogenicity.  No hydronephrosis, renal mass or calculi.  Bladder:  Normal  IMPRESSION: Age related renal cortical thinning but no renal mass lesion or hydronephrosis.  Original Report Authenticated By: P. Loralie Champagne, M.D.    Medications: I have reviewed the patient's current medications.  Assessment/Plan:  1. Refractory IgM multiple myeloma-now being treated with salvage pomalidomide/Decadron,  the pomalidomide was started on 02/24/2012. 2. Pancytopenia secondary to multiple myeloma-status post repeat red blood cell transfusions. The severe thrombocytopenia predated the use of pomalidomide. She received a red cell transfusion on 02/28/2012. 3. Chronic back pain secondary to multiple myeloma and compression fractures 4. Renal insufficiency-? Secondary to multiple myeloma, the increased creatinine during this admission is likely related to diuresis. 5. Diastolic heart failure 6. History of a herpes zoster rash 7. Dyspnea secondary to anemia and diastolic heart failure-improved during this hospital admission  She appears stable.  Recommendations: 1. continue pomalidomide and weekly Decadron 2. Followup CBC and transfuse additional packed red blood cells for a hemoglobin of less than 8 3. Management of volume status per cardiology  I discussed the situation with her husband yesterday. I will see her again on may 24th 2013.    LOS: 3 days   Branden Vine  02/29/2012, 8:23 AM

## 2012-02-29 NOTE — Progress Notes (Signed)
78 y.o. female w/ PMHx significant for HTN, HLD, Chronic Diastolic CHF, and Multiple Myeloma who presented to [facility] on 02/26/2012 with complaints of chest pain and dyspnea.   SUBJECTIVE: Dyspnea has improved some; no chest pain; general malaise persists     . amitriptyline  20 mg Oral QHS  . furosemide  20 mg Intravenous Once  . furosemide  40 mg Oral BID  . levothyroxine  75 mcg Oral Daily  . metoprolol succinate  50 mg Oral Daily  . pantoprazole  40 mg Oral Q1200  . Pomalidomide  4 mg Oral Q1500  . potassium chloride SA  20 mEq Oral BID  . simvastatin  20 mg Oral QHS  . sodium chloride  3 mL Intravenous Q12H  . DISCONTD: furosemide  40 mg Intravenous BID      OBJECTIVE: Physical Exam: Filed Vitals:   02/28/12 1424 02/28/12 1645 02/28/12 2116 02/29/12 0502  BP: 100/63 126/60 117/51 115/44  Pulse: 96 98 100 106  Temp: 98.5 F (36.9 C) 98.7 F (37.1 C) 99.4 F (37.4 C) 97.6 F (36.4 C)  TempSrc: Oral Oral Oral Oral  Resp: 18 20 18 18   Height:      Weight:    74.571 kg (164 lb 6.4 oz)  SpO2: 99%  100% 100%    Intake/Output Summary (Last 24 hours) at 02/29/12 0805 Last data filed at 02/29/12 0700  Gross per 24 hour  Intake   1220 ml  Output    850 ml  Net    370 ml    Telemetry reveals sinus rhythm  GEN- The patient is chronically ill appearing, alert and oriented x 3 today.   Head- normocephalic, atraumatic Neck- supple, JVP 8cm Lungs- Clear to ausculation bilaterally Heart- Regular rate and rhythm,   GI- soft, NT, ND, + BS Extremities- 1+ edema Skin- no rash or lesion Neuro- strength and sensation are intact  LABS: Basic Metabolic Panel:  Basename 02/28/12 0550 02/27/12 0131  NA 135 136  K 4.1 4.8  CL 98 101  CO2 23 21  GLUCOSE 129* 120*  BUN 46* 44*  CREATININE 1.81* 1.84*  CALCIUM 9.7 10.0  MG -- --  PHOS -- --   Liver Function Tests:  Basename 02/27/12 0131 02/26/12 1235  AST 12 14  ALT 15 19  ALKPHOS 29* 30*  BILITOT  1.9* 2.0*  PROT 9.5* 10.0*  ALBUMIN 3.0* 3.2*    Basename 02/26/12 1235  LIPASE 47  AMYLASE --   CBC:  Basename 02/28/12 0550 02/26/12 1235  WBC 2.9* 4.0  NEUTROABS -- 2.3  HGB 6.7* 8.6*  HCT 20.9* 26.5*  MCV 97.2 97.4  PLT 14* 18*   Cardiac Enzymes:  Basename 02/27/12 0844 02/27/12 0131 02/26/12 1731  CKTOTAL 39 32 30  CKMB 2.8 2.3 2.1  CKMBINDEX -- -- --  TROPONINI <0.30 <0.30 <0.30   D-Dimer:  Basename 02/26/12 1645  DDIMER <0.22   Thyroid Function Tests:  Basename 02/27/12 0131  TSH 0.351  T4TOTAL --  T3FREE --  THYROIDAB --    RADIOLOGY: Dg Wrist Complete Left  02/01/2012  *RADIOLOGY REPORT*  Clinical Data: Left wrist pain.  LEFT WRIST - COMPLETE 3+ VIEW  Comparison: None.  Findings: Old ulnar styloid fracture.  Degenerative changes at the base of the first metacarpal articulation between the scaphoid and distal carpal row.  Osteopenia.  No acute fracture and no dislocation.  IMPRESSION: No acute bony pathology.  Degenerative changes.  Original Report Authenticated By:  Donavan Burnet, M.D.   Dg Chest Portable 1 View  02/26/2012  *RADIOLOGY REPORT*  Clinical Data: Chest pain.  Short of breath.  PORTABLE CHEST - 1 VIEW  Comparison: 12/19/2011  Findings: Right internal jugular vein Port-A-Cath stable.  No pneumothorax.  Normal heart size.  Low lung volumes with mild basilar atelectasis.  No definite interstitial edema.  IMPRESSION: No active cardiopulmonary disease.  Original Report Authenticated By: Donavan Burnet, M.D.   Dg Shoulder Left  02/01/2012  *RADIOLOGY REPORT*  Clinical Data: Left shoulder pain.  History prior surgery.  LEFT SHOULDER - 2+ VIEW  Comparison: None.  Findings: Left shoulder replacement is identified.  The device is located.  No fracture.  The patient is status post resection of the distal clavicle.  Postoperative change of vertebroplasty is noted. Imaged lung parenchyma demonstrates scattered, small foci of increased density which may  represent methylmethacrylate in pulmonary veins.  IMPRESSION: Left shoulder placement without evidence of complication.  No acute finding.  Original Report Authenticated By: Bernadene Bell. D'ALESSIO, M.D.    ASSESSMENT AND PLAN:  Active Problems:  Acute on chronic diastolic heart failure  Epigastric discomfort   Assessment and Plan:  78 y.o. female w/ PMHx significant for HTN, HLD, Chronic Diastolic CHF, and Multiple Myeloma who presented to [facility] on 02/26/2012 with complaints of chest pain and dyspnea.   1. Dyspnea: Patient presents with ~4mos progressive dyspnea that worsened over the weekend after chemo treatment. Her dDimer is negative and her echo is unchanged from 8/12.  Her EF is preserved though she has moderate elevation in PA pressured. Continue lasix 40 mg po bid; follow renal function. Some of dyspnea most likely driven by anemia; transfusion per primary service.  2. Chest pain: No h/o CAD by cath 2007. No WMAs by echo . EKG without acute ischemic changes and troponin normal. Symptoms atypical for ACS.  Continue current medical therapy.  3. Acute on Chronic Diastolic CHF:  Continue lasix 40 mg po BID. Monitor renal function and electrolytes closely. (labs pending this AM)  Prognosis appears to be poor. Management of multiple myeloma per hematology oncology.   Karen Millers, MD 02/29/2012 8:05 AM

## 2012-02-29 NOTE — Progress Notes (Signed)
UR Completed.  

## 2012-02-29 NOTE — Evaluation (Signed)
Occupational Therapy Evaluation Patient Details Name: Karen Barron MRN: [id number] DOB: Sep 07, 1926 Today's Date: 02/29/2012 Time: 1478-2956 OT Time Calculation (min): 30 min  OT Assessment / Plan / Recommendation Clinical Impression  Pt admitted for SOB, chest pain with a h/o MM with chemotherapy with deficits in areas of pain, strength, activity tolerance and balance all affecting indepenence with basic adls.  Pt has deficits listed below and would benefit from cont OT to increase I with adls to baseline so she can safely d/c home with husband.    OT Assessment  Patient needs continued OT Services    Follow Up Recommendations  Home health OT    Barriers to Discharge      Equipment Recommendations  None recommended by OT    Recommendations for Other Services    Frequency  Min 2X/week    Precautions / Restrictions Precautions Precautions: Fall Precaution Comments: Pt's hemoglobin at 7.3.  Pt w chronic anemia.  Pt agreed to do what she could. Restrictions Weight Bearing Restrictions: No   Pertinent Vitals/Pain Pt with 8/10 pain in back and L shoulder.  Pain meds given by nurse during treatment.    ADL  Eating/Feeding: Performed;Set up Where Assessed - Eating/Feeding: Chair Grooming: Performed;Minimal assistance Where Assessed - Grooming: Supported sitting Upper Body Bathing: Simulated;Set up;Other (comment) (extra time needed.) Where Assessed - Upper Body Bathing: Supported sitting Lower Body Bathing: Simulated;Moderate assistance Where Assessed - Lower Body Bathing: Supported sit to stand Upper Body Dressing: Simulated;Moderate assistance Where Assessed - Upper Body Dressing: Supported sitting Lower Body Dressing: Simulated;Moderate assistance Where Assessed - Lower Body Dressing: Sopported sit to stand Toilet Transfer: Performed;Minimal assistance Toilet Transfer Method: Stand pivot Toilet Transfer Equipment: Bedside commode Toileting - Clothing Manipulation and  Hygiene: Simulated;Minimal assistance Where Assessed - Engineer, mining and Hygiene: Standing Transfers/Ambulation Related to ADLs: Pt unsteady and weak.  min assist to stand due to significant pain in L shoulder when pushing up sit tos tand. ADL Comments: Pt with assist needed for LE adls.  pt unable to pull legs to her and cannot reach feet. When stronger pt may benfit from AE.    OT Diagnosis: Generalized weakness;Acute pain  OT Problem List: Decreased strength;Decreased range of motion;Decreased activity tolerance;Impaired balance (sitting and/or standing);Decreased knowledge of use of DME or AE;Pain OT Treatment Interventions: Self-care/ADL training;DME and/or AE instruction   OT Goals Acute Rehab OT Goals OT Goal Formulation: With patient/family Time For Goal Achievement: 03/14/12 Potential to Achieve Goals: Fair ADL Goals Pt Will Perform Grooming: with supervision;Standing at sink ADL Goal: Grooming - Progress: Goal set today Pt Will Perform Lower Body Bathing: with supervision;with adaptive equipment;Sit to stand from chair ADL Goal: Lower Body Bathing - Progress: Goal set today Pt Will Perform Lower Body Dressing: with supervision;with adaptive equipment;Sit to stand from chair ADL Goal: Lower Body Dressing - Progress: Goal set today Additional ADL Goal #1: Pt will complete all aspects of toileting on comfort hight commode with rails and S. ADL Goal: Additional Goal #1 - Progress: Goal set today Arm Goals Additional Arm Goal #1: Pt will be proficient in SROM of L shoulder to increase ROM in shoulder to assist with UE adls. Arm Goal: Additional Goal #1 - Progress: Goal set today  Visit Information  Last OT Received On: 02/29/12 Assistance Needed: +1    Subjective Data  Subjective: "I don't feel up to much" Patient Stated Goal: to get stronger.   Prior Functioning  Home Living Lives With: Spouse;Daughter Available Help at  Discharge: Family;Available 24  hours/day Type of Home: House Home Access: Level entry Home Layout: One level Bathroom Shower/Tub: Health visitor: Handicapped height Bathroom Accessibility: Yes How Accessible: Accessible via wheelchair Home Adaptive Equipment: Grab bars around toilet;Grab bars in shower;Built-in shower seat;Walker - rolling Additional Comments: Husband reports she doesn't use the walker but that she just furniture walks and has husband next to her Prior Function Level of Independence: Needs assistance Needs Assistance: Dressing;Meal Prep;Light Housekeeping;Bathing Bath: Minimal Dressing: Minimal Meal Prep: Total Light Housekeeping: Total Able to Take Stairs?: No Driving: No Vocation: Retired Comments: Pt requires assist with bra/socks and shoes and occasional assist reaching feet during shower. Communication Communication: No difficulties Dominant Hand: Right    Cognition  Overall Cognitive Status: Appears within functional limits for tasks assessed/performed Arousal/Alertness: Awake/alert Orientation Level: Oriented X4 / Intact Behavior During Session: [facility] for tasks performed Cognition - Other Comments: intact.    Extremity/Trunk Assessment Right Upper Extremity Assessment RUE ROM/Strength/Tone: Deficits RUE ROM/Strength/Tone Deficits: shoulder 3/5, biceps/triceps 3+/5, grip 3/5 RUE Sensation: WFL - Light Touch RUE Coordination: WFL - gross/fine motor Left Upper Extremity Assessment LUE ROM/Strength/Tone: Deficits LUE ROM/Strength/Tone Deficits: shoulder 2/5 (old shoulder replacement w chronic pain) biceps/triceps 3/5  grip 2/5 LUE Sensation: WFL - Light Touch LUE Coordination: WFL - gross/fine motor   Mobility Transfers Transfers: Sit to Stand;Stand to Sit Sit to Stand: 4: Min assist;With upper extremity assist;From chair/3-in-1 Stand to Sit: 4: Min guard;To chair/3-in-1;With armrests;With upper extremity assist Details for Transfer Assistance: Pt very weak. Required  assist to get into full standing from the R side.  L shoulder so painful she cannot even push up from chair with it.   Exercise    Balance Balance Balance Assessed: No  End of Session OT - End of Session Activity Tolerance: Patient limited by fatigue;Patient limited by pain Patient left: in chair;with call bell/phone within reach;with family/visitor present Nurse Communication: Mobility status;Patient requests pain meds;Other (comment) (reported 8/10 pain in L shoulder.)   Hope Budds 02/29/2012, 11:59 AM [phone]

## 2012-03-01 DIAGNOSIS — I509 Heart failure, unspecified: Secondary | ICD-10-CM

## 2012-03-01 DIAGNOSIS — I5031 Acute diastolic (congestive) heart failure: Secondary | ICD-10-CM

## 2012-03-01 DIAGNOSIS — R0602 Shortness of breath: Secondary | ICD-10-CM

## 2012-03-01 DIAGNOSIS — N179 Acute kidney failure, unspecified: Secondary | ICD-10-CM

## 2012-03-01 DIAGNOSIS — R5383 Other fatigue: Secondary | ICD-10-CM

## 2012-03-01 DIAGNOSIS — C9000 Multiple myeloma not having achieved remission: Secondary | ICD-10-CM

## 2012-03-01 DIAGNOSIS — R1013 Epigastric pain: Secondary | ICD-10-CM

## 2012-03-01 LAB — BASIC METABOLIC PANEL
Calcium: 9.4 mg/dL (ref 8.4–10.5)
Creatinine, Ser: 1.96 mg/dL — ABNORMAL HIGH (ref 0.50–1.10)
GFR calc Af Amer: 26 mL/min — ABNORMAL LOW (ref >90)
GFR calc non Af Amer: 22 mL/min — ABNORMAL LOW (ref >90)

## 2012-03-01 LAB — CBC
MCH: 31.8 pg (ref 26.0–34.0)
MCV: 92.9 fL (ref 78.0–100.0)
Platelets: 15 10*3/uL — CL (ref 150–400)
RDW: 20.8 % — ABNORMAL HIGH (ref 11.5–15.5)

## 2012-03-01 LAB — HEPATIC FUNCTION PANEL
Bilirubin, Direct: 0.9 mg/dL — ABNORMAL HIGH (ref 0.0–0.3)
Indirect Bilirubin: 3.5 mg/dL — ABNORMAL HIGH (ref 0.3–0.9)

## 2012-03-01 LAB — PREPARE RBC (CROSSMATCH)

## 2012-03-01 LAB — PRO B NATRIURETIC PEPTIDE: Pro B Natriuretic peptide (BNP): 10322 pg/mL — ABNORMAL HIGH (ref 0–450)

## 2012-03-01 MED ORDER — DEXAMETHASONE 6 MG PO TABS
40.0000 mg | ORAL_TABLET | Freq: Once | ORAL | Status: AC
  Administered 2012-03-02: 40 mg via ORAL
  Filled 2012-03-01: qty 1

## 2012-03-01 MED ORDER — ENSURE COMPLETE PO LIQD
237.0000 mL | Freq: Two times a day (BID) | ORAL | Status: DC
  Administered 2012-03-03 – 2012-03-06 (×6): 237 mL via ORAL

## 2012-03-01 MED ORDER — DOCUSATE SODIUM 100 MG PO CAPS
100.0000 mg | ORAL_CAPSULE | Freq: Two times a day (BID) | ORAL | Status: DC
  Administered 2012-03-01 – 2012-03-11 (×17): 100 mg via ORAL
  Filled 2012-03-01 (×22): qty 1

## 2012-03-01 MED ORDER — FUROSEMIDE 40 MG PO TABS
40.0000 mg | ORAL_TABLET | Freq: Every day | ORAL | Status: DC
  Filled 2012-03-01: qty 1

## 2012-03-01 MED ORDER — SODIUM CHLORIDE 0.9 % IJ SOLN
10.0000 mL | INTRAMUSCULAR | Status: DC | PRN
  Administered 2012-03-01 – 2012-03-02 (×2): 10 mL

## 2012-03-01 NOTE — Progress Notes (Signed)
IP PROGRESS NOTE  Subjective:   She continues to feel "weak". Small amount of blood at the nostrils. No other bleeding. No diarrhea.  Objective: Vital signs in last 24 hours: Blood pressure 100/60, pulse 96, temperature 98.4 F (36.9 C), temperature source Oral, resp. rate 18, height 5\' 4"  (1.626 m), weight 165 lb 12.6 oz (75.2 kg), SpO2 97.00%.  Intake/Output from previous day: 05/23 0701 - 05/24 0700 In: 2090.3 [P.O.:1052; I.V.:740; Blood:298.3] Out: 850 [Urine:850]  Physical Exam:  HEENT: No thrush or bleeding,? Scleral and subungual icterus Lungs:  decreased breath sounds at the lower posterior chest bilaterally with end inspiratory rhonchi Cardiac: Regular rate and rhythm Abdomen: Mild diffuse tenderness Extremities: Trace low pretibial and ankle edema bilaterally Skin: No petechiae  Portacath/PICC-without erythema  Lab Results:  Basename 03/01/12 0530 02/29/12 0610  WBC 3.5* 2.9*  HGB 8.1* 7.3*  HCT 23.7* 22.7*  PLT 15* 14*   ANC 2.3 on 02/26/2012 BMET  Basename 03/01/12 0530 02/29/12 0610  NA 131* 133*  K 4.6 4.3  CL 95* 96  CO2 21 21  GLUCOSE 128* 120*  BUN 54* 49*  CREATININE 1.96* 1.82*  CALCIUM 9.4 9.4    Studies/Results: No results found.  Medications: I have reviewed the patient's current medications.  Assessment/Plan:  1. Refractory IgM multiple myeloma-now being treated with salvage pomalidomide/Decadron, the pomalidomide was started on 02/24/2012. 2. Pancytopenia secondary to multiple myeloma-status post repeat red blood cell transfusions. The severe thrombocytopenia predated the use of pomalidomide. She received a red cell transfusion on 02/29/2012. 3. Chronic back pain secondary to multiple myeloma and compression fractures 4. Renal insufficiency-? Secondary to multiple myeloma, the increased creatinine during this admission is likely related to diuresis. 5. Diastolic heart failure 6. History of a herpes zoster rash 7. Dyspnea secondary  to anemia and diastolic heart failure-improved during this hospital admission  She remains weak and does not feel ready to go home.  Recommendations: 1. continue pomalidomide and weekly Decadron-I will order a dose of Decadron for may 25th 2013. 2. transfuse 1 additional unit of packed red blood cells 3. Management of volume status per cardiology 4. Check CBC on may 26th 2013-transfuse platelets for bleeding or a count of less than 10,000. 5. Check liver panel-? Jaundice   I discussed the current situation with the patient and her daughter. The plan is to continue pomalidomide and weekly Decadron. Hopefully the anemia and thrombocytopenia will improve over the next few weeks. If not we will need to consider transitioning to a supportive care/hospice approach.  Please call oncology as needed over the weekend. I will see her on may 27th 2013.      LOS: 4 days   Sheralee Qazi, [name]  03/01/2012, 8:49 AM

## 2012-03-01 NOTE — Progress Notes (Signed)
INITIAL ADULT NUTRITION ASSESSMENT Date: 03/01/2012   Time: 3:15 PM Reason for Assessment: poor po intake   ASSESSMENT: Female 78 y.o.  Dx: CHF, multiple myeloma   Hx:  Past Medical History  Diagnosis Date  . Personal history of colonic polyps   . Osteoporosis   . Waldenstrom's macroglobulinemia   . Diverticular disease   . Thyroid disease   . Fatty liver   . Hypertension   . Diabetes in pregnancy   . Hyperlipidemia   . Spinal stenosis   . Asthma   . Anemia   . DJD (degenerative joint disease)   . Esophageal reflux   . Hiatal hernia   . Chronic diastolic CHF (congestive heart failure)     Echo on 05/30/11 revealed nl LV systolic function, EF 65-70%, no WMAs, grade 2 diastolic dysfunction, mildly dilated RA/RV, peak PA pressure  . Multiple myeloma in remission     Related Meds:     . amitriptyline  20 mg Oral QHS  . dexamethasone  40 mg Oral Once  . furosemide  20 mg Intravenous Once  . furosemide  40 mg Oral Daily  . levothyroxine  75 mcg Oral Daily  . metoprolol succinate  50 mg Oral Daily  . pantoprazole  40 mg Oral Q1200  . Pomalidomide  4 mg Oral Q1500  . potassium chloride SA  20 mEq Oral BID  . simvastatin  20 mg Oral QHS  . sodium chloride  3 mL Intravenous Q12H  . DISCONTD: furosemide  40 mg Oral BID     Ht: 6' (182.9 cm)  Wt: 165 lb 12.6 oz (75.2 kg) (Scale C.)  Ideal Wt: 72.7 kg % Ideal Wt: 103%  Usual Wt:  Wt Readings from Last 15 Encounters:  03/01/12 165 lb 12.6 oz (75.2 kg)  02/22/12 161 lb 9.6 oz (73.301 kg)  02/20/12 163 lb (73.936 kg)  02/15/12 161 lb (73.029 kg)  02/02/12 161 lb 12.8 oz (73.392 kg)  01/31/12 161 lb 12.8 oz (73.392 kg)  01/22/12 165 lb 6.4 oz (75.025 kg)  12/19/11 163 lb 1.6 oz (73.982 kg)  12/13/11 163 lb 12.8 oz (74.299 kg)  11/15/11 167 lb 12.8 oz (76.114 kg)  10/18/11 167 lb 8 oz (75.978 kg)  09/26/11 169 lb 9.6 oz (76.93 kg)  09/06/11 168 lb (76.204 kg)  08/07/11 168 lb (76.204 kg)  06/20/11 174 lb  (78.926 kg)   % Usual Wt: 98%  Body mass index is 22.48 kg/(m^2). WNL  Food/Nutrition Related Hx: No nutrition problems indicated per nutrition risk assessment   Labs:  CMP     Component Value Date/Time   NA 131* 03/01/2012 0530   NA 136 01/22/2012 1118   K 4.6 03/01/2012 0530   K 3.5 01/22/2012 1118   CL 95* 03/01/2012 0530   CL 98 01/22/2012 1118   CO2 21 03/01/2012 0530   CO2 28 01/22/2012 1118   GLUCOSE 128* 03/01/2012 0530   GLUCOSE 96 01/22/2012 1118   BUN 54* 03/01/2012 0530   BUN 15 01/22/2012 1118   CREATININE 1.96* 03/01/2012 0530   CREATININE 1.1 01/22/2012 1118   CREATININE 1.08 10/18/2006 1023   CALCIUM 9.4 03/01/2012 0530   CALCIUM 8.3 01/22/2012 1118   PROT 9.3* 03/01/2012 0530   ALBUMIN 2.6* 03/01/2012 0530   AST 19 03/01/2012 0530   ALT 29 03/01/2012 0530   ALKPHOS 43 03/01/2012 0530   BILITOT 4.4* 03/01/2012 0530   GFRNONAA 22* 03/01/2012 0530   GFRAA 26* 03/01/2012  0530    Intake/Output Summary (Last 24 hours) at 03/01/12 1518 Last data filed at 03/01/12 1427  Gross per 24 hour  Intake 2410.33 ml  Output   1050 ml  Net 1360.33 ml     Diet Order: Cardiac  Supplements/Tube Feeding: none  IVF:    Estimated Nutritional Needs:   Kcal: 1875-2100 Protein: 75-90 Fluid: 1.9-2.1 L    Pt with MM actively receiving chemotherapy, admitted with CHF exacerbation, SOB. Per weight hx, pt has not had any recent weight loss thought noted to have BLE edema, moderate to deep pitting per RN flow sheet documentation. RD was unable to speak with pt, she had many visitors in her room at time of RD visit. RD did speak the RN who stated the patient was eating fair, but may benefit from nutrition supplements. PO intake has been mostly less then 50% per documentation. Pt likely has had some weight loss that is masked by fluid accumulation.   NUTRITION DIAGNOSIS: -Inadequate oral intake (NI-2.1).  Status: Ongoing  RELATED TO: increased needs from disease state  AS EVIDENCE BY: likely  weight loss, PO intake less then 50% of most meals   MONITORING/EVALUATION(Goals): Goal: PO intake of meals and supplements to be >75% Monitor: PO intake, weight, labs, I/O's  EDUCATION NEEDS: -No education needs identified at this time  INTERVENTION: 1. RD will add Ensure Complete BID between meals 2. RD will continue to follow   Dietitian [phone]  DOCUMENTATION CODES Per approved criteria  -Not Applicable    Clarene Duke Karen Barron 03/01/2012, 3:15 PM

## 2012-03-01 NOTE — Progress Notes (Signed)
Cosign for Rashida Haney RN assessment, med admin, I&O, and notes 

## 2012-03-01 NOTE — Progress Notes (Signed)
Physical Therapy Treatment Patient Details Name: Karen Barron MRN: [id number] DOB: 11-12-1925 Today's Date: 03/01/2012 Time: 3244-0102 PT Time Calculation (min): 23 min  PT Assessment / Plan / Recommendation Comments on Treatment Session       Follow Up Recommendations  Home health PT;Supervision/Assistance - 24 hour    Barriers to Discharge        Equipment Recommendations  None recommended by PT    Recommendations for Other Services    Frequency Min 2X/week   Plan Discharge plan remains appropriate;Frequency needs to be updated    Precautions / Restrictions Precautions Precautions: Fall Restrictions Weight Bearing Restrictions: No   Pertinent Vitals/Pain No c/o pain.     Mobility  Bed Mobility Bed Mobility: Not assessed Transfers Transfers: Sit to Stand;Stand to Sit Sit to Stand: 5: Supervision Stand to Sit: 5: Supervision Details for Transfer Assistance: Supervision for safety. Pt demonstrating safe technique.  Ambulation/Gait Ambulation/Gait Assistance: 5: Supervision Ambulation Distance (Feet): 50 Feet Assistive device: Rolling walker Ambulation/Gait Assistance Details: Cues for upright posture. Cues to increase bilateral step length.  Gait Pattern: Trunk flexed;Shuffle;Decreased hip/knee flexion - right;Decreased hip/knee flexion - left;Decreased stride length General Gait Details: Slow shuffle gait,  Wheelchair Mobility Wheelchair Mobility: No    Exercises     PT Diagnosis:    PT Problem List:   PT Treatment Interventions:     PT Goals Acute Rehab PT Goals PT Goal Formulation: With patient Time For Goal Achievement: 03/12/12 Potential to Achieve Goals: Good Pt will go Sit to Stand: with modified independence PT Goal: Sit to Stand - Progress: Progressing toward goal Pt will go Stand to Sit: with modified independence PT Goal: Stand to Sit - Progress: Progressing toward goal Pt will Transfer Bed to Chair/Chair to Bed: with modified independence PT  Transfer Goal: Bed to Chair/Chair to Bed - Progress: Progressing toward goal Pt will Stand: with modified independence;3 - 5 min;with no upper extremity support PT Goal: Stand - Progress: Progressing toward goal Pt will Ambulate: 51 - 150 feet;with modified independence;with least restrictive assistive device PT Goal: Ambulate - Progress: Met Pt will Perform Home Exercise Program: with supervision, verbal cues required/provided  Visit Information  Last PT Received On: 03/01/12 Assistance Needed: +1    Subjective Data  Subjective: I dont feel good   Cognition  Overall Cognitive Status: Appears within functional limits for tasks assessed/performed Arousal/Alertness: Awake/alert Orientation Level: Oriented X4 / Intact Behavior During Session: [facility] for tasks performed    Balance  Balance Balance Assessed: No  End of Session PT - End of Session Equipment Utilized During Treatment: Gait belt Activity Tolerance: Patient tolerated treatment well Patient left: in chair;with call bell/phone within reach Nurse Communication: Mobility status    Kaelin Bonelli 03/01/2012, 4:18 PM Merie Wulf L. Aveen Stansel DPT [phone]

## 2012-03-01 NOTE — Progress Notes (Signed)
Subjective: No new issues.  Objective: Vital signs in last 24 hours: Temp:  [97.9 F (36.6 C)-99.3 F (37.4 C)] 98.4 F (36.9 C) (05/24 0541) Pulse Rate:  [86-102] 92  (05/24 1000) Resp:  [18-20] 18  (05/24 0541) BP: (95-108)/(41-69) 96/41 mmHg (05/24 1000) SpO2:  [97 %-100 %] 97 % (05/24 0541) Weight:  [75.2 kg (165 lb 12.6 oz)] 75.2 kg (165 lb 12.6 oz) (05/24 0541) Weight change: 0.629 kg (1 lb 6.2 oz) Last BM Date: 02/28/12  Intake/Output from previous day: 05/23 0701 - 05/24 0700 In: 2090.3 [P.O.:1052; I.V.:740; Blood:298.3] Out: 850 [Urine:850] Total I/O In: 200 [P.O.:200] Out: 300 [Urine:300]   Physical Exam: General: Alert, communicative, fully oriented, not short of breath at rest, not in acute distress.  HEENT:  Moderate clinical pallor, no jaundice, no conjunctival injection or discharge.  NECK:  Supple, JVP not seen, no carotid bruits, no palpable lymphadenopathy, no palpable goiter. CHEST:  Clinically clear to auscultation, no wheezes, no crackles. HEART:  Sounds 1 and 2 heard, normal, regular, mildly tachycardic, no murmurs. ABDOMEN:  Full, soft, non-tender, no palpable organomegaly, no palpable masses, normal bowel sounds. GENITALIA:  Not examined. LOWER EXTREMITIES:  Minimal pitting edema, palpable peripheral pulses. Extensive varicosities. Some petechiae between toes. MUSCULOSKELETAL SYSTEM:  Generalized osteoarthritic changes, otherwise, normal. CENTRAL NERVOUS SYSTEM:  No focal neurologic deficit on gross examination.  Lab Results:  Basename 03/01/12 0530 02/29/12 0610  WBC 3.5* 2.9*  HGB 8.1* 7.3*  HCT 23.7* 22.7*  PLT 15* 14*    Basename 03/01/12 0530 02/29/12 0610  NA 131* 133*  K 4.6 4.3  CL 95* 96  CO2 21 21  GLUCOSE 128* 120*  BUN 54* 49*  CREATININE 1.96* 1.82*  CALCIUM 9.4 9.4   Recent Results (from the past 240 hour(s))  TECHNOLOGIST REVIEW     Status: Normal   Collection Time   02/22/12 10:37 AM      Component Value Range Status  Comment   Technologist Review     Final    Value: Occ Metas and Myelocytes present, few plasma cells, rouleaux  CLOSTRIDIUM DIFFICILE BY PCR     Status: Normal   Collection Time   02/27/12  5:34 PM      Component Value Range Status Comment   C difficile by pcr NEGATIVE  NEGATIVE  Final      Studies/Results: No results found.  Medications: Scheduled Meds:    . amitriptyline  20 mg Oral QHS  . dexamethasone  40 mg Oral Once  . furosemide  20 mg Intravenous Once  . furosemide  40 mg Oral BID  . levothyroxine  75 mcg Oral Daily  . metoprolol succinate  50 mg Oral Daily  . pantoprazole  40 mg Oral Q1200  . Pomalidomide  4 mg Oral Q1500  . potassium chloride SA  20 mEq Oral BID  . simvastatin  20 mg Oral QHS  . sodium chloride  3 mL Intravenous Q12H   Continuous Infusions:  PRN Meds:.sodium chloride, acetaminophen, acetaminophen, albuterol, heparin lock flush, heparin lock flush, HYDROmorphone, ondansetron (ZOFRAN) IV, ondansetron, sodium chloride, traMADol  Assessment/Plan:   1. Acute on chronic diastolic heart failure (02/26/2012): Patient presented with SOBOE and orthopnea, progressive over about 2 months, associated with bilateral lower extremity edema. BNP was 5099, cardiac enzymes were un-elevated, effectively, ruling out ACS. D-Dimer is negative. 2D echocardiogram of 02/27/12, showed normal left ventricular cavity size, ejection fraction of 60% to 65% and no regional wall motion abnormalities. Thre was  mild mitral  Regurgitation, mildly dilated left atrium and moderately Increased pulmonary artery pressure. PA peak pressure: 57mm Hg (S). Dr Olga Millers provided cardiology consultation, and has opined that some of dyspnea most likely driven by anemia. Patient is currently on Lasix, switched from iv to PO on 02/27/12. Patient's creatinine is still trending up. Will change Lasix to 40 mg daily. 2. Epigastric discomfort (02/26/2012): This was likely secondary to GERD, and has resolved  with GI cocktail, followed by PPI.  3. Loose Bowel movement: Patient had loose bowel movements at presentation, and against a background of recent use of antibiotics for pneumonia with avelox and keflex and steroids, the possibility of C. Diff colitis was entertained. Fortunately, C. Diff PCR is negative. Patient has been reassured accordingly. Now on prn Imodium, and diarrhea has resolved.  4. Biateral leg Pain: Likely secondary to significant edema. Venous duplex negative for DVT. No longer problematic, as of 02/29/12.  5. Renal insufficiency: This is acute on chronic. Patient's baseline creatinine in March 2013 is 1.1. Creatinine on admission was 1.7. kidney ultrasound shows no evidence of hydronephrosis. Nephrotoxins are being avoided, but creatinine has crept up gradually, since hospitalization, likely due to diuretic therapy. We are following renal indices, and these are gradually creeping up. Creatinine today, is 1.96. As described above, Lasix dose has been reduced. 6. Multiple Myeloma: This is refractory IgM multiple myeloma, now being treated with salvage Pomalidomide/Decadron. The pomalidomide was started on 02/24/2012. Patient has a pancytopenia secondary to multiple myeloma and has had weekly blood transfusions for anemia, certainly, in the past one month. Thrombocytopenia is stable, and predated initiation of Pomalidomide. Dr Myrle Sheng provided hematology/oncology consultation. Anemia continues to be a persistent problem, and patient was transfused one unit PRBC on 02/28/12, for a hemoglobin of 6.7, and a further unit on 02/19/12. Hemoglobin is 8.1 today. Per Dr Kalman Drape recommendation, we shall transfuse again, today.   Disposition: Per Hematology.  LOS: 4 days   Bijou Easler,CHRISTOPHER 03/01/2012, 1:30 PM

## 2012-03-01 NOTE — Progress Notes (Signed)
Patient ID: Karen Barron, female   DOB: 1926/03/26, 78 y.o.   MRN: [id number] Chart reviewed. I/O + with blood. Continue current dose of Lasix and watch volume status. Call if questions.

## 2012-03-01 NOTE — Progress Notes (Signed)
Notified MD of Pt's BP- 96/41. Orders to hold Metoprolol and any other ant- hypertensives. Will continue to monitor.

## 2012-03-01 NOTE — Plan of Care (Signed)
Problem: Phase I Progression Outcomes Goal: EF % per last Echo/documented,Core Reminder form on chart Outcome: Completed/Met Date Met:  03/01/12 60-65% (02/2012)

## 2012-03-02 DIAGNOSIS — I5031 Acute diastolic (congestive) heart failure: Secondary | ICD-10-CM

## 2012-03-02 DIAGNOSIS — C9000 Multiple myeloma not having achieved remission: Secondary | ICD-10-CM

## 2012-03-02 DIAGNOSIS — R1013 Epigastric pain: Secondary | ICD-10-CM

## 2012-03-02 DIAGNOSIS — N179 Acute kidney failure, unspecified: Secondary | ICD-10-CM

## 2012-03-02 LAB — TYPE AND SCREEN
ABO/RH(D): A POS
Antibody Screen: NEGATIVE
Unit division: 0

## 2012-03-02 LAB — BASIC METABOLIC PANEL
CO2: 22 mEq/L (ref 19–32)
Calcium: 9.2 mg/dL (ref 8.4–10.5)
Chloride: 98 mEq/L (ref 96–112)
Sodium: 133 mEq/L — ABNORMAL LOW (ref 135–145)

## 2012-03-02 LAB — CBC
MCH: 30.6 pg (ref 26.0–34.0)
Platelets: 14 10*3/uL — CL (ref 150–400)
RBC: 2.58 MIL/uL — ABNORMAL LOW (ref 3.87–5.11)
WBC: 2.7 10*3/uL — ABNORMAL LOW (ref 4.0–10.5)

## 2012-03-02 MED ORDER — FUROSEMIDE 10 MG/ML IJ SOLN
40.0000 mg | Freq: Two times a day (BID) | INTRAMUSCULAR | Status: DC
  Administered 2012-03-02 – 2012-03-03 (×4): 40 mg via INTRAVENOUS
  Filled 2012-03-02 (×7): qty 4

## 2012-03-02 MED ORDER — CLOTRIMAZOLE 1 % EX CREA
TOPICAL_CREAM | Freq: Two times a day (BID) | CUTANEOUS | Status: DC
  Administered 2012-03-02: 1 via TOPICAL
  Administered 2012-03-02 – 2012-03-10 (×16): via TOPICAL
  Administered 2012-03-10: 1 via TOPICAL
  Administered 2012-03-11: 09:00:00 via TOPICAL
  Filled 2012-03-02 (×3): qty 15

## 2012-03-02 NOTE — Progress Notes (Signed)
Subjective:  Patient a little more SOB last night.   Objective: Filed Vitals:   03/01/12 1915 03/01/12 2017 03/01/12 2052 03/02/12 0444  BP: 107/52 113/49 109/48 115/41  Pulse: 100 98 101 104  Temp: 98.5 F (36.9 C) 99 F (37.2 C) 98.8 F (37.1 C) 99 F (37.2 C)  TempSrc:  Oral Oral Oral  Resp: 20 18 18 20   Height:      Weight:    168 lb 4.8 oz (76.34 kg)  SpO2:    100%   Weight change: 2 lb 8.2 oz (1.141 kg)  Intake/Output Summary (Last 24 hours) at 03/02/12 0738 Last data filed at 03/01/12 2259  Gross per 24 hour  Intake   1517 ml  Output    900 ml  Net    617 ml    General: Alert, awake, oriented x3, in no acute distress Neck:  JVP is increased to below jaw. Heart: Regular rate and rhythm, without murmurs, rubs, gallops.  Lungs: Clear to auscultation.  No rales or wheezes. Exemities:  1+ edema.   Neuro: Grossly intact, nonfocal.  Tele:  SR. Lab Results: Results for orders placed during the hospital encounter of 02/26/12 (from the past 24 hour(s))  PREPARE RBC (CROSSMATCH)     Status: Normal   Collection Time   03/01/12  1:40 PM      Component Value Range   Order Confirmation ORDER PROCESSED BY BLOOD BANK    CBC     Status: Abnormal   Collection Time   03/02/12  5:53 AM      Component Value Range   WBC 2.7 (*) 4.0 - 10.5 (K/uL)   RBC 2.58 (*) 3.87 - 5.11 (MIL/uL)   Hemoglobin 7.9 (*) 12.0 - 15.0 (g/dL)   HCT 11.9 (*) 14.7 - 46.0 (%)   MCV 93.0  78.0 - 100.0 (fL)   MCH 30.6  26.0 - 34.0 (pg)   MCHC 32.9  30.0 - 36.0 (g/dL)   RDW 82.9 (*) 56.2 - 15.5 (%)   Platelets 14 (*) 150 - 400 (K/uL)  BASIC METABOLIC PANEL     Status: Abnormal   Collection Time   03/02/12  5:53 AM      Component Value Range   Sodium 133 (*) 135 - 145 (mEq/L)   Potassium 4.2  3.5 - 5.1 (mEq/L)   Chloride 98  96 - 112 (mEq/L)   CO2 22  19 - 32 (mEq/L)   Glucose, Bld 114 (*) 70 - 99 (mg/dL)   BUN 51 (*) 6 - 23 (mg/dL)   Creatinine, Ser 1.30 (*) 0.50 - 1.10 (mg/dL)   Calcium 9.2  8.4  - 10.5 (mg/dL)   GFR calc non Af Amer 28 (*) >90 (mL/min)   GFR calc Af Amer 33 (*) >90 (mL/min)    Studies/Results: No results found.  Medications: I have reviewed the patient's current medications.   Patient Active Hospital Problem List: Acute on chronic diastolic heart failure (02/26/2012)   Assessment: Volume is still up, wt up from yesterday.   I >O.  I would recomm treating with IV lasix.  Follow renal function.  May even benefit from renal dose dopamine if Cr bumps.  Multiple myeloma  Received Tx yesterday. Dietrich Pates 03/02/2012, 7:38 AM

## 2012-03-02 NOTE — Progress Notes (Signed)
CRITICAL VALUE ALERT  Critical value received: hgb=7.9  Date of notification:  03/02/2012  Time of notification:  0700  Critical value read back: yes  Nurse who received alert:  G. Mckaylin Bastien  MD notified (1st page):  Stevphen Meuse  Time of first page: 0705  MD notified (2nd page):  Time of second page:  Responding MD:  Stevphen Meuse  Time MD responded:  [phone]

## 2012-03-02 NOTE — Progress Notes (Signed)
Occupational Therapy Treatment Patient Details Name: Karen Barron MRN: [id number] DOB: 1926/05/24 Today's Date: 03/02/2012 Time: 4098-1191 OT Time Calculation (min): 24 min  OT Assessment / Plan / Recommendation Comments on Treatment Session      Follow Up Recommendations  Home health OT    Barriers to Discharge       Equipment Recommendations  None recommended by PT/OT    Recommendations for Other Services    Frequency Min 2X/week   Plan Discharge plan remains appropriate    Precautions / Restrictions Precautions Precautions: Fall Restrictions Weight Bearing Restrictions: No   Pertinent Vitals/Pain     ADL  Grooming: Performed;Wash/dry hands;Supervision/safety Where Assessed - Grooming: Supported Copywriter, advertising: Performed;Minimal Web designer: Regular height toilet;Grab bars Toileting - Architect and Hygiene: Performed;Min guard Where Assessed - Engineer, mining and Hygiene: Standing Transfers/Ambulation Related to ADLs: min A for sit to stand and for ambulating to bathroom.  Min cues to keep weight forward with sit to stand ADL Comments: Fatiques easily    OT Diagnosis:    OT Problem List:   OT Treatment Interventions:     OT Goals ADL Goals Pt Will Perform Grooming: with supervision;Standing at sink ADL Goal: Grooming - Progress: Progressing toward goals Additional ADL Goal #1: Pt will complete all aspects of toileting on comfort hight commode with rails and S. ADL Goal: Additional Goal #1 - Progress: Progressing toward goals Arm Goals Additional Arm Goal #1: Pt will be proficient in SROM of L shoulder to increase ROM in shoulder to assist with UE adls. Arm Goal: Additional Goal #1 - Progress: Progressing toward goals  Visit Information  Last OT Received On: 03/02/12 Assistance Needed: +1    Subjective Data  Subjective: Ill do whatever you think is best   Prior Functioning        Cognition   cognition and behavior wfls    Mobility Transfers Sit to Stand: 4: Min assist;With armrests;From chair/3-in-1   Exercises    Balance    End of Session OT - End of Session Equipment Utilized During Treatment: Gait belt (loosely due to decreased platelets) Activity Tolerance: Patient limited by fatigue Patient left: in chair;with call bell/phone within reach;with family/visitor present   [facility] 03/02/2012, 9:31 AM Marica Otter, OTR/L [phone] 03/02/2012

## 2012-03-02 NOTE — Progress Notes (Signed)
Subjective: No new complaints.  Objective: Vital signs in last 24 hours: Temp:  [97.9 F (36.6 C)-99.1 F (37.3 C)] 99 F (37.2 C) (05/25 0444) Pulse Rate:  [92-104] 104  (05/25 0444) Resp:  [18-20] 20  (05/25 0444) BP: (96-115)/(32-52) 115/41 mmHg (05/25 0444) SpO2:  [100 %] 100 % (05/25 0444) FiO2 (%):  [2 %] 2 % (05/24 1508) Weight:  [76.34 kg (168 lb 4.8 oz)] 76.34 kg (168 lb 4.8 oz) (05/25 0444) Weight change: 1.141 kg (2 lb 8.2 oz) Last BM Date: 03/02/12  Intake/Output from previous day: 05/24 0701 - 05/25 0700 In: 1517 [P.O.:680; I.V.:490; Blood:347] Out: 900 [Urine:900] Total I/O In: 120 [P.O.:120] Out: -    Physical Exam: General: Alert, communicative, fully oriented, not short of breath at rest, not in acute distress.  HEENT:  Moderate clinical pallor, no jaundice, no conjunctival injection or discharge.  NECK:  Supple, JVP not seen, no carotid bruits, no palpable lymphadenopathy, no palpable goiter. CHEST:  Clinically clear to auscultation, no wheezes, no crackles. HEART:  Sounds 1 and 2 heard, normal, regular, mildly tachycardic, no murmurs. ABDOMEN:  Full, soft, non-tender, no palpable organomegaly, no palpable masses, normal bowel sounds. GENITALIA:  Not examined. LOWER EXTREMITIES:  Mild-moderate pitting edema, palpable peripheral pulses. Extensive varicosities. Some petechiae between toes, has tinea interdigitalis. MUSCULOSKELETAL SYSTEM:  Generalized osteoarthritic changes, otherwise, normal. CENTRAL NERVOUS SYSTEM:  No focal neurologic deficit on gross examination.  Lab Results:  Basename 03/02/12 0553 03/01/12 0530  WBC 2.7* 3.5*  HGB 7.9* 8.1*  HCT 24.0* 23.7*  PLT 14* 15*    Basename 03/02/12 0553 03/01/12 0530  NA 133* 131*  K 4.2 4.6  CL 98 95*  CO2 22 21  GLUCOSE 114* 128*  BUN 51* 54*  CREATININE 1.62* 1.96*  CALCIUM 9.2 9.4   Recent Results (from the past 240 hour(s))  TECHNOLOGIST REVIEW     Status: Normal   Collection Time   02/22/12 10:37 AM      Component Value Range Status Comment   Technologist Review     Final    Value: Occ Metas and Myelocytes present, few plasma cells, rouleaux  CLOSTRIDIUM DIFFICILE BY PCR     Status: Normal   Collection Time   02/27/12  5:34 PM      Component Value Range Status Comment   C difficile by pcr NEGATIVE  NEGATIVE  Final      Studies/Results: No results found.  Medications: Scheduled Meds:    . amitriptyline  20 mg Oral QHS  . dexamethasone  40 mg Oral Once  . docusate sodium  100 mg Oral BID  . feeding supplement  237 mL Oral BID BM  . furosemide  40 mg Intravenous BID  . levothyroxine  75 mcg Oral Daily  . metoprolol succinate  50 mg Oral Daily  . pantoprazole  40 mg Oral Q1200  . Pomalidomide  4 mg Oral Q1500  . potassium chloride SA  20 mEq Oral BID  . simvastatin  20 mg Oral QHS  . sodium chloride  3 mL Intravenous Q12H  . DISCONTD: furosemide  40 mg Oral BID  . DISCONTD: furosemide  40 mg Oral Daily   Continuous Infusions:  PRN Meds:.sodium chloride, acetaminophen, acetaminophen, albuterol, heparin lock flush, heparin lock flush, HYDROmorphone, ondansetron (ZOFRAN) IV, ondansetron, sodium chloride, sodium chloride, traMADol  Assessment/Plan:   1. Acute on chronic diastolic heart failure (02/26/2012): Patient presented with SOBOE and orthopnea, progressive over about 2 months, associated with bilateral  lower extremity edema. BNP was 5099, cardiac enzymes were un-elevated, effectively, ruling out ACS. D-Dimer is negative. 2D echocardiogram of 02/27/12, showed normal left ventricular cavity size, ejection fraction of 60% to 65% and no regional wall motion abnormalities. Thre was mild mitral  Regurgitation, mildly dilated left atrium and moderately Increased pulmonary artery pressure. PA peak pressure: 57mm Hg (S). Dr Olga Millers provided cardiology consultation, and has opined that some of dyspnea most likely driven by anemia. Patient appears to be still  volume-overloaded today, and creatinine has improved from 03/01/12. Dr Tenny Craw has changed Lasix to 40 mg iv b.i.d, today. 2. Epigastric discomfort (02/26/2012): This was likely secondary to GERD, and has resolved with GI cocktail, followed by PPI.  3. Loose Bowel movement: Patient had loose bowel movements at presentation, and against a background of recent use of antibiotics for pneumonia with avelox and keflex and steroids, the possibility of C. Diff colitis was entertained. Fortunately, C. Diff PCR is negative. Patient has been reassured accordingly. Now on prn Imodium, and diarrhea has resolved.  4. Biateral leg Pain: Likely secondary to significant edema. Venous duplex negative for DVT. No longer problematic, as of 02/29/12.  5. Renal insufficiency: This is acute on chronic. Patient's baseline creatinine in March 2013 is 1.1. Creatinine on admission was 1.7. kidney ultrasound shows no evidence of hydronephrosis. Nephrotoxins are being avoided, but creatinine has crept up gradually, since hospitalization, likely due to diuretic therapy. We are following renal indices, and these are gradually creeping up. Creatinine on 03/01/12, was 1.96. Today, creatinine is 1.62. 6. Multiple Myeloma: This is refractory IgM multiple myeloma, now being treated with salvage Pomalidomide/Decadron. The pomalidomide was started on 02/24/2012. Patient has a pancytopenia secondary to multiple myeloma and has had weekly blood transfusions for anemia, certainly, in the past one month. Thrombocytopenia is stable, and predated initiation of Pomalidomide. Dr Myrle Sheng provided hematology/oncology consultation. Anemia continues to be a persistent problem, and patient has been  transfused 3 units PRBC from  02/28/12-03/02/12, for a hemoglobin of 6.7. Today, hemoglobin is 7.9. 7. Tinea interdigitalis: This was an incidental finding, affecting both feet. Per daughter, patient does tend to wears socks all the time. Will address with topical Lotrimin  AF. Have encouraged expose feet more often.  Disposition: Per Hematology/Cardiology.  LOS: 5 days   Savannaha Stonerock,CHRISTOPHER 03/02/2012, 9:11 AM

## 2012-03-03 DIAGNOSIS — I5031 Acute diastolic (congestive) heart failure: Secondary | ICD-10-CM

## 2012-03-03 DIAGNOSIS — R1013 Epigastric pain: Secondary | ICD-10-CM

## 2012-03-03 DIAGNOSIS — C9000 Multiple myeloma not having achieved remission: Secondary | ICD-10-CM

## 2012-03-03 DIAGNOSIS — N179 Acute kidney failure, unspecified: Secondary | ICD-10-CM

## 2012-03-03 LAB — DIFFERENTIAL
Basophils Absolute: 0 10*3/uL (ref 0.0–0.1)
Lymphocytes Relative: 25 % (ref 12–46)
Monocytes Relative: 16 % — ABNORMAL HIGH (ref 3–12)
Neutro Abs: 2 10*3/uL (ref 1.7–7.7)

## 2012-03-03 LAB — BASIC METABOLIC PANEL
BUN: 54 mg/dL — ABNORMAL HIGH (ref 6–23)
BUN: 55 mg/dL — ABNORMAL HIGH (ref 6–23)
CO2: 23 mEq/L (ref 19–32)
CO2: 22 mEq/L (ref 19–32)
Chloride: 98 mEq/L (ref 96–112)
Glucose, Bld: 205 mg/dL — ABNORMAL HIGH (ref 70–99)
Glucose, Bld: 268 mg/dL — ABNORMAL HIGH (ref 70–99)
Potassium: 4.2 mEq/L (ref 3.5–5.1)
Potassium: 4 mEq/L (ref 3.5–5.1)
Sodium: 136 mEq/L (ref 135–145)

## 2012-03-03 LAB — CBC
HCT: 23 % — ABNORMAL LOW (ref 36.0–46.0)
RDW: 19.6 % — ABNORMAL HIGH (ref 11.5–15.5)
WBC: 3.4 10*3/uL — ABNORMAL LOW (ref 4.0–10.5)

## 2012-03-03 LAB — PRO B NATRIURETIC PEPTIDE: Pro B Natriuretic peptide (BNP): 6505 pg/mL — ABNORMAL HIGH (ref 0–450)

## 2012-03-03 NOTE — Progress Notes (Signed)
Subjective: Has small non-painful ulcer, just inside left corner of upper lip.  Objective: Vital signs in last 24 hours: Temp:  [97.4 F (36.3 C)-98.2 F (36.8 C)] 97.4 F (36.3 C) (05/26 0626) Pulse Rate:  [78-85] 81  (05/26 0626) Resp:  [20] 20  (05/26 0626) BP: (103-112)/(42-65) 112/42 mmHg (05/26 0626) SpO2:  [97 %-99 %] 97 % (05/26 0626) Weight:  [75.1 kg (165 lb 9.1 oz)] 75.1 kg (165 lb 9.1 oz) (05/26 0626) Weight change: -1.24 kg (-2 lb 11.8 oz) Last BM Date: 02/28/12  Intake/Output from previous day: 05/25 0701 - 05/26 0700 In: 1130 [P.O.:790; I.V.:340] Out: 2250 [Urine:2250] Total I/O In: 120 [P.O.:120] Out: -    Physical Exam: General: Alert, communicative, fully oriented, not short of breath at rest, not in acute distress.  HEENT:  Moderate clinical pallor, no jaundice, no conjunctival injection or discharge. Small aphthous ulcer, inside corner of left upper lip. NECK:  Supple, JVP not seen, no carotid bruits, no palpable lymphadenopathy, no palpable goiter. CHEST:  Clinically clear to auscultation, no wheezes, no crackles. HEART:  Sounds 1 and 2 heard, normal, regular, no murmurs. ABDOMEN:  Full, soft, non-tender, no palpable organomegaly, no palpable masses, normal bowel sounds. GENITALIA:  Not examined. LOWER EXTREMITIES:  Mild pitting edema, palpable peripheral pulses. Extensive varicosities. Some petechiae between toes, has tinea interdigitalis. MUSCULOSKELETAL SYSTEM:  Generalized osteoarthritic changes, otherwise, normal. CENTRAL NERVOUS SYSTEM:  No focal neurologic deficit on gross examination.  Lab Results:  Basename 03/03/12 0500 03/02/12 0553  WBC 3.4* 2.7*  HGB 7.7* 7.9*  HCT 23.0* 24.0*  PLT 14* 14*    Basename 03/03/12 0500 03/02/12 0553  NA 136 133*  K 4.2 4.2  CL 101 98  CO2 23 22  GLUCOSE 205* 114*  BUN 54* 51*  CREATININE 1.38* 1.62*  CALCIUM 8.8 9.2   Recent Results (from the past 240 hour(s))  CLOSTRIDIUM DIFFICILE BY PCR      Status: Normal   Collection Time   02/27/12  5:34 PM      Component Value Range Status Comment   C difficile by pcr NEGATIVE  NEGATIVE  Final      Studies/Results: No results found.  Medications: Scheduled Meds:    . amitriptyline  20 mg Oral QHS  . clotrimazole   Topical BID  . docusate sodium  100 mg Oral BID  . feeding supplement  237 mL Oral BID BM  . furosemide  40 mg Intravenous BID  . levothyroxine  75 mcg Oral Daily  . metoprolol succinate  50 mg Oral Daily  . pantoprazole  40 mg Oral Q1200  . Pomalidomide  4 mg Oral Q1500  . potassium chloride SA  20 mEq Oral BID  . simvastatin  20 mg Oral QHS  . sodium chloride  3 mL Intravenous Q12H   Continuous Infusions:  PRN Meds:.sodium chloride, acetaminophen, acetaminophen, albuterol, heparin lock flush, heparin lock flush, HYDROmorphone, ondansetron (ZOFRAN) IV, ondansetron, sodium chloride, sodium chloride, traMADol  Assessment/Plan:   1. Acute on chronic diastolic heart failure (02/26/2012): Patient presented with SOBOE and orthopnea, progressive over about 2 months, associated with bilateral lower extremity edema. BNP was 5099, cardiac enzymes were un-elevated, effectively, ruling out ACS. D-Dimer is negative. 2D echocardiogram of 02/27/12, showed normal left ventricular cavity size, ejection fraction of 60% to 65% and no regional wall motion abnormalities. Thre was mild mitral  Regurgitation, mildly dilated left atrium and moderately Increased pulmonary artery pressure. PA peak pressure: 57mm Hg (S). Dr Arlys John  Crenshaw provided cardiology consultation, and has opined that some of dyspnea most likely driven by anemia. Dr Tenny Craw has changed Lasix to 40 mg iv b.i.d on 03/02/12, and patient appears to be tolerating this well. 2. Epigastric discomfort (02/26/2012): This was likely secondary to GERD, and has resolved with GI cocktail, followed by PPI.  3. Loose Bowel movement: Patient had loose bowel movements at presentation, and against  a background of recent use of antibiotics for pneumonia with avelox and keflex and steroids, the possibility of C. Diff colitis was entertained. Fortunately, C. Diff PCR is negative. Patient has been reassured accordingly. Now on prn Imodium, and diarrhea has resolved.  4. Biateral leg Pain: Likely secondary to significant edema. Venous duplex negative for DVT. No longer problematic, as of 02/29/12.  5. Renal insufficiency: This is acute on chronic. Patient's baseline creatinine in March 2013 was 1.1. Creatinine on admission was 1.7. kidney ultrasound showed no evidence of hydronephrosis. Nephrotoxins are being avoided, but creatinine crept up gradually, since hospitalization, likely due to diuretic therapy. On 03/01/12, creatinine reach a high of was 1.96, but has started trending down, and is 1.38 on 03/03/12. 6. Multiple Myeloma: This is refractory IgM multiple myeloma, now being treated with salvage Pomalidomide/Decadron. The Pomalidomide was started on 02/24/2012. Patient has a pancytopenia secondary to multiple myeloma and has had weekly blood transfusions for anemia, certainly, in the past one month. Thrombocytopenia is stable, and predated initiation of Pomalidomide. Dr Myrle Sheng provided hematology/oncology consultation. Anemia continues to be a persistent problem, and patient has been  transfused 3 units PRBC from  02/28/12-03/02/12, for a hemoglobin of 6.7. Today, hemoglobin is 7.7. 7. Tinea interdigitalis: This was an incidental finding, affecting both feet. Per daughter, patient does tend to wears socks all the time. Addressing with topical Lotrimin AF. Have encouraged to expose feet more often. 8. Aphthous Ulcer: Patient has a single lesion in the inner aspect of the left corner of her upper lip. Per patient, she has no appreciable discomfort from this. No specific treatment appears indicated, at this time. 9. Deconditioning: Patient appears deconditioned, due to her significant medical issues. Effort  tolerance is poor. PT/OT is in progress.  Disposition: Per Hematology/Cardiology.  LOS: 6 days   Tino Ronan,CHRISTOPHER 03/03/2012, 10:41 AM

## 2012-03-03 NOTE — Progress Notes (Signed)
Subjective:  Patient complains of HA  Not SOB  No CP. Objective: Filed Vitals:   03/02/12 0444 03/02/12 1404 03/02/12 2202 03/03/12 0626  BP: 115/41 103/65 112/44 112/42  Pulse: 104 85 78 81  Temp: 99 F (37.2 C) 98.2 F (36.8 C) 97.6 F (36.4 C) 97.4 F (36.3 C)  TempSrc: Oral Oral Oral Oral  Resp: 20 20 20 20   Height:      Weight: 168 lb 4.8 oz (76.34 kg)   165 lb 9.1 oz (75.1 kg)  SpO2: 100% 99% 97% 97%   Weight change: -2 lb 11.8 oz (-1.24 kg)  Intake/Output Summary (Last 24 hours) at 03/03/12 0837 Last data filed at 03/03/12 0500  Gross per 24 hour  Intake   1010 ml  Output   2250 ml  Net  -1240 ml    General: Alert, awake, oriented x3, in no acute distress Neck:  JVP is increased.   Heart: Regular rate and rhythm, without murmurs, rubs, gallops.  Lungs: Clear to auscultation.  No rales or wheezes. Exemities:1 to 2+ LE edema Neuro: Grossly intact, nonfocal.  Tele:  SR. Lab Results: Results for orders placed during the hospital encounter of 02/26/12 (from the past 24 hour(s))  PRO B NATRIURETIC PEPTIDE     Status: Abnormal   Collection Time   03/03/12  5:00 AM      Component Value Range   Pro B Natriuretic peptide (BNP) 6505.0 (*) 0 - 450 (pg/mL)  CBC     Status: Abnormal   Collection Time   03/03/12  5:00 AM      Component Value Range   WBC 3.4 (*) 4.0 - 10.5 (K/uL)   RBC 2.45 (*) 3.87 - 5.11 (MIL/uL)   Hemoglobin 7.7 (*) 12.0 - 15.0 (g/dL)   HCT 16.1 (*) 09.6 - 46.0 (%)   MCV 93.9  78.0 - 100.0 (fL)   MCH 31.4  26.0 - 34.0 (pg)   MCHC 33.5  30.0 - 36.0 (g/dL)   RDW 04.5 (*) 40.9 - 15.5 (%)   Platelets 14 (*) 150 - 400 (K/uL)  DIFFERENTIAL     Status: Abnormal   Collection Time   03/03/12  5:00 AM      Component Value Range   Neutrophils Relative 57  43 - 77 (%)   Lymphocytes Relative 25  12 - 46 (%)   Monocytes Relative 16 (*) 3 - 12 (%)   Eosinophils Relative 1  0 - 5 (%)   Basophils Relative 1  0 - 1 (%)   Neutro Abs 2.0  1.7 - 7.7 (K/uL)   Lymphs Abs 0.9  0.7 - 4.0 (K/uL)   Monocytes Absolute 0.5  0.1 - 1.0 (K/uL)   Eosinophils Absolute 0.0  0.0 - 0.7 (K/uL)   Basophils Absolute 0.0  0.0 - 0.1 (K/uL)   RBC Morphology ROULEAUX     WBC Morphology ATYPICAL LYMPHOCYTES    BASIC METABOLIC PANEL     Status: Abnormal   Collection Time   03/03/12  5:00 AM      Component Value Range   Sodium 136  135 - 145 (mEq/L)   Potassium 4.2  3.5 - 5.1 (mEq/L)   Chloride 101  96 - 112 (mEq/L)   CO2 23  19 - 32 (mEq/L)   Glucose, Bld 205 (*) 70 - 99 (mg/dL)   BUN 54 (*) 6 - 23 (mg/dL)   Creatinine, Ser 8.11 (*) 0.50 - 1.10 (mg/dL)   Calcium 8.8  8.4 -  10.5 (mg/dL)   GFR calc non Af Amer 34 (*) >90 (mL/min)   GFR calc Af Amer 40 (*) >90 (mL/min)    Studies/Results: No results found.  Medications: I have reviewed the patient's current medications.   Patient Active Hospital Problem List: Acute on chronic diastolic heart failure (02/26/2012)   Assessment: Patient has diuresed some .  Cr actually a little better..  I would continue esp if plan for further transfusion.  FOllow labs.   LOS: 6 days   Dietrich Pates 03/03/2012, 8:37 AM

## 2012-03-04 DIAGNOSIS — R1013 Epigastric pain: Secondary | ICD-10-CM

## 2012-03-04 DIAGNOSIS — I5031 Acute diastolic (congestive) heart failure: Secondary | ICD-10-CM

## 2012-03-04 DIAGNOSIS — N179 Acute kidney failure, unspecified: Secondary | ICD-10-CM

## 2012-03-04 DIAGNOSIS — C9000 Multiple myeloma not having achieved remission: Secondary | ICD-10-CM

## 2012-03-04 LAB — CBC
HCT: 21.9 % — ABNORMAL LOW (ref 36.0–46.0)
Hemoglobin: 7.2 g/dL — ABNORMAL LOW (ref 12.0–15.0)
MCH: 31.2 pg (ref 26.0–34.0)
MCHC: 32.9 g/dL (ref 30.0–36.0)
MCV: 94.8 fL (ref 78.0–100.0)

## 2012-03-04 LAB — HEPATIC FUNCTION PANEL
AST: 17 U/L (ref 0–37)
Albumin: 2.6 g/dL — ABNORMAL LOW (ref 3.5–5.2)
Alkaline Phosphatase: 41 U/L (ref 39–117)
Total Protein: 9 g/dL — ABNORMAL HIGH (ref 6.0–8.3)

## 2012-03-04 LAB — BASIC METABOLIC PANEL
BUN: 56 mg/dL — ABNORMAL HIGH (ref 6–23)
CO2: 24 mEq/L (ref 19–32)
Calcium: 8.8 mg/dL (ref 8.4–10.5)
Glucose, Bld: 112 mg/dL — ABNORMAL HIGH (ref 70–99)
Sodium: 134 mEq/L — ABNORMAL LOW (ref 135–145)

## 2012-03-04 LAB — RETICULOCYTES: RBC.: 2.43 MIL/uL — ABNORMAL LOW (ref 3.87–5.11)

## 2012-03-04 LAB — PREPARE RBC (CROSSMATCH)

## 2012-03-04 LAB — DIRECT ANTIGLOBULIN TEST (NOT AT ARMC)
DAT, IgG: NEGATIVE
DAT, complement: NEGATIVE

## 2012-03-04 MED ORDER — POLYETHYLENE GLYCOL 3350 17 G PO PACK
17.0000 g | PACK | Freq: Every day | ORAL | Status: DC | PRN
  Administered 2012-03-04 – 2012-03-09 (×2): 17 g via ORAL
  Filled 2012-03-04 (×2): qty 1

## 2012-03-04 MED ORDER — BISACODYL 10 MG RE SUPP: 10.0000 mg | Freq: Once | RECTAL | Status: DC

## 2012-03-04 MED ORDER — FUROSEMIDE 10 MG/ML IJ SOLN
20.0000 mg | Freq: Once | INTRAMUSCULAR | Status: AC
  Administered 2012-03-04: 20 mg via INTRAVENOUS
  Filled 2012-03-04: qty 2

## 2012-03-04 MED ORDER — FUROSEMIDE 40 MG PO TABS
40.0000 mg | ORAL_TABLET | Freq: Two times a day (BID) | ORAL | Status: DC
  Administered 2012-03-04 – 2012-03-11 (×15): 40 mg via ORAL
  Filled 2012-03-04 (×20): qty 1

## 2012-03-04 NOTE — Progress Notes (Signed)
03/04/12 1615 received via [facility], PRBC infusing and finished upon admission at 1615, converted to NS at [facility] per port right chest. Oriented to room and unit, O2 at 2liters, other VSS, afebrile. Dr. Darnelle Catalan notified of admission, Dr. Brien Few to notify Dr. Darnelle Catalan of admit.

## 2012-03-04 NOTE — Progress Notes (Signed)
Subjective: No new issues. Just feels weak.  Objective: Vital signs in last 24 hours: Temp:  [97.5 F (36.4 C)-98 F (36.7 C)] 98 F (36.7 C) (05/27 0507) Pulse Rate:  [87-93] 87  (05/27 0507) Resp:  [20-22] 20  (05/27 0507) BP: (106-115)/(45-73) 115/73 mmHg (05/27 0507) SpO2:  [99 %-100 %] 99 % (05/27 0507) Weight:  [76.4 kg (168 lb 6.9 oz)] 76.4 kg (168 lb 6.9 oz) (05/27 0507) Weight change: 1.3 kg (2 lb 13.9 oz) Last BM Date: 02/29/12  Intake/Output from previous day: 05/26 0701 - 05/27 0700 In: 480 [P.O.:240; I.V.:240] Out: 650 [Urine:650] Total I/O In: 300 [P.O.:300] Out: 250 [Urine:250]   Physical Exam: General: Alert, communicative, fully oriented, not short of breath at rest, not in acute distress.  HEENT:  Moderate clinical pallor, no jaundice, no conjunctival injection or discharge. Small aphthous ulcer, inside corner of left upper lip. NECK:  Supple, JVP not seen, no carotid bruits, no palpable lymphadenopathy, no palpable goiter. CHEST:  Clinically clear to auscultation, no wheezes, no crackles. HEART:  Sounds 1 and 2 heard, normal, regular, no murmurs. ABDOMEN:  Full, soft, non-tender, no palpable organomegaly, no palpable masses, normal bowel sounds. GENITALIA:  Not examined. LOWER EXTREMITIES:  Mild pitting edema, palpable peripheral pulses. Extensive varicosities. Some petechiae between toes, has tinea interdigitalis. MUSCULOSKELETAL SYSTEM:  Generalized osteoarthritic changes, otherwise, normal. CENTRAL NERVOUS SYSTEM:  No focal neurologic deficit on gross examination.  Lab Results:  Basename 03/04/12 0452 03/03/12 0500  WBC 3.2* 3.4*  HGB 7.2* 7.7*  HCT 21.9* 23.0*  PLT 15* 14*    Basename 03/04/12 0452 03/03/12 1100  NA 134* 133*  K 4.6 4.0  CL 100 98  CO2 24 22  GLUCOSE 112* 268*  BUN 56* 55*  CREATININE 1.55* 1.38*  CALCIUM 8.8 8.8   Recent Results (from the past 240 hour(s))  CLOSTRIDIUM DIFFICILE BY PCR     Status: Normal   Collection  Time   02/27/12  5:34 PM      Component Value Range Status Comment   C difficile by pcr NEGATIVE  NEGATIVE  Final      Studies/Results: No results found.  Medications: Scheduled Meds:    . amitriptyline  20 mg Oral QHS  . clotrimazole   Topical BID  . docusate sodium  100 mg Oral BID  . feeding supplement  237 mL Oral BID BM  . furosemide  40 mg Oral BID  . levothyroxine  75 mcg Oral Daily  . metoprolol succinate  50 mg Oral Daily  . pantoprazole  40 mg Oral Q1200  . Pomalidomide  4 mg Oral Q1500  . potassium chloride SA  20 mEq Oral BID  . simvastatin  20 mg Oral QHS  . sodium chloride  3 mL Intravenous Q12H  . DISCONTD: furosemide  40 mg Intravenous BID   Continuous Infusions:  PRN Meds:.sodium chloride, acetaminophen, acetaminophen, albuterol, heparin lock flush, heparin lock flush, HYDROmorphone, ondansetron (ZOFRAN) IV, ondansetron, polyethylene glycol, sodium chloride, sodium chloride, traMADol  Assessment/Plan:   1. Acute on chronic diastolic heart failure (02/26/2012): Patient presented with SOBOE and orthopnea, progressive over about 2 months, associated with bilateral lower extremity edema. BNP was 5099, cardiac enzymes were un-elevated, effectively, ruling out ACS. D-Dimer is negative. 2D echocardiogram of 02/27/12, showed normal left ventricular cavity size, ejection fraction of 60% to 65% and no regional wall motion abnormalities. Thre was mild mitral  Regurgitation, mildly dilated left atrium and moderately Increased pulmonary artery pressure. PA peak  pressure: 57mm Hg (S). Dr Olga Millers provided cardiology consultation, and has opined that some of dyspnea most likely driven by anemia. Dr Tenny Craw has changed Lasix to 40 mg iv b.i.d on 03/02/12-03/03/12, and patient appeared to be tolerating this well. Today, she has been once again, transitioned by cardiology, to oral Lasix.  2. Epigastric discomfort (02/26/2012): This was likely secondary to GERD, and has resolved with GI  cocktail, followed by PPI.  3. Loose Bowel movement: Patient had loose bowel movements at presentation, and against a background of recent use of antibiotics for pneumonia with avelox and keflex and steroids, the possibility of C. Diff colitis was entertained. Fortunately, C. Diff PCR is negative. Patient has been reassured accordingly. Now on prn Imodium, and diarrhea has resolved.  4. Biateral leg Pain: Likely secondary to significant edema. Venous duplex negative for DVT. No longer problematic, as of 02/29/12.  5. Renal insufficiency: This is acute on chronic. Patient's baseline creatinine in March 2013 was 1.1. Creatinine on admission was 1.7. kidney ultrasound showed no evidence of hydronephrosis. Nephrotoxins are being avoided, but creatinine crept up gradually, since hospitalization, likely due to diuretic therapy. On 03/01/12, creatinine reach a high of was 1.96, but has started trending down, was 1.38 on 03/03/12 and 1.55 on 03/04/12. We shall need to monitor renal function closely. 6. Multiple Myeloma: This is refractory IgM multiple myeloma, now being treated with salvage Pomalidomide/Decadron. The Pomalidomide was started on 02/24/2012. Patient has a pancytopenia secondary to multiple myeloma and has had weekly blood transfusions for anemia, certainly, in the past one month. Thrombocytopenia is stable, and predated initiation of Pomalidomide. Dr Myrle Sheng provided hematology/oncology consultation. Anemia continues to be a persistent problem, and patient has been  transfused 3 units PRBC from  02/28/12-03/02/12, for a hemoglobin of 6.7. Today, hemoglobin is 7.2, and Dr Myrle Sheng has recommended transfer of additional 2 units PRBC. 7. Tinea interdigitalis: This was an incidental finding, affecting both feet. Per daughter, patient does tend to wears socks all the time. Addressing with topical Lotrimin AF. Have encouraged to expose feet more often. 8. Aphthous Ulcer: Patient has a single lesion in the inner  aspect of the left corner of her upper lip. Per patient, she has no appreciable discomfort from this. No specific treatment appears indicated, at this time. 9. Deconditioning: Patient appears deconditioned, due to her significant medical issues. Effort tolerance is poor. PT/OT is in progress.  Disposition: Per Hematology/Cardiology. Will likely transfer to [facility], if no objection by Dr Truett Perna.   LOS: 7 days   Colandra Ohanian,CHRISTOPHER 03/04/2012, 12:01 PM

## 2012-03-04 NOTE — Progress Notes (Signed)
IP PROGRESS NOTE  Subjective:   She continues to feel "weak". Complains of constipation. She received Decadron on 03/02/2012.  Objective: Vital signs in last 24 hours: Blood pressure 115/73, pulse 87, temperature 98 F (36.7 C), temperature source Oral, resp. rate 20, height 6' (1.829 m), weight 168 lb 6.9 oz (76.4 kg), SpO2 99.00%.  Intake/Output from previous day: 05/26 0701 - 05/27 0700 In: 480 [P.O.:240; I.V.:240] Out: 650 [Urine:650]  Physical Exam:  HEENT: No thrush or bleeding, mild scleral icterus Lungs:  decreased breath sounds at the lower posterior chest bilaterally with end inspiratory rhonchi Cardiac: Regular rate and rhythm Abdomen: Mild diffuse tenderness Extremities: Trace low pretibial and ankle edema bilaterally Skin: No petechiae, ecchymoses at the right arm and in the groin  Portacath/PICC-without erythema  Lab Results:  Basename 03/04/12 0452 03/03/12 0500  WBC 3.2* 3.4*  HGB 7.2* 7.7*  HCT 21.9* 23.0*  PLT 15* 14*   ANC 2.0 on 03/03/2012  Blood smear from 03/03/2012: The platelets are markedly decreased (1-2 per high-power field), rouleaux is present, rare nucleated red cell, the polychromasia is not increased, no schistocytes, rare spherocyte. The majority of the white cells are mature neutrophils. A few plasmacytoid lymphocytes.  BMET  Basename 03/04/12 0452 03/03/12 1100  NA 134* 133*  K 4.6 4.0  CL 100 98  CO2 24 22  GLUCOSE 112* 268*  BUN 56* 55*  CREATININE 1.55* 1.38*  CALCIUM 8.8 8.8    Studies/Results: No results found.  Medications: I have reviewed the patient's current medications.  Assessment/Plan:  1. Refractory IgM multiple myeloma-now being treated with salvage pomalidomide/Decadron, the pomalidomide was started on 02/24/2012. She completed weekly Decadron on 03/02/2012. 2. Pancytopenia secondary to multiple myeloma-status post repeat red blood cell transfusions. The severe thrombocytopenia predated the use of  pomalidomide. Severe anemia persist. Thrombocytopenia is stable. 3. Chronic back pain secondary to multiple myeloma and compression fractures 4. Renal insufficiency-? Secondary to multiple myeloma, the increased creatinine during this admission is likely related to diuresis. 5. Diastolic heart failure 6. History of a herpes zoster rash 7. Dyspnea secondary to anemia and diastolic heart failure-improved during this hospital admission 8. Hyperbilirubinemia (indirect)-? Intramedullary hemolysis or hemolysis related to repeat transfusions.    Recommendations: 1. continue pomalidomide and weekly Decadron- 2. check reticulocyte count, LDH, direct Coombs and repeat bilirubin 3. Management of volume status per medicine and cardiology 4. Increase ambulation as tolerated. 5. Transfuse 2 additional units of packed red blood cells  She remains weak. She is not ready for discharge.      LOS: 7 days   Ella Guillotte  03/04/2012, 9:18 AM

## 2012-03-04 NOTE — Progress Notes (Signed)
   78 y.o. female w/ PMHx significant for HTN, HLD, Chronic Diastolic CHF, and Multiple Myeloma who presented to [facility] on 02/26/2012 with complaints of chest pain and dyspnea.   SUBJECTIVE: Mildly dyspneic this AM; no chest pain; complains of constipation.     Marland Kitchen amitriptyline  20 mg Oral QHS  . clotrimazole   Topical BID  . docusate sodium  100 mg Oral BID  . feeding supplement  237 mL Oral BID BM  . furosemide  40 mg Intravenous BID  . levothyroxine  75 mcg Oral Daily  . metoprolol succinate  50 mg Oral Daily  . pantoprazole  40 mg Oral Q1200  . Pomalidomide  4 mg Oral Q1500  . potassium chloride SA  20 mEq Oral BID  . simvastatin  20 mg Oral QHS  . sodium chloride  3 mL Intravenous Q12H      OBJECTIVE: Physical Exam: Filed Vitals:   03/03/12 0626 03/03/12 1300 03/03/12 2232 03/04/12 0507  BP: 112/42 106/45 114/45 115/73  Pulse: 81 90 93 87  Temp: 97.4 F (36.3 C) 97.5 F (36.4 C) 97.7 F (36.5 C) 98 F (36.7 C)  TempSrc: Oral Oral Oral Oral  Resp: 20 20 22 20   Height:      Weight: 75.1 kg (165 lb 9.1 oz)   76.4 kg (168 lb 6.9 oz)  SpO2: 97% 100% 99% 99%    Intake/Output Summary (Last 24 hours) at 03/04/12 2130 Last data filed at 03/04/12 0700  Gross per 24 hour  Intake    480 ml  Output    650 ml  Net   -170 ml     GEN- The patient is chronically ill appearing, alert and oriented x 3 today.   Head- normocephalic, atraumatic Neck- supple Lungs- Clear to ausculation bilaterally Heart- Regular rate and rhythm,   GI- soft, NT, ND, + BS Extremities- trace to 1+ edema Neuro- strength and sensation are intact  LABS: Basic Metabolic Panel:  Basename 03/04/12 0452 03/03/12 1100  NA 134* 133*  K 4.6 4.0  CL 100 98  CO2 24 22  GLUCOSE 112* 268*  BUN 56* 55*  CREATININE 1.55* 1.38*  CALCIUM 8.8 8.8  MG -- --  PHOS -- --   CBC:  Basename 03/04/12 0452 03/03/12 0500  WBC 3.2* 3.4*  NEUTROABS -- 2.0  HGB 7.2* 7.7*  HCT 21.9* 23.0*  MCV  94.8 93.9  PLT 15* 14*     Assessment and Plan:  78 y.o. female w/ PMHx significant for HTN, HLD, Chronic Diastolic CHF, and Multiple Myeloma who presented to [facility] on 02/26/2012 with complaints of chest pain and dyspnea.   1. Dyspnea: Patient presents with ~50mos progressive dyspnea that worsened over the weekend after chemo treatment. Her dDimer is negative and her echo is unchanged from 8/12.  Her EF is preserved though she has moderate elevation in PA pressured. Change lasix to 40 mg po bid; follow renal function. Some of dyspnea most likely driven by anemia; transfusion per primary service.  2. Chest pain: No h/o CAD by cath 2007. No WMAs by echo . EKG without acute ischemic changes and troponin normal. Symptoms atypical for ACS.  Continue current medical therapy.  3. Acute on Chronic Diastolic CHF:  Change lasix to 40 mg po BID. Monitor renal function and electrolytes closely.  Prognosis appears to be poor. Management of multiple myeloma per hematology oncology.   Olga Millers, MD 03/04/2012 8:32 AM

## 2012-03-05 DIAGNOSIS — R1013 Epigastric pain: Secondary | ICD-10-CM

## 2012-03-05 DIAGNOSIS — N179 Acute kidney failure, unspecified: Secondary | ICD-10-CM

## 2012-03-05 DIAGNOSIS — I5031 Acute diastolic (congestive) heart failure: Secondary | ICD-10-CM

## 2012-03-05 DIAGNOSIS — C9000 Multiple myeloma not having achieved remission: Secondary | ICD-10-CM

## 2012-03-05 LAB — TYPE AND SCREEN
ABO/RH(D): A POS
Antibody Screen: NEGATIVE
Unit division: 0

## 2012-03-05 LAB — BASIC METABOLIC PANEL
BUN: 52 mg/dL — ABNORMAL HIGH (ref 6–23)
Creatinine, Ser: 1.43 mg/dL — ABNORMAL HIGH (ref 0.50–1.10)
GFR calc Af Amer: 38 mL/min — ABNORMAL LOW (ref >90)
GFR calc non Af Amer: 33 mL/min — ABNORMAL LOW (ref >90)

## 2012-03-05 LAB — CBC
MCH: 30.3 pg (ref 26.0–34.0)
MCV: 93.1 fL (ref 78.0–100.0)
Platelets: 13 10*3/uL — CL (ref 150–400)
RDW: 18.8 % — ABNORMAL HIGH (ref 11.5–15.5)

## 2012-03-05 NOTE — Progress Notes (Signed)
Physical Therapy Treatment Patient Details Name: Karen Barron MRN: [id number] DOB: 03-06-1926 Today's Date: 03/05/2012 Time: 0912-0930 PT Time Calculation (min): 18 min  PT Assessment / Plan / Recommendation Comments on Treatment Session  Per notes, ambulation distance is improving, however upon returning, pt continues to have SOB.  O2 sats were 100% on 2LO2 following ambulation.  Did some exercise in chair, however pt very fatigued, therefore deferred further exercise this session.     Follow Up Recommendations  Home health PT;Supervision/Assistance - 24 hour    Barriers to Discharge        Equipment Recommendations  None recommended by PT    Recommendations for Other Services    Frequency Min 2X/week   Plan Discharge plan remains appropriate;Frequency needs to be updated    Precautions / Restrictions Precautions Precautions: Fall Restrictions Weight Bearing Restrictions: No   Pertinent Vitals/Pain 6/10 pain in abdomen    Mobility  Bed Mobility Bed Mobility: Not assessed (Pt in recliner when PT arrived. ) Transfers Transfers: Sit to Stand;Stand to Sit Sit to Stand: 4: Min guard;With upper extremity assist;With armrests;From chair/3-in-1 Stand to Sit: 5: Supervision;With upper extremity assist;With armrests;To chair/3-in-1 Details for Transfer Assistance: Supervision and min/guard for safety with cues for hand placement.  Ambulation/Gait Ambulation/Gait Assistance: 4: Min guard Ambulation Distance (Feet): 65 Feet Assistive device: Rolling walker Ambulation/Gait Assistance Details: Continues to have slow gait speed.  Cues for safety when negotiating around obstacles, for upright posture and pursed lip breathing.  O2 sats 100% on 2LO2 via nasal cannula following ambulation.  Gait Pattern: Trunk flexed;Shuffle;Decreased hip/knee flexion - right;Decreased hip/knee flexion - left;Decreased stride length General Gait Details: Slow shuffle gait,     Exercises Total Joint  Exercises Ankle Circles/Pumps: AROM;Both;20 reps;Seated   PT Diagnosis:    PT Problem List:   PT Treatment Interventions:     PT Goals Acute Rehab PT Goals PT Goal Formulation: With patient Time For Goal Achievement: 03/12/12 Potential to Achieve Goals: Good Pt will go Sit to Stand: with modified independence PT Goal: Sit to Stand - Progress: Progressing toward goal Pt will go Stand to Sit: with modified independence PT Goal: Stand to Sit - Progress: Progressing toward goal Pt will Transfer Bed to Chair/Chair to Bed: with modified independence PT Transfer Goal: Bed to Chair/Chair to Bed - Progress: Progressing toward goal Pt will Ambulate: 51 - 150 feet;with modified independence;with least restrictive assistive device PT Goal: Ambulate - Progress: Progressing toward goal  Visit Information  Last PT Received On: 03/05/12 Assistance Needed: +1    Subjective Data  Subjective: My stomach hurts   Cognition  Overall Cognitive Status: Appears within functional limits for tasks assessed/performed Arousal/Alertness: Lethargic Orientation Level: Oriented X4 / Intact Behavior During Session: Lethargic Cognition - Other Comments: intact.    Balance     End of Session PT - End of Session Activity Tolerance: Patient limited by fatigue Patient left: in chair;with call bell/phone within reach;with family/visitor present Nurse Communication: Mobility status    Page, Meribeth Mattes 03/05/2012, 9:46 AM

## 2012-03-05 NOTE — Progress Notes (Signed)
Education with pt about turing self in bed. RN assisted turn and pt able to turn well. Attempt made to place pillow on right side to relieve pressure right buttock, pt was uncomfortable and refused. Placed minimally on left side. Admitted understanding to need to turn self.

## 2012-03-05 NOTE — Progress Notes (Signed)
Patient ID: Karen Barron, female   DOB: [birth year], 78 y.o.   MRN: [id number]  Assessment and plan:  Principal Problem:  * Multiple Myeloma:  - This is refractory IgM multiple myeloma, now being treated with salvage Pomalidomide (started 02/24/2012) and Decadron  - appreciate oncology following  Active Problems:  Pancytopenia - secondary to multiple myeloma and has had weekly blood transfusions for anemia, at least in the past one month.  - Thrombocytopenia is stable, and predated initiation of Pomalidomide.  - Anemia continues to be a persistent problem, and patient has been transfused total of 5 units PRBC from 02/28/12-03/04/12  Acute on chronic diastolic heart failure   - perhaps secondary to high output heart failure due to anemia  - BNP was 5099 - cardiac enzymes are within normal limits, D-dimer negative - 2D echocardiogram of 02/27/12, showed normal left ventricular cavity size, ejection fraction of 60% to 65% and no regional wall motion abnormalities. - Lasix to 40 mg iv b.i.d on 03/02/12-03/03/12 - at this time patient is on PO lasix  Epigastric discomfort   - This was likely secondary to GERD, and has resolved with GI cocktail, followed by PPI.   Loose Bowel movement:  - Patient had loose bowel movements at presentation - C. Diff PCR is negative. - diarrhea has resolved.   Biateral leg Pain:  - Likely secondary to significant edema.  - Venous duplex negative for DVT. No longer problematic, as of 02/29/12.   Renal insufficiency: - secondary to lasix - baseline creatinine in March 2013 was 1.1. Creatinine on admission was 1.7.  - kidney ultrasound showed no evidence of hydronephrosis - slowly trending down, today 1.43 (yesterday 1.55)  Tinea interdigitalis:  - This was an incidental finding, affecting both feet. Per daughter, patient does tend to wears socks all the time.  - Addressing with topical Lotrimin AF. Have encouraged to expose feet more often.   Aphthous  Ulcer:  - Patient has a single lesion in the inner aspect of the left corner of her upper lip with no appreciable discomfort from this. - No specific treatment appears indicated, at this time  Deconditioning:  - PT/OT is in progress.   Education - family and patient updated at bedside  Disposition - home with home PT when stable  Consults to date: 1. Oncology 2. Cardiology  Other consultants: 1. Physical Therapy   Subjective: No events overnight. Patient denies chest pain, shortness of breath, abdominal pain. Patient does report still feeling week especially after completing physical therapy.  Objective:  Vital signs in last 24 hours:  Filed Vitals:   03/05/12 0122 03/05/12 0220 03/05/12 0311 03/05/12 0632  BP: 118/76 149/80 121/76 130/73  Pulse: 81 81 82 78  Temp: 97.9 F (36.6 C) 97.8 F (36.6 C) 98.2 F (36.8 C) 97.4 F (36.3 C)  TempSrc: Oral Oral Oral Oral  Resp: 20 18 20 17   Height:      Weight:      SpO2:    100%    Intake/Output from previous day:   Intake/Output Summary (Last 24 hours) at 03/05/12 1224 Last data filed at 03/05/12 1030  Gross per 24 hour  Intake 588.33 ml  Output   1253 ml  Net -664.67 ml    Physical Exam: General: Alert, awake, oriented x3, in no acute distress. HEENT: No bruits, no goiter. Moist mucous membranes, no scleral icterus, no conjunctival pallor. Heart: Regular rate and rhythm, S1/S2 +, no murmurs, rubs, gallops. Lungs: Clear to auscultation  bilaterally. No wheezing, no rhonchi, no rales.  Abdomen: Soft, nontender, nondistended, positive bowel sounds. Extremities: (+2) LE edema pitting, pulses palpable bilaterally Neuro: Grossly nonfocal.  Lab Results:   Lab 03/05/12 0500 03/04/12 0452 03/03/12 0500 03/02/12 0553 03/01/12 0530  WBC 3.1* 3.2* 3.4* 2.7* 3.5*  HGB 8.4* 7.2* 7.7* 7.9* 8.1*  HCT 25.8* 21.9* 23.0* 24.0* 23.7*  PLT 13* 15* 14* 14* 15*  MCV 93.1 94.8 93.9 93.0 92.9    Lab 03/05/12 0500 03/04/12  0452 03/03/12 1100 03/03/12 0500 03/02/12 0553  NA 135 134* 133* 136 133*  K 4.6 4.6 4.0 4.2 4.2  CL 101 100 98 101 98  CO2 22 24 22 23 22   GLUCOSE 120* 112* 268* 205* 114*  BUN 52* 56* 55* 54* 51*  CREATININE 1.43* 1.55* 1.38* 1.38* 1.62*  CALCIUM 8.4 8.8 8.8 8.8 9.2    Medications: Scheduled Meds:   . amitriptyline  20 mg Oral QHS  . bisacodyl  10 mg Rectal Once  . docusate sodium  100 mg Oral BID  . furosemide  20 mg Intravenous Once  . furosemide  40 mg Oral BID  . levothyroxine  75 mcg Oral Daily  . metoprolol succinate  50 mg Oral Daily  . pantoprazole  40 mg Oral Q1200  . Pomalidomide  4 mg Oral Q1500  . potassium chloride SA  20 mEq Oral BID  . simvastatin  20 mg Oral QHS   Continuous Infusions:  PRN Meds:.sodium chloride, acetaminophen, acetaminophen, albuterol, heparin lock flush, heparin lock flush, HYDROmorphone, ondansetron (ZOFRAN) IV, ondansetron, polyethylene glycol, sodium chloride, sodium chloride, traMADol   EDUCATION - test results and diagnostic studies were discussed with patient and pt's family who was present at the bedside - patient and family have verbalized the understanding - questions were answered at the bedside and contact information was provided for additional questions or concerns   LOS: 8 days   Karen Barron 03/05/2012, 12:24 PM  [facility] Pager: [phone]

## 2012-03-05 NOTE — Progress Notes (Signed)
PC to K. Wellsite geologist. Clarification of blood order. Two units ordered at [facility] prior to transfer and additional remaining unit ordered again, once pt transferred to [facility] (per RN day shift report). Original order still in place also for 2 units. Will discontinue third unit  PRBC's.

## 2012-03-05 NOTE — Progress Notes (Signed)
Late entry for 03/04/2012 2300. Spoke with daughter at bedside and informed of Stage I area on right buttock.

## 2012-03-05 NOTE — Progress Notes (Signed)
Patient ID: Karen Barron, female   DOB: 09-29-1926, 78 y.o.   MRN: [id number]   78 y.o. female w/ PMHx significant for HTN, HLD, Chronic Diastolic CHF, and Multiple Myeloma who presented to [facility] on 02/26/2012 with complaints of chest pain and dyspnea.  Diastolic CHF cleared Transferred back to [facility] for Rx of myeloma and tranfusions  SUBJECTIVE: Weak no dyspnea or chest pain     . amitriptyline  20 mg Oral QHS  . bisacodyl  10 mg Rectal Once  . clotrimazole   Topical BID  . docusate sodium  100 mg Oral BID  . feeding supplement  237 mL Oral BID BM  . furosemide  20 mg Intravenous Once  . furosemide  40 mg Oral BID  . levothyroxine  75 mcg Oral Daily  . metoprolol succinate  50 mg Oral Daily  . pantoprazole  40 mg Oral Q1200  . Pomalidomide  4 mg Oral Q1500  . potassium chloride SA  20 mEq Oral BID  . simvastatin  20 mg Oral QHS  . sodium chloride  3 mL Intravenous Q12H  . DISCONTD: furosemide  40 mg Intravenous BID      OBJECTIVE: Physical Exam: Filed Vitals:   03/05/12 0122 03/05/12 0220 03/05/12 0311 03/05/12 0632  BP: 118/76 149/80 121/76 130/73  Pulse: 81 81 82 78  Temp: 97.9 F (36.6 C) 97.8 F (36.6 C) 98.2 F (36.8 C) 97.4 F (36.3 C)  TempSrc: Oral Oral Oral Oral  Resp: 20 18 20 17   Height:      Weight:      SpO2:    100%    Intake/Output Summary (Last 24 hours) at 03/05/12 0759 Last data filed at 03/05/12 5366  Gross per 24 hour  Intake 888.33 ml  Output   1502 ml  Net -613.67 ml     GEN- The patient is chronically ill appearing, alert and oriented x 3 today.   Head- normocephalic, atraumatic Neck- supple Lungs- Clear to ausculation bilaterally Heart- Regular rate and rhythm,   GI- soft, NT, ND, + BS Extremities- trace to 1+ edema Neuro- strength and sensation are intact  LABS: Basic Metabolic Panel:  Basename 03/05/12 0500 03/04/12 0452  NA 135 134*  K 4.6 4.6  CL 101 100  CO2 22 24  GLUCOSE 120* 112*  BUN 52* 56*    CREATININE 1.43* 1.55*  CALCIUM 8.4 8.8  MG -- --  PHOS -- --   CBC:  Basename 03/05/12 0500 03/04/12 0452 03/03/12 0500  WBC 3.1* 3.2* --  NEUTROABS -- -- 2.0  HGB 8.4* 7.2* --  HCT 25.8* 21.9* --  MCV 93.1 94.8 --  PLT 13* 15* --     Assessment and Plan:  78 y.o. female w/ PMHx significant for HTN, HLD, Chronic Diastolic CHF, and Multiple Myeloma who presented to [facility] on 02/26/2012 with complaints of chest pain and dyspnea.   1. Dyspnea: Patient presents with ~52mos progressive dyspnea that worsened over the weekend after chemo treatment. Her dDimer is negative and her echo is unchanged from 8/12.  Her EF is preserved though she has moderate elevation in PA pressured. Change lasix to 40 mg po bid; follow renal function. Some of dyspnea most likely driven by anemia; transfusion per primary service. Cardiac status stable  2. Chest pain: No h/o CAD by cath 2007. No WMAs by echo . EKG without acute ischemic changes and troponin normal. Symptoms atypical for ACS.  Continue current medical therapy.  3. Acute on Chronic Diastolic CHF:  Change lasix to 40 mg po BID. Monitor renal function and electrolytes closely.  Prognosis appears to be poor. Management of multiple myeloma per hematology oncology. 4. Anemia.  S/P transfusion with Hct now 25.8  PLT only 13 with bleeding risk  Will sign off as cardiac issues stable   Charlton Haws, MD 03/05/2012 7:59 AM

## 2012-03-05 NOTE — Progress Notes (Signed)
IP PROGRESS NOTE  Subjective:   Karen Barron continues to feel weak. She is working with physical therapy. She was able to ambulate to the nurse's station over the weekend. Her husband notes that she becomes short of breath when lying flat. She reports constipation.  Objective: Vital signs in last 24 hours: Blood pressure 102/46, pulse 90, temperature 98 F (36.7 C), temperature source Oral, resp. rate 18, height 5\' 4"  (1.626 m), weight 173 lb 1 oz (78.5 kg), SpO2 95.00%.  Intake/Output from previous day: 05/27 0701 - 05/28 0700 In: 888.3 [P.O.:560; I.V.:30; Blood:298.3] Out: 1502 [Urine:1500; Stool:2]  Physical Exam:  HEENT: No thrush or ulceration. No scleral icterus. Lungs:  decreased breath sounds at the lower posterior chest bilaterally with end inspiratory rhonchi. Cardiac: Regular rate and rhythm. Abdomen: Mild diffuse tenderness. Extremities: Trace low pretibial and ankle edema bilaterally.   Portacath/PICC-without erythema  Lab Results:  Basename 03/05/12 0500 03/04/12 0452  WBC 3.1* 3.2*  HGB 8.4* 7.2*  HCT 25.8* 21.9*  PLT 13* 15*   ANC 2.0 on 03/03/2012  Blood smear from 03/03/2012: The platelets are markedly decreased (1-2 per high-power field), rouleaux is present, rare nucleated red cell, the polychromasia is not increased, no schistocytes, rare spherocyte. The majority of the white cells are mature neutrophils. A few plasmacytoid lymphocytes.  BMET  Basename 03/05/12 0500 03/04/12 0452  NA 135 134*  K 4.6 4.6  CL 101 100  CO2 22 24  GLUCOSE 120* 112*  BUN 52* 56*  CREATININE 1.43* 1.55*  CALCIUM 8.4 8.8   Labs 03/04/2012-LDH 184, bilirubin 2.1, DAT negative.  Studies/Results: No results found.  Medications: I have reviewed the patient's current medications.  Assessment/Plan:  1. Refractory IgM multiple myeloma-now being treated with salvage pomalidomide/Decadron. The pomalidomide was started on 02/24/2012. She completed weekly Decadron on  03/02/2012. 2. Pancytopenia secondary to multiple myeloma-status post repeat red blood cell transfusions. The severe thrombocytopenia predated the use of pomalidomide. She was transfused 2 units of blood on 03/04/2012. Thrombocytopenia is stable. 3. Chronic back pain secondary to multiple myeloma and compression fractures 4. Renal insufficiency-? secondary to multiple myeloma. The increased creatinine during this admission is likely related to diuresis. 5. Diastolic heart failure. 6. History of a herpes zoster rash. 7. Dyspnea secondary to anemia and diastolic heart failure-improved during this hospital admission. 8. Hyperbilirubinemia (indirect). Bilirubin was improved on 03/04/2012. No evidence of hemolysis.   Recommendations: 1. Continue pomalidomide and weekly Decadron. 2. Management of volume status per medicine and cardiology 3. Increase ambulation as tolerated.  Patient interviewed and examined by Dr. Truett Perna; plan per Dr. Truett Perna.   I saw and examined the patient.      LOS: 8 days   Karen Barron  03/05/2012, 5:08 PM

## 2012-03-05 NOTE — Progress Notes (Signed)
CRITICAL VALUE ALERT  Critical value received:  Platelets 13  Date of notification:  03/05/2012  Time of notification: 0555  Critical value read back:yes  Nurse who received alert:  Nadine RN and notified Zella Ball, RN  MD notified (1st page):  [phone] Page to Kirtland Bouchard Schorr  Time of first page:  0600  Responding MD:[id number] Call back from [employer]. Notified of above critical value. Will review chart and place orders if needed.     Time MD responded:  [phone]

## 2012-03-06 ENCOUNTER — Other Ambulatory Visit: Payer: Medicare Other | Admitting: Lab

## 2012-03-06 ENCOUNTER — Ambulatory Visit: Payer: Medicare Other | Admitting: Nurse Practitioner

## 2012-03-06 ENCOUNTER — Other Ambulatory Visit: Payer: Medicare Other

## 2012-03-06 LAB — BASIC METABOLIC PANEL
Calcium: 8.5 mg/dL (ref 8.4–10.5)
GFR calc non Af Amer: 37 mL/min — ABNORMAL LOW (ref >90)
Glucose, Bld: 104 mg/dL — ABNORMAL HIGH (ref 70–99)
Sodium: 134 mEq/L — ABNORMAL LOW (ref 135–145)

## 2012-03-06 LAB — CBC
MCH: 30.4 pg (ref 26.0–34.0)
Platelets: 13 10*3/uL — CL (ref 150–400)
RBC: 2.63 MIL/uL — ABNORMAL LOW (ref 3.87–5.11)
RDW: 18.5 % — ABNORMAL HIGH (ref 11.5–15.5)

## 2012-03-06 MED ORDER — DEXAMETHASONE 6 MG PO TABS
40.0000 mg | ORAL_TABLET | Freq: Once | ORAL | Status: AC
  Administered 2012-03-09: 40 mg via ORAL
  Filled 2012-03-06: qty 1

## 2012-03-06 MED ORDER — BOOST / RESOURCE BREEZE PO LIQD
1.0000 | Freq: Three times a day (TID) | ORAL | Status: DC
  Administered 2012-03-07 – 2012-03-11 (×10): 1 via ORAL

## 2012-03-06 NOTE — Plan of Care (Signed)
Problem: Inadequate Intake (NI-2.1) Goal: Food and/or nutrient delivery Individualized approach for food/nutrient provision.  Outcome: Not Progressing Meal intake recently 0-25%, not drinking Ensure

## 2012-03-06 NOTE — Progress Notes (Signed)
Patient ID: Lonia Mad, female   DOB: [birth year], 78 y.o.   MRN: [id number]  Assessment and plan:   Principal Problem:   * Multiple Myeloma:  - This is refractory IgM multiple myeloma, now being treated with salvage Pomalidomide (started 02/24/2012) and Decadron  - appreciate oncology following   Active Problems:   Pancytopenia  - secondary to multiple myeloma and has had weekly blood transfusions for anemia, at least in the past one month.  - Thrombocytopenia is stable, and predated initiation of Pomalidomide.  - Anemia continues to be a persistent problem, and patient has been transfused total of 5 units PRBC from 02/28/12-03/04/12  - per oncology recommendation will transfuse platelets if less than 10  Acute on chronic diastolic heart failure  - perhaps secondary to high output heart failure due to anemia  - BNP was 5099  - cardiac enzymes are within normal limits, D-dimer negative  - 2D echocardiogram of 02/27/12, showed normal left ventricular cavity size, ejection fraction of 60% to 65% and no regional wall motion abnormalities. - Lasix to 40 mg iv b.i.d on 03/02/12-03/03/12  - at this time patient is on PO lasix   Epigastric discomfort  - This was likely secondary to GERD, and has resolved with GI cocktail, followed by PPI.   Loose Bowel movement:  - Patient had loose bowel movements at presentation  - C. Diff PCR is negative.  - diarrhea has resolved.   Biateral leg Pain:  - Likely secondary to significant edema.  - Venous duplex negative for DVT. No longer problematic, as of 02/29/12.   Acute kidney injury - secondary to lasix  - baseline creatinine in March 2013 was 1.1. Creatinine on admission was 1.7.  - kidney ultrasound showed no evidence of hydronephrosis  - slowly trending down, Creatinine 1.3 today   Tinea interdigitalis:  - This was an incidental finding, affecting both feet. Per daughter, patient does tend to wears socks all the time.  - Addressing with  topical Lotrimin AF. Have encouraged to expose feet more often.   Aphthous Ulcer:  - Patient has a single lesion in the inner aspect of the left corner of her upper lip with no appreciable discomfort from this.  - No specific treatment appears indicated, at this time   Deconditioning:  - PT/OT is in progress.   Education  - family and patient updated at bedside   Disposition  - home with home PT when stable vs SNF   Consults to date:  1. Oncology  2. Cardiology   Other consultants:  1. Physical Therapy   Subjective: No events overnight.   Objective:  Vital signs in last 24 hours:  Filed Vitals:   03/05/12 2215 03/06/12 0545 03/06/12 0916 03/06/12 1322  BP: 116/71 119/46 120/50   Pulse: 80 87 88 82  Temp: 97.3 F (36.3 C) 98.7 F (37.1 C)  98.3 F (36.8 C)  TempSrc: Oral Oral  Oral  Resp: 17 18  18   Height:      Weight:  78.245 kg (172 lb 8 oz)    SpO2: 97% 100%  97%    Intake/Output from previous day:   Intake/Output Summary (Last 24 hours) at 03/06/12 1944 Last data filed at 03/06/12 1932  Gross per 24 hour  Intake    720 ml  Output    750 ml  Net    -30 ml    Physical Exam: General: Alert, awake, oriented x3, in no acute distress. HEENT: No bruits,  no goiter. Moist mucous membranes, no scleral icterus, no conjunctival pallor. Heart: Regular rate and rhythm, S1/S2 +, no murmurs, rubs, gallops. Lungs: Clear to auscultation bilaterally. No wheezing, no rhonchi, no rales.  Abdomen: Soft, nontender, nondistended, positive bowel sounds. Extremities: No clubbing or cyanosis, (+2) LE  pitting edema,  positive pedal pulses. Neuro: Grossly nonfocal.  Lab Results:   Lab 03/06/12 0530 03/05/12 0500 03/04/12 0452 03/03/12 0500 03/02/12 0553 02/29/12 0610  WBC 3.2* 3.1* 3.2* 3.4* 2.7* --  HGB 8.0* 8.4* 7.2* 7.7* 7.9* --  HCT 24.7* 25.8* 21.9* 23.0* 24.0* --  PLT 13* 13* 15* 14* 14* --  MCV 93.9 93.1 94.8 93.9 93.0 --    Lab 03/06/12 0530 03/05/12 0500  03/04/12 0452 03/03/12 1100 03/03/12 0500  NA 134* 135 134* 133* 136  K 4.7 4.6 4.6 4.0 4.2  CL 99 101 100 98 101  CO2 24 22 24 22 23   GLUCOSE 104* 120* 112* 268* 205*  BUN 44* 52* 56* 55* 54*  CREATININE 1.30* 1.43* 1.55* 1.38* 1.38*  CALCIUM 8.5 8.4 8.8 8.8 8.8  MG -- -- -- -- --    Medications: Scheduled Meds:   . amitriptyline  20 mg Oral QHS  . bisacodyl  10 mg Rectal Once  . clotrimazole   Topical BID  . dexamethasone  40 mg Oral Once  . docusate sodium  100 mg Oral BID  . feeding supplement  1 Container Oral TID BM  . furosemide  40 mg Oral BID  . levothyroxine  75 mcg Oral Daily  . metoprolol succinate  50 mg Oral Daily  . pantoprazole  40 mg Oral Q1200  . Pomalidomide  4 mg Oral Q1500  . potassium chloride SA  20 mEq Oral BID  . simvastatin  20 mg Oral QHS  . sodium chloride  3 mL Intravenous Q12H  . DISCONTD: feeding supplement  237 mL Oral BID BM   Continuous Infusions:  PRN Meds:.sodium chloride, acetaminophen, acetaminophen, albuterol, heparin lock flush, heparin lock flush, HYDROmorphone, ondansetron (ZOFRAN) IV, ondansetron, polyethylene glycol, sodium chloride, sodium chloride, traMADol   LOS: 9 days   Jonessa Triplett 03/06/2012, 7:44 PM  [facility] Pager: [phone]

## 2012-03-06 NOTE — Progress Notes (Signed)
IP PROGRESS NOTE  Subjective:   She complains of shortness of breath with ambulation. No bleeding.  Objective: Vital signs in last 24 hours: Blood pressure 120/50, pulse 82, temperature 98.3 F (36.8 C), temperature source Oral, resp. rate 18, height 5\' 4"  (1.626 m), weight 172 lb 8 oz (78.245 kg), SpO2 97.00%.  Intake/Output from previous day: 05/28 0701 - 05/29 0700 In: 360 [P.O.:360] Out: 1101 [Urine:1100; Stool:1]  Physical Exam:  HEENT: No thrush or ulceration. Mild scleral icterus, a few petechiae at the buccal mucosa bilaterally Lungs:  decreased breath sounds at the lower posterior chest bilaterally with inspiratory rhonchi Cardiac: Regular rate and rhythm. Extremities: Trace-1+ low pretibial and ankle edema bilaterally. Tenderness at the low leg bilaterally   Portacath/PICC-without erythema  Lab Results:  Basename 03/06/12 0530 03/05/12 0500  WBC 3.2* 3.1*  HGB 8.0* 8.4*  HCT 24.7* 25.8*  PLT 13* 13*   ANC 2.0 on 03/03/2012  Blood smear from 03/03/2012: The platelets are markedly decreased (1-2 per high-power field), rouleaux is present, rare nucleated red cell, the polychromasia is not increased, no schistocytes, rare spherocyte. The majority of the white cells are mature neutrophils. A few plasmacytoid lymphocytes.  BMET  Basename 03/06/12 0530 03/05/12 0500  NA 134* 135  K 4.7 4.6  CL 99 101  CO2 24 22  GLUCOSE 104* 120*  BUN 44* 52*  CREATININE 1.30* 1.43*  CALCIUM 8.5 8.4   Labs 03/04/2012-LDH 184, bilirubin 2.1, DAT negative.  Studies/Results: No results found.  Medications: I have reviewed the patient's current medications.  Assessment/Plan:  1. Refractory IgM multiple myeloma-now being treated with salvage pomalidomide/Decadron. The pomalidomide was started on 02/24/2012. She completed weekly Decadron on 03/02/2012. The next dose of Decadron is due on 03/09/2012. 2. Pancytopenia secondary to multiple myeloma-status post repeat red blood cell  transfusions. The severe thrombocytopenia predated the use of pomalidomide. She was transfused 2 units of blood on 03/04/2012. Thrombocytopenia is stable. 3. Chronic back pain secondary to multiple myeloma and compression fractures 4. Renal insufficiency-? secondary to multiple myeloma. The increased creatinine during this admission was likely related to diuresis. 5. Diastolic heart failure. 6. History of a herpes zoster rash. 7. Dyspnea secondary to anemia and diastolic heart failure-persistent 8. Hyperbilirubinemia (indirect). Bilirubin was improved on 03/04/2012. No evidence of hemolysis.   Recommendations: 1. Continue pomalidomide and weekly Decadron. 2. Management of volume status per medicine and cardiology 3. Increase ambulation as tolerated. 4. Consider nursing home placement if she is not ready for discharge within the next one to 2 days-I discussed this with the patient and her husband today 5. I will address CODE STATUS with Ms. Settlemire on 03/07/2012. 6. Transfuse platelets for bleeding or a count of less than 10,000  Please call as needed. I will see Ms. Odonnell in the a.m. on 03/07/2012.      LOS: 9 days   Azhar Yogi  03/06/2012, 2:14 PM

## 2012-03-06 NOTE — Progress Notes (Signed)
CARE MANAGEMENT NOTE 03/06/2012  Patient:  Karen Barron, Karen Barron   Account Number:  [id number]  Date Initiated:  03/06/2012  Documentation initiated by:  Levander Katzenstein  Subjective/Objective Assessment:   78 yo female admitted 02/26/12 with dyspnea     Action/Plan:   D/C when medically stable   Anticipated DC Date:  03/09/2012   Anticipated DC Plan:    In-house referral  Clinical Social Worker      DC Planning Services  CM consult             Status of service:  In process, will continue to follow  Per UR Regulation:  Reviewed for med. necessity/level of care/duration of stay  If discussed at Long Length of Stay Meetings, dates discussed:   03/06/2012    Comments:  03/06/12, Kathi Der RNC-MNN, BSN, [phone], CM spoke with Boneta Lucks at [facility]-pt is not a candidate for LTAC.  CM informed CSW.  Per MD's note, pt will need nursing home placement if not able to discharge within 1-2 days. 03/06/12, Kathi Der RNC-MNN, BSN, [phone].  CM received referral for LTAC.  Boneta Lucks at [facility] of referral, [phone].  Awaiting determination for LTAC referral.  CSW aware as well.  CSW to explore possible options for pt if LTAC not a possibility.  Will follow.

## 2012-03-06 NOTE — Progress Notes (Signed)
Nutrition Follow-up  Diet Order: Heart healthy, Ensure Complete BID  - Pt transferred from [facility] on 03/05/12 for treatment of multiple myeloma. Pt getting pomalidomide and weekly Decadron. Pt in bathroom, discussed meal intake with family member, who reports pt with minimal intake today r/t having 3 BMs, improved from 6-8 BMs yesterday. Family member reports last night she ate 100% of sweet potato and some fruit, but no Ensure b/c pt thinks it tastes too sweet. Met with pt after she got out of the bathroom and gave her a sample of diluted [employer], which she enjoyed, will order. Pt reports it's hard to eat because she does not feel well. Pt's daughter has been encouraging pt to eat. Meal intake has been <50% since pt transferred to [facility] despite family/staff encouragement and supplementation, and family reports pt has not eaten well "in a long time".  Recommend MD discuss nutrition support versus palliative care.   Meds: Scheduled Meds:   . amitriptyline  20 mg Oral QHS  . bisacodyl  10 mg Rectal Once  . clotrimazole   Topical BID  . docusate sodium  100 mg Oral BID  . feeding supplement  237 mL Oral BID BM  . furosemide  40 mg Oral BID  . levothyroxine  75 mcg Oral Daily  . metoprolol succinate  50 mg Oral Daily  . pantoprazole  40 mg Oral Q1200  . Pomalidomide  4 mg Oral Q1500  . potassium chloride SA  20 mEq Oral BID  . simvastatin  20 mg Oral QHS  . sodium chloride  3 mL Intravenous Q12H   Continuous Infusions:  PRN Meds:.sodium chloride, acetaminophen, acetaminophen, albuterol, heparin lock flush, heparin lock flush, HYDROmorphone, ondansetron (ZOFRAN) IV, ondansetron, polyethylene glycol, sodium chloride, sodium chloride, traMADol  Labs:  CMP     Component Value Date/Time   NA 134* 03/06/2012 0530   NA 136 01/22/2012 1118   K 4.7 03/06/2012 0530   K 3.5 01/22/2012 1118   CL 99 03/06/2012 0530   CL 98 01/22/2012 1118   CO2 24 03/06/2012 0530   CO2 28 01/22/2012 1118     GLUCOSE 104* 03/06/2012 0530   GLUCOSE 96 01/22/2012 1118   BUN 44* 03/06/2012 0530   BUN 15 01/22/2012 1118   CREATININE 1.30* 03/06/2012 0530   CREATININE 1.1 01/22/2012 1118   CREATININE 1.08 10/18/2006 1023   CALCIUM 8.5 03/06/2012 0530   CALCIUM 8.3 01/22/2012 1118   PROT 9.0* 03/04/2012 1055   ALBUMIN 2.6* 03/04/2012 1055   AST 17 03/04/2012 1055   ALT 44* 03/04/2012 1055   ALKPHOS 41 03/04/2012 1055   BILITOT 2.1* 03/04/2012 1055   GFRNONAA 37* 03/06/2012 0530   GFRAA 43* 03/06/2012 0530     Intake/Output Summary (Last 24 hours) at 03/06/12 1349 Last data filed at 03/06/12 0756  Gross per 24 hour  Intake    480 ml  Output   1100 ml  Net   -620 ml   Last BM - 03/06/12 per pt reort  Weight Status:   5/24 165 lb 12.6 oz 5/29 172 lb 8 oz  Re-estimated needs: 1875-2100 calories 95-120g protein  Nutrition Dx: Inadequate oral intake - ongoing  Goal: Pt to have 75% of meal/supplement intake - not met  Intervention: Resource Breeze TID, nursing to dilute to less than half strength per pt preference.   Monitor: Weights, labs, intake, goals of nutrition, BM   Marshall Cork Pager #: [phone]

## 2012-03-07 LAB — CBC
MCH: 30 pg (ref 26.0–34.0)
MCHC: 32 g/dL (ref 30.0–36.0)
MCV: 93.8 fL (ref 78.0–100.0)
Platelets: 14 10*3/uL — CL (ref 150–400)
RBC: 2.57 MIL/uL — ABNORMAL LOW (ref 3.87–5.11)
RDW: 18 % — ABNORMAL HIGH (ref 11.5–15.5)

## 2012-03-07 LAB — BASIC METABOLIC PANEL
BUN: 41 mg/dL — ABNORMAL HIGH (ref 6–23)
CO2: 24 mEq/L (ref 19–32)
Calcium: 8.6 mg/dL (ref 8.4–10.5)
Creatinine, Ser: 1.36 mg/dL — ABNORMAL HIGH (ref 0.50–1.10)
GFR calc non Af Amer: 35 mL/min — ABNORMAL LOW (ref >90)
Glucose, Bld: 127 mg/dL — ABNORMAL HIGH (ref 70–99)
Sodium: 133 mEq/L — ABNORMAL LOW (ref 135–145)

## 2012-03-07 LAB — DIFFERENTIAL
Basophils Absolute: 0 10*3/uL (ref 0.0–0.1)
Eosinophils Absolute: 0 10*3/uL (ref 0.0–0.7)
Lymphs Abs: 0.9 10*3/uL (ref 0.7–4.0)
Monocytes Absolute: 0.5 10*3/uL (ref 0.1–1.0)
Neutrophils Relative %: 39 % — ABNORMAL LOW (ref 43–77)

## 2012-03-07 NOTE — Progress Notes (Signed)
Patient ID: Karen Barron, female   DOB: Jan 08, 1926, 78 y.o.   MRN: [id number]  Assessment and plan:   Principal Problem:   * Multiple Myeloma:  - This is refractory IgM multiple myeloma, now being treated with salvage Pomalidomide (started 02/24/2012) and Decadron  - appreciate oncology following   Active Problems:   Pancytopenia  - secondary to multiple myeloma and has had weekly blood transfusions for anemia, at least in the past one month.  - Thrombocytopenia is stable, and predated initiation of Pomalidomide.  - Anemia continues to be a persistent problem, and patient has been transfused total of 5 units PRBC from 02/28/12-03/04/12  - hemoglobin today 7.7; will monitor per oncology recommendations  - per oncology recommendation will transfuse platelets if less than 10   Acute on chronic diastolic heart failure  - perhaps secondary to high output heart failure due to anemia  - BNP was 5099  - cardiac enzymes are within normal limits, D-dimer negative  - 2D echocardiogram of 02/27/12, showed normal left ventricular cavity size, ejection fraction of 60% to 65% and no regional wall motion abnormalities. - Lasix to 40 mg iv b.i.d on 03/02/12-03/03/12  - at this time patient is on PO lasix   Epigastric discomfort  - This was likely secondary to GERD, and has resolved with GI cocktail, followed by PPI.   Loose Bowel movement:  - Patient had loose bowel movements at presentation  - C. Diff PCR is negative.  - diarrhea has resolved.   Biateral leg Pain:  - Likely secondary to significant edema.  - Venous duplex negative for DVT. No longer problematic, as of 02/29/12.   Acute kidney injury  - due to myeloma kidney - baseline creatinine in March 2013 was 1.1. Creatinine on admission was 1.7.  - kidney ultrasound showed no evidence of hydronephrosis  - stable, Creatinine 1.36 today   Tinea interdigitalis:  - This was an incidental finding, affecting both feet. Per daughter, patient  does tend to wears socks all the time.  - Addressing with topical Lotrimin AF. Have encouraged to expose feet more often.   Aphthous Ulcer:  - Patient has a single lesion in the inner aspect of the left corner of her upper lip with no appreciable discomfort from this.  - No specific treatment appears indicated, at this time   Deconditioning:  - PT/OT evaluation - following  Education  - family and patient updated at bedside   Disposition  - home with home PT; anticipate D/C in next 24 or 48 hours  Consults to date:  1. Oncology  2. Cardiology   Other consultants:  1. Physical Therapy    Subjective: No events overnight. Patient denies chest pain, shortness of breath, abdominal pain. Had bowel movement and reports ambulating.  Objective:  Vital signs in last 24 hours:  Filed Vitals:   03/06/12 1322 03/06/12 2110 03/07/12 0440 03/07/12 1400  BP:  131/78 112/60 104/46  Pulse: 82 82 82 87  Temp: 98.3 F (36.8 C) 97.9 F (36.6 C) 97.3 F (36.3 C) 97 F (36.1 C)  TempSrc: Oral Oral Oral Oral  Resp: 18 18 17 20   Height:      Weight:   76.9 kg (169 lb 8.5 oz)   SpO2: 97% 96% 100% 97%    Intake/Output from previous day:   Intake/Output Summary (Last 24 hours) at 03/07/12 1402 Last data filed at 03/07/12 1154  Gross per 24 hour  Intake    745 ml  Output      0 ml  Net    745 ml    Physical Exam: General: Alert, awake, oriented x3, in no acute distress. HEENT: No bruits, no goiter. Moist mucous membranes, no scleral icterus, no conjunctival pallor. Heart: Regular rate and rhythm, S1/S2 +, no murmurs, rubs, gallops. Lungs: Clear to auscultation bilaterally. No wheezing, no rhonchi, no rales.  Abdomen: Soft, nontender, nondistended, positive bowel sounds. Extremities: (+2) LE edema; pulses palpable Neuro: Grossly nonfocal.  Lab Results:  Lab 03/07/12 0532 03/06/12 0530 03/05/12 0500 03/04/12 0452 03/03/12 0500  WBC 2.4* 3.2* 3.1* 3.2* 3.4*  HGB 7.7* 8.0* 8.4*  7.2* 7.7*  HCT 24.1* 24.7* 25.8* 21.9* 23.0*  PLT 14* 13* 13* 15* 14*  MCV 93.8 93.9 93.1 94.8 93.9    Lab 03/07/12 0532 03/06/12 0530 03/05/12 0500 03/04/12 0452 03/03/12 1100  NA 133* 134* 135 134* 133*  K 4.3 4.7 4.6 4.6 4.0  CL 97 99 101 100 98  CO2 24 24 22 24 22   GLUCOSE 127* 104* 120* 112* 268*  BUN 41* 44* 52* 56* 55*  CREATININE 1.36* 1.30* 1.43* 1.55* 1.38*  CALCIUM 8.6 8.5 8.4 8.8 8.8   Medications: Scheduled Meds:   . amitriptyline  20 mg Oral QHS  . bisacodyl  10 mg Rectal Once  . clotrimazole   Topical BID  . dexamethasone  40 mg Oral Once  . docusate sodium  100 mg Oral BID  . feeding supplement  1 Container Oral TID BM  . furosemide  40 mg Oral BID  . levothyroxine  75 mcg Oral Daily  . metoprolol succinate  50 mg Oral Daily  . pantoprazole  40 mg Oral Q1200  . Pomalidomide  4 mg Oral Q1500  . potassium chloride SA  20 mEq Oral BID  . simvastatin  20 mg Oral QHS  . sodium chloride  3 mL Intravenous Q12H   Continuous Infusions:  PRN Meds:.sodium chloride, acetaminophen, acetaminophen, albuterol, heparin lock flush, heparin lock flush, HYDROmorphone, ondansetron (ZOFRAN) IV, ondansetron, polyethylene glycol, sodium chloride, sodium chloride, traMADol   LOS: 10 days   Skarleth Delmonico 03/07/2012, 2:02 PM  [facility] Pager: [phone]

## 2012-03-07 NOTE — Progress Notes (Signed)
Occupational Therapy Treatment Patient Details Name: Karen Barron MRN: [id number] DOB: 1926-07-12 Today's Date: 03/07/2012 Time: 4098-1191 OT Time Calculation (min): 16 min  OT Assessment / Plan / Recommendation Comments on Treatment Session Pt limited by pain and fatigue, but able to participate.  Has excellent support.    Follow Up Recommendations  Home health OT    Barriers to Discharge       Equipment Recommendations  None recommended by OT    Recommendations for Other Services    Frequency Min 2X/week   Plan Discharge plan remains appropriate    Precautions / Restrictions Precautions Precautions: Fall Restrictions Weight Bearing Restrictions: No   Pertinent Vitals/Pain Abdominal pain, did not rate.    ADL  Grooming: Performed;Wash/dry hands;Min guard Where Assessed - Grooming: Supported standing Lower Body Dressing: Performed;Maximal assistance (pt unable to access feet due to abdominal pain) Where Assessed - Lower Body Dressing: Unsupported sitting Toilet Transfer: Minimal assistance;Performed Toilet Transfer Method: Stand pivot Acupuncturist: Bedside commode Toileting - Clothing Manipulation and Hygiene: Performed;Supervision/safety Equipment Used: Rolling walker ADL Comments: Instructed pt in pacing, energy conservation.    OT Diagnosis:    OT Problem List:   OT Treatment Interventions:     OT Goals ADL Goals Pt Will Perform Grooming: with supervision;Standing at sink ADL Goal: Grooming - Progress: Progressing toward goals Pt Will Perform Lower Body Dressing: with supervision;with adaptive equipment;Sit to stand from chair ADL Goal: Lower Body Dressing - Progress: Not met Additional ADL Goal #1: Pt will complete all aspects of toileting on comfort hight commode with rails and S. ADL Goal: Additional Goal #1 - Progress: Progressing toward goals Arm Goals Additional Arm Goal #1: Pt will be proficient in SROM of L shoulder to increase ROM in  shoulder to assist with UE adls. Arm Goal: Additional Goal #1 - Progress: Progressing toward goals  Visit Information  Last OT Received On: 03/07/12 Assistance Needed: +1    Subjective Data      Prior Functioning       Cognition  Overall Cognitive Status: Appears within functional limits for tasks assessed/performed Arousal/Alertness: Awake/alert Orientation Level: Oriented X4 / Intact Behavior During Session: Flat affect Cognition - Other Comments: pt does does not talk much.  She had difficulty coordinating movements to do incentive spirometry properly.  Pt does not appear motivated to walk or exercise, though she will do whatever is asked of her    Mobility Bed Mobility Bed Mobility: Sit to Supine Sit to Supine: 4: Min assist;HOB flat Details for Bed Mobility Assistance: needs assist to lift legs up onto bed. Pt immediately asks to have head of  bed raised Transfers Sit to Stand: 4: Min guard;With upper extremity assist;With armrests;From chair/3-in-1 Stand to Sit: 5: Supervision;With upper extremity assist;With armrests;To chair/3-in-1 Details for Transfer Assistance: Supervision and min/guard for safety with cues for hand placement.    Exercises  Shoulder Exercises Shoulder Flexion: Self ROM;Left;15 reps;Seated   Balance Balance Balance Assessed: Yes Static Standing Balance Static Standing - Balance Support: Bilateral upper extremity supported (with RW) Static Standing - Level of Assistance: 5: Stand by assistance Static Standing - Comment/# of Minutes: 2  End of Session OT - End of Session Equipment Utilized During Treatment: Gait belt Activity Tolerance: Patient limited by pain;Patient limited by fatigue Patient left: in chair;with family/visitor present;with call bell/phone within reach Nurse Communication: Other (comment) (pts pain level)   Evern Bio 03/07/2012, 1:37 PM

## 2012-03-07 NOTE — Progress Notes (Signed)
IP PROGRESS NOTE  Subjective:   She feels "weak". No bleeding.  Objective: Vital signs in last 24 hours: Blood pressure 104/46, pulse 87, temperature 97 F (36.1 C), temperature source Oral, resp. rate 20, height 5\' 4"  (1.626 m), weight 169 lb 8.5 oz (76.9 kg), SpO2 97.00%.  Intake/Output from previous day: 05/29 0701 - 05/30 0700 In: 960 [P.O.:960] Out: -   Physical Exam:  HEENT: No thrush or ulceration. Small ecchymosis at the left buccal mucosa Lungs:  decreased breath sounds at the lower posterior chest bilaterally with inspiratory rhonchi Cardiac: Regular rate and rhythm. Extremities: Trace-1+ low pretibial and ankle edema bilaterally. Tenderness at the low leg bilaterally   Portacath/PICC-without erythema  Lab Results:  Basename 03/07/12 0532 03/06/12 0530  WBC 2.4* 3.2*  HGB 7.7* 8.0*  HCT 24.1* 24.7*  PLT 14* 13*   ANC 1.0 BMET  Basename 03/07/12 0532 03/06/12 0530  NA 133* 134*  K 4.3 4.7  CL 97 99  CO2 24 24  GLUCOSE 127* 104*  BUN 41* 44*  CREATININE 1.36* 1.30*  CALCIUM 8.6 8.5     Studies/Results: No results found.  Medications: I have reviewed the patient's current medications.  Assessment/Plan:  1. Refractory IgM multiple myeloma-now being treated with salvage pomalidomide/Decadron. The pomalidomide was started on 02/24/2012. She completed weekly Decadron on 03/02/2012. The next dose of Decadron is due on 03/09/2012. 2. Pancytopenia secondary to multiple myeloma-status post repeat red blood cell transfusions. The severe thrombocytopenia predated the use of pomalidomide. She was transfused 2 units of blood on 03/04/2012. Thrombocytopenia is stable. 3. Chronic back pain secondary to multiple myeloma and compression fractures 4. Renal insufficiency-? secondary to multiple myeloma. The increased creatinine during this admission was likely related to diuresis. 5. Diastolic heart failure. 6. History of a herpes zoster rash. 7. Dyspnea secondary  to anemia and diastolic heart failure-persistent 8. Hyperbilirubinemia (indirect). Bilirubin was improved on 03/04/2012. No evidence of hemolysis.   Recommendations: 1. Continue pomalidomide and weekly Decadron. 2. Management of volume status per medicine and cardiology 3. Increase ambulation as tolerated. 4. Care management consult for short-term nursing home placement versus home nursing/physical therapy 5. Check white cell differential on 03/08/2012  Please call as needed. I will see Karen Barron in the afternoon on 03/08/2012.  I discussed her status with Mr. Kerman on may 30th 2013.    LOS: 10 days   Karen Barron  03/07/2012, 6:05 PM

## 2012-03-07 NOTE — Care Management Note (Unsigned)
    Page 1 of 2   03/07/2012     2:58:16 PM   CARE MANAGEMENT NOTE 03/07/2012  Patient:  Karen Barron, Karen Barron   Account Number:  [id number]  Date Initiated:  03/06/2012  Documentation initiated by:  CRAFT,TERRI  Subjective/Objective Assessment:   78 yo female admitted 02/26/12 with dyspnea     Action/Plan:   D/C when medically stable   Anticipated DC Date:  03/08/2012   Anticipated DC Plan:  HOME W HOME HEALTH SERVICES  In-house referral  Clinical Social Worker      DC Planning Services  CM consult      Choice offered to / List presented to:  C-1 Patient           Status of service:  In process, will continue to follow Medicare Important Message given?   (If response is "NO", the following Medicare IM given date fields will be blank) Date Medicare IM given:   Date Additional Medicare IM given:    Discharge Disposition:    Per UR Regulation:  Reviewed for med. necessity/level of care/duration of stay  If discussed at Long Length of Stay Meetings, dates discussed:   03/06/2012    Comments:  03/07/12 Elinora Weigand RN,BSN NCM [phone] SPOKE TO SPOUSE ABOUT DME-HOSPITAL BED.SPOUSE FEELS THEY DO NOT NEED HOSPITAL BED,THEY HAVE A RECLINER.[facility] CHOSEN FOR HH,WILL NEED ORDERS,& F2F. PT-SNF VS HH/24HR SUPV.PATIENT DECLINES SNF.HAS GOOD FAMILY SUPPORT,CANE,RW,ELEVATED TOILET SEAT,WANTS HOME [facility] CHOSEN.[facility] CHOSEN FOR HH.HAS A PORT-A-CATH.IF MD AGREE W/HH.PLEASE PUT IN HHRN W/SPECIFIC LAB DRAW IF VIA PORTA-CATH W/FLUSH ORDERS,HHPT/OT,& FACE TO FACE ENCOUNTER.NURSE UPDATED.  03/06/12, Kathi Der RNC-MNN, BSN, [phone], CM spoke with Boneta Lucks at [facility]-pt is not a candidate for LTAC.  CM informed CSW.  Per MD's note, pt will need nursing home placement if not able to discharge within 1-2 days. 03/06/12, Kathi Der RNC-MNN, BSN, [phone].  CM received referral for LTAC.  Boneta Lucks at [facility] of referral, [phone].  Awaiting determination for LTAC referral.  CSW aware as well.  CSW to explore  possible options for pt if LTAC not a possibility.  Will follow.

## 2012-03-07 NOTE — Progress Notes (Signed)
Physical Therapy Treatment Patient Details Name: Karen Barron MRN: [id number] DOB: 13-Jun-1926 Today's Date: 03/07/2012 Time: 0925-1010 PT Time Calculation (min): 45 min  PT Assessment / Plan / Recommendation Comments on Treatment Session  Pt needs much encouragement.  She has good potential to improve Pt may be a potential candidate for CIR to assist her in independence for home, but question her motivation for that.  Pt was walked on RA today and though she had fatgue and some dyspnea, sats were > 90%  she may benefit from hospital bed and [facility] aide in addition to HHPT    Follow Up Recommendations  Home health PT;Supervision/Assistance - 24 hour    Barriers to Discharge        Equipment Recommendations  None recommended by PT    Recommendations for Other Services    Frequency Min 3X/week   Plan Discharge plan remains appropriate;Frequency needs to be updated    Precautions / Restrictions Precautions Precautions: Fall Restrictions Weight Bearing Restrictions: No   Pertinent Vitals/Pain O2 sats .90% throughout despite fatigue and dyspnea on exertion    Mobility  Bed Mobility Bed Mobility: Sit to Supine Sit to Supine: 4: Min assist;HOB flat Details for Bed Mobility Assistance: needs assist to lift legs up onto bed. Pt immediately asks to have head of  bed raised Transfers Transfers: Sit to Stand;Stand to Sit Sit to Stand: 4: Min guard;With upper extremity assist;With armrests;From chair/3-in-1 Stand to Sit: 5: Supervision;With upper extremity assist;With armrests;To chair/3-in-1 Details for Transfer Assistance: Supervision and min/guard for safety with cues for hand placement.  Ambulation/Gait Ambulation/Gait Assistance: 4: Min assist Ambulation Distance (Feet): 100 Feet Assistive device: Rolling walker Ambulation/Gait Assistance Details: assist to encourage trunk extesion and bring hips under body to improve gait technique Gait Pattern: Trunk flexed;Shuffle;Decreased stride  length Gait velocity: slowed General Gait Details: Slow shuffle gait,  Stairs: No Wheelchair Mobility Wheelchair Mobility: No    Exercises Total Joint Exercises Ankle Circles/Pumps: AROM;20 reps;Both;Seated General Exercises - Lower Extremity Quad Sets: AROM;5 reps;Seated Gluteal Sets: Standing;Both;5 reps Hip Flexion/Marching: AROM;10 reps;Seated Other Exercises Other Exercises: bilateral UE hip to hip to encourage core activation  Other Exercises: issued incentive spirometry and instuced in use.  Pt only able to inspire 500 ml difficulty with understanding she needs to suck air in to expand lunds   PT Diagnosis:    PT Problem List:   PT Treatment Interventions:     PT Goals Acute Rehab PT Goals PT Goal: Sit at Edge Of Bed - Progress: Met PT Goal: Sit to Supine/Side - Progress: Progressing toward goal PT Goal: Sit to Stand - Progress: Progressing toward goal PT Goal: Stand to Sit - Progress: Progressing toward goal PT Transfer Goal: Bed to Chair/Chair to Bed - Progress: Progressing toward goal PT Goal: Stand - Progress: Progressing toward goal PT Goal: Ambulate - Progress: Progressing toward goal PT Goal: Perform Home Exercise Program - Progress: Progressing toward goal  Visit Information  Last PT Received On: 03/07/12 Assistance Needed: +1    Subjective Data  Subjective: "I'm about give out" Patient Stated Goal: husband wants pt to walk 4x a day   Cognition  Overall Cognitive Status: Appears within functional limits for tasks assessed/performed Arousal/Alertness: Awake/alert Orientation Level: Oriented X4 / Intact Behavior During Session: Flat affect Cognition - Other Comments: pt does does not talk much.  She had difficulty coordinating movements to do incentive spirometry properly.  Pt does not appear motivated to walk or exercise, though she will do whatever  is asked of her    Balance  Balance Balance Assessed: Yes Static Standing Balance Static Standing -  Balance Support: Bilateral upper extremity supported (with RW) Static Standing - Level of Assistance: 5: Stand by assistance Static Standing - Comment/# of Minutes: 2  End of Session PT - End of Session Equipment Utilized During Treatment: Gait belt Activity Tolerance: Patient limited by fatigue Patient left: in bed;with call bell/phone within reach;with family/visitor present Nurse Communication: Mobility status    [name] 03/07/2012, 10:31 AM

## 2012-03-07 NOTE — Progress Notes (Signed)
Referral received for SNF per team meeting, pt plans to D/C home. CSW will sign off, please re consult of CSW needs arise. Family declined options for placement. CSW was informed by the charge nurse that pt family may need care management services. CSW will follow up with RN CM.   Kayleen Memos. Leighton Ruff [phone]

## 2012-03-08 DIAGNOSIS — N179 Acute kidney failure, unspecified: Secondary | ICD-10-CM

## 2012-03-08 DIAGNOSIS — C9000 Multiple myeloma not having achieved remission: Secondary | ICD-10-CM

## 2012-03-08 DIAGNOSIS — I5031 Acute diastolic (congestive) heart failure: Secondary | ICD-10-CM

## 2012-03-08 DIAGNOSIS — R1013 Epigastric pain: Secondary | ICD-10-CM

## 2012-03-08 LAB — BASIC METABOLIC PANEL
BUN: 43 mg/dL — ABNORMAL HIGH (ref 6–23)
Creatinine, Ser: 1.27 mg/dL — ABNORMAL HIGH (ref 0.50–1.10)
GFR calc Af Amer: 44 mL/min — ABNORMAL LOW (ref >90)
GFR calc non Af Amer: 38 mL/min — ABNORMAL LOW (ref >90)
Glucose, Bld: 104 mg/dL — ABNORMAL HIGH (ref 70–99)
Potassium: 4.1 mEq/L (ref 3.5–5.1)

## 2012-03-08 LAB — CBC
Hemoglobin: 7.3 g/dL — ABNORMAL LOW (ref 12.0–15.0)
MCH: 29.9 pg (ref 26.0–34.0)
MCHC: 31.9 g/dL (ref 30.0–36.0)
RDW: 17.8 % — ABNORMAL HIGH (ref 11.5–15.5)

## 2012-03-08 LAB — DIFFERENTIAL
Basophils Relative: 1 % (ref 0–1)
Lymphocytes Relative: 46 % (ref 12–46)
Monocytes Absolute: 0.5 10*3/uL (ref 0.1–1.0)
Neutro Abs: 0.7 10*3/uL — ABNORMAL LOW (ref 1.7–7.7)
Neutrophils Relative %: 30 % — ABNORMAL LOW (ref 43–77)

## 2012-03-08 MED ORDER — FUROSEMIDE 10 MG/ML IJ SOLN
40.0000 mg | Freq: Once | INTRAMUSCULAR | Status: AC
  Administered 2012-03-08: 40 mg via INTRAVENOUS
  Filled 2012-03-08: qty 4

## 2012-03-08 NOTE — Progress Notes (Signed)
Physical Therapy Treatment Patient Details Name: Karen Barron MRN: [id number] DOB: 11/06/1925 Today's Date: 03/08/2012 Time: 1010-1040 PT Time Calculation (min): 30 min  PT Assessment / Plan / Recommendation Comments on Treatment Session  Some improvement today, though pt still with fatigue and requires encouragement    Follow Up Recommendations  Home health PT;Supervision/Assistance - 24 hour    Barriers to Discharge        Equipment Recommendations  None recommended by PT    Recommendations for Other Services    Frequency Min 3X/week   Plan Discharge plan remains appropriate;Frequency needs to be updated    Precautions / Restrictions Precautions Precautions: Fall Restrictions Weight Bearing Restrictions: No   Pertinent Vitals/Pain O2 sats at 96% on RA after ambulation    Mobility  Transfers Transfers: Sit to Stand;Stand to Sit Sit to Stand: 5: Supervision Stand to Sit: 5: Supervision Details for Transfer Assistance: slowed speed, but better able to perform task Ambulation/Gait Ambulation/Gait Assistance: 4: Min guard Ambulation Distance (Feet): 100 Feet Assistive device: Rolling walker Ambulation/Gait Assistance Details: cues to stand erect Gait Pattern: Trunk flexed;Shuffle;Decreased stride length Gait velocity: slowed General Gait Details: pt wlimted by fatigue and decreased Hgb today, but gait techniique a little better Stairs: No Wheelchair Mobility Wheelchair Mobility: No    Exercises General Exercises - Lower Extremity Ankle Circles/Pumps: 5 reps;Both;Seated;AROM Gluteal Sets: Seated;Standing;Both;5 reps Hip ABduction/ADduction: AROM;Other (comment);Seated (3 reps) Hip Flexion/Marching: AROM;Both;Seated (3 reps) Toe Raises: AROM;Both;Seated (3reps) Heel Raises: AROM;Seated;Other (comment) (3 reps) Other Exercises Other Exercises: bilateral UE hip to hip to encourage core activation  Other Exercises: incentive spirometry and instuced in use.  Pt only  able to inspire 500 ml difficulty with understanding she needs to suck air in to expand lunds Other Exercises: husband issued home ex program for core and leg strengthening   PT Diagnosis:    PT Problem List:   PT Treatment Interventions:     PT Goals Acute Rehab PT Goals PT Goal: Sit to Stand - Progress: Progressing toward goal PT Goal: Stand to Sit - Progress: Progressing toward goal PT Goal: Ambulate - Progress: Progressing toward goal PT Goal: Perform Home Exercise Program - Progress: Progressing toward goal  Visit Information  Last PT Received On: 03/08/12 Assistance Needed: +1    Subjective Data  Subjective: I'll try Patient Stated Goal: to go home   Cognition  Overall Cognitive Status: Appears within functional limits for tasks assessed/performed Arousal/Alertness: Awake/alert Orientation Level: Oriented X4 / Intact Behavior During Session: Flat affect Cognition - Other Comments: pt a little more talkative today, but still flat    Balance  Balance Balance Assessed: No  End of Session PT - End of Session Activity Tolerance: Patient limited by fatigue Patient left: in chair;with family/visitor present;with call bell/phone within reach Nurse Communication: Mobility status    [name] 03/08/2012, 11:01 AM

## 2012-03-08 NOTE — Progress Notes (Addendum)
Patient ID: Karen Barron, female   DOB: 1926/05/12, 78 y.o.   MRN: [id number]  Assessment and plan:   Principal Problem:   * Multiple Myeloma:  - This is refractory IgM multiple myeloma, now being treated with salvage Pomalidomide (started 02/24/2012) and Decadron  - appreciate oncology following   Active Problems:   Pancytopenia  - secondary to multiple myeloma and has had weekly blood transfusions for anemia, at least in the past one month.  - Thrombocytopenia is stable, and predated initiation of Pomalidomide.  - Anemia continues to be a persistent problem, and patient has been transfused total of 5 units PRBC from 02/28/12-03/04/12  - hemoglobin today 7.3 will transfuse 1 unit PRBC  - per oncology recommendation will transfuse platelets if less than 10   Acute on chronic diastolic heart failure  - perhaps secondary to high output heart failure due to anemia  - BNP was 5099  - cardiac enzymes are within normal limits, D-dimer negative  - 2D echocardiogram of 02/27/12, showed normal left ventricular cavity size, ejection fraction of 60% to 65% and no regional wall motion abnormalities. - Lasix to 40 mg iv b.i.d on 03/02/12-03/03/12  - at this time patient is on PO lasix   Epigastric discomfort  - This was likely secondary to GERD, and has resolved with GI cocktail, followed by PPI.   Loose Bowel movement:  - Patient had loose bowel movements at presentation  - C. Diff PCR is negative.  - diarrhea has resolved.   Biateral leg Pain:  - Likely secondary to significant edema.  - Venous duplex negative for DVT. No longer problematic, as of 02/29/12.   Acute kidney injury  - due to myeloma kidney  - baseline creatinine in March 2013 was 1.1. Creatinine on admission was 1.7.  - kidney ultrasound showed no evidence of hydronephrosis  - stable, Creatinine 1.27 today   Tinea interdigitalis:  - This was an incidental finding, affecting both feet. Per daughter, patient does tend to wears  socks all the time.  - Addressing with topical Lotrimin AF. Have encouraged to expose feet more often.   Aphthous Ulcer:  - Patient has a single lesion in the inner aspect of the left corner of her upper lip with no appreciable discomfort from this.  - No specific treatment appears indicated, at this time   Deconditioning:  - PT/OT evaluation - following   Education  - family and patient updated at bedside   Disposition  - home with home PT; anticipate D/C in next 24 or 48 hours   Consults to date:  1. Oncology  2. Cardiology   Other consultants:  1. Physical Therapy     Subjective: No events overnight.   Objective:  Vital signs in last 24 hours:  Filed Vitals:   03/07/12 1400 03/07/12 1949 03/07/12 2040 03/08/12 0450  BP: 104/46 90/40 116/72 116/62  Pulse: 87  81 86  Temp: 97 F (36.1 C)  97.8 F (36.6 C) 98.2 F (36.8 C)  TempSrc: Oral  Oral Oral  Resp: 20  17 18   Height:      Weight:    82 kg (180 lb 12.4 oz)  SpO2: 97%  96% 100%    Intake/Output from previous day:   Intake/Output Summary (Last 24 hours) at 03/08/12 1401 Last data filed at 03/08/12 1245  Gross per 24 hour  Intake    420 ml  Output      0 ml  Net  420 ml    Physical Exam: General: Alert, awake, oriented x3, in no acute distress. HEENT: No bruits, no goiter. Moist mucous membranes, no scleral icterus, no conjunctival pallor. Heart: Regular rate and rhythm, S1/S2 +, no murmurs, rubs, gallops. Lungs: Clear to auscultation bilaterally. No wheezing, no rhonchi, no rales.  Abdomen: Soft, nontender, nondistended, positive bowel sounds. Extremities: No clubbing or cyanosis, (+2) LE pitting edema,  positive pedal pulses. Neuro: Grossly nonfocal.  Lab Results:   Lab 03/08/12 0500 03/07/12 0532 03/06/12 0530 03/05/12 0500 03/04/12 0452 03/03/12 0500  WBC 2.3* 2.4* 3.2* 3.1* 3.2* --  HGB 7.3* 7.7* 8.0* 8.4* 7.2* --  HCT 22.9* 24.1* 24.7* 25.8* 21.9* --  PLT 14* 14* 13* 13* 15* --    MCV 93.9 93.8 93.9 93.1 94.8 --  MCH 29.9 30.0 30.4 30.3 31.2 --  MCHC 31.9 32.0 32.4 32.6 32.9 --  RDW 17.8* 18.0* 18.5* 18.8* 19.5* --  LYMPHSABS 1.1 0.9 -- -- -- 0.9  MONOABS 0.5 0.5 -- -- -- 0.5  EOSABS 0.0 0.0 -- -- -- 0.0  BASOSABS 0.0 0.0 -- -- -- 0.0  BANDABS -- -- -- -- -- --    Lab 03/08/12 0500 03/07/12 0532 03/06/12 0530 03/05/12 0500 03/04/12 0452  NA 132* 133* 134* 135 134*  K 4.1 4.3 4.7 4.6 4.6  CL 97 97 99 101 100  CO2 25 24 24 22 24   GLUCOSE 104* 127* 104* 120* 112*  BUN 43* 41* 44* 52* 56*  CREATININE 1.27* 1.36* 1.30* 1.43* 1.55*  CALCIUM 8.7 8.6 8.5 8.4 8.8  MG -- -- -- -- --    Medications: Scheduled Meds:   . amitriptyline  20 mg Oral QHS  . bisacodyl  10 mg Rectal Once  . clotrimazole   Topical BID  . dexamethasone  40 mg Oral Once  . docusate sodium  100 mg Oral BID  . feeding supplement  1 Container Oral TID BM  . furosemide  40 mg Oral BID  . levothyroxine  75 mcg Oral Daily  . metoprolol succinate  50 mg Oral Daily  . pantoprazole  40 mg Oral Q1200  . pomalidomide  4 mg Oral Q1500  . potassium chloride SA  20 mEq Oral BID  . simvastatin  20 mg Oral QHS  . sodium chloride  3 mL Intravenous Q12H   Continuous Infusions:  PRN Meds:.sodium chloride, acetaminophen, acetaminophen, albuterol, heparin lock flush, heparin lock flush, HYDROmorphone, ondansetron (ZOFRAN) IV, ondansetron, polyethylene glycol, sodium chloride, sodium chloride, traMADol   LOS: 11 days   Mccade Sullenberger 03/08/2012, 2:01 PM  [facility] Pager: [phone]

## 2012-03-08 NOTE — Progress Notes (Signed)
IP PROGRESS NOTE  Subjective:   She continues to feel weak. Her husband reports she ambulated in the hall 4 or 5 times yesterday. She was able to do this without supplemental oxygen. She continues to feel short of breath when she lays flat. She denies bleeding. Last bowel movement was 2 days ago.  Objective: Vital signs in last 24 hours: Blood pressure 116/62, pulse 86, temperature 98.2 F (36.8 C), temperature source Oral, resp. rate 18, height 5\' 4"  (1.626 m), weight 180 lb 12.4 oz (82 kg), SpO2 100.00%.  Intake/Output from previous day: 05/30 0701 - 05/31 0700 In: 275 [P.O.:275] Out: -   Physical Exam:  HEENT: No thrush or ulceration.  Lungs:  Inspiratory rhonchi at the left lung base. Cardiac: Regular rate and rhythm. Extremities: Trace-1+ low pretibial and ankle edema bilaterally. Tenderness at the low leg bilaterally.   Portacath/PICC-without erythema.  Lab Results:  Basename 03/08/12 0500 03/07/12 0532  WBC 2.3* 2.4*  HGB 7.3* 7.7*  HCT 22.9* 24.1*  PLT 14* 14*   ANC 1.0 BMET  Basename 03/08/12 0500 03/07/12 0532  NA 132* 133*  K 4.1 4.3  CL 97 97  CO2 25 24  GLUCOSE 104* 127*  BUN 43* 41*  CREATININE 1.27* 1.36*  CALCIUM 8.7 8.6     Studies/Results: No results found.  Medications: I have reviewed the patient's current medications.  Assessment/Plan:  1. Refractory IgM multiple myeloma-now being treated with salvage pomalidomide/Decadron. The pomalidomide was started on 02/24/2012. She completed weekly Decadron on 03/02/2012. The next dose of Decadron is due on 03/09/2012. 2. Pancytopenia secondary to multiple myeloma-status post repeat red blood cell transfusions. The severe thrombocytopenia predated the use of pomalidomide. She was transfused 2 units of blood on 03/04/2012. Thrombocytopenia is stable. 3. Chronic back pain secondary to multiple myeloma and compression fractures 4. Renal insufficiency-? secondary to multiple myeloma. The increased  creatinine during this admission was likely related to diuresis. 5. Diastolic heart failure. 6. History of a herpes zoster rash. 7. Dyspnea secondary to anemia and diastolic heart failure-persistent. 8. Hyperbilirubinemia (indirect). Bilirubin was improved on 03/04/2012. No evidence of hemolysis.   Recommendations: 1. Continue pomalidomide and weekly Decadron. 2. Check CBC in a.m. If ANC is less than 0.7 discontinue pomalidomide. 3. Transfuse 2 units of packed red blood cells. 4. Management of volume status per medicine and cardiology 5. Continue working with physical therapy. Increase ambulation as tolerated. 6. Care management consult for short-term nursing home placement versus home nursing/physical therapy. 7. Transfuse platelets for a count of less than 10,000 or bleeding  Please contact oncology over the weekend if needed. If she is discharged we will arrange for outpatient followup next week.  Plan reviewed with Dr. Truett Perna.  I saw and examined Ms. Beason. She continues to appear and feel "weak ". She is currently receiving a red cell transfusion.  The neutrophil count is lower today. I recommend discontinuing pomalidomide if the neutrophil count is lower on 03/09/2012. The weekly Decadron should be continued.  Please call oncology as needed over the weekend. I will see her on 03/11/2012. We will arrange for outpatient followup if she is discharged.    LOS: 11 days   Lonna Cobb  03/08/2012, 9:34 AM

## 2012-03-09 ENCOUNTER — Inpatient Hospital Stay (HOSPITAL_COMMUNITY): Payer: Medicare Other

## 2012-03-09 DIAGNOSIS — N179 Acute kidney failure, unspecified: Secondary | ICD-10-CM

## 2012-03-09 DIAGNOSIS — R1013 Epigastric pain: Secondary | ICD-10-CM

## 2012-03-09 DIAGNOSIS — C9000 Multiple myeloma not having achieved remission: Secondary | ICD-10-CM

## 2012-03-09 DIAGNOSIS — I5031 Acute diastolic (congestive) heart failure: Secondary | ICD-10-CM

## 2012-03-09 LAB — BASIC METABOLIC PANEL
BUN: 36 mg/dL — ABNORMAL HIGH (ref 6–23)
CO2: 25 mEq/L (ref 19–32)
Chloride: 99 mEq/L (ref 96–112)
Creatinine, Ser: 1.17 mg/dL — ABNORMAL HIGH (ref 0.50–1.10)
Glucose, Bld: 107 mg/dL — ABNORMAL HIGH (ref 70–99)

## 2012-03-09 LAB — TYPE AND SCREEN
ABO/RH(D): A POS
Antibody Screen: NEGATIVE
Unit division: 0

## 2012-03-09 LAB — DIFFERENTIAL
Basophils Absolute: 0 10*3/uL (ref 0.0–0.1)
Basophils Relative: 1 % (ref 0–1)
Eosinophils Absolute: 0.1 10*3/uL (ref 0.0–0.7)
Lymphs Abs: 1.2 10*3/uL (ref 0.7–4.0)
Neutro Abs: 0.8 10*3/uL — ABNORMAL LOW (ref 1.7–7.7)

## 2012-03-09 LAB — CBC
HCT: 28.1 % — ABNORMAL LOW (ref 36.0–46.0)
MCHC: 33.1 g/dL (ref 30.0–36.0)
MCV: 90.4 fL (ref 78.0–100.0)
RDW: 18.1 % — ABNORMAL HIGH (ref 11.5–15.5)

## 2012-03-09 MED ORDER — POMALIDOMIDE 4 MG PO CAPS
4.0000 mg | ORAL_CAPSULE | Freq: Every day | ORAL | Status: DC
  Administered 2012-03-10 – 2012-03-11 (×2): 4 mg via ORAL
  Filled 2012-03-09: qty 1

## 2012-03-09 NOTE — Progress Notes (Signed)
Patient ID: Karen Barron, female   DOB: 12-07-1925, 78 y.o.   MRN: [id number]  Assessment and plan:   Principal Problem:   * Multiple Myeloma:  - This is refractory IgM multiple myeloma, now being treated with salvage Pomalidomide (started 02/24/2012) and Decadron  - appreciate oncology following   Active Problems:   Pancytopenia  - secondary to multiple myeloma and has had weekly blood transfusions for anemia, at least in the past one month.  - Thrombocytopenia is stable, and predated initiation of Pomalidomide.  - Anemia continues to be a persistent problem, and patient has been transfused total of 5 units PRBC from 02/28/12-03/04/12  - hemoglobin today 9.3 status post 2 units PRBCs  Cough - CXR with mild interstitial edema - will continue lasix PO  Acute on chronic diastolic heart failure  - perhaps secondary to high output heart failure due to anemia  - BNP was 5099  - cardiac enzymes are within normal limits, D-dimer negative  - 2D echocardiogram of 02/27/12, showed normal left ventricular cavity size, ejection fraction of 60% to 65% and no regional wall motion abnormalities. - Lasix to 40 mg iv b.i.d on 03/02/12-03/03/12  - at this time patient is on PO lasix   Epigastric discomfort  - This was likely secondary to GERD, and has resolved with GI cocktail, followed by PPI.   Loose Bowel movement:  - Patient had loose bowel movements at presentation  - C. Diff PCR is negative.  - diarrhea has resolved.   Biateral leg Pain:  - Likely secondary to significant edema.  - Venous duplex negative for DVT. No longer problematic, as of 02/29/12.   Acute kidney injury  - due to myeloma kidney  - baseline creatinine in March 2013 was 1.1. Creatinine on admission was 1.7.  - kidney ultrasound showed no evidence of hydronephrosis  - stable, Creatinine 1.17 today   Tinea interdigitalis:  - This was an incidental finding, affecting both feet. Per daughter, patient does tend to wears  socks all the time.  - Addressing with topical Lotrimin AF. Have encouraged to expose feet more often.   Aphthous Ulcer:  - Patient has a single lesion in the inner aspect of the left corner of her upper lip with no appreciable discomfort from this.  - No specific treatment appears indicated, at this time   Deconditioning:  - PT/OT evaluation - following   Education  - family and patient updated at bedside   Disposition  - home with home PT; anticipate D/C in next 24 or 48 hours   Consults to date:  1. Oncology  2. Cardiology   Other consultants:  1. Physical Therapy    Subjective: No events overnight. Patient complained of cough.  Objective:  Vital signs in last 24 hours:  Filed Vitals:   03/08/12 1800 03/08/12 1845 03/08/12 2155 03/09/12 0419  BP: 104/45 104/50 126/58 121/71  Pulse: 88 86 82 85  Temp: 98.7 F (37.1 C) 98.6 F (37 C) 98 F (36.7 C) 97.7 F (36.5 C)  TempSrc: Oral Oral Oral Oral  Resp: 20 20 18 17   Height:      Weight:      SpO2:   97% 98%    Intake/Output from previous day:   Intake/Output Summary (Last 24 hours) at 03/09/12 1155 Last data filed at 03/09/12 0914  Gross per 24 hour  Intake 1602.5 ml  Output      5 ml  Net 1597.5 ml    Physical  Exam: General: Alert, awake, oriented x3, in no acute distress. HEENT: No bruits, no goiter. Moist mucous membranes, no scleral icterus, no conjunctival pallor. Heart: Regular rate and rhythm, S1/S2 +, no murmurs, rubs, gallops. Lungs: Clear to auscultation bilaterally. No wheezing, no rhonchi, no rales.  Abdomen: Soft, nontender, nondistended, positive bowel sounds. Extremities: No clubbing or cyanosis, (+2) LE pitting edema,  positive pedal pulses. Neuro: Grossly nonfocal.  Lab Results:   Lab 03/09/12 0500 03/08/12 0500 03/07/12 0532 03/06/12 0530 03/05/12 0500 03/03/12 0500  WBC 2.7* 2.3* 2.4* 3.2* 3.1* --  HGB 9.3* 7.3* 7.7* 8.0* 8.4* --  HCT 28.1* 22.9* 24.1* 24.7* 25.8* --  PLT 15*  14* 14* 13* 13* --  MCV 90.4 93.9 93.8 93.9 93.1 --    Lab 03/09/12 0500 03/08/12 0500 03/07/12 0532 03/06/12 0530 03/05/12 0500  NA 136 132* 133* 134* 135  K 3.8 4.1 4.3 4.7 4.6  CL 99 97 97 99 101  CO2 25 25 24 24 22   GLUCOSE 107* 104* 127* 104* 120*  BUN 36* 43* 41* 44* 52*  CREATININE 1.17* 1.27* 1.36* 1.30* 1.43*  CALCIUM 8.8 8.7 8.6 8.5 8.4  MG -- -- -- -- --    Studies/Results: Dg Chest Port 1 View 03/09/2012    IMPRESSION: Increasing interstitial prominence, possibly interstitial edema superimposed on chronic interstitial lung disease.  Mild cardiomegaly.      Medications: Scheduled Meds:   . amitriptyline  20 mg Oral QHS  . bisacodyl  10 mg Rectal Once  . clotrimazole   Topical BID  . dexamethasone  40 mg Oral Once  . docusate sodium  100 mg Oral BID  . feeding supplement  1 Container Oral TID BM  . furosemide  40 mg Intravenous Once  . furosemide  40 mg Oral BID  . levothyroxine  75 mcg Oral Daily  . metoprolol succinate  50 mg Oral Daily  . pantoprazole  40 mg Oral Q1200  . pomalidomide  4 mg Oral Q1500  . potassium chloride SA  20 mEq Oral BID  . simvastatin  20 mg Oral QHS  . sodium chloride  3 mL Intravenous Q12H   Continuous Infusions:  PRN Meds:.sodium chloride, acetaminophen, acetaminophen, albuterol, heparin lock flush, heparin lock flush, HYDROmorphone, ondansetron (ZOFRAN) IV, ondansetron, polyethylene glycol, sodium chloride, sodium chloride, traMADol    LOS: 12 days   Azarian Starace 03/09/2012, 11:55 AM  [facility] Pager: [phone]

## 2012-03-10 LAB — CBC
Hemoglobin: 8.8 g/dL — ABNORMAL LOW (ref 12.0–15.0)
MCH: 29.8 pg (ref 26.0–34.0)
MCHC: 32.6 g/dL (ref 30.0–36.0)
MCV: 91.5 fL (ref 78.0–100.0)

## 2012-03-10 LAB — DIFFERENTIAL
Basophils Relative: 1 % (ref 0–1)
Eosinophils Absolute: 0.1 10*3/uL (ref 0.0–0.7)
Eosinophils Relative: 2 % (ref 0–5)
Lymphs Abs: 1.3 10*3/uL (ref 0.7–4.0)
Monocytes Absolute: 0.6 10*3/uL (ref 0.1–1.0)
Neutrophils Relative %: 27 % — ABNORMAL LOW (ref 43–77)
WBC Morphology: INCREASED

## 2012-03-10 LAB — BASIC METABOLIC PANEL
BUN: 33 mg/dL — ABNORMAL HIGH (ref 6–23)
CO2: 25 mEq/L (ref 19–32)
Calcium: 8.9 mg/dL (ref 8.4–10.5)
GFR calc non Af Amer: 42 mL/min — ABNORMAL LOW (ref >90)
Glucose, Bld: 135 mg/dL — ABNORMAL HIGH (ref 70–99)
Potassium: 4.1 mEq/L (ref 3.5–5.1)

## 2012-03-10 NOTE — Progress Notes (Signed)
Patient ID: Karen Barron, female   DOB: 1926-07-14, 79 y.o.   MRN: [id number]  Assessment and plan:   Principal Problem:   * Multiple Myeloma:  - This is refractory IgM multiple myeloma, now being treated with salvage Pomalidomide (started 02/24/2012) and Decadron  - appreciate oncology following   Active Problems:   Pancytopenia  - secondary to multiple myeloma and has had weekly blood transfusions for anemia, at least in the past one month.  - Thrombocytopenia is stable, and predated initiation of Pomalidomide.  - Anemia continues to be a persistent problem, and patient has been transfused total of 5 units PRBC from 02/28/12-03/04/12  - hemoglobin 8.8 today status post 2 units PRBCs 03/08/2012 - patient did receive dexamethasone 03/09/2012; pomalidomide given today 03/10/2012; we will follow up with oncology if patient is to continue this treatment considering no significant improvement in WBC count  Cough  - CXR with mild interstitial edema  - will continue lasix PO   Acute on chronic diastolic heart failure  - perhaps secondary to high output heart failure due to anemia  - BNP was 5099  - cardiac enzymes are within normal limits, D-dimer negative  - 2D echocardiogram of 02/27/12, showed normal left ventricular cavity size, ejection fraction of 60% to 65% and no regional wall motion abnormalities. - Lasix to 40 mg iv b.i.d on 03/02/12-03/03/12  - at this time patient is on PO lasix   Epigastric discomfort  - This was likely secondary to GERD, and has resolved with GI cocktail, followed by PPI.   Loose Bowel movement:  - Patient had loose bowel movements at presentation  - C. Diff PCR is negative.  - diarrhea has resolved.   Biateral leg Pain:  - Likely secondary to significant edema.  - Venous duplex negative for DVT. No longer problematic, as of 02/29/12.   Acute kidney injury  - due to myeloma kidney  - baseline creatinine in March 2013 was 1.1. Creatinine on admission was 1.7.    - kidney ultrasound showed no evidence of hydronephrosis  - stable, Creatinine trending down   Tinea interdigitalis:  - This was an incidental finding, affecting both feet. Per daughter, patient does tend to wears socks all the time.  - Addressing with topical Lotrimin AF. Have encouraged to expose feet more often.   Aphthous Ulcer:  - Patient has a single lesion in the inner aspect of the left corner of her upper lip with no appreciable discomfort from this.  - No specific treatment appears indicated, at this time   Deconditioning:  - PT/OT evaluation - following  - recommendation is HHPT which is what patient prefers  Education  - family and patient updated at bedside   Disposition  - home with home PT; anticipate D/C tomorrow am  Consults to date:  1. Oncology  2. Cardiology   Other consultants:  1. Physical Therapy     Subjective: No events overnight. Patient denies chest pain, shortness of breath, abdominal pain.  Objective:  Vital signs in last 24 hours:  Filed Vitals:   03/09/12 0419 03/09/12 1442 03/09/12 2125 03/10/12 0622  BP: 121/71 114/46 106/62 110/67  Pulse: 85 80 72 79  Temp: 97.7 F (36.5 C) 97.7 F (36.5 C) 98.3 F (36.8 C) 97.5 F (36.4 C)  TempSrc: Oral Oral Oral Oral  Resp: 17 18 17 16   Height:      Weight:    74.345 kg (163 lb 14.4 oz)  SpO2: 98% 97% 96%  98%    Intake/Output from previous day:  Intake/Output Summary (Last 24 hours) at 03/10/12 1142 Last data filed at 03/10/12 0800  Gross per 24 hour  Intake   2750 ml  Output      4 ml  Net   2746 ml    Physical Exam: General: Alert, awake, oriented x3, in no acute distress. HEENT: No bruits, no goiter. Moist mucous membranes, no scleral icterus, no conjunctival pallor. Heart: Regular rate and rhythm, S1/S2 +, no murmurs, rubs, gallops. Lungs: Clear to auscultation bilaterally. No wheezing, no rhonchi, no rales.  Abdomen: Soft, nontender, nondistended, positive bowel  sounds. Extremities: No clubbing or cyanosis, (+2) LE pitting edema,  positive pedal pulses. Neuro: Grossly nonfocal.  Lab Results:  Lab 03/10/12 0520 03/09/12 0500 03/08/12 0500 03/07/12 0532 03/06/12 0530  WBC 2.7* 2.7* 2.3* 2.4* 3.2*  HGB 8.8* 9.3* 7.3* 7.7* 8.0*  HCT 27.0* 28.1* 22.9* 24.1* 24.7*  PLT 15* 15* 14* 14* 13*  MCV 91.5 90.4 93.9 93.8 93.9    Lab 03/10/12 0520 03/09/12 0500 03/08/12 0500 03/07/12 0532 03/06/12 0530  NA 136 136 132* 133* 134*  K 4.1 3.8 4.1 4.3 4.7  CL 98 99 97 97 99  CO2 25 25 25 24 24   GLUCOSE 135* 107* 104* 127* 104*  BUN 33* 36* 43* 41* 44*  CREATININE 1.16* 1.17* 1.27* 1.36* 1.30*  CALCIUM 8.9 8.8 8.7 8.6 8.5    Studies/Results: Dg Chest Port 1 View 03/09/2012    IMPRESSION: Increasing interstitial prominence, possibly interstitial edema superimposed on chronic interstitial lung disease.  Mild cardiomegaly.     Medications: Scheduled Meds:   . amitriptyline  20 mg Oral QHS  . bisacodyl  10 mg Rectal Once  . clotrimazole   Topical BID  . docusate sodium  100 mg Oral BID  . feeding supplement  1 Container Oral TID BM  . furosemide  40 mg Oral BID  . levothyroxine  75 mcg Oral Daily  . metoprolol succinate  50 mg Oral Daily  . pantoprazole  40 mg Oral Q1200  . pomalidomide  4 mg Oral Q1500  . potassium chloride SA  20 mEq Oral BID  . simvastatin  20 mg Oral QHS  . sodium chloride  3 mL Intravenous Q12H  . DISCONTD: pomalidomide  4 mg Oral Q1500   Continuous Infusions:  PRN Meds:.sodium chloride, acetaminophen, acetaminophen, albuterol, heparin lock flush, heparin lock flush, HYDROmorphone, ondansetron (ZOFRAN) IV, ondansetron, polyethylene glycol, sodium chloride, sodium chloride, traMADol   LOS: 13 days   Karen Barron 03/10/2012, 11:42 AM  [facility] Pager: [phone]

## 2012-03-11 ENCOUNTER — Other Ambulatory Visit: Payer: Self-pay | Admitting: *Deleted

## 2012-03-11 DIAGNOSIS — I5031 Acute diastolic (congestive) heart failure: Secondary | ICD-10-CM

## 2012-03-11 DIAGNOSIS — C9001 Multiple myeloma in remission: Secondary | ICD-10-CM

## 2012-03-11 DIAGNOSIS — C9000 Multiple myeloma not having achieved remission: Secondary | ICD-10-CM

## 2012-03-11 DIAGNOSIS — R1013 Epigastric pain: Secondary | ICD-10-CM

## 2012-03-11 DIAGNOSIS — N179 Acute kidney failure, unspecified: Secondary | ICD-10-CM

## 2012-03-11 LAB — BASIC METABOLIC PANEL WITH GFR
BUN: 31 mg/dL — ABNORMAL HIGH (ref 6–23)
CO2: 25 meq/L (ref 19–32)
Calcium: 8.6 mg/dL (ref 8.4–10.5)
Chloride: 98 meq/L (ref 96–112)
Creatinine, Ser: 1.14 mg/dL — ABNORMAL HIGH (ref 0.50–1.10)
GFR calc Af Amer: 50 mL/min — ABNORMAL LOW
GFR calc non Af Amer: 43 mL/min — ABNORMAL LOW
Glucose, Bld: 96 mg/dL (ref 70–99)
Potassium: 4.2 meq/L (ref 3.5–5.1)
Sodium: 136 meq/L (ref 135–145)

## 2012-03-11 LAB — CBC
HCT: 27.7 % — ABNORMAL LOW (ref 36.0–46.0)
Hemoglobin: 8.8 g/dL — ABNORMAL LOW (ref 12.0–15.0)
MCH: 29.8 pg (ref 26.0–34.0)
MCHC: 31.8 g/dL (ref 30.0–36.0)
MCV: 93.9 fL (ref 78.0–100.0)
Platelets: 15 K/uL — CL (ref 150–400)
RBC: 2.95 MIL/uL — ABNORMAL LOW (ref 3.87–5.11)
RDW: 17.3 % — ABNORMAL HIGH (ref 11.5–15.5)
WBC: 2.6 K/uL — ABNORMAL LOW (ref 4.0–10.5)

## 2012-03-11 LAB — DIFFERENTIAL
Basophils Absolute: 0 K/uL (ref 0.0–0.1)
Basophils Relative: 1 % (ref 0–1)
Eosinophils Absolute: 0.1 K/uL (ref 0.0–0.7)
Eosinophils Relative: 2 % (ref 0–5)
Lymphocytes Relative: 44 % (ref 12–46)
Lymphs Abs: 1.1 K/uL (ref 0.7–4.0)
Monocytes Absolute: 0.8 K/uL (ref 0.1–1.0)
Monocytes Relative: 29 % — ABNORMAL HIGH (ref 3–12)
Neutro Abs: 0.6 K/uL — ABNORMAL LOW (ref 1.7–7.7)
Neutrophils Relative %: 24 % — ABNORMAL LOW (ref 43–77)

## 2012-03-11 MED ORDER — ALBUTEROL SULFATE HFA 108 (90 BASE) MCG/ACT IN AERS: 2.0000 | INHALATION_SPRAY | Freq: Four times a day (QID) | RESPIRATORY_TRACT | Status: DC | PRN

## 2012-03-11 MED ORDER — BISACODYL 10 MG RE SUPP: 10.0000 mg | Freq: Once | RECTAL | Status: AC

## 2012-03-11 MED ORDER — DIAZEPAM 5 MG PO TABS
5.0000 mg | ORAL_TABLET | Freq: Two times a day (BID) | ORAL | Status: DC | PRN
  Administered 2012-03-11: 5 mg via ORAL
  Filled 2012-03-11: qty 1

## 2012-03-11 MED ORDER — ESOMEPRAZOLE MAGNESIUM 20 MG PO CPDR: 20.0000 mg | DELAYED_RELEASE_CAPSULE | Freq: Every day | ORAL | Status: DC

## 2012-03-11 MED ORDER — ONDANSETRON HCL 4 MG PO TABS: 4.0000 mg | ORAL_TABLET | Freq: Four times a day (QID) | ORAL | Status: DC | PRN

## 2012-03-11 MED ORDER — TRAMADOL HCL 50 MG PO TABS: 50.0000 mg | ORAL_TABLET | Freq: Two times a day (BID) | ORAL | Status: AC | PRN

## 2012-03-11 MED ORDER — LEVOTHYROXINE SODIUM 75 MCG PO TABS: 75.0000 ug | ORAL_TABLET | Freq: Every day | ORAL | Status: DC

## 2012-03-11 MED ORDER — DIAZEPAM 5 MG PO TABS: 5.0000 mg | ORAL_TABLET | Freq: Two times a day (BID) | ORAL | Status: AC | PRN

## 2012-03-11 MED ORDER — METOPROLOL SUCCINATE ER 50 MG PO TB24: 50.0000 mg | ORAL_TABLET | Freq: Every day | ORAL | Status: DC

## 2012-03-11 MED ORDER — HEPARIN SOD (PORK) LOCK FLUSH 100 UNIT/ML IV SOLN
INTRAVENOUS | Status: AC
  Administered 2012-03-11: 18:00:00
  Filled 2012-03-11: qty 5

## 2012-03-11 MED ORDER — AMITRIPTYLINE HCL 10 MG PO TABS: 20.0000 mg | ORAL_TABLET | Freq: Every day | ORAL | Status: DC

## 2012-03-11 MED ORDER — SIMVASTATIN 20 MG PO TABS: 20.0000 mg | ORAL_TABLET | Freq: Every day | ORAL | Status: DC

## 2012-03-11 MED ORDER — FUROSEMIDE 40 MG PO TABS: 40.0000 mg | ORAL_TABLET | Freq: Two times a day (BID) | ORAL | Status: DC

## 2012-03-11 NOTE — Progress Notes (Signed)
Patient and husband received discharge instructions and both verbalized understanding without further questions. Patient PAC flushed with 10cc normal saline and 5cc Heparin prior to being deaccessed. Patient follow up appts discussed, prescriptions given and belongings packed. Patient to be taken downstairs via wheelchair to be discharged home.

## 2012-03-11 NOTE — Progress Notes (Signed)
Physical Therapy Treatment Patient Details Name: Karen Barron MRN: [id number] DOB: 1926-05-26 Today's Date: 03/11/2012 Time: 1478-2956 PT Time Calculation (min): 22 min  PT Assessment / Plan / Recommendation Comments on Treatment Session  progressing    Follow Up Recommendations  Home health PT;Supervision/Assistance - 24 hour    Barriers to Discharge        Equipment Recommendations  None recommended by PT    Recommendations for Other Services    Frequency Min 3X/week   Plan Discharge plan remains appropriate;Frequency needs to be updated    Precautions / Restrictions Precautions Precautions: Fall   Pertinent Vitals/Pain    Mobility  Bed Mobility Bed Mobility: Not assessed Transfers Transfers: Sit to Stand;Stand to Sit Sit to Stand: 5: Supervision;With armrests;From chair/3-in-1 Stand to Sit: 5: Supervision;With armrests;To chair/3-in-1 Details for Transfer Assistance: cues for hand placement Ambulation/Gait Ambulation/Gait Assistance: 4: Min guard Ambulation Distance (Feet): 60 Feet Assistive device: Rolling walker Ambulation/Gait Assistance Details: verbal cues occasionally for posture and RW distance from self Gait Pattern: Trunk flexed;Shuffle;Decreased stride length General Gait Details: increased gait speed today    Exercises General Exercises - Lower Extremity Ankle Circles/Pumps: 10 reps;AROM;Both Long Arc Quad: AROM;Both;10 reps   PT Diagnosis:    PT Problem List:   PT Treatment Interventions:     PT Goals Acute Rehab PT Goals Time For Goal Achievement: 03/12/12 Potential to Achieve Goals: Good Pt will go Sit to Stand: with modified independence PT Goal: Sit to Stand - Progress: Progressing toward goal Pt will go Stand to Sit: with modified independence PT Goal: Stand to Sit - Progress: Progressing toward goal Pt will Ambulate: 51 - 150 feet;with modified independence;with least restrictive assistive device PT Goal: Ambulate - Progress:  Progressing toward goal  Visit Information  Last PT Received On: 03/11/12 Assistance Needed: +1    Subjective Data  Subjective: I'll try Patient Stated Goal: to go home   Cognition  Overall Cognitive Status: Appears within functional limits for tasks assessed/performed Arousal/Alertness: Awake/alert Orientation Level: Oriented X4 / Intact Behavior During Session: Flat affect    Balance     End of Session PT - End of Session Equipment Utilized During Treatment: Gait belt Activity Tolerance: Patient tolerated treatment well Patient left: in chair;with family/visitor present;with call bell/phone within reach    [facility] 03/11/2012, 4:54 PM

## 2012-03-11 NOTE — Discharge Summary (Signed)
Patient ID: LARESSA BOLINGER MRN: [id number] DOB/AGE: [birth year] 78 y.o.  Admit date: 02/26/2012 Discharge date: 03/11/2012  Primary Care Physician:  Marga Melnick, MD, MD  Assessment and plan:   Principal Problem:   * Multiple Myeloma:  - This is refractory IgM multiple myeloma, now being treated with salvage Pomalidomide (started 02/24/2012) and Decadron  - appreciate oncology following while patient in hospital  Active Problems:   Pancytopenia  - secondary to multiple myeloma and has had weekly blood transfusions for anemia, at least in the past one month.  - Thrombocytopenia is stable, and predated initiation of Pomalidomide.  - Anemia continues to be a persistent problem, and patient has been transfused total of 5 units PRBC from 02/28/12-03/04/12  - hemoglobin 8.8 today status post 2 units PRBCs 03/08/2012  - patient did receive dexamethasone 03/09/2012; pomalidomide given  03/10/2012 and may be continued on discharge per home medication regimen  Cough  - CXR with mild interstitial edema  - will continue lasix PO   Acute on chronic diastolic heart failure  - perhaps secondary to high output heart failure due to anemia  - BNP was 5099  - cardiac enzymes are within normal limits, D-dimer negative  - 2D echocardiogram of 02/27/12, showed normal left ventricular cavity size, ejection fraction of 60% to 65% and no regional wall motion abnormalities. - Lasix to 40 mg iv b.i.d on 03/02/12-03/03/12  - at this time patient is on PO lasix   Epigastric discomfort  - This was likely secondary to GERD, and has resolved with GI cocktail, followed by PPI.   Loose Bowel movement:  - Patient had loose bowel movements at presentation  - C. Diff PCR is negative.  - diarrhea has resolved.   Biateral leg Pain:  - Likely secondary to significant edema.  - Venous duplex negative for DVT. No longer problematic, as of 02/29/12.   Acute kidney injury  - due to myeloma kidney  - baseline creatinine  in March 2013 was 1.1. Creatinine on admission was 1.7.  - kidney ultrasound showed no evidence of hydronephrosis  - stable, Creatinine trending down   Tinea interdigitalis:  - This was an incidental finding, affecting both feet. Per daughter, patient does tend to wears socks all the time.  - Addressing with topical Lotrimin AF. Have encouraged to expose feet more often.   Aphthous Ulcer:  - Patient has a single lesion in the inner aspect of the left corner of her upper lip with no appreciable discomfort from this.  - No specific treatment appears indicated, at this time   Deconditioning:  - PT/OT evaluation - following  - recommendation is HHPT which is what patient prefers   Education  - family and patient updated at bedside   Disposition  - home with home PT  Consults to date:  1. Oncology  2. Cardiology   Other consultants:  1. Physical Therapy     Medication List  As of 03/11/2012  2:38 PM   TAKE these medications         albuterol 108 (90 BASE) MCG/ACT inhaler   Commonly known as: PROVENTIL HFA;VENTOLIN HFA   Inhale 2 puffs into the lungs every 6 (six) hours as needed. For shortness of breath      amitriptyline 10 MG tablet   Commonly known as: ELAVIL   Take 2 tablets (20 mg total) by mouth at bedtime.      bisacodyl 10 MG suppository   Commonly known as: DULCOLAX  Place 1 suppository (10 mg total) rectally once.      dexamethasone 4 MG tablet   Commonly known as: DECADRON   Take 20 mg by mouth every 30 (thirty) days.      diazepam 5 MG tablet   Commonly known as: VALIUM   Take 1 tablet (5 mg total) by mouth every 12 (twelve) hours as needed for anxiety.      esomeprazole 20 MG capsule   Commonly known as: NEXIUM   Take 1 capsule (20 mg total) by mouth daily before breakfast.      furosemide 40 MG tablet   Commonly known as: LASIX   Take 1 tablet (40 mg total) by mouth 2 (two) times daily.      levothyroxine 75 MCG tablet   Commonly known as:  SYNTHROID, LEVOTHROID   Take 1 tablet (75 mcg total) by mouth daily.      metoprolol succinate 50 MG 24 hr tablet   Commonly known as: TOPROL-XL   Take 1 tablet (50 mg total) by mouth daily. Take with or immediately following a meal.      ondansetron 4 MG tablet   Commonly known as: ZOFRAN   Take 1 tablet (4 mg total) by mouth every 6 (six) hours as needed for nausea.      pomalidomide 4 MG capsule   Commonly known as: POMALYST   Take 4 mg by mouth See admin instructions. 4mg  daily for 21 days; then 7 days off   (Last course began on 02-24-12)      potassium chloride SA 20 MEQ tablet   Commonly known as: K-DUR,KLOR-CON   Take 20 mEq by mouth 2 (two) times daily.      prochlorperazine 10 MG tablet   Commonly known as: COMPAZINE   Take 10 mg by mouth every 6 (six) hours as needed. For nausea      simvastatin 20 MG tablet   Commonly known as: ZOCOR   Take 1 tablet (20 mg total) by mouth at bedtime.      traMADol 50 MG tablet   Commonly known as: ULTRAM   Take 1 tablet (50 mg total) by mouth every 12 (twelve) hours as needed.             Significant Diagnostic Studies:  Dg Chest Portable 1 View 02/26/2012    IMPRESSION: No active cardiopulmonary disease.     Brief H and P: 78 year old female with h/o MM on chemotherapy, Diastolic Heart Failure, presented with worsening shortness and abdominal pain associated with nausea and vomiting, nonbloody. Patient reported pain was about 7/10 in intensity, nonradiating and not relieved with analgesics at home. Shortness of breath was accompanied with cough although nonproductive without associated fever or chills. No complaints of diarrhea or constipation the patient did report loose bowel movements 2 days prior to the admission. No complaints of chest pain, palpitations. Patient does have a history of anemia and required transfusions.   Physical Exam on Discharge:  Filed Vitals:   03/10/12 1445 03/10/12 2105 03/11/12 0439 03/11/12  1335  BP: 103/60 105/64 115/62 115/68  Pulse: 87 79 77 83  Temp: 97.7 F (36.5 C) 98.4 F (36.9 C) 98.1 F (36.7 C) 97.5 F (36.4 C)  TempSrc: Oral Oral Oral Oral  Resp: 20 16 16 18   Height:      Weight:      SpO2: 97% 98% 99% 98%     Intake/Output Summary (Last 24 hours) at 03/11/12 1438 Last data  filed at 03/11/12 1247  Gross per 24 hour  Intake   1100 ml  Output      0 ml  Net   1100 ml    General: Alert, awake, oriented x3, in no acute distress. HEENT: No bruits, no goiter. Heart: Regular rate and rhythm, without murmurs, rubs, gallops. Lungs: Clear to auscultation bilaterally. Abdomen: Soft, nontender, nondistended, positive bowel sounds. Extremities: No clubbing cyanosis or edema with positive pedal pulses. Neuro: Grossly intact, nonfocal.  CBC:    Component Value Date/Time   WBC 2.6* 03/11/2012 0443   WBC 2.5* 02/22/2012 1037   HGB 8.8* 03/11/2012 0443   HGB 7.4* 02/22/2012 1037   HCT 27.7* 03/11/2012 0443   HCT 23.0* 02/22/2012 1037   PLT 15* 03/11/2012 0443   PLT 23* 02/22/2012 1037   MCV 93.9 03/11/2012 0443   MCV 99.1 02/22/2012 1037   NEUTROABS 0.6* 03/11/2012 0443   NEUTROABS 0.8* 02/22/2012 1037   LYMPHSABS 1.1 03/11/2012 0443   LYMPHSABS 1.3 02/22/2012 1037   MONOABS 0.8 03/11/2012 0443   MONOABS 0.4 02/22/2012 1037   EOSABS 0.1 03/11/2012 0443   EOSABS 0.0 02/22/2012 1037   BASOSABS 0.0 03/11/2012 0443   BASOSABS 0.0 02/22/2012 1037    Basic Metabolic Panel:    Component Value Date/Time   NA 136 03/11/2012 0443   NA 136 01/22/2012 1118   K 4.2 03/11/2012 0443   K 3.5 01/22/2012 1118   CL 98 03/11/2012 0443   CL 98 01/22/2012 1118   CO2 25 03/11/2012 0443   CO2 28 01/22/2012 1118   BUN 31* 03/11/2012 0443   BUN 15 01/22/2012 1118   CREATININE 1.14* 03/11/2012 0443   CREATININE 1.1 01/22/2012 1118   CREATININE 1.08 10/18/2006 1023   GLUCOSE 96 03/11/2012 0443   GLUCOSE 96 01/22/2012 1118   CALCIUM 8.6 03/11/2012 0443   CALCIUM 8.3 01/22/2012 1118    Time spent on Discharge: 45  minutes  Signed: Elisabeth Pigeon, Shalie Schremp 03/11/2012, 2:38 PM

## 2012-03-11 NOTE — Discharge Instructions (Signed)
Multiple Myeloma Multiple myeloma is the most common cancer of bone. It is caused by the uncontrolled multiplication of a type of white blood cell in the marrow. This white blood cell is called a plasma cell. This means the bone marrow is overworking producing plasma cells. Soon these overproduced cells begin to take up room in the marrow that is needed by other cells. This means that there are soon not enough red or white blood cells or platelets. Not enough red cells mean that the person is anemic. There are not enough red blood cells to carry oxygen around the body. There are not enough white blood cells to fight disease. This causes the person with multiple myeloma to not feel well. There is also bone pain through much of the body. SYMPTOMS  Anemia causes fatigue (tiredness) and weakness.   Back pain is common. This is from fractures (break in bones) caused by damage to the bones of the back.   Lack of white blood cells makes infection more likely.   Bleeding is a common problem from lack of the cells (platelets). Platelets help blood clots form. This may show up as bleeding from any place. Commonly this shows up as bleeding from the nose or gums.   Fractures (bone breaks) are more common anywhere. The back and ribs are the most commonly fractured areas.  DIAGNOSIS  This tumor is often suggested by blood tests. Often doing a bone marrow sample makes the diagnosis (learning what is wrong). This is a test performed by taking a small sample of bone with a small needle. This bone often comes from the sternum (breast bone). This sample is sent to a pathologist (a specialist in looking at tissue under a microscope). After looking at the sample under the microscope, the pathologist is able to make a diagnosis of the problem. X-rays may also show boney changes. TREATMENT   Occasionally, anti-cancer medications may be used with multiple myeloma. Your caregiver can discuss this with you.   Medications  can also be given to help with the bone pain.   There is no cure for multiple myeloma. Lifestyle changes can add years of quality living.  HOME CARE INSTRUCTIONS  Often there is no specific treatment for multiple myeloma. Most of the treatment consists of adjustments in dietary and living activities. Some of these changes include:  Your dietitian or caregiver helping you with your dietary questions.   Taking iron and vitamins as prescribed by your caregiver.   Eating a well balanced diet.   Staying active, but follow restrictions suggested by your caregiver. Avoiding heavy lifting (more than 10 pounds) and activities that cause increased pain.   Drinking plenty of water.   Using back braces and a cane may help with some of the boney pain.  SEEK IMMEDIATE MEDICAL CARE IF:  You develop severe, uncontrolled boney pain.   You or your family notices confusion, problems with decision-making or inability to stay awake.   You notice increased urination or constipation.   You notice problems holding your water or stool.   You have numbness or loss of control of your extremities (arms/hands or legs/feet).  Document Released: 06/20/2001 Document Revised: 09/14/2011 Document Reviewed: 09/20/2008 [employer] Patient Information 2012 [employer]. 

## 2012-03-11 NOTE — Progress Notes (Signed)
OT Note:  Pt up in chair and does not feel up to OT.  Her husband had assisted her to the bathroom earlier.  Noted that pt may be discharging tomorrow:  Checked with them for needs and they feel like they will be OK.  [name], [place] [phone] 03/11/2012

## 2012-03-11 NOTE — Progress Notes (Signed)
IP PROGRESS NOTE  Subjective:   She complains of "weakness ". No bleeding or shortness of breath.  Objective: Vital signs in last 24 hours: Blood pressure 115/62, pulse 77, temperature 98.1 F (36.7 C), temperature source Oral, resp. rate 16, height 5\' 4"  (1.626 m), weight 163 lb 14.4 oz (74.345 kg), SpO2 99.00%.  Intake/Output from previous day: 06/02 0701 - 06/03 0700 In: 740 [P.O.:720; I.V.:20] Out: -   Physical Exam:  HEENT: No thrush or ulceration. A few petechiae at the palate. No buccal ecchymoses. Lungs:  Good air movement bilaterally, and a few scattered and inspiratory rhonchi, no respiratory distress Cardiac: Regular rate and rhythm. Extremities: Trace-1+ low pretibial and ankle edema bilaterally. Tenderness at the low leg bilaterally.   Portacath/PICC-without erythema.  Lab Results:  Basename 03/11/12 0443 03/10/12 0520  WBC 2.6* 2.7*  HGB 8.8* 8.8*  HCT 27.7* 27.0*  PLT 15* 15*   ANC 0.6 ANC 1.0 BMET  Basename 03/11/12 0443 03/10/12 0520  NA 136 136  K 4.2 4.1  CL 98 98  CO2 25 25  GLUCOSE 96 135*  BUN 31* 33*  CREATININE 1.14* 1.16*  CALCIUM 8.6 8.9     Studies/Results: No results found.  Medications: I have reviewed the patient's current medications.  Assessment/Plan:  1. Refractory IgM multiple myeloma-now being treated with salvage pomalidomide/Decadron. The pomalidomide was started on 02/24/2012. She completed weekly Decadron on 03/02/2012. The next dose of Decadron is due on 03/16/2012. 2. Pancytopenia secondary to multiple myeloma-status post repeat red blood cell transfusions. The severe thrombocytopenia predated the use of pomalidomide. She was transfused 2 units of blood on 03/08/2012. Thrombocytopenia is stable. 3. Chronic back pain secondary to multiple myeloma and compression fractures 4. Renal insufficiency-? secondary to multiple myeloma. The increased creatinine during this admission was likely related to diuresis. 5. Diastolic  heart failure. 6. History of a herpes zoster rash. 7. Dyspnea secondary to anemia and diastolic heart failure-persistent. 8. Hyperbilirubinemia (indirect). Bilirubin was improved on 03/04/2012. No evidence of hemolysis.   Recommendations: 1. Continue pomalidomide and weekly Decadron. The necks Decadron is due on 03/16/2012. She will complete the current cycle of pomalidomide on 03/15/2012 2. she appears okay for discharge from an oncology standpoint. 3. Home physical therapy  Mr. Mahn appears unchanged. She will continue pomalidomide and weekly Decadron. We will arrange for outpatient CBC on 03/14/2012. She will be scheduled for office visit during the week of 03/18/2012.  Her overall prognosis is poor. I will discuss a hospice referral with the patient and her family if her performance status does not improve over the next few weeks.   LOS: 14 days   Sabrena Gavitt  03/11/2012, 1:34 PM

## 2012-03-12 ENCOUNTER — Telehealth: Payer: Self-pay | Admitting: Oncology

## 2012-03-12 ENCOUNTER — Other Ambulatory Visit: Payer: Self-pay | Admitting: *Deleted

## 2012-03-12 LAB — CULTURE, RESPIRATORY W GRAM STAIN

## 2012-03-12 NOTE — Telephone Encounter (Signed)
called pts s/w daughter kim and scheduled appts for 06/05-06/10

## 2012-03-13 ENCOUNTER — Other Ambulatory Visit (HOSPITAL_BASED_OUTPATIENT_CLINIC_OR_DEPARTMENT_OTHER): Payer: Medicare Other

## 2012-03-13 ENCOUNTER — Telehealth: Payer: Self-pay | Admitting: *Deleted

## 2012-03-13 DIAGNOSIS — C9001 Multiple myeloma in remission: Secondary | ICD-10-CM

## 2012-03-13 LAB — CBC WITH DIFFERENTIAL/PLATELET
Basophils Absolute: 0 10*3/uL (ref 0.0–0.1)
Eosinophils Absolute: 0 10*3/uL (ref 0.0–0.5)
HGB: 8.7 g/dL — ABNORMAL LOW (ref 11.6–15.9)
LYMPH%: 53.1 % — ABNORMAL HIGH (ref 14.0–49.7)
MCV: 92.6 fL (ref 79.5–101.0)
MONO#: 0.5 10*3/uL (ref 0.1–0.9)
MONO%: 23.9 % — ABNORMAL HIGH (ref 0.0–14.0)
NEUT#: 0.4 10*3/uL — CL (ref 1.5–6.5)
Platelets: 16 10*3/uL — ABNORMAL LOW (ref 145–400)
RDW: 16.7 % — ABNORMAL HIGH (ref 11.2–14.5)

## 2012-03-13 LAB — TECHNOLOGIST REVIEW

## 2012-03-13 NOTE — Telephone Encounter (Signed)
MD reviewed CBC results. Stable. Continue pomalidomide and decadron. Return 6/7 for recheck of CBC. Patient reports she has some mild nose bleeding when she blows. Instructed her to keep nasal passages moist with neosporin ointment and saline nasal spray. Suggested she not blow her nose with platelets at current level. Reinforced this to her husband also.

## 2012-03-15 ENCOUNTER — Encounter: Payer: Self-pay | Admitting: *Deleted

## 2012-03-15 ENCOUNTER — Telehealth: Payer: Self-pay | Admitting: *Deleted

## 2012-03-15 ENCOUNTER — Other Ambulatory Visit (HOSPITAL_BASED_OUTPATIENT_CLINIC_OR_DEPARTMENT_OTHER): Payer: Medicare Other | Admitting: Lab

## 2012-03-15 DIAGNOSIS — C9001 Multiple myeloma in remission: Secondary | ICD-10-CM

## 2012-03-15 LAB — CBC WITH DIFFERENTIAL/PLATELET
Basophils Absolute: 0 10*3/uL (ref 0.0–0.1)
EOS%: 1.6 % (ref 0.0–7.0)
Eosinophils Absolute: 0 10*3/uL (ref 0.0–0.5)
HCT: 28.3 % — ABNORMAL LOW (ref 34.8–46.6)
HGB: 9.1 g/dL — ABNORMAL LOW (ref 11.6–15.9)
MCH: 29.5 pg (ref 25.1–34.0)
NEUT#: 0.4 10*3/uL — CL (ref 1.5–6.5)
NEUT%: 15.8 % — ABNORMAL LOW (ref 38.4–76.8)
lymph#: 1.4 10*3/uL (ref 0.9–3.3)

## 2012-03-15 LAB — TECHNOLOGIST REVIEW

## 2012-03-15 LAB — HOLD TUBE, BLOOD BANK

## 2012-03-15 MED ORDER — BENZONATATE 200 MG PO CAPS: 200.0000 mg | ORAL_CAPSULE | Freq: Three times a day (TID) | ORAL | Status: DC | PRN

## 2012-03-15 MED ORDER — CLARITHROMYCIN 500 MG PO TABS: 500.0000 mg | ORAL_TABLET | Freq: Two times a day (BID) | ORAL | Status: DC

## 2012-03-15 NOTE — Telephone Encounter (Signed)
Husband requested RN listen to patient lungs today. Persistent, productive cough of green sputum. Sinus congestion and drainage reported. No fever or increase in dyspnea from her baseline. Upper lung fields are clear, except for occasional expiratory wheeze. Faint crackles in bases, that partially clear after coughing. Discussed with Dr. Cyndie Chime. Called in Biaxin 500 mg bid X 10 days and renewed an expired script she had for benzonatate 200 mg tid prn cough. Instructed her to push po fluids, call for fever or dyspnea. Use her inhaler as needed. She will continue her pomalidomide and decadron regimen and return to office for visit on 03/18/12. Counts today are improved--will not require transfusion.

## 2012-03-18 ENCOUNTER — Ambulatory Visit (HOSPITAL_BASED_OUTPATIENT_CLINIC_OR_DEPARTMENT_OTHER): Payer: Medicare Other | Admitting: Nurse Practitioner

## 2012-03-18 ENCOUNTER — Other Ambulatory Visit: Payer: Self-pay | Admitting: *Deleted

## 2012-03-18 ENCOUNTER — Other Ambulatory Visit (HOSPITAL_BASED_OUTPATIENT_CLINIC_OR_DEPARTMENT_OTHER): Payer: Medicare Other | Admitting: Lab

## 2012-03-18 VITALS — BP 107/57 | HR 83 | Temp 96.7°F | Ht 64.0 in | Wt 158.1 lb

## 2012-03-18 DIAGNOSIS — D696 Thrombocytopenia, unspecified: Secondary | ICD-10-CM

## 2012-03-18 DIAGNOSIS — D649 Anemia, unspecified: Secondary | ICD-10-CM

## 2012-03-18 DIAGNOSIS — C9000 Multiple myeloma not having achieved remission: Secondary | ICD-10-CM

## 2012-03-18 DIAGNOSIS — C9001 Multiple myeloma in remission: Secondary | ICD-10-CM

## 2012-03-18 DIAGNOSIS — C9002 Multiple myeloma in relapse: Secondary | ICD-10-CM | POA: Insufficient documentation

## 2012-03-18 DIAGNOSIS — I5032 Chronic diastolic (congestive) heart failure: Secondary | ICD-10-CM

## 2012-03-18 LAB — CBC WITH DIFFERENTIAL/PLATELET
Basophils Absolute: 0 10*3/uL (ref 0.0–0.1)
Eosinophils Absolute: 0 10*3/uL (ref 0.0–0.5)
HCT: 24.9 % — ABNORMAL LOW (ref 34.8–46.6)
HGB: 8.3 g/dL — ABNORMAL LOW (ref 11.6–15.9)
MCH: 30.3 pg (ref 25.1–34.0)
MONO#: 0.7 10*3/uL (ref 0.1–0.9)
NEUT#: 1.1 10*3/uL — ABNORMAL LOW (ref 1.5–6.5)
NEUT%: 38.8 % (ref 38.4–76.8)
RDW: 16.4 % — ABNORMAL HIGH (ref 11.2–14.5)
WBC: 2.9 10*3/uL — ABNORMAL LOW (ref 3.9–10.3)
lymph#: 1.1 10*3/uL (ref 0.9–3.3)

## 2012-03-18 MED ORDER — DEXAMETHASONE 4 MG PO TABS: 40.0000 mg | ORAL_TABLET | ORAL | Status: DC

## 2012-03-18 NOTE — Telephone Encounter (Signed)
Gave pt appt date for June 2013 lab and MD

## 2012-03-18 NOTE — Progress Notes (Signed)
OFFICE PROGRESS NOTE  Interval history: Karen Barron returns as scheduled. She was hospitalized 02/26/2012 Karen through 03/11/2012 with acute on chronic diastolic heart failure felt to possibly be secondary to high output heart failure due to anemia. She was diuresed. Pomalidomide Karen dexamethasone were continued during hospitalization.  Karen Barron is feeling somewhat stronger. She continues working with physical therapy. Her appetite is better. She continues to have a cough. She continues to note leg swelling. No bleeding aside from a slight nosebleed.   Objective: Blood pressure 107/57, pulse 83, temperature 96.7 F (35.9 C), temperature source Oral, height 5\' 4"  (1.626 m), weight 158 lb 1.6 oz (71.714 kg).  Oropharynx is without thrush or ulceration. Lungs are clear. Regular cardiac rhythm. Port-A-Cath site is without erythema. Abdomen is soft Karen nontender. Pitting edema at the lower legs bilaterally.  Lab Results: Lab Results  Component Value Date   WBC 2.9* 03/18/2012   HGB 8.3* 03/18/2012   HCT 24.9* 03/18/2012   MCV 91.4 03/18/2012   PLT 28* 03/18/2012    Chemistry:    Chemistry      Component Value Date/Time   NA 136 03/11/2012 0443   NA 136 01/22/2012 1118   K 4.2 03/11/2012 0443   K 3.5 01/22/2012 1118   CL 98 03/11/2012 0443   CL 98 01/22/2012 1118   CO2 25 03/11/2012 0443   CO2 28 01/22/2012 1118   BUN 31* 03/11/2012 0443   BUN 15 01/22/2012 1118   CREATININE 1.14* 03/11/2012 0443   CREATININE 1.1 01/22/2012 1118   CREATININE 1.08 10/18/2006 1023      Component Value Date/Time   CALCIUM 8.6 03/11/2012 0443   CALCIUM 8.3 01/22/2012 1118   ALKPHOS 41 03/04/2012 1055   AST 17 03/04/2012 1055   ALT 44* 03/04/2012 1055   BILITOT 2.1* 03/04/2012 1055       Studies/Results: US Renal  02/28/2012   *RADIOLOGY REPORT*  Clinical Data: Renal failure.  RENAL/URINARY TRACT ULTRASOUND COMPLETE  Comparison:  None  Findings:  Right Kidney:  10.5 cm in length.  Age related renal cortical thinning but  overall normal echogenicity.  No focal lesions or hydronephrosis.  No renal calculi.  Left Kidney:  9.9 cm in length.  Renal cortical thinning but normal echogenicity.  No hydronephrosis, renal mass or calculi.  Bladder:  Normal  IMPRESSION: Age related renal cortical thinning but no renal mass lesion or hydronephrosis.  Original Report Authenticated By: P. Loralie Champagne, M.D.   Dg Chest Port 1 View  03/09/2012  *RADIOLOGY REPORT*  Clinical Data: Cough, history of multiple myeloma.  History of asthma, CHF.  PORTABLE CHEST - 1 VIEW  Comparison: 02/25/2012  Findings: Right Port-A-Cath remains in place, unchanged.  Diffuse interstitial prominence throughout the lungs, slightly progressed since prior study raise the possibility of interstitial edema. Suspect a component of chronic interstitial lung disease.  Mild cardiomegaly.  No effusions.  Changes of left shoulder replacement.  Sclerotic focus in the right humeral head, likely infarct or enchondroma, unchanged.  No acute bony abnormality.  IMPRESSION: Increasing interstitial prominence, possibly interstitial edema superimposed on chronic interstitial lung disease.  Mild cardiomegaly.  Original Report Authenticated By: Cyndie Chime, M.D.   Dg Chest Portable 1 View  02/26/2012  *RADIOLOGY REPORT*  Clinical Data: Chest pain.  Short of breath.  PORTABLE CHEST - 1 VIEW  Comparison: 12/19/2011  Findings: Right internal jugular vein Port-A-Cath stable.  No pneumothorax.  Normal heart size.  Low lung volumes with mild basilar  atelectasis.  No definite interstitial edema.  IMPRESSION: No active cardiopulmonary disease.  Original Report Authenticated By: Donavan Burnet, M.D.    Medications: I have reviewed the patient's current medications.  Assessment/Plan:  1. Multiple myeloma treated with subcutaneous Velcade Karen Cytoxan/Decadron on a weekly schedule beginning in May 2011. She completed 1 year of treatment on 01/24/2011. The serum M-spike Karen IgM level were  increased beginning in May 2012. She began treatment with carfilzomib on 05/25/2011. She completed 2 cycles. The serum M-spike Karen IgM level were higher on 07/12/2011. She began cycle 1 melphalan/prednisone 07/25/2011. The IgM level was improved on 08/21/2011. She began cycle 2 melphalan/prednisone on 08/29/2011. The melphalan dose was reduced to 8 mg daily for 4 days beginning with cycle 2. The melphalan was dose reduced to 6 mg daily for 4 days beginning with the third cycle of melphalan/prednisone on 09/27/2011. She completed a fourth cycle of melphalan/prednisone beginning on 10/25/2011. The IgM level was stable on 11/15/2011. She completed cycle 6 melphalan/prednisone beginning 12/29/2011. The IgM level was increased on 01/22/2012. She began Pomalidomide on 02/26/2012 with weekly dexamethasone 2. Hospitalization with shortness of breath/acute diastolic heart failure 02/26/2012 through 03/11/2012. 3. Hospitalization with dyspnea/hypoxia secondary to volume overload in the setting of diastolic heart failure on 05/26/2011. 4. Hospitalization 06/02/2011 with dyspnea. She was found to have marked anemia with a hemoglobin of 6.7. She received a red cell transfusion 09/27/2011. 5. History of congestive heart failure status post hospital admission 05/26/2011. 6. Thrombocytopenia secondary to multiple myeloma. Stable. 7. T7 compression fracture status post kyphoplasty 11/23/2007 by Dr. Noel Gerold. 8. Peripheral neuropathy status post evaluation by Dr. Anne Hahn. The neuropathy is most likely related to Velcade therapy. 9. Herpes zoster at the left abdomen Karen chest wall status post Valtrex therapy. 10. Postherpetic neuralgia. 11. History of neutropenia secondary to Velcade Karen Cytoxan. There is persistent neutropenia, likely related to multiple myeloma. 12. Pain Karen swelling of the proximal interphalangeal joint of the right 3rd finger when she was here on 02/21/2011, improved. She was treated with a Medrol  Dosepak. 13. Cough, wheezing, dyspnea, Karen pleuritic right-sided chest pain Karen here on 01/22/2012. Chest CT showed right upper lobe pneumonia. She completed a 10 day course of Avelox on 01/31/2012. 14. Persistent severe anemia. She was transfused during the recent hospitalization. 15. Left wrist pain/tenderness, erythema Karen edema-02/02/2012,? Gout versus a joint infection. Improved after Keflex Karen a Medrol Dosepak. 16. Pain at the anterior chest when supine Karen with exertion-? Reflux,? Angina equivalent,? Pain related to myeloma involving the spine or chest. She did not complain of pain at today's visit.  Disposition-Karen Barron will return for a followup CBC on 03/22/2012 with plans to begin cycle 2 Pomalidomide on 03/23/2012 with continuation of dexamethasone 40 mg weekly if counts are adequate. She will return for a followup visit on 03/29/2012. She will contact the office in the interim with any problems.  Plan reviewed with Dr. Truett Perna.  Lonna Cobb ANP/GNP-BC

## 2012-03-19 ENCOUNTER — Encounter: Payer: Self-pay | Admitting: Internal Medicine

## 2012-03-19 ENCOUNTER — Ambulatory Visit (INDEPENDENT_AMBULATORY_CARE_PROVIDER_SITE_OTHER): Payer: Medicare Other | Admitting: Internal Medicine

## 2012-03-19 ENCOUNTER — Other Ambulatory Visit: Payer: Self-pay | Admitting: *Deleted

## 2012-03-19 VITALS — BP 108/54 | HR 90 | Ht 61.0 in | Wt 161.0 lb

## 2012-03-19 DIAGNOSIS — B370 Candidal stomatitis: Secondary | ICD-10-CM

## 2012-03-19 DIAGNOSIS — E039 Hypothyroidism, unspecified: Secondary | ICD-10-CM

## 2012-03-19 DIAGNOSIS — J019 Acute sinusitis, unspecified: Secondary | ICD-10-CM

## 2012-03-19 DIAGNOSIS — E785 Hyperlipidemia, unspecified: Secondary | ICD-10-CM

## 2012-03-19 DIAGNOSIS — I509 Heart failure, unspecified: Secondary | ICD-10-CM

## 2012-03-19 MED ORDER — SIMVASTATIN 20 MG PO TABS: 20.0000 mg | ORAL_TABLET | Freq: Every day | ORAL | Status: DC

## 2012-03-19 MED ORDER — LEVOTHYROXINE SODIUM 75 MCG PO TABS: ORAL_TABLET | ORAL | Status: DC

## 2012-03-19 MED ORDER — POMALIDOMIDE 4 MG PO CAPS: 4.0000 mg | ORAL_CAPSULE | Freq: Every day | ORAL | Status: DC

## 2012-03-19 NOTE — Patient Instructions (Addendum)
Decrease the thyroid medicine to 75 mcg daily except one half on Wednesdays. Recheck the TSH  in 10 weeks. Until you finish the Biaxin for the sinus infection; do not take simvastatin. Please have lipid panel, AST, and ALT checked Friday 6/14 at the oncology office (codes 272.4, 995.20)    Appendum 03/31/2012: Home Health Certification  & Plan of Care  For 6/5-8/3/ 13  reviewed & completed. Discrepancies noted between EMR Med List & meds listed on form .Bisacodyl , Promalione &Tramadol not in EMR.Thyroid supplementation dose incorrect. Handwritten notes made on form  & request made for clarification as follows: To help prevent medication errors; please list medications by [facility] name in alphabetized format with brand name  in parentheses  behind generic name. Example:Simvastatin (Zocor ) 20 mg @ bedtime. Please document the name of the prescribing caregiver beside each medication listed on the patient's Plan of Care medication list.  These will enhance continuity of care and help prevent potential  adverse drug:drug interactions due to duplication of medications as branded and generic forms ; repeated medication changes with hospitalizations and/or  at outpatient subspecialty visits; and lack of the EMR in some medical practices. Such risk is greatest with polypharmacy especially among  geriatric patients as has been documented repeatedly by Dr. Haig Prophet and his associates in multiple long-term studies. Weaning of Amitriptyline could be reconsidered @ next OV .  Appendum 05/13/12: This same certification form for 6/5-05/11/12 was received for completion again. The form was signed and a copy of this addendum to our office visit 03/19/12 was attached to that form.

## 2012-03-19 NOTE — Progress Notes (Signed)
  Subjective:    Patient ID: Karen Barron, female    DOB: 05-Sep-1926, 78 y.o.   MRN: [id number]  HPI She was hospitalized 02/26/12-03/11/12 with heart failure. Since being home she has had to  sleep in power lift chair. She also has intractable edema and has had calf discomfort with ambulation. She describes marked weakness in her lower extremities. She has not seen her cardiologist, Dr. Daleen Squibb, since Fall 2012.  Fraser Din states that she was seen by cardiologist on rounds each day in the hospital. Records were reviewed;: 6/1 chest x-ray revealed mild cardiac enlargement and increased interstitial edema. Her BNP was 6505 on 5/26 and 10,322 on 5/24. The lowest recorded value was 2471 on 06/02/11.   Review of Systems she is having some green sputum which she believes is coming from the sinuses; she is presently on Biaxin . She denies hemoptysis, fever, chills, or sweats  Her TSH was 0.351 on 5/13     Objective:   Physical Exam General appearance:fatigued appearing ;well nourished; no acute distress or increased work of breathing is present.  No  lymphadenopathy about the head, neck, or axilla noted.   Eyes: No conjunctival inflammation or lid edema is present. EOMI  Ears:  External ear exam shows no significant lesions or deformities.  Otoscopic examination reveals clear canals, tympanic membranes are intact bilaterally without bulging, retraction, inflammation or discharge.  Nose:  External nasal examination shows no deformity or inflammation. Nasal mucosa are pink and moist without lesions or exudates. No septal dislocation or deviation.No obstruction to airflow.   Oral exam: Dental hygiene is good; lips and gums are healthy appearing.There is no oropharyngeal erythema or exudate noted. Localized Candida right posterior pharynx  Neck:  No deformities, thyromegaly, masses, or tenderness noted.     Heart:  Accentuated first and second heart sounds; questionable intermittent S3  Lungs: Minimal  rales at the bases.No increased work of breathing.    Extremities:  No cyanosis,  clubbing  noted . 1+ pitting edema to upper shin.  Posterior tibial pulses are decreased due to the edema; dorsalis pedis pulses are palpated   Skin: Warm & dry w/o jaundice           Assessment & Plan:  #1 congestive heart failure with persistently elevated BNP and paroxysmal nocturnal dyspnea  #2 sinusitis, on Biaxin  #3 localized Candida  Plan: See orders and recommendations. She will be referred to the heart failure clinic.  Nystatin lozenges will be prescribed  CT of the sinuses should be performed if the purulent nasal secretions persist after completing the Biaxin

## 2012-03-20 ENCOUNTER — Telehealth: Payer: Self-pay | Admitting: *Deleted

## 2012-03-20 LAB — PROTEIN ELECTROPHORESIS, SERUM
Alpha-2-Globulin: 7.7 % (ref 7.1–11.8)
Beta 2: 4.6 % (ref 3.2–6.5)
Beta Globulin: 3.1 % — ABNORMAL LOW (ref 4.7–7.2)
M-Spike, %: 3.59 g/dL
Total Protein, Serum Electrophoresis: 9.1 g/dL — ABNORMAL HIGH (ref 6.0–8.3)

## 2012-03-20 LAB — BASIC METABOLIC PANEL
BUN: 30 mg/dL — ABNORMAL HIGH (ref 6–23)
CO2: 24 mEq/L (ref 19–32)
Chloride: 101 mEq/L (ref 96–112)
Creatinine, Ser: 1.17 mg/dL — ABNORMAL HIGH (ref 0.50–1.10)
Potassium: 4.1 mEq/L (ref 3.5–5.3)

## 2012-03-20 MED ORDER — CLOTRIMAZOLE 10 MG MT TROC: 10.0000 mg | Freq: Every day | OROMUCOSAL | Status: AC

## 2012-03-20 NOTE — Telephone Encounter (Signed)
Mycelex dissolve 1 trouche 5 times a day x14 days

## 2012-03-20 NOTE — Telephone Encounter (Signed)
Pharmacy left VM that the Nystatin lozenges are no longer available. Per pharmacy a alternative is the Mycelex lozenges. .Please advise

## 2012-03-20 NOTE — Telephone Encounter (Signed)
Done

## 2012-03-22 ENCOUNTER — Telehealth: Payer: Self-pay | Admitting: *Deleted

## 2012-03-22 ENCOUNTER — Other Ambulatory Visit: Payer: Self-pay | Admitting: Internal Medicine

## 2012-03-22 ENCOUNTER — Other Ambulatory Visit (HOSPITAL_BASED_OUTPATIENT_CLINIC_OR_DEPARTMENT_OTHER): Payer: Medicare Other | Admitting: Lab

## 2012-03-22 DIAGNOSIS — C9002 Multiple myeloma in relapse: Secondary | ICD-10-CM

## 2012-03-22 LAB — CBC WITH DIFFERENTIAL/PLATELET
BASO%: 1 % (ref 0.0–2.0)
Eosinophils Absolute: 0 10*3/uL (ref 0.0–0.5)
MONO#: 0.7 10*3/uL (ref 0.1–0.9)
NEUT#: 0.7 10*3/uL — ABNORMAL LOW (ref 1.5–6.5)
RBC: 2.73 10*6/uL — ABNORMAL LOW (ref 3.70–5.45)
RDW: 16.6 % — ABNORMAL HIGH (ref 11.2–14.5)
WBC: 3.1 10*3/uL — ABNORMAL LOW (ref 3.9–10.3)
nRBC: 1 % — ABNORMAL HIGH (ref 0–0)

## 2012-03-22 LAB — TECHNOLOGIST REVIEW

## 2012-03-22 NOTE — Telephone Encounter (Signed)
Spoke with patient told her to continue with current therapy, dexamethasome 40mg  weekly. Patient verbalized understanding.

## 2012-03-23 LAB — LIPID PANEL
HDL: 35 mg/dL — ABNORMAL LOW (ref >39)
Triglycerides: 56 mg/dL (ref <150)

## 2012-03-23 LAB — AST: AST: 19 U/L (ref 0–37)

## 2012-03-25 ENCOUNTER — Encounter: Payer: Self-pay | Admitting: *Deleted

## 2012-03-26 ENCOUNTER — Telehealth: Payer: Self-pay | Admitting: Medical Oncology

## 2012-03-26 NOTE — Telephone Encounter (Signed)
See not 03-26-12

## 2012-03-26 NOTE — Telephone Encounter (Signed)
Spoke with pt and let her know that her Igm and M-spike are slightly better and to proceed with pomelyst as scheduled per dr Truett Perna.  Pt verbalized understanding, /  dmr

## 2012-03-29 ENCOUNTER — Ambulatory Visit (HOSPITAL_BASED_OUTPATIENT_CLINIC_OR_DEPARTMENT_OTHER): Payer: Medicare Other | Admitting: Oncology

## 2012-03-29 ENCOUNTER — Other Ambulatory Visit (HOSPITAL_BASED_OUTPATIENT_CLINIC_OR_DEPARTMENT_OTHER): Payer: Medicare Other | Admitting: Lab

## 2012-03-29 ENCOUNTER — Telehealth: Payer: Self-pay | Admitting: Oncology

## 2012-03-29 VITALS — BP 104/66 | HR 92 | Temp 98.8°F | Ht 61.0 in | Wt 154.3 lb

## 2012-03-29 DIAGNOSIS — C9002 Multiple myeloma in relapse: Secondary | ICD-10-CM

## 2012-03-29 DIAGNOSIS — D696 Thrombocytopenia, unspecified: Secondary | ICD-10-CM

## 2012-03-29 DIAGNOSIS — C9001 Multiple myeloma in remission: Secondary | ICD-10-CM

## 2012-03-29 DIAGNOSIS — C9000 Multiple myeloma not having achieved remission: Secondary | ICD-10-CM

## 2012-03-29 DIAGNOSIS — D649 Anemia, unspecified: Secondary | ICD-10-CM

## 2012-03-29 LAB — CBC WITH DIFFERENTIAL/PLATELET
Basophils Absolute: 0 10*3/uL (ref 0.0–0.1)
Eosinophils Absolute: 0.1 10*3/uL (ref 0.0–0.5)
HGB: 8.1 g/dL — ABNORMAL LOW (ref 11.6–15.9)
MCV: 91 fL (ref 79.5–101.0)
MONO#: 0.5 10*3/uL (ref 0.1–0.9)
MONO%: 19.9 % — ABNORMAL HIGH (ref 0.0–14.0)
NEUT#: 0.9 10*3/uL — ABNORMAL LOW (ref 1.5–6.5)
RDW: 16.5 % — ABNORMAL HIGH (ref 11.2–14.5)
WBC: 2.4 10*3/uL — ABNORMAL LOW (ref 3.9–10.3)

## 2012-03-29 NOTE — Telephone Encounter (Signed)
appts made and printed for pt aom °

## 2012-03-29 NOTE — Progress Notes (Signed)
[facility]    OFFICE PROGRESS NOTE   INTERVAL HISTORY:   Karen Barron returns as scheduled. She started another cycle of pomalidomide on 03/23/2012. She reports improvement in the dyspnea. She continues to have a cough and wheezing.  She is now completing a home physical therapy program. She is ambulating in the home. The leg edema has improved.  Objective:  Vital signs in last 24 hours:  Blood pressure 104/66, pulse 92, temperature 98.8 F (37.1 C), temperature source Oral, height 5\' 1"  (1.549 m), weight 154 lb 4.8 oz (69.99 kg).    HEENT: No thrush or ulcers Resp: Scattered end inspiratory rhonchi and wheezes at the upper anterior and posterior chest. The lower lungs are clear. No respiratory distress. Cardio: Regular rate and rhythm GI: No hepatosplenomegaly Vascular: Trace low leg edema bilaterally-improved   Portacath/PICC-without erythema  Lab Results:  Lab Results  Component Value Date   WBC 2.4* 03/29/2012   HGB 8.1* 03/29/2012   HCT 25.2* 03/29/2012   MCV 91.0 03/29/2012   PLT 21* 03/29/2012   ANC 0.9   Medications: I have reviewed the patient's current medications.  Assessment/Plan: 1. Multiple myeloma treated with subcutaneous Velcade and Cytoxan/Decadron on a weekly schedule beginning in May 2011. She completed 1 year of treatment on 01/24/2011. The serum M-spike and IgM level were increased beginning in May 2012. She began treatment with carfilzomib on 05/25/2011. She completed 2 cycles. The serum M-spike and IgM level were higher on 07/12/2011. She began cycle 1 melphalan/prednisone 07/25/2011. The IgM level was improved on 08/21/2011. She began cycle 2 melphalan/prednisone on 08/29/2011. The melphalan dose was reduced to 8 mg daily for 4 days beginning with cycle 2. The melphalan was dose reduced to 6 mg daily for 4 days beginning with the third cycle of melphalan/prednisone on 09/27/2011. She completed a fourth cycle of melphalan/prednisone  beginning on 10/25/2011. The IgM level was stable on 11/15/2011. She completed cycle 6 melphalan/prednisone beginning 12/29/2011. The IgM level was increased on 01/22/2012. She began Pomalidomide on 02/26/2012 with weekly dexamethasone. She began cycle 2 on 03/23/2012. The serum M spike and IgM level were slightly lower on 03/18/2012. 2. Hospitalization with shortness of breath/acute diastolic heart failure 02/26/2012 through 03/11/2012. 3. Hospitalization with dyspnea/hypoxia secondary to volume overload in the setting of diastolic heart failure on 05/26/2011. 4. Hospitalization 06/02/2011 with dyspnea. She was found to have marked anemia with a hemoglobin of 6.7. She received a red cell transfusion 09/27/2011. 5. History of congestive heart failure status post hospital admission 05/26/2011. 6. Thrombocytopenia secondary to multiple myeloma. Stable. 7. T7 compression fracture status post kyphoplasty 11/23/2007 by Dr. Noel Gerold. 8. Peripheral neuropathy status post evaluation by Dr. Anne Hahn. The neuropathy is most likely related to Velcade therapy. 9. Herpes zoster at the left abdomen and chest wall status post Valtrex therapy. 10. Postherpetic neuralgia. 11. History of neutropenia secondary to Velcade and Cytoxan. There is persistent neutropenia, likely related to multiple myeloma. 12. Pain and swelling of the proximal interphalangeal joint of the right 3rd finger when she was here on 02/21/2011, improved. She was treated with a Medrol Dosepak. 13. Cough, wheezing, dyspnea, and pleuritic right-sided chest pain and here on 01/22/2012. Chest CT showed right upper lobe pneumonia. She completed a 10 day course of Avelox on 01/31/2012. 14. Persistent severe anemia. She last transfused on 03/08/12 15. Left wrist pain/tenderness, erythema and edema-02/02/2012,? Gout versus a joint infection. Improved after Keflex and a Medrol Dosepak.  Disposition:  Her performance status has  improved over the past few weeks.  She began a second cycle of pomalidomide/Decadron on 03/23/2012. There is stable pancytopenia secondary to multiple myeloma. Ms. Caccamo will continue pomalidomide/Decadron. She will return for an office visit in one week. We will followup on the IgM level from today   Thornton Papas, MD  03/29/2012  5:51 PM

## 2012-03-31 DIAGNOSIS — IMO0001 Reserved for inherently not codable concepts without codable children: Secondary | ICD-10-CM

## 2012-03-31 DIAGNOSIS — I5033 Acute on chronic diastolic (congestive) heart failure: Secondary | ICD-10-CM

## 2012-03-31 DIAGNOSIS — R269 Unspecified abnormalities of gait and mobility: Secondary | ICD-10-CM

## 2012-03-31 DIAGNOSIS — I509 Heart failure, unspecified: Secondary | ICD-10-CM

## 2012-04-02 ENCOUNTER — Ambulatory Visit: Payer: Medicare Other | Admitting: Nutrition

## 2012-04-02 NOTE — Assessment & Plan Note (Signed)
Karen Barron is an 78 year old female patient of Dr. Kalman Drape diagnosed with multiple myeloma.  MEDICAL HISTORY INCLUDES:  Osteoporosis, Waldenstrom's macroglobulinemia, diverticular disease, thyroid disease, hypertension, hyperlipidemia, spinal stenosis, asthma, anemia, degenerative joint disease, esophageal reflux, hiatal hernia, chronic diastolic CHF and diabetes in pregnancy.  MEDICATIONS INCLUDE:  Decadron, Nexium, Lasix, Synthroid, Zofran, K-Dur, Compazine, and Zocor.  LABS:  Glucose of 158, BUN 30, creatinine 1.17, all documented 03/18/2012.  HEIGHT:  5 feet 1 inch. WEIGHT:  154.3 pounds. USUAL BODY WEIGHT:  165 pounds. BMI:  29.17.  The patient was identified at risk on the nutrition risk screen.  I called her on the telephone today to talk to her.  She reports that she has not felt well today.  She continues to have poor appetite.  She is trying to eat. Her family has purchased a lot of different things for her to eat; however, she just is not able to consume much at any one given time. She says Ensure makes her sick to her stomach.  She drinks a little bit of Lactaid milk in the morning.  NUTRITION DIAGNOSIS:  Unintended weight loss related to poor appetite and inadequate oral intake as evidenced by a 7% weight loss from usual body weight.  INTERVENTION:  I have briefly educated Karen Barron on strategies for increasing calories and protein in small amounts throughout the day.  I have encouraged her to continue with foods she does tolerate.  I will provide some additional nutrition supplement samples for her to pick up on Thursday when she comes to see the doctor.  I will also provide her with some fact sheets she can refer to.  MONITORING/EVALUATION (GOALS):  That patient will tolerate increased oral intake to improve quality of life and minimize further weight loss.  NEXT VISIT:  Patient will contact me with questions or concerns.    ______________________________ Zenovia Jarred, RD,  LDN Clinical Nutrition Specialist BN/MEDQ  D:  04/02/2012  T:  04/02/2012  Job:  [id number]

## 2012-04-04 ENCOUNTER — Other Ambulatory Visit: Payer: Self-pay | Admitting: Nurse Practitioner

## 2012-04-04 ENCOUNTER — Other Ambulatory Visit: Payer: Medicare Other

## 2012-04-04 ENCOUNTER — Telehealth: Payer: Self-pay | Admitting: Oncology

## 2012-04-04 ENCOUNTER — Encounter (HOSPITAL_COMMUNITY)
Admission: RE | Admit: 2012-04-04 | Discharge: 2012-04-04 | Disposition: A | Discharge status: Home / Self Care | Payer: Medicare Other | Source: Ambulatory Visit | Attending: Oncology | Admitting: Oncology

## 2012-04-04 ENCOUNTER — Ambulatory Visit (HOSPITAL_BASED_OUTPATIENT_CLINIC_OR_DEPARTMENT_OTHER): Payer: Medicare Other | Admitting: Nurse Practitioner

## 2012-04-04 VITALS — BP 108/61 | HR 88 | Temp 98.3°F | Ht 61.0 in | Wt 155.5 lb

## 2012-04-04 DIAGNOSIS — D649 Anemia, unspecified: Secondary | ICD-10-CM

## 2012-04-04 DIAGNOSIS — C9002 Multiple myeloma in relapse: Secondary | ICD-10-CM

## 2012-04-04 DIAGNOSIS — C9001 Multiple myeloma in remission: Secondary | ICD-10-CM

## 2012-04-04 LAB — BASIC METABOLIC PANEL
BUN: 16 mg/dL (ref 6–23)
CO2: 26 mEq/L (ref 19–32)
Chloride: 104 mEq/L (ref 96–112)
Creatinine, Ser: 0.99 mg/dL (ref 0.50–1.10)

## 2012-04-04 LAB — CBC WITH DIFFERENTIAL/PLATELET
BASO%: 1.2 % (ref 0.0–2.0)
Eosinophils Absolute: 0.1 10*3/uL (ref 0.0–0.5)
HCT: 22.1 % — ABNORMAL LOW (ref 34.8–46.6)
LYMPH%: 49.9 % — ABNORMAL HIGH (ref 14.0–49.7)
MCHC: 33.4 g/dL (ref 31.5–36.0)
MONO#: 0.5 10*3/uL (ref 0.1–0.9)
NEUT%: 31 % — ABNORMAL LOW (ref 38.4–76.8)
Platelets: 38 10*3/uL — ABNORMAL LOW (ref 145–400)
WBC: 3.6 10*3/uL — ABNORMAL LOW (ref 3.9–10.3)

## 2012-04-04 LAB — PREPARE RBC (CROSSMATCH)

## 2012-04-04 NOTE — Progress Notes (Signed)
OFFICE PROGRESS NOTE  Interval history:  [name] returns as scheduled. She continues Pomalidomide. She continues physical therapy. She feels she is stronger overall. Appetite is improving. She notes a mild increase in dyspnea on exertion. She has a persistent cough. She denies fever. Leg swelling is better. Earlier this week she had multiple loose stools.   Objective: Blood pressure 108/61, pulse 88, temperature 98.3 F (36.8 C), temperature source Oral, height 5\' 1"  (1.549 m), weight 155 lb 8 oz (70.534 kg).  Oropharynx is without thrush. Lungs are clear. No wheezes or rales. Regular cardiac rhythm. Port-A-Cath site is without erythema. Abdomen is soft and nontender. Trace lower leg edema bilaterally.  Lab Results: Lab Results  Component Value Date   WBC 3.6* 04/04/2012   HGB 7.4* 04/04/2012   HCT 22.1* 04/04/2012   MCV 91.1 04/04/2012   PLT 38* 04/04/2012    Chemistry:    Chemistry      Component Value Date/Time   NA 137 03/18/2012 1332   NA 136 01/22/2012 1118   K 4.1 03/18/2012 1332   K 3.5 01/22/2012 1118   CL 101 03/18/2012 1332   CL 98 01/22/2012 1118   CO2 24 03/18/2012 1332   CO2 28 01/22/2012 1118   BUN 30* 03/18/2012 1332   BUN 15 01/22/2012 1118   CREATININE 1.17* 03/18/2012 1332   CREATININE 1.1 01/22/2012 1118   CREATININE 1.08 10/18/2006 1023      Component Value Date/Time   CALCIUM 8.7 03/18/2012 1332   CALCIUM 8.3 01/22/2012 1118   ALKPHOS 41 03/04/2012 1055   AST 19 03/22/2012 1000   ALT 62* 03/22/2012 1000   BILITOT 2.1* 03/04/2012 1055       Studies/Results: Dg Chest Port 1 View  03/09/2012  *RADIOLOGY REPORT*  Clinical Data: Cough, history of multiple myeloma.  History of asthma, CHF.  PORTABLE CHEST - 1 VIEW  Comparison: 02/25/2012  Findings: Right Port-A-Cath remains in place, unchanged.  Diffuse interstitial prominence throughout the lungs, slightly progressed since prior study raise the possibility of interstitial edema. Suspect a component of chronic  interstitial lung disease.  Mild cardiomegaly.  No effusions.  Changes of left shoulder replacement.  Sclerotic focus in the right humeral head, likely infarct or enchondroma, unchanged.  No acute bony abnormality.  IMPRESSION: Increasing interstitial prominence, possibly interstitial edema superimposed on chronic interstitial lung disease.  Mild cardiomegaly.  Original Report Authenticated By: Karen Barron, M.D.    Medications: I have reviewed the patient's current medications.  Assessment/Plan:  1. Multiple myeloma treated with subcutaneous Velcade and Cytoxan/Decadron on a weekly schedule beginning in May 2011. She completed 1 year of treatment on 01/24/2011. The serum M-spike and IgM level were increased beginning in May 2012. She began treatment with carfilzomib on 05/25/2011. She completed 2 cycles. The serum M-spike and IgM level were higher on 07/12/2011. She began cycle 1 melphalan/prednisone 07/25/2011. The IgM level was improved on 08/21/2011. She began cycle 2 melphalan/prednisone on 08/29/2011. The melphalan dose was reduced to 8 mg daily for 4 days beginning with cycle 2. The melphalan was dose reduced to 6 mg daily for 4 days beginning with the third cycle of melphalan/prednisone on 09/27/2011. She completed a fourth cycle of melphalan/prednisone beginning on 10/25/2011. The IgM level was stable on 11/15/2011. She completed cycle 6 melphalan/prednisone beginning 12/29/2011. The IgM level was increased on 01/22/2012. She began Pomalidomide on 02/26/2012 with weekly dexamethasone. She began cycle 2 on 03/23/2012. The serum M spike and IgM level were slightly  lower on 03/18/2012. 2. Hospitalization with shortness of breath/acute diastolic heart failure 02/26/2012 through 03/11/2012. 3. Hospitalization with dyspnea/hypoxia secondary to volume overload in the setting of diastolic heart failure on 05/26/2011. 4. Hospitalization 06/02/2011 with dyspnea. She was found to have marked anemia with a  hemoglobin of 6.7. She received a red cell transfusion 09/27/2011. 5. History of congestive heart failure status post hospital admission 05/26/2011. 6. Thrombocytopenia secondary to multiple myeloma. Stable. 7. T7 compression fracture status post kyphoplasty 11/23/2007 by Dr. Noel Barron. 8. Peripheral neuropathy status post evaluation by Dr. Anne Barron. The neuropathy is most likely related to Velcade therapy. 9. Herpes zoster at the left abdomen and chest wall status post Valtrex therapy. 10. Postherpetic neuralgia. 11. History of neutropenia secondary to Velcade and Cytoxan. There is persistent neutropenia. 12. Pain and swelling of the proximal interphalangeal joint of the right 3rd finger when she was here on 02/21/2011, improved. She was treated with a Medrol Dosepak. 13. Cough, wheezing, dyspnea, and pleuritic right-sided chest pain and here on 01/22/2012. Chest CT showed right upper lobe pneumonia. She completed a 10 day course of Avelox on 01/31/2012. 14. Persistent severe anemia. She last transfused on 03/08/12. 15. Left wrist pain/tenderness, erythema and edema-02/02/2012,? Gout versus a joint infection. Improved after Keflex and a Medrol Dosepak.  Disposition-[name] appears improved. She is completing cycle 2 Pomalidomide. We will obtain a followup IgM level in one week. She will return for a followup visit on 04/18/2012. We are arranging for a blood transfusion this week. She will contact the office prior to her next visit with any problems.  Plan reviewed with Dr. Truett Barron.  Karen Barron ANP/GNP-BC

## 2012-04-04 NOTE — Telephone Encounter (Signed)
Gv pt appt for june and july2013

## 2012-04-06 ENCOUNTER — Ambulatory Visit (HOSPITAL_BASED_OUTPATIENT_CLINIC_OR_DEPARTMENT_OTHER): Payer: Medicare Other

## 2012-04-06 VITALS — BP 112/57 | HR 81 | Temp 98.5°F | Resp 20

## 2012-04-06 DIAGNOSIS — D649 Anemia, unspecified: Secondary | ICD-10-CM

## 2012-04-06 MED ORDER — SODIUM CHLORIDE 0.9 % IV SOLN
250.0000 mL | Freq: Once | INTRAVENOUS | Status: AC
  Administered 2012-04-06: 250 mL via INTRAVENOUS

## 2012-04-06 MED ORDER — FUROSEMIDE 10 MG/ML IJ SOLN
20.0000 mg | Freq: Once | INTRAMUSCULAR | Status: AC
  Administered 2012-04-06: 20 mg via INTRAVENOUS

## 2012-04-06 MED ORDER — HEPARIN SOD (PORK) LOCK FLUSH 100 UNIT/ML IV SOLN
500.0000 [IU] | Freq: Every day | INTRAVENOUS | Status: AC | PRN
  Administered 2012-04-06: 500 [IU]
  Filled 2012-04-06: qty 5

## 2012-04-06 MED ORDER — SODIUM CHLORIDE 0.9 % IJ SOLN
10.0000 mL | INTRAMUSCULAR | Status: AC | PRN
  Administered 2012-04-06: 10 mL
  Filled 2012-04-06: qty 10

## 2012-04-06 NOTE — Patient Instructions (Addendum)
Blood Transfusion Information  WHAT IS A BLOOD TRANSFUSION?  A transfusion is the replacement of blood or some of its parts. Blood is made up of multiple cells which provide different functions.   Red blood cells carry oxygen and are used for blood loss replacement.   White blood cells fight against infection.   Platelets control bleeding.   Plasma helps clot blood.   Other blood products are available for specialized needs, such as hemophilia or other clotting disorders.  BEFORE THE TRANSFUSION   Who gives blood for transfusions?    You may be able to donate blood to be used at a later date on yourself (autologous donation).   Relatives can be asked to donate blood. This is generally not any safer than if you have received blood from a stranger. The same precautions are taken to ensure safety when a relative's blood is donated.   Healthy volunteers who are fully evaluated to make sure their blood is safe. This is blood bank blood.  Transfusion therapy is the safest it has ever been in the practice of medicine. Before blood is taken from a donor, a complete history is taken to make sure that person has no history of diseases nor engages in risky social behavior (examples are intravenous drug use or sexual activity with multiple partners). The donor's travel history is screened to minimize risk of transmitting infections, such as malaria. The donated blood is tested for signs of infectious diseases, such as HIV and hepatitis. The blood is then tested to be sure it is compatible with you in order to minimize the chance of a transfusion reaction. If you or a relative donates blood, this is often done in anticipation of surgery and is not appropriate for emergency situations. It takes many days to process the donated blood.  RISKS AND COMPLICATIONS  Although transfusion therapy is very safe and saves many lives, the main dangers of transfusion include:    Getting an infectious disease.   Developing a  transfusion reaction. This is an allergic reaction to something in the blood you were given. Every precaution is taken to prevent this.  The decision to have a blood transfusion has been considered carefully by your caregiver before blood is given. Blood is not given unless the benefits outweigh the risks.  AFTER THE TRANSFUSION   Right after receiving a blood transfusion, you will usually feel much better and more energetic. This is especially true if your red blood cells have gotten low (anemic). The transfusion raises the level of the red blood cells which carry oxygen, and this usually causes an energy increase.   The nurse administering the transfusion will monitor you carefully for complications.  HOME CARE INSTRUCTIONS   No special instructions are needed after a transfusion. You may find your energy is better. Speak with your caregiver about any limitations on activity for underlying diseases you may have.  SEEK MEDICAL CARE IF:    Your condition is not improving after your transfusion.   You develop redness or irritation at the intravenous (IV) site.  SEEK IMMEDIATE MEDICAL CARE IF:   Any of the following symptoms occur over the next 12 hours:   Shaking chills.   You have a temperature by mouth above 102 F (38.9 C), not controlled by medicine.   Chest, back, or muscle pain.   People around you feel you are not acting correctly or are confused.   Shortness of breath or difficulty breathing.   Dizziness and fainting.     You get a rash or develop hives.   You have a decrease in urine output.   Your urine turns a dark color or changes to pink, red, or brown.  Any of the following symptoms occur over the next 10 days:   You have a temperature by mouth above 102 F (38.9 C), not controlled by medicine.   Shortness of breath.   Weakness after normal activity.   The white part of the eye turns yellow (jaundice).   You have a decrease in the amount of urine or are urinating less often.   Your  urine turns a dark color or changes to pink, red, or brown.  Document Released: 09/22/2000 Document Revised: 09/14/2011 Document Reviewed: 05/11/2008  ExitCare Patient Information 2012 [employer].

## 2012-04-08 LAB — TYPE AND SCREEN

## 2012-04-12 ENCOUNTER — Other Ambulatory Visit (HOSPITAL_BASED_OUTPATIENT_CLINIC_OR_DEPARTMENT_OTHER): Payer: Medicare Other | Admitting: Lab

## 2012-04-12 ENCOUNTER — Telehealth: Payer: Self-pay | Admitting: *Deleted

## 2012-04-12 DIAGNOSIS — C9002 Multiple myeloma in relapse: Secondary | ICD-10-CM

## 2012-04-12 DIAGNOSIS — C9000 Multiple myeloma not having achieved remission: Secondary | ICD-10-CM

## 2012-04-12 LAB — CBC WITH DIFFERENTIAL/PLATELET
Basophils Absolute: 0 10*3/uL (ref 0.0–0.1)
Eosinophils Absolute: 0.3 10*3/uL (ref 0.0–0.5)
LYMPH%: 69.3 % — ABNORMAL HIGH (ref 14.0–49.7)
MCH: 30.1 pg (ref 25.1–34.0)
MCV: 92 fL (ref 79.5–101.0)
MONO%: 12.9 % (ref 0.0–14.0)
NEUT#: 0.2 10*3/uL — CL (ref 1.5–6.5)
Platelets: 38 10*3/uL — ABNORMAL LOW (ref 145–400)
RBC: 2.99 10*6/uL — ABNORMAL LOW (ref 3.70–5.45)
nRBC: 2 % — ABNORMAL HIGH (ref 0–0)

## 2012-04-12 MED ORDER — CIPROFLOXACIN HCL 500 MG PO TABS: 500.0000 mg | ORAL_TABLET | Freq: Two times a day (BID) | ORAL | Status: DC

## 2012-04-12 NOTE — Telephone Encounter (Signed)
Per husband's earlier voice mail, patient due last dose of pomalidomide tonight. Still has cough (baseline). No fever and feels "reasonably well". Labs reviewed by Dr. Truett Perna: Hold last dose of Pomalidomide today and start Cipro 500 mg bid X 7 days. Call for fever or s/s of infection. Notified daughter, but she had already taken her last chemo pill at 3:30pm today. Will have husband pick up the Cipro and they will call for fever. She will follow up on 7/11 as scheduled.

## 2012-04-13 LAB — BASIC METABOLIC PANEL
BUN: 20 mg/dL (ref 6–23)
CO2: 26 mEq/L (ref 19–32)
Chloride: 104 mEq/L (ref 96–112)
Creatinine, Ser: 0.97 mg/dL (ref 0.50–1.10)

## 2012-04-15 ENCOUNTER — Other Ambulatory Visit: Payer: Self-pay | Admitting: *Deleted

## 2012-04-15 DIAGNOSIS — C9000 Multiple myeloma not having achieved remission: Secondary | ICD-10-CM

## 2012-04-15 MED ORDER — POMALIDOMIDE 4 MG PO CAPS: 4.0000 mg | ORAL_CAPSULE | Freq: Every day | ORAL | Status: DC

## 2012-04-15 NOTE — Telephone Encounter (Signed)
Fax request received for pomalyst from [facility]  and given to Kem Boroughs RN/Dr. Truett Perna

## 2012-04-17 ENCOUNTER — Other Ambulatory Visit: Payer: Self-pay | Admitting: Oncology

## 2012-04-18 ENCOUNTER — Ambulatory Visit (HOSPITAL_BASED_OUTPATIENT_CLINIC_OR_DEPARTMENT_OTHER): Payer: Medicare Other | Admitting: Nurse Practitioner

## 2012-04-18 ENCOUNTER — Other Ambulatory Visit (HOSPITAL_BASED_OUTPATIENT_CLINIC_OR_DEPARTMENT_OTHER): Payer: Medicare Other | Admitting: Lab

## 2012-04-18 ENCOUNTER — Telehealth: Payer: Self-pay | Admitting: Oncology

## 2012-04-18 VITALS — BP 109/58 | HR 82 | Temp 97.1°F | Ht 61.0 in | Wt 158.5 lb

## 2012-04-18 DIAGNOSIS — C9002 Multiple myeloma in relapse: Secondary | ICD-10-CM

## 2012-04-18 DIAGNOSIS — C9000 Multiple myeloma not having achieved remission: Secondary | ICD-10-CM

## 2012-04-18 DIAGNOSIS — D649 Anemia, unspecified: Secondary | ICD-10-CM

## 2012-04-18 DIAGNOSIS — D696 Thrombocytopenia, unspecified: Secondary | ICD-10-CM

## 2012-04-18 LAB — CBC WITH DIFFERENTIAL/PLATELET
Basophils Absolute: 0 10*3/uL (ref 0.0–0.1)
EOS%: 2.6 % (ref 0.0–7.0)
HCT: 26.8 % — ABNORMAL LOW (ref 34.8–46.6)
HGB: 8.9 g/dL — ABNORMAL LOW (ref 11.6–15.9)
MCH: 30.8 pg (ref 25.1–34.0)
MONO#: 0.7 10*3/uL (ref 0.1–0.9)
NEUT#: 0.9 10*3/uL — ABNORMAL LOW (ref 1.5–6.5)
NEUT%: 21.2 % — ABNORMAL LOW (ref 38.4–76.8)
RDW: 16.8 % — ABNORMAL HIGH (ref 11.2–14.5)
WBC: 4.4 10*3/uL (ref 3.9–10.3)
lymph#: 2.6 10*3/uL (ref 0.9–3.3)

## 2012-04-18 NOTE — Telephone Encounter (Signed)
appts made and printed for pt aom °

## 2012-04-18 NOTE — Progress Notes (Signed)
OFFICE PROGRESS NOTE  Interval history:  Karen Barron returns as scheduled. She completed cycle 2 Pomalidomide on 04/12/2012. She has intermittent nausea. No vomiting. No mouth sores. Bowels moving regularly. Cough is better. Energy level is improving. No skin rash. She continues working with physical therapy.   Objective: Blood pressure 109/58, pulse 82, temperature 97.1 F (36.2 C), temperature source Oral, height 5\' 1"  (1.549 m), weight 158 lb 8 oz (71.895 kg).  Oropharynx is without thrush or ulceration. Lungs are clear. Regular cardiac rhythm. Port-A-Cath site is without erythema. Abdomen is soft and nontender. Trace lower leg edema bilaterally.  Lab Results: Lab Results  Component Value Date   WBC 4.4 04/18/2012   HGB 8.9* 04/18/2012   HCT 26.8* 04/18/2012   MCV 92.5 04/18/2012   PLT 53* 04/18/2012    Chemistry:    Chemistry      Component Value Date/Time   NA 140 04/12/2012 1023   NA 136 01/22/2012 1118   K 4.0 04/12/2012 1023   K 3.5 01/22/2012 1118   CL 104 04/12/2012 1023   CL 98 01/22/2012 1118   CO2 26 04/12/2012 1023   CO2 28 01/22/2012 1118   BUN 20 04/12/2012 1023   BUN 15 01/22/2012 1118   CREATININE 0.97 04/12/2012 1023   CREATININE 1.1 01/22/2012 1118   CREATININE 1.08 10/18/2006 1023      Component Value Date/Time   CALCIUM 8.8 04/12/2012 1023   CALCIUM 8.3 01/22/2012 1118   ALKPHOS 41 03/04/2012 1055   AST 19 03/22/2012 1000   ALT 62* 03/22/2012 1000   BILITOT 2.1* 03/04/2012 1055       Studies/Results: No results found.  Medications: I have reviewed the patient's current medications.  Assessment/Plan:  1. Multiple myeloma treated with subcutaneous Velcade and Cytoxan/Decadron on a weekly schedule beginning in May 2011. She completed 1 year of treatment on 01/24/2011. The serum M-spike and IgM level were increased beginning in May 2012. She began treatment with carfilzomib on 05/25/2011. She completed 2 cycles. The serum M-spike and IgM level were higher on 07/12/2011. She  began cycle 1 melphalan/prednisone 07/25/2011. The IgM level was improved on 08/21/2011. She began cycle 2 melphalan/prednisone on 08/29/2011. The melphalan dose was reduced to 8 mg daily for 4 days beginning with cycle 2. The melphalan was dose reduced to 6 mg daily for 4 days beginning with the third cycle of melphalan/prednisone on 09/27/2011. She completed a fourth cycle of melphalan/prednisone beginning on 10/25/2011. The IgM level was stable on 11/15/2011. She completed cycle 6 melphalan/prednisone beginning 12/29/2011. The IgM level was increased on 01/22/2012. She began Pomalidomide on 02/26/2012 with weekly dexamethasone. She began cycle 2 on 03/23/2012. The serum M spike and IgM level were slightly lower on 03/18/2012. The IgM level was improved on 04/12/2012. 2. Hospitalization with shortness of breath/acute diastolic heart failure 02/26/2012 through 03/11/2012. 3. Hospitalization with dyspnea/hypoxia secondary to volume overload in the setting of diastolic heart failure on 05/26/2011. 4. Hospitalization 06/02/2011 with dyspnea. She was found to have marked anemia with a hemoglobin of 6.7. She received a red cell transfusion 09/27/2011. 5. History of congestive heart failure status post hospital admission 05/26/2011. 6. Thrombocytopenia secondary to multiple myeloma. Stable. 7. T7 compression fracture status post kyphoplasty 11/23/2007 by Dr. Noel Gerold. 8. Peripheral neuropathy status post evaluation by Dr. Anne Hahn. The neuropathy is most likely related to Velcade therapy. 9. Herpes zoster at the left abdomen and chest wall status post Valtrex therapy. 10. Postherpetic neuralgia. 11. History of neutropenia secondary to  Velcade and Cytoxan. There is persistent neutropenia. 12. Pain and swelling of the proximal interphalangeal joint of the right 3rd finger when she was here on 02/21/2011, improved. She was treated with a Medrol Dosepak. 13. Cough, wheezing, dyspnea, and pleuritic right-sided chest  pain and here on 01/22/2012. Chest CT showed right upper lobe pneumonia. She completed a 10 day course of Avelox on 01/31/2012. 14. Persistent severe anemia. She last transfused on 04/06/2012. 15. Left wrist pain/tenderness, erythema and edema-02/02/2012,? Gout versus a joint infection. Improved after Keflex and a Medrol Dosepak.  Disposition-Karen Barron appears stable. She has completed 2 cycles of Pomalidomide with improvement in the IgM level. She will return for a CBC 04/26/2012 with plans to proceed with cycle 3 Pomalidomide if the blood counts are adequate. She will return for a followup visit 05/23/2012. She will contact the office in the interim with any problems.  Plan reviewed Dr. Truett Perna.  Lonna Cobb ANP/GNP-BC

## 2012-04-26 ENCOUNTER — Other Ambulatory Visit (HOSPITAL_BASED_OUTPATIENT_CLINIC_OR_DEPARTMENT_OTHER): Payer: Medicare Other | Admitting: Lab

## 2012-04-26 ENCOUNTER — Telehealth: Payer: Self-pay | Admitting: *Deleted

## 2012-04-26 DIAGNOSIS — C9002 Multiple myeloma in relapse: Secondary | ICD-10-CM

## 2012-04-26 LAB — CBC WITH DIFFERENTIAL/PLATELET
Basophils Absolute: 0.2 10*3/uL — ABNORMAL HIGH (ref 0.0–0.1)
EOS%: 1.7 % (ref 0.0–7.0)
Eosinophils Absolute: 0.1 10*3/uL (ref 0.0–0.5)
HCT: 26.2 % — ABNORMAL LOW (ref 34.8–46.6)
HGB: 8.6 g/dL — ABNORMAL LOW (ref 11.6–15.9)
MCH: 30.6 pg (ref 25.1–34.0)
MCV: 93.2 fL (ref 79.5–101.0)
MONO%: 14 % (ref 0.0–14.0)
NEUT%: 28.7 % — ABNORMAL LOW (ref 38.4–76.8)
lymph#: 2.5 10*3/uL (ref 0.9–3.3)

## 2012-04-26 NOTE — Telephone Encounter (Signed)
CBC stable per Dr. Ashley Jacobs with cycle #3 Pomalidomide and follow up as scheduled.

## 2012-05-03 ENCOUNTER — Telehealth: Payer: Self-pay | Admitting: *Deleted

## 2012-05-03 ENCOUNTER — Other Ambulatory Visit (HOSPITAL_BASED_OUTPATIENT_CLINIC_OR_DEPARTMENT_OTHER): Payer: Medicare Other | Admitting: Lab

## 2012-05-03 DIAGNOSIS — C9002 Multiple myeloma in relapse: Secondary | ICD-10-CM

## 2012-05-03 LAB — CBC WITH DIFFERENTIAL/PLATELET
Eosinophils Absolute: 0.2 10*3/uL (ref 0.0–0.5)
HCT: 23 % — ABNORMAL LOW (ref 34.8–46.6)
LYMPH%: 37.5 % (ref 14.0–49.7)
MCV: 95.8 fL (ref 79.5–101.0)
MONO%: 8.1 % (ref 0.0–14.0)
NEUT#: 2 10*3/uL (ref 1.5–6.5)
NEUT%: 48 % (ref 38.4–76.8)
Platelets: 54 10*3/uL — ABNORMAL LOW (ref 145–400)
RBC: 2.4 10*6/uL — ABNORMAL LOW (ref 3.70–5.45)
nRBC: 6 % — ABNORMAL HIGH (ref 0–0)

## 2012-05-03 NOTE — Telephone Encounter (Signed)
Husband has copy of labs today. Reviewed results. [place] is doing "reasonably well". Does not feel she needs transfusion this week. Continues Pomalidomide 4 mg daily. Will add type and hold to her lab next week.

## 2012-05-06 ENCOUNTER — Telehealth: Payer: Self-pay | Admitting: *Deleted

## 2012-05-06 NOTE — Telephone Encounter (Signed)
@  1400 daughter left VM reporting temp of 100.8. Asking if OK to take Tylenol and should she continue her chemo? No specific s/s of infection. Just "feels bad". @1740  VM retrieved from phone and called husband. Temp now down to 99.8 without Tylenol. She has already taken her dose of Pomalidomide today. Per Dr. Truett Perna, OK to take Tylenol prn. Call for high fever or shaking chills or any obvious sign of infection. Watch PAC site for any redness.

## 2012-05-09 ENCOUNTER — Ambulatory Visit (HOSPITAL_COMMUNITY)
Admission: RE | Admit: 2012-05-09 | Discharge: 2012-05-09 | Disposition: A | Discharge status: Home / Self Care | Payer: Medicare Other | Source: Ambulatory Visit | Attending: Internal Medicine | Admitting: Internal Medicine

## 2012-05-09 ENCOUNTER — Encounter (HOSPITAL_COMMUNITY): Payer: Self-pay

## 2012-05-09 VITALS — BP 110/68 | HR 84 | Ht 64.0 in | Wt 155.8 lb

## 2012-05-09 DIAGNOSIS — I5032 Chronic diastolic (congestive) heart failure: Secondary | ICD-10-CM

## 2012-05-09 NOTE — Patient Instructions (Addendum)
Keep your weight 153-155 pounds.  If you weight 158 pounds or more take extra lasix 40 mg with potassium.  Follow up 1 month.    Do the following things EVERYDAY: 1) Weigh yourself in the morning before breakfast. Write it down and keep it in a log. 2) Take your medicines as prescribed 3) Eat low salt foods-Limit salt (sodium) to 2000 mg per day.  4) Stay as active as you can everyday 5) Limit all fluids for the day to less than 2 liters

## 2012-05-10 ENCOUNTER — Telehealth: Payer: Self-pay | Admitting: Family Medicine

## 2012-05-10 ENCOUNTER — Other Ambulatory Visit: Payer: Self-pay | Admitting: *Deleted

## 2012-05-10 ENCOUNTER — Other Ambulatory Visit (HOSPITAL_BASED_OUTPATIENT_CLINIC_OR_DEPARTMENT_OTHER): Payer: Medicare Other | Admitting: Lab

## 2012-05-10 ENCOUNTER — Encounter (HOSPITAL_COMMUNITY)
Admission: RE | Admit: 2012-05-10 | Discharge: 2012-05-10 | Disposition: A | Discharge status: Home / Self Care | Payer: Medicare Other | Source: Ambulatory Visit | Attending: Oncology | Admitting: Oncology

## 2012-05-10 ENCOUNTER — Other Ambulatory Visit: Payer: Self-pay | Admitting: Medical Oncology

## 2012-05-10 DIAGNOSIS — D649 Anemia, unspecified: Secondary | ICD-10-CM

## 2012-05-10 DIAGNOSIS — C9002 Multiple myeloma in relapse: Secondary | ICD-10-CM

## 2012-05-10 LAB — CBC WITH DIFFERENTIAL/PLATELET
Basophils Absolute: 0.1 10*3/uL (ref 0.0–0.1)
EOS%: 5.6 % (ref 0.0–7.0)
Eosinophils Absolute: 0.2 10*3/uL (ref 0.0–0.5)
HCT: 20.6 % — ABNORMAL LOW (ref 34.8–46.6)
HGB: 6.8 g/dL — CL (ref 11.6–15.9)
LYMPH%: 42.4 % (ref 14.0–49.7)
MCH: 31.9 pg (ref 25.1–34.0)
MCV: 96.7 fL (ref 79.5–101.0)
MONO%: 16 % — ABNORMAL HIGH (ref 0.0–14.0)
NEUT%: 33.8 % — ABNORMAL LOW (ref 38.4–76.8)
Platelets: 45 10*3/uL — ABNORMAL LOW (ref 145–400)

## 2012-05-10 NOTE — Telephone Encounter (Signed)
lmonvm for pt re appt for 8/3. Per note from susan blood 8/3 ok per maggie.

## 2012-05-10 NOTE — Progress Notes (Signed)
Reviewed  orders for transfusion for tomorrow

## 2012-05-11 ENCOUNTER — Ambulatory Visit (HOSPITAL_BASED_OUTPATIENT_CLINIC_OR_DEPARTMENT_OTHER): Payer: Medicare Other

## 2012-05-11 VITALS — BP 117/68 | HR 82 | Temp 98.2°F | Resp 20

## 2012-05-11 DIAGNOSIS — D649 Anemia, unspecified: Secondary | ICD-10-CM

## 2012-05-11 MED ORDER — SODIUM CHLORIDE 0.9 % IV SOLN
250.0000 mL | Freq: Once | INTRAVENOUS | Status: AC
  Administered 2012-05-11: 250 mL via INTRAVENOUS

## 2012-05-11 MED ORDER — SODIUM CHLORIDE 0.9 % IJ SOLN
10.0000 mL | INTRAMUSCULAR | Status: AC | PRN
  Administered 2012-05-11: 10 mL
  Filled 2012-05-11: qty 10

## 2012-05-11 MED ORDER — HEPARIN SOD (PORK) LOCK FLUSH 100 UNIT/ML IV SOLN
500.0000 [IU] | Freq: Every day | INTRAVENOUS | Status: AC | PRN
  Administered 2012-05-11: 500 [IU]
  Filled 2012-05-11: qty 5

## 2012-05-11 MED ORDER — FUROSEMIDE 10 MG/ML IJ SOLN
20.0000 mg | Freq: Once | INTRAMUSCULAR | Status: AC
  Administered 2012-05-11: 20 mg via INTRAVENOUS

## 2012-05-11 NOTE — Patient Instructions (Addendum)
Blood Transfusion   A blood transfusion replaces your blood or some of its parts. Blood is replaced when you have lost blood because of surgery, an accident, or for severe blood conditions like anemia.  You can donate blood to be used on yourself if you have a planned surgery. If you lose blood during that surgery, your own blood can be given back to you.  Any blood given to you is checked to make sure it matches your blood type. Your temperature, blood pressure, and heart rate (vital signs) will be checked often.   GET HELP RIGHT AWAY IF:    You feel sick to your stomach (nauseous) or throw up (vomit).   You have watery poop (diarrhea).   You have shortness of breath or trouble breathing.   You have blood in your pee (urine) or have dark colored pee.   You have chest pain or tightness.   Your eyes or skin turn yellow (jaundice).   You have a temperature by mouth above 102 F (38.9 C), not controlled by medicine.   You start to shake and have chills.   You develop a a red rash (hives) or feel itchy.   You develop lightheadedness or feel confused.   You develop back, joint, or muscle pain.   You do not feel hungry (lost appetite).   You feel tired, restless, or nervous.   You develop belly (abdominal) cramps.  Document Released: 12/22/2008 Document Revised: 09/14/2011 Document Reviewed: 12/22/2008  [employer] Patient Information 2012 [employer].

## 2012-05-13 ENCOUNTER — Encounter: Payer: Self-pay | Admitting: *Deleted

## 2012-05-13 LAB — TYPE AND SCREEN
ABO/RH(D): A POS
Antibody Screen: NEGATIVE
Unit division: 0

## 2012-05-13 NOTE — Progress Notes (Signed)
Prescription refill request received from [facility] for Pomalyst.  Give to and awaiting signature from Dr. Truett Perna.

## 2012-05-14 ENCOUNTER — Other Ambulatory Visit: Payer: Self-pay | Admitting: *Deleted

## 2012-05-14 DIAGNOSIS — C9001 Multiple myeloma in remission: Secondary | ICD-10-CM

## 2012-05-14 MED ORDER — ESOMEPRAZOLE MAGNESIUM 20 MG PO CPDR: 20.0000 mg | DELAYED_RELEASE_CAPSULE | Freq: Every day | ORAL | Status: AC

## 2012-05-14 MED ORDER — METOPROLOL SUCCINATE ER 50 MG PO TB24: 50.0000 mg | ORAL_TABLET | Freq: Every day | ORAL | Status: AC

## 2012-05-14 MED ORDER — POTASSIUM CHLORIDE CRYS ER 20 MEQ PO TBCR: 20.0000 meq | EXTENDED_RELEASE_TABLET | Freq: Two times a day (BID) | ORAL | Status: AC

## 2012-05-14 MED ORDER — POMALIDOMIDE 4 MG PO CAPS: 4.0000 mg | ORAL_CAPSULE | Freq: Every day | ORAL | Status: DC

## 2012-05-14 NOTE — Telephone Encounter (Signed)
Rx sent 

## 2012-05-17 ENCOUNTER — Other Ambulatory Visit (HOSPITAL_BASED_OUTPATIENT_CLINIC_OR_DEPARTMENT_OTHER): Payer: Medicare Other | Admitting: Lab

## 2012-05-17 ENCOUNTER — Other Ambulatory Visit: Payer: Self-pay | Admitting: *Deleted

## 2012-05-17 DIAGNOSIS — C9002 Multiple myeloma in relapse: Secondary | ICD-10-CM

## 2012-05-17 DIAGNOSIS — C9001 Multiple myeloma in remission: Secondary | ICD-10-CM

## 2012-05-17 LAB — CBC WITH DIFFERENTIAL/PLATELET
Basophils Absolute: 0 10*3/uL (ref 0.0–0.1)
Eosinophils Absolute: 0.2 10*3/uL (ref 0.0–0.5)
HGB: 9.2 g/dL — ABNORMAL LOW (ref 11.6–15.9)
LYMPH%: 55.6 % — ABNORMAL HIGH (ref 14.0–49.7)
MCV: 97.1 fL (ref 79.5–101.0)
MONO#: 0.2 10*3/uL (ref 0.1–0.9)
MONO%: 11.6 % (ref 0.0–14.0)
NEUT#: 0.5 10*3/uL — ABNORMAL LOW (ref 1.5–6.5)
Platelets: 31 10*3/uL — ABNORMAL LOW (ref 145–400)
RDW: 20.4 % — ABNORMAL HIGH (ref 11.2–14.5)
WBC: 2 10*3/uL — ABNORMAL LOW (ref 3.9–10.3)

## 2012-05-17 LAB — COMPREHENSIVE METABOLIC PANEL
ALT: 33 U/L (ref 0–35)
BUN: 16 mg/dL (ref 6–23)
CO2: 25 mEq/L (ref 19–32)
Calcium: 8.8 mg/dL (ref 8.4–10.5)
Chloride: 104 mEq/L (ref 96–112)
Creatinine, Ser: 1 mg/dL (ref 0.50–1.10)

## 2012-05-17 LAB — IGM: IgM, Serum: 2570 mg/dL — ABNORMAL HIGH (ref 52–322)

## 2012-05-17 MED ORDER — CIPROFLOXACIN HCL 500 MG PO TABS: 500.0000 mg | ORAL_TABLET | Freq: Two times a day (BID) | ORAL | Status: DC

## 2012-05-17 NOTE — Telephone Encounter (Signed)
Spoke with pt's husband. Pt completed this cycle of Pomalyst on 05/16/12. Due to restart on 8/16. Pt is not having any bleeding or spontaneous bruising. Denies any fevers. CBC reviewed by Misty Stanley, NP. Order received for pt to have prophylactic Cipro. Come in for lab on 8/12.

## 2012-05-17 NOTE — Telephone Encounter (Signed)
gv pt husband appt for 8/12.

## 2012-05-17 NOTE — Telephone Encounter (Signed)
Reviewed Cipro order with [facility] Pharmacist. OK for pt to take Cipro while off of Pomalyst. Rx sent to pharmacy.

## 2012-05-20 ENCOUNTER — Other Ambulatory Visit (HOSPITAL_BASED_OUTPATIENT_CLINIC_OR_DEPARTMENT_OTHER): Payer: Medicare Other | Admitting: Lab

## 2012-05-20 DIAGNOSIS — C9001 Multiple myeloma in remission: Secondary | ICD-10-CM

## 2012-05-20 LAB — CBC WITH DIFFERENTIAL/PLATELET
Basophils Absolute: 0 10*3/uL (ref 0.0–0.1)
EOS%: 1.9 % (ref 0.0–7.0)
HGB: 8.4 g/dL — ABNORMAL LOW (ref 11.6–15.9)
MCH: 31.6 pg (ref 25.1–34.0)
NEUT#: 0.4 10*3/uL — CL (ref 1.5–6.5)
RBC: 2.66 10*6/uL — ABNORMAL LOW (ref 3.70–5.45)
RDW: 18.9 % — ABNORMAL HIGH (ref 11.2–14.5)
lymph#: 1.6 10*3/uL (ref 0.9–3.3)
nRBC: 3 % — ABNORMAL HIGH (ref 0–0)

## 2012-05-23 ENCOUNTER — Telehealth: Payer: Self-pay

## 2012-05-23 ENCOUNTER — Other Ambulatory Visit (HOSPITAL_BASED_OUTPATIENT_CLINIC_OR_DEPARTMENT_OTHER): Payer: Medicare Other

## 2012-05-23 ENCOUNTER — Ambulatory Visit (HOSPITAL_BASED_OUTPATIENT_CLINIC_OR_DEPARTMENT_OTHER): Payer: Medicare Other | Admitting: Oncology

## 2012-05-23 ENCOUNTER — Telehealth: Payer: Self-pay | Admitting: Oncology

## 2012-05-23 VITALS — BP 116/61 | HR 90 | Temp 97.0°F | Resp 20 | Ht 64.0 in | Wt 154.9 lb

## 2012-05-23 DIAGNOSIS — D649 Anemia, unspecified: Secondary | ICD-10-CM

## 2012-05-23 DIAGNOSIS — C9002 Multiple myeloma in relapse: Secondary | ICD-10-CM

## 2012-05-23 DIAGNOSIS — C9000 Multiple myeloma not having achieved remission: Secondary | ICD-10-CM

## 2012-05-23 DIAGNOSIS — C9001 Multiple myeloma in remission: Secondary | ICD-10-CM

## 2012-05-23 DIAGNOSIS — D61818 Other pancytopenia: Secondary | ICD-10-CM

## 2012-05-23 LAB — CBC WITH DIFFERENTIAL/PLATELET
BASO%: 1.2 % (ref 0.0–2.0)
EOS%: 1.7 % (ref 0.0–7.0)
HCT: 26.9 % — ABNORMAL LOW (ref 34.8–46.6)
MCHC: 33.7 g/dL (ref 31.5–36.0)
MONO#: 0.5 10*3/uL (ref 0.1–0.9)
NEUT%: 21.7 % — ABNORMAL LOW (ref 38.4–76.8)
RBC: 2.78 10*6/uL — ABNORMAL LOW (ref 3.70–5.45)
RDW: 20.6 % — ABNORMAL HIGH (ref 11.2–14.5)
WBC: 3.9 10*3/uL (ref 3.9–10.3)
lymph#: 2.4 10*3/uL (ref 0.9–3.3)

## 2012-05-23 LAB — HOLD TUBE, BLOOD BANK

## 2012-05-23 NOTE — Telephone Encounter (Signed)
Gave pt appt for August 2013 labs, September lab, ML and chemo, emailed [place] regarding chemo

## 2012-05-23 NOTE — Telephone Encounter (Signed)
Per staff message and POF I have made appt. TMB

## 2012-05-23 NOTE — Progress Notes (Signed)
[facility]    OFFICE PROGRESS NOTE   INTERVAL HISTORY:   She returns as scheduled. She has an improved appetite and energy level. She is due to begin another cycle of pomalidomide on 05/24/2012. Ms. Martensen continues weekly Decadron. She last had a red cell transfusion on 05/11/2012. No bleeding.  Objective:  Vital signs in last 24 hours:  Blood pressure 116/61, pulse 90, temperature 97 F (36.1 C), temperature source Oral, resp. rate 20, height 5\' 4"  (1.626 m), weight 154 lb 14.4 oz (70.262 kg).    HEENT: No thrush or ulcers, no bleeding Resp: Lungs clear bilaterally Cardio: Regular rate and rhythm GI: No hepatosplenomegaly Vascular: No leg edema   Portacath/PICC-without erythema  Lab Results:  Lab Results  Component Value Date   WBC 3.9 05/23/2012   HGB 9.1* 05/23/2012   HCT 26.9* 05/23/2012   MCV 97.0 05/23/2012   PLT 41* 05/23/2012   ANC 0.8 IgM 2570 on 05/17/2012, 2510 on 04/12/2012, 5510 on 03/18/2012   Medications: I have reviewed the patient's current medications.  Assessment/Plan: 1. Multiple myeloma treated with subcutaneous Velcade and Cytoxan/Decadron on a weekly schedule beginning in May 2011. She completed 1 year of treatment on 01/24/2011. The serum M-spike and IgM level were increased beginning in May 2012. She began treatment with carfilzomib on 05/25/2011. She completed 2 cycles. The serum M-spike and IgM level were higher on 07/12/2011. She began cycle 1 melphalan/prednisone 07/25/2011. The IgM level was improved on 08/21/2011. She began cycle 2 melphalan/prednisone on 08/29/2011. The melphalan dose was reduced to 8 mg daily for 4 days beginning with cycle 2. The melphalan was dose reduced to 6 mg daily for 4 days beginning with the third cycle of melphalan/prednisone on 09/27/2011. She completed a fourth cycle of melphalan/prednisone beginning on 10/25/2011. The IgM level was stable on 11/15/2011. She completed cycle 6 melphalan/prednisone  beginning 12/29/2011. The IgM level was increased on 01/22/2012. She began Pomalidomide on 02/26/2012 with weekly dexamethasone. She began cycle 3 on 04/26/2012. The serum M spike and IgM level were slightly lower on 03/18/2012. The IgM level was improved on 04/12/2012, stable 05/17/2012. 2. Hospitalization with shortness of breath/acute diastolic heart failure 02/26/2012 through 03/11/2012. 3. Hospitalization with dyspnea/hypoxia secondary to volume overload in the setting of diastolic heart failure on 05/26/2011. 4. Hospitalization 06/02/2011 with dyspnea. She was found to have marked anemia with a hemoglobin of 6.7. She received a red cell transfusion 09/27/2011. 5. History of congestive heart failure status post hospital admission 05/26/2011. 6. Thrombocytopenia secondary to multiple myeloma and pomalidomide. Stable. 7. T7 compression fracture status post kyphoplasty 11/23/2007 by Dr. Noel Gerold. 8. Peripheral neuropathy status post evaluation by Dr. Anne Hahn. The neuropathy is most likely related to Velcade therapy. 9. Herpes zoster at the left abdomen and chest wall status post Valtrex therapy. 10. Postherpetic neuralgia. 11. History of neutropenia secondary to Velcade and Cytoxan. There is persistent neutropenia. 12. Pain and swelling of the proximal interphalangeal joint of the right 3rd finger when she was here on 02/21/2011, improved. She was treated with a Medrol Dosepak. 13. Cough, wheezing, dyspnea, and pleuritic right-sided chest pain and here on 01/22/2012. Chest CT showed right upper lobe pneumonia. She completed a 10 day course of Avelox on 01/31/2012. 14. Persistent severe anemia. She last transfused on 05/11/2012. The hemoglobin is stable today. 15. Left wrist pain/tenderness, erythema and edema-02/02/2012,? Gout versus a joint infection. Improved after Keflex and a Medrol Dosepak.  Disposition:  Her overall performance status has improved since  beginning pomalidomide. There is  persistent pancytopenia, but her energy level has improved, the hemoglobin has stabilized over the past few weeks, and the IgM level is lower.  Karen Barron will begin another cycle of pomalidomide on 05/24/2012. She continues weekly Decadron. She will return for an office visit and Zometa 06/20/2012.   Thornton Papas, MD  05/23/2012  5:53 PM

## 2012-05-27 NOTE — Assessment & Plan Note (Signed)
Doing relatively well from a HF perspective. Volume status looks good. She has not been following her weights or using a sliding scale. We reviewed the use of daily weights and sliding scale diuretics. We told her: Keep your weight 153-155 pounds.  If you weight 158 pounds or more take extra lasix 40 mg with potassium.  Follow up 1 month.

## 2012-05-27 NOTE — Progress Notes (Signed)
Referring Physician: Dr. Alwyn Ren Primary Care: Dr. Alwyn Ren Primary Cardiologist: Dr. Daleen Squibb  Weight Range: Baseline proBNP: 4574  HPI: [name] is a 78 y.o. female with history of multiple myeloma (10 years) on chemotherapy, diastolic heart failure, anemia for which she has received numerous blood transfusions.    Echo 5/13: EF 60-65%, grade 2 diastolic dysfunction, Mild MR, mildly dilated LA.  PAPP 57 mmHg  She has been referred by Dr. Alwyn Ren for follow up of diastolic HF with several hospitalizations this year.  She feels pretty good today.  She denies increased dyspnea/orthopnea/PND.  No chest pain.  No fever/chills.  No edema.  She does not weigh herself at home.  She lives with her husband and daughter.   Last blood transfusion was ~3 weeks ago, will go tomorrow    Review of Systems: [y] = yes, [ ]  = no   General: Weight gain [ ] ; Weight loss [ ] ; Anorexia [ ] ; Fatigue [ y]; Fever [ ] ; Chills [ ] ; Weakness Cove.Etienne ]  Cardiac: Chest pain/pressure [ ] ; Resting SOB [ ] ; Exertional SOB [ ] ; Orthopnea [ ] ; Pedal Edema [ ] ; Palpitations [ ] ; Syncope [ ] ; Presyncope [ ] ; Paroxysmal nocturnal dyspnea[ ]   Pulmonary: Cough [ ] ; Wheezing[ ] ; Hemoptysis[ ] ; Sputum [ ] ; Snoring [ ]   GI: Vomiting[ ] ; Dysphagia[ ] ; Melena[ ] ; Hematochezia [ ] ; Heartburn[ ] ; Abdominal pain [ ] ; Constipation [ ] ; Diarrhea [ ] ; BRBPR [ ]   GU: Hematuria[ ] ; Dysuria [ ] ; Nocturia[ ]   Vascular: Pain in legs with walking [ ] ; Pain in feet with lying flat [ ] ; Non-healing sores [ ] ; Stroke [ ] ; TIA [ ] ; Slurred speech [ ] ;  Neuro: Headaches[ ] ; Vertigo[ ] ; Seizures[ ] ; Paresthesias[ ] ;Blurred vision [ ] ; Diplopia [ ] ; Vision changes [ ]   Ortho/Skin: Arthritis Cove.Etienne ]; Joint pain [ ] ; Muscle pain Cove.Etienne ]; Joint swelling [ ] ; Back Pain [ ] ; Rash [ ]   Psych: Depression[ ] ; Anxiety[ ]   Heme: Bleeding problems [ ] ; Clotting disorders [ ] ; Anemia [ y]  Endocrine: Diabetes [ ] ; Thyroid dysfunction[ ]    Past Medical History  Diagnosis Date    . Personal history of colonic polyps   . Osteoporosis   . Waldenstrom's macroglobulinemia   . Diverticular disease   . Thyroid disease   . Fatty liver   . Hypertension   . Diabetes in pregnancy   . Hyperlipidemia   . Spinal stenosis   . Asthma   . Anemia   . DJD (degenerative joint disease)   . Esophageal reflux   . Hiatal hernia   . Chronic diastolic CHF (congestive heart failure)     Echo on 05/30/11 revealed nl LV systolic function, EF 65-70%, no WMAs, grade 2 diastolic dysfunction, mildly dilated RA/RV, peak PA pressure  . Multiple myeloma in remission   . CHF (congestive heart failure) 5/20-03/11/12    Current Outpatient Prescriptions  Medication Sig Dispense Refill  . albuterol (PROVENTIL HFA;VENTOLIN HFA) 108 (90 BASE) MCG/ACT inhaler Inhale 2 puffs into the lungs every 6 (six) hours as needed. For shortness of breath  1 Inhaler  11  . amitriptyline (ELAVIL) 10 MG tablet Take 2 tablets (20 mg total) by mouth at bedtime.  30 tablet  0  . dexamethasone (DECADRON) 4 MG tablet Take 40 mg by mouth once a week. On Saturday.      . furosemide (LASIX) 40 MG tablet Take 1 tablet (40 mg total) by mouth 2 (two) times  daily.  30 tablet  0  . levothyroxine (SYNTHROID, LEVOTHROID) 75 MCG tablet One pill daily except for one half pill every Wednesday  90 tablet  1  . prochlorperazine (COMPAZINE) 10 MG tablet Take 10 mg by mouth every 6 (six) hours as needed. For nausea      . simvastatin (ZOCOR) 20 MG tablet Take 10 mg by mouth at bedtime.      . diphenoxylate-atropine (LOMOTIL) 2.5-0.025 MG per tablet Take 1 tablet by mouth 4 (four) times daily as needed.      Marland Kitchen esomeprazole (NEXIUM) 20 MG capsule Take 1 capsule (20 mg total) by mouth daily before breakfast.  90 capsule  0  . lidocaine-prilocaine (EMLA) cream 1 application as needed.      . metoprolol succinate (TOPROL-XL) 50 MG 24 hr tablet Take 1 tablet (50 mg total) by mouth daily. Take with or immediately following a meal.  90  tablet  0  . pomalidomide (POMALYST) 4 MG capsule Take 1 capsule (4 mg total) by mouth daily. for 21 days; then 7 days off  21 capsule  0  . potassium chloride SA (K-DUR,KLOR-CON) 20 MEQ tablet Take 1 tablet (20 mEq total) by mouth 2 (two) times daily.  90 tablet  0    Allergies  Allergen Reactions  . Cladribine Other (See Comments)  . Codeine Other (See Comments)    don't remember   . Guaifenesin & Derivatives Other (See Comments)    Elevates BP  . Indomethacin Other (See Comments)    Does not remember reaction   . Naproxen Sodium Other (See Comments)    Does not remember reaction   . Risedronate Sodium     History   Social History  . Marital Status: Married    Spouse Name: N/A    Number of Children: 2  . Years of Education: N/A   Occupational History  . Retired     Social History Main Topics  . Smoking status: Never Smoker   . Smokeless tobacco: Never Used  . Alcohol Use: No  . Drug Use: No  . Sexually Active: Not Currently   Other Topics Concern  . Not on file   Social History Narrative   No caffeine drinks     Family History  Problem Relation Age of Onset  . Leukemia Father   . Osteoporosis Mother   . Cancer Sister     CNS  . Melanoma Sister     PHYSICAL EXAM: Filed Vitals:   05/09/12 0949  BP: 110/68  Pulse: 84  Height: 5\' 4"  (1.626 m)  Weight: 155 lb 12 oz (70.648 kg)  SpO2: 100%    General:  Well appearing. No respiratory difficulty HEENT: normal Neck: supple. JVP 5-6. Carotids 2+ bilat; no bruits. No lymphadenopathy or thryomegaly appreciated. Cor: PMI nondisplaced. Regular rate & rhythm. No rubs, gallops or murmurs. Lungs: clear Abdomen: soft, nontender, nondistended. No hepatosplenomegaly. No bruits or masses. Good bowel sounds. Extremities: no cyanosis, clubbing, rash, edema Neuro: alert & oriented x 3, cranial nerves grossly intact. moves all 4 extremities w/o difficulty. Affect pleasant.   ASSESSMENT & PLAN:

## 2012-05-28 ENCOUNTER — Telehealth: Payer: Self-pay | Admitting: Oncology

## 2012-05-28 NOTE — Telephone Encounter (Signed)
Talked to pt, she has an updated appt calendar for August and September 2013

## 2012-05-29 ENCOUNTER — Telehealth: Payer: Self-pay | Admitting: *Deleted

## 2012-05-29 ENCOUNTER — Other Ambulatory Visit: Payer: Self-pay

## 2012-05-29 MED ORDER — AMBULATORY NON FORMULARY MEDICATION: Status: AC

## 2012-05-29 NOTE — Telephone Encounter (Signed)
Received call from pt husband and daughter explaining that pt has lab appt at Dr. Frederik Pear office [phone] Friday and also [facility] lab appt 0915 Friday.  Questioned whether all of this can be done at one appt.  Per Dr. Truett Perna, have pt bring in order from Dr. Frederik Pear office to our lab and everything can be drawn at one time and lab will send results to Dr. Frederik Pear office.  Returned call to pt's husband and explained information.  Mr. Pigeon was able to repeat back the instructions given and verbalized understanding.

## 2012-05-31 ENCOUNTER — Other Ambulatory Visit (HOSPITAL_BASED_OUTPATIENT_CLINIC_OR_DEPARTMENT_OTHER): Payer: Medicare Other | Admitting: Lab

## 2012-05-31 ENCOUNTER — Other Ambulatory Visit: Payer: Medicare Other

## 2012-05-31 DIAGNOSIS — C9001 Multiple myeloma in remission: Secondary | ICD-10-CM

## 2012-05-31 DIAGNOSIS — C9000 Multiple myeloma not having achieved remission: Secondary | ICD-10-CM

## 2012-05-31 LAB — CBC WITH DIFFERENTIAL/PLATELET
Eosinophils Absolute: 0.1 10*3/uL (ref 0.0–0.5)
LYMPH%: 44.4 % (ref 14.0–49.7)
MONO#: 0.2 10*3/uL (ref 0.1–0.9)
NEUT#: 1.9 10*3/uL (ref 1.5–6.5)
Platelets: 55 10*3/uL — ABNORMAL LOW (ref 145–400)
RBC: 2.42 10*6/uL — ABNORMAL LOW (ref 3.70–5.45)
WBC: 4 10*3/uL (ref 3.9–10.3)

## 2012-06-03 ENCOUNTER — Encounter (HOSPITAL_COMMUNITY)
Admission: RE | Admit: 2012-06-03 | Discharge: 2012-06-03 | Disposition: A | Discharge status: Home / Self Care | Payer: Medicare Other | Source: Ambulatory Visit | Attending: Oncology | Admitting: Oncology

## 2012-06-03 ENCOUNTER — Ambulatory Visit: Payer: Medicare Other

## 2012-06-03 ENCOUNTER — Telehealth: Payer: Self-pay | Admitting: *Deleted

## 2012-06-03 ENCOUNTER — Ambulatory Visit (HOSPITAL_BASED_OUTPATIENT_CLINIC_OR_DEPARTMENT_OTHER): Payer: Medicare Other | Admitting: Oncology

## 2012-06-03 ENCOUNTER — Ambulatory Visit (HOSPITAL_COMMUNITY)
Admission: RE | Admit: 2012-06-03 | Discharge: 2012-06-03 | Disposition: A | Discharge status: Home / Self Care | Payer: Medicare Other | Source: Ambulatory Visit | Attending: Oncology | Admitting: Oncology

## 2012-06-03 ENCOUNTER — Other Ambulatory Visit: Payer: Self-pay | Admitting: *Deleted

## 2012-06-03 ENCOUNTER — Ambulatory Visit (HOSPITAL_BASED_OUTPATIENT_CLINIC_OR_DEPARTMENT_OTHER): Payer: Medicare Other

## 2012-06-03 VITALS — BP 101/54 | HR 83 | Temp 98.8°F | Resp 18

## 2012-06-03 VITALS — BP 100/48 | HR 90 | Temp 100.4°F | Resp 20 | Ht 64.0 in | Wt 157.1 lb

## 2012-06-03 DIAGNOSIS — C9002 Multiple myeloma in relapse: Secondary | ICD-10-CM

## 2012-06-03 DIAGNOSIS — R509 Fever, unspecified: Secondary | ICD-10-CM

## 2012-06-03 DIAGNOSIS — R0602 Shortness of breath: Secondary | ICD-10-CM | POA: Insufficient documentation

## 2012-06-03 DIAGNOSIS — C9001 Multiple myeloma in remission: Secondary | ICD-10-CM

## 2012-06-03 DIAGNOSIS — D649 Anemia, unspecified: Secondary | ICD-10-CM

## 2012-06-03 LAB — CBC WITH DIFFERENTIAL/PLATELET
Basophils Absolute: 0 10*3/uL (ref 0.0–0.1)
Eosinophils Absolute: 0 10*3/uL (ref 0.0–0.5)
HCT: 19.8 % — ABNORMAL LOW (ref 34.8–46.6)
LYMPH%: 22.1 % (ref 14.0–49.7)
MONO#: 0.5 10*3/uL (ref 0.1–0.9)
NEUT#: 2.6 10*3/uL (ref 1.5–6.5)
NEUT%: 63.8 % (ref 38.4–76.8)
Platelets: 48 10*3/uL — ABNORMAL LOW (ref 145–400)
WBC: 4 10*3/uL (ref 3.9–10.3)

## 2012-06-03 LAB — URINALYSIS, MICROSCOPIC - CHCC
Blood: NEGATIVE
Nitrite: NEGATIVE
Protein: NEGATIVE mg/dL
Specific Gravity, Urine: 1.015 (ref 1.003–1.035)

## 2012-06-03 MED ORDER — LEVOFLOXACIN 500 MG PO TABS: 500.0000 mg | ORAL_TABLET | Freq: Every day | ORAL | Status: DC

## 2012-06-03 NOTE — Telephone Encounter (Signed)
Message from pt's husband reporting temp 101.9 and shaking chills. Returned call to pt, she reports she felt nauseated last night- took Zantac then [employer]. She woke up in the middle of the night with shaking chills, had one episode of urinary incontinence, feels dizzy. Pt dyspneic during our conversation. Reviewed with Dr. Truett Perna: order received for lab and office visit.

## 2012-06-03 NOTE — Patient Instructions (Signed)
[name] 12-18-1925 [id number]  [facility] Discharge Instructions  Your exam findings, labs and results were discussed with your MD today.  Filed Vitals:   06/03/12 1317  BP:   Pulse:   Temp: 100.4 F (38 C)  Resp:     Current Outpatient Prescriptions on File Prior to Visit  Medication Sig Dispense Refill  . albuterol (PROVENTIL HFA;VENTOLIN HFA) 108 (90 BASE) MCG/ACT inhaler Inhale 2 puffs into the lungs every 6 (six) hours as needed. For shortness of breath  1 Inhaler  11  . amitriptyline (ELAVIL) 10 MG tablet Take 2 tablets (20 mg total) by mouth at bedtime.  30 tablet  0  . dexamethasone (DECADRON) 4 MG tablet Take 40 mg by mouth once a week. On Saturday.      . diphenoxylate-atropine (LOMOTIL) 2.5-0.025 MG per tablet Take 1 tablet by mouth 4 (four) times daily as needed.      Marland Kitchen esomeprazole (NEXIUM) 20 MG capsule Take 1 capsule (20 mg total) by mouth daily before breakfast.  90 capsule  0  . furosemide (LASIX) 40 MG tablet Take 1 tablet (40 mg total) by mouth 2 (two) times daily.  30 tablet  0  . levothyroxine (SYNTHROID, LEVOTHROID) 75 MCG tablet One pill daily except for one half pill every Wednesday  90 tablet  1  . lidocaine-prilocaine (EMLA) cream 1 application as needed.      . metoprolol succinate (TOPROL-XL) 50 MG 24 hr tablet Take 1 tablet (50 mg total) by mouth daily. Take with or immediately following a meal.  90 tablet  0  . pomalidomide (POMALYST) 4 MG capsule Take 1 capsule (4 mg total) by mouth daily. for 21 days; then 7 days off  21 capsule  0  . potassium chloride SA (K-DUR,KLOR-CON) 20 MEQ tablet Take 1 tablet (20 mEq total) by mouth 2 (two) times daily.  90 tablet  0  . prochlorperazine (COMPAZINE) 10 MG tablet Take 10 mg by mouth every 6 (six) hours as needed. For nausea      . simvastatin (ZOCOR) 20 MG tablet Take 10 mg by mouth at bedtime.      . AMBULATORY NON FORMULARY MEDICATION Lab Order (FASTING): Lipid/Hep/TSH  DX:  272.4/995.20/244.9/244.9  1 each  0    Please visit scheduling to obtain calendar for future appointments.  Please call the [facility] at [phone] during business hours should you have any further questions or need assistance in obtaining follow-up care. If you have a medical emergency, please dial 911.  Special Instructions:

## 2012-06-03 NOTE — Progress Notes (Signed)
Charted Transfusion end time of 1801. The times in the Doc Flowsheets are not consistent with the policy. Will remind Santina Evans to correct the Blood Vitals on 06/04/2012.

## 2012-06-03 NOTE — Patient Instructions (Addendum)
Blood Transfusion Information  WHAT IS A BLOOD TRANSFUSION?  A transfusion is the replacement of blood or some of its parts. Blood is made up of multiple cells which provide different functions.   Red blood cells carry oxygen and are used for blood loss replacement.   White blood cells fight against infection.   Platelets control bleeding.   Plasma helps clot blood.   Other blood products are available for specialized needs, such as hemophilia or other clotting disorders.  BEFORE THE TRANSFUSION   Who gives blood for transfusions?    You may be able to donate blood to be used at a later date on yourself (autologous donation).   Relatives can be asked to donate blood. This is generally not any safer than if you have received blood from a stranger. The same precautions are taken to ensure safety when a relative's blood is donated.   Healthy volunteers who are fully evaluated to make sure their blood is safe. This is blood bank blood.  Transfusion therapy is the safest it has ever been in the practice of medicine. Before blood is taken from a donor, a complete history is taken to make sure that person has no history of diseases nor engages in risky social behavior (examples are intravenous drug use or sexual activity with multiple partners). The donor's travel history is screened to minimize risk of transmitting infections, such as malaria. The donated blood is tested for signs of infectious diseases, such as HIV and hepatitis. The blood is then tested to be sure it is compatible with you in order to minimize the chance of a transfusion reaction. If you or a relative donates blood, this is often done in anticipation of surgery and is not appropriate for emergency situations. It takes many days to process the donated blood.  RISKS AND COMPLICATIONS  Although transfusion therapy is very safe and saves many lives, the main dangers of transfusion include:    Getting an infectious disease.   Developing a  transfusion reaction. This is an allergic reaction to something in the blood you were given. Every precaution is taken to prevent this.  The decision to have a blood transfusion has been considered carefully by your caregiver before blood is given. Blood is not given unless the benefits outweigh the risks.  AFTER THE TRANSFUSION   Right after receiving a blood transfusion, you will usually feel much better and more energetic. This is especially true if your red blood cells have gotten low (anemic). The transfusion raises the level of the red blood cells which carry oxygen, and this usually causes an energy increase.   The nurse administering the transfusion will monitor you carefully for complications.  HOME CARE INSTRUCTIONS   No special instructions are needed after a transfusion. You may find your energy is better. Speak with your caregiver about any limitations on activity for underlying diseases you may have.  SEEK MEDICAL CARE IF:    Your condition is not improving after your transfusion.   You develop redness or irritation at the intravenous (IV) site.  SEEK IMMEDIATE MEDICAL CARE IF:   Any of the following symptoms occur over the next 12 hours:   Shaking chills.   You have a temperature by mouth above 102 F (38.9 C), not controlled by medicine.   Chest, back, or muscle pain.   People around you feel you are not acting correctly or are confused.   Shortness of breath or difficulty breathing.   Dizziness and fainting.     You get a rash or develop hives.   You have a decrease in urine output.   Your urine turns a dark color or changes to pink, red, or brown.  Any of the following symptoms occur over the next 10 days:   You have a temperature by mouth above 102 F (38.9 C), not controlled by medicine.   Shortness of breath.   Weakness after normal activity.   The white part of the eye turns yellow (jaundice).   You have a decrease in the amount of urine or are urinating less often.   Your  urine turns a dark color or changes to pink, red, or brown.  Document Released: 09/22/2000 Document Revised: 09/14/2011 Document Reviewed: 05/11/2008  ExitCare Patient Information 2012 [employer].

## 2012-06-03 NOTE — Progress Notes (Signed)
Karen Barron    OFFICE PROGRESS NOTE   INTERVAL HISTORY:   Karen Barron returns prior to a scheduled visit. Karen Barron developed shaking chills with a temperature of 101.9 this morning. Karen Barron felt well prior to today. No dysuria, diarrhea, or cough. No discomfort at the Port-A-Cath site. Karen Barron began another cycle of pomalidomide on 05/24/2012.  Objective:  Vital signs in last 24 hours:  Blood pressure 100/48, pulse 90, temperature 100.4 F (38 C), temperature source Oral, resp. rate 20, height 5\' 4"  (1.626 m), weight 157 lb 1.6 oz (71.26 kg).    HEENT: No thrush or ulcers. There is a 1-2 mm petechiae at the right buccal mucosa Resp: Lungs clear bilaterally Cardio: Regular rate and rhythm GI: Mild tenderness in the upper abdomen, no hepatosplenomegaly, bowel sounds are present Vascular: No leg edema  Portacath/PICC-without erythema  Lab Results:  Lab Results  Component Value Date   WBC 4.0 06/03/2012   HGB 6.5* 06/03/2012   HCT 19.8* 06/03/2012   MCV 98.5 06/03/2012   PLT 48* 06/03/2012   ANC 2.6    Medications: I have reviewed the patient's current medications.  Assessment/Plan: 1. Multiple myeloma treated with subcutaneous Velcade and Cytoxan/Decadron on a weekly schedule beginning in May 2011. Karen Barron completed 1 year of treatment on 01/24/2011. The serum M-spike and IgM level were increased beginning in May 2012. Karen Barron began treatment with carfilzomib on 05/25/2011. Karen Barron completed 2 cycles. The serum M-spike and IgM level were higher on 07/12/2011. Karen Barron began cycle 1 melphalan/prednisone 07/25/2011. The IgM level was improved on 08/21/2011. Karen Barron began cycle 2 melphalan/prednisone on 08/29/2011. The melphalan dose was reduced to 8 mg daily for 4 days beginning with cycle 2. The melphalan was dose reduced to 6 mg daily for 4 days beginning with the third cycle of melphalan/prednisone on 09/27/2011. Karen Barron completed a fourth cycle of melphalan/prednisone beginning on 10/25/2011. The IgM level  was stable on 11/15/2011. Karen Barron completed cycle 6 melphalan/prednisone beginning 12/29/2011. The IgM level was increased on 01/22/2012. Karen Barron began Pomalidomide on 02/26/2012 with weekly dexamethasone. Karen Barron began cycle 3 on 04/26/2012. The serum M spike and IgM level were slightly lower on 03/18/2012. The IgM level was improved on 04/12/2012, stable 05/17/2012. 2. Hospitalization with shortness of breath/acute diastolic heart failure 02/26/2012 through 03/11/2012. 3. Hospitalization with dyspnea/hypoxia secondary to volume overload in the setting of diastolic heart failure on 05/26/2011. 4. Hospitalization 06/02/2011 with dyspnea. Karen Barron was found to have marked anemia with a hemoglobin of 6.7. Karen Barron received a red cell transfusion 09/27/2011. 5. History of congestive heart failure status post hospital admission 05/26/2011. 6. Thrombocytopenia secondary to multiple myeloma and pomalidomide. Stable. 7. T7 compression fracture status post kyphoplasty 11/23/2007 by Dr. Noel Gerold. 8. Peripheral neuropathy status post evaluation by Dr. Anne Hahn. The neuropathy is most likely related to Velcade therapy. 9. Herpes zoster at the left abdomen and chest wall status post Valtrex therapy. 10. Postherpetic neuralgia. 11. History of neutropenia secondary to Velcade and Cytoxan. There is persistent neutropenia. 12. Pain and swelling of the proximal interphalangeal joint of the right 3rd finger when Karen Barron was here on 02/21/2011, improved. Karen Barron was treated with a Medrol Dosepak. 13. Cough, wheezing, dyspnea, and pleuritic right-sided chest pain and here on 01/22/2012. Chest CT showed right upper lobe pneumonia. Karen Barron completed a 10 day course of Avelox on 01/31/2012. 14. Persistent severe anemia. Karen Barron last transfused on 05/11/2012. The hemoglobin is lower today. We will arrange for a red cell transfusion. 15. Left wrist pain/tenderness, erythema and edema-02/02/2012,? Gout  versus a joint infection. Improved after Keflex and a Medrol  Dosepak. 16. Fever/chills-I am concerned Karen Barron has an infection. Karen Barron is immunocompromised and the neutrophil count is generally low.  Disposition:  Karen Barron presents with a fever. No apparent source for infection upon review of her history and exam today. We will obtain a urinalysis, chest x-ray, and blood culture from the Port-A-Cath site. Karen Barron will start Levaquin. Karen Barron will be scheduled for one unit of packed red blood cells today and another unit on 06/04/2012. Karen Barron continues pomalidomide. We will see her prior to the next scheduled office visit for a persistent fever.   Thornton Papas, MD  06/03/2012  3:38 PM

## 2012-06-04 ENCOUNTER — Ambulatory Visit (HOSPITAL_BASED_OUTPATIENT_CLINIC_OR_DEPARTMENT_OTHER): Payer: Medicare Other

## 2012-06-04 VITALS — BP 107/70 | HR 76 | Temp 97.5°F | Resp 18

## 2012-06-04 DIAGNOSIS — D649 Anemia, unspecified: Secondary | ICD-10-CM

## 2012-06-04 MED ORDER — SODIUM CHLORIDE 0.9 % IJ SOLN
10.0000 mL | INTRAMUSCULAR | Status: AC | PRN
  Administered 2012-06-04: 10 mL
  Filled 2012-06-04: qty 10

## 2012-06-04 MED ORDER — SODIUM CHLORIDE 0.9 % IV SOLN
250.0000 mL | Freq: Once | INTRAVENOUS | Status: AC
  Administered 2012-06-04: 250 mL via INTRAVENOUS

## 2012-06-04 MED ORDER — HEPARIN SOD (PORK) LOCK FLUSH 100 UNIT/ML IV SOLN
500.0000 [IU] | Freq: Every day | INTRAVENOUS | Status: AC | PRN
  Administered 2012-06-04: 500 [IU]
  Filled 2012-06-04: qty 5

## 2012-06-04 NOTE — Patient Instructions (Signed)
Blood Transfusion Information  WHAT IS A BLOOD TRANSFUSION?  A transfusion is the replacement of blood or some of its parts. Blood is made up of multiple cells which provide different functions.   Red blood cells carry oxygen and are used for blood loss replacement.   White blood cells fight against infection.   Platelets control bleeding.   Plasma helps clot blood.   Other blood products are available for specialized needs, such as hemophilia or other clotting disorders.  BEFORE THE TRANSFUSION   Who gives blood for transfusions?    You may be able to donate blood to be used at a later date on yourself (autologous donation).   Relatives can be asked to donate blood. This is generally not any safer than if you have received blood from a stranger. The same precautions are taken to ensure safety when a relative's blood is donated.   Healthy volunteers who are fully evaluated to make sure their blood is safe. This is blood bank blood.  Transfusion therapy is the safest it has ever been in the practice of medicine. Before blood is taken from a donor, a complete history is taken to make sure that person has no history of diseases nor engages in risky social behavior (examples are intravenous drug use or sexual activity with multiple partners). The donor's travel history is screened to minimize risk of transmitting infections, such as malaria. The donated blood is tested for signs of infectious diseases, such as HIV and hepatitis. The blood is then tested to be sure it is compatible with you in order to minimize the chance of a transfusion reaction. If you or a relative donates blood, this is often done in anticipation of surgery and is not appropriate for emergency situations. It takes many days to process the donated blood.  RISKS AND COMPLICATIONS  Although transfusion therapy is very safe and saves many lives, the main dangers of transfusion include:    Getting an infectious disease.   Developing a  transfusion reaction. This is an allergic reaction to something in the blood you were given. Every precaution is taken to prevent this.  The decision to have a blood transfusion has been considered carefully by your caregiver before blood is given. Blood is not given unless the benefits outweigh the risks.  AFTER THE TRANSFUSION   Right after receiving a blood transfusion, you will usually feel much better and more energetic. This is especially true if your red blood cells have gotten low (anemic). The transfusion raises the level of the red blood cells which carry oxygen, and this usually causes an energy increase.   The nurse administering the transfusion will monitor you carefully for complications.  HOME CARE INSTRUCTIONS   No special instructions are needed after a transfusion. You may find your energy is better. Speak with your caregiver about any limitations on activity for underlying diseases you may have.  SEEK MEDICAL CARE IF:    Your condition is not improving after your transfusion.   You develop redness or irritation at the intravenous (IV) site.  SEEK IMMEDIATE MEDICAL CARE IF:   Any of the following symptoms occur over the next 12 hours:   Shaking chills.   You have a temperature by mouth above 102 F (38.9 C), not controlled by medicine.   Chest, back, or muscle pain.   People around you feel you are not acting correctly or are confused.   Shortness of breath or difficulty breathing.   Dizziness and fainting.     You get a rash or develop hives.   You have a decrease in urine output.   Your urine turns a dark color or changes to pink, red, or brown.  Any of the following symptoms occur over the next 10 days:   You have a temperature by mouth above 102 F (38.9 C), not controlled by medicine.   Shortness of breath.   Weakness after normal activity.   The white part of the eye turns yellow (jaundice).   You have a decrease in the amount of urine or are urinating less often.   Your  urine turns a dark color or changes to pink, red, or brown.  Document Released: 09/22/2000 Document Revised: 09/14/2011 Document Reviewed: 05/11/2008  [employer] Patient Information 2012 [employer].

## 2012-06-05 LAB — TYPE AND SCREEN
ABO/RH(D): A POS
Unit division: 0

## 2012-06-06 ENCOUNTER — Other Ambulatory Visit: Payer: Self-pay | Admitting: *Deleted

## 2012-06-06 MED ORDER — AMITRIPTYLINE HCL 10 MG PO TABS: 20.0000 mg | ORAL_TABLET | Freq: Every day | ORAL | Status: AC

## 2012-06-06 NOTE — Telephone Encounter (Signed)
OK # [phone] 

## 2012-06-06 NOTE — Telephone Encounter (Signed)
Last OV 03-19-12, last filled 02-07-12 #180

## 2012-06-06 NOTE — Telephone Encounter (Signed)
RX sent

## 2012-06-07 ENCOUNTER — Encounter: Payer: Self-pay | Admitting: Internal Medicine

## 2012-06-07 ENCOUNTER — Other Ambulatory Visit (HOSPITAL_BASED_OUTPATIENT_CLINIC_OR_DEPARTMENT_OTHER): Payer: Medicare Other | Admitting: Lab

## 2012-06-07 DIAGNOSIS — C9001 Multiple myeloma in remission: Secondary | ICD-10-CM

## 2012-06-07 LAB — CBC WITH DIFFERENTIAL/PLATELET
Basophils Absolute: 0.1 10*3/uL (ref 0.0–0.1)
Eosinophils Absolute: 0.1 10*3/uL (ref 0.0–0.5)
HCT: 28.1 % — ABNORMAL LOW (ref 34.8–46.6)
LYMPH%: 44.8 % (ref 14.0–49.7)
MCV: 95.3 fL (ref 79.5–101.0)
MONO#: 0.4 10*3/uL (ref 0.1–0.9)
NEUT#: 1 10*3/uL — ABNORMAL LOW (ref 1.5–6.5)
NEUT%: 35.4 % — ABNORMAL LOW (ref 38.4–76.8)
Platelets: 40 10*3/uL — ABNORMAL LOW (ref 145–400)
WBC: 2.8 10*3/uL — ABNORMAL LOW (ref 3.9–10.3)

## 2012-06-11 ENCOUNTER — Other Ambulatory Visit: Payer: Self-pay | Admitting: *Deleted

## 2012-06-11 ENCOUNTER — Ambulatory Visit (HOSPITAL_COMMUNITY)
Admission: RE | Admit: 2012-06-11 | Discharge: 2012-06-11 | Disposition: A | Discharge status: Home / Self Care | Payer: Medicare Other | Source: Ambulatory Visit | Attending: Internal Medicine | Admitting: Internal Medicine

## 2012-06-11 VITALS — BP 118/56 | HR 78 | Wt 154.8 lb

## 2012-06-11 DIAGNOSIS — I5032 Chronic diastolic (congestive) heart failure: Secondary | ICD-10-CM

## 2012-06-11 MED ORDER — DEXAMETHASONE 4 MG PO TABS: 20.0000 mg | ORAL_TABLET | ORAL | Status: DC

## 2012-06-11 NOTE — Assessment & Plan Note (Signed)
Volume status stable. Continue current diuretic regimen. Instructed to take an additional lasix 40 mg if her weight is 159 pounds or greater in a 24 hour period. Follow up in 2 months.

## 2012-06-11 NOTE — Patient Instructions (Addendum)
Follow up in 2 months  If your weight is 159 pounds or greater take an extra lasix 40 mg.  Do the following things EVERYDAY: 1) Weigh yourself in the morning before breakfast. Write it down and keep it in a log. 2) Take your medicines as prescribed 3) Eat low salt foods-Limit salt (sodium) to 2000 mg per day.  4) Stay as active as you can everyday 5) Limit all fluids for the day to less than 2 liters

## 2012-06-11 NOTE — Progress Notes (Signed)
Patient ID: Karen Barron, female   DOB: 1926-03-23, 78 y.o.   MRN: [id number] Referring Physician: Dr. Alwyn Ren Primary Care: Dr. Alwyn Ren Primary Cardiologist: Dr. Daleen Squibb  Weight Range: 155 pounds.  Baseline proBNP: 4574  HPI: Karen Barron is a 78 y.o. female with history of multiple myeloma (10 years) on chemotherapy, diastolic heart failure, anemia for which she has received numerous blood transfusions.    Echo 5/13: EF 60-65%, grade 2 diastolic dysfunction, Mild MR, mildly dilated LA.  PAPP 57 mmHg  She returns for follow up with her husband.  Complains of fatigue. Denies SOB/PND/Orthopnea.She has received 2 UPRBC over the last 2 weeks. Hemoglobin post transfusion up to 9.3 (6).  Compliant with medications. Weight at home 154-156 pounds.   Review of Systems: [y] = yes, [ ]  = no   General: Weight gain [ ] ; Weight loss [ ] ; Anorexia [ ] ; Fatigue [ y]; Fever [ ] ; Chills [ ] ; Weakness Cove.Etienne ]  Cardiac: Chest pain/pressure [ ] ; Resting SOB [ ] ; Exertional SOB [ ] ; Orthopnea [ ] ; Pedal Edema [ ] ; Palpitations [ ] ; Syncope [ ] ; Presyncope [ ] ; Paroxysmal nocturnal dyspnea[ ]   Pulmonary: Cough [ ] ; Wheezing[ ] ; Hemoptysis[ ] ; Sputum [ ] ; Snoring [ ]   GI: Vomiting[ ] ; Dysphagia[ ] ; Melena[ ] ; Hematochezia [ ] ; Heartburn[ ] ; Abdominal pain [ ] ; Constipation [ ] ; Diarrhea [ ] ; BRBPR [ ]   GU: Hematuria[ ] ; Dysuria [ ] ; Nocturia[ ]   Vascular: Pain in legs with walking [ ] ; Pain in feet with lying flat [ ] ; Non-healing sores [ ] ; Stroke [ ] ; TIA [ ] ; Slurred speech [ ] ;  Neuro: Headaches[ ] ; Vertigo[ ] ; Seizures[ ] ; Paresthesias[ ] ;Blurred vision [ ] ; Diplopia [ ] ; Vision changes [ ]   Ortho/Skin: Arthritis Cove.Etienne ]; Joint pain [ ] ; Muscle pain Cove.Etienne ]; Joint swelling [ ] ; Back Pain [ ] ; Rash [ ]   Psych: Depression[ ] ; Anxiety[ ]   Heme: Bleeding problems [ ] ; Clotting disorders [ ] ; Anemia [ y]  Endocrine: Diabetes [ ] ; Thyroid dysfunction[ ]    Past Medical History  Diagnosis Date  . Personal history of colonic  polyps   . Osteoporosis   . Waldenstrom's macroglobulinemia   . Diverticular disease   . Thyroid disease   . Fatty liver   . Hypertension   . Diabetes in pregnancy   . Hyperlipidemia   . Spinal stenosis   . Asthma   . Anemia   . DJD (degenerative joint disease)   . Esophageal reflux   . Hiatal hernia   . Chronic diastolic CHF (congestive heart failure)     Echo on 05/30/11 revealed nl LV systolic function, EF 65-70%, no WMAs, grade 2 diastolic dysfunction, mildly dilated RA/RV, peak PA pressure  . Multiple myeloma in remission   . CHF (congestive heart failure) 5/20-03/11/12    Current Outpatient Prescriptions  Medication Sig Dispense Refill  . albuterol (PROVENTIL HFA;VENTOLIN HFA) 108 (90 BASE) MCG/ACT inhaler Inhale 2 puffs into the lungs every 6 (six) hours as needed. For shortness of breath  1 Inhaler  11  . AMBULATORY NON FORMULARY MEDICATION Lab Order (FASTING): Lipid/Hep/TSH  DX: 272.4/995.20/244.9/244.9  1 each  0  . amitriptyline (ELAVIL) 10 MG tablet Take 2 tablets (20 mg total) by mouth at bedtime.  180 tablet  0  . dexamethasone (DECADRON) 4 MG tablet Take 40 mg by mouth once a week. On Saturday.      . diphenoxylate-atropine (LOMOTIL) 2.5-0.025 MG per tablet Take 1 tablet  by mouth 4 (four) times daily as needed.      Marland Kitchen esomeprazole (NEXIUM) 20 MG capsule Take 1 capsule (20 mg total) by mouth daily before breakfast.  90 capsule  0  . furosemide (LASIX) 40 MG tablet Take 1 tablet (40 mg total) by mouth 2 (two) times daily.  30 tablet  0  . levofloxacin (LEVAQUIN) 500 MG tablet Take 1 tablet (500 mg total) by mouth daily.  7 tablet  0  . levothyroxine (SYNTHROID, LEVOTHROID) 75 MCG tablet One pill daily except for one half pill every Wednesday  90 tablet  1  . lidocaine-prilocaine (EMLA) cream 1 application as needed.      . metoprolol succinate (TOPROL-XL) 50 MG 24 hr tablet Take 1 tablet (50 mg total) by mouth daily. Take with or immediately following a meal.  90  tablet  0  . pomalidomide (POMALYST) 4 MG capsule Take 1 capsule (4 mg total) by mouth daily. for 21 days; then 7 days off  21 capsule  0  . potassium chloride SA (K-DUR,KLOR-CON) 20 MEQ tablet Take 1 tablet (20 mEq total) by mouth 2 (two) times daily.  90 tablet  0  . prochlorperazine (COMPAZINE) 10 MG tablet Take 10 mg by mouth every 6 (six) hours as needed. For nausea      . simvastatin (ZOCOR) 20 MG tablet Take 10 mg by mouth at bedtime.        Allergies  Allergen Reactions  . Cladribine Other (See Comments)  . Codeine Other (See Comments)    don't remember   . Guaifenesin & Derivatives Other (See Comments)    Elevates BP  . Indomethacin Other (See Comments)    Does not remember reaction   . Naproxen Sodium Other (See Comments)    Does not remember reaction   . Risedronate Sodium     History   Social History  . Marital Status: Married    Spouse Name: N/A    Number of Children: 2  . Years of Education: N/A   Occupational History  . Retired     Social History Main Topics  . Smoking status: Never Smoker   . Smokeless tobacco: Never Used  . Alcohol Use: No  . Drug Use: No  . Sexually Active: Not Currently   Other Topics Concern  . Not on file   Social History Narrative   No caffeine drinks     Family History  Problem Relation Age of Onset  . Leukemia Father   . Osteoporosis Mother   . Cancer Sister     CNS  . Melanoma Sister     PHYSICAL EXAM: Filed Vitals:   06/11/12 0955  BP: 118/56  Pulse: 78  Weight: 154 lb 12 oz (70.194 kg)  SpO2: 98%    General:  Well appearing. No respiratory difficulty Husband present HEENT: normal Neck: supple. JVP 5-6. Carotids 2+ bilat; no bruits. No lymphadenopathy or thryomegaly appreciated. Cor: PMI nondisplaced. Regular rate & rhythm. No rubs, gallops or murmurs. Lungs: clear Abdomen: soft, nontender, nondistended. No hepatosplenomegaly. No bruits or masses. Good bowel sounds. Extremities: no cyanosis, clubbing,  rash, trace edema Neuro: alert & oriented x 3, cranial nerves grossly intact. moves all 4 extremities w/o difficulty. Affect pleasant.   ASSESSMENT & PLAN:

## 2012-06-12 ENCOUNTER — Other Ambulatory Visit: Payer: Self-pay | Admitting: *Deleted

## 2012-06-12 NOTE — Telephone Encounter (Signed)
Received call from pt requesting Pomalyst re-fill 06/11/12.  When attempting to re-order through [facility], pt has outstanding authorization #..  Called [employer] rep and they stated there was not a shipment sent out 05/14/12 and authorization # expires today 06/12/12.  Notified husband and requested he call [facility] to clarify regarding Pomalyst script and asked to return call to this RN.  Pt husband verbalized understanding of instructions.

## 2012-06-13 ENCOUNTER — Other Ambulatory Visit: Payer: Self-pay | Admitting: *Deleted

## 2012-06-13 ENCOUNTER — Telehealth: Payer: Self-pay | Admitting: *Deleted

## 2012-06-13 DIAGNOSIS — C9001 Multiple myeloma in remission: Secondary | ICD-10-CM

## 2012-06-13 MED ORDER — POMALIDOMIDE 4 MG PO CAPS: 4.0000 mg | ORAL_CAPSULE | Freq: Every day | ORAL | Status: AC

## 2012-06-13 NOTE — Telephone Encounter (Signed)
Clarified with [facility] risk management that pt did receive 05/14/12 Pomalyst prescription. Took last dose today. They cleared the outstanding authorization so next cycle can be filled.

## 2012-06-14 ENCOUNTER — Other Ambulatory Visit: Payer: Self-pay | Admitting: *Deleted

## 2012-06-14 ENCOUNTER — Telehealth: Payer: Self-pay | Admitting: *Deleted

## 2012-06-14 ENCOUNTER — Encounter: Payer: Self-pay | Admitting: *Deleted

## 2012-06-14 ENCOUNTER — Other Ambulatory Visit (HOSPITAL_BASED_OUTPATIENT_CLINIC_OR_DEPARTMENT_OTHER): Payer: Medicare Other | Admitting: Lab

## 2012-06-14 DIAGNOSIS — C9001 Multiple myeloma in remission: Secondary | ICD-10-CM

## 2012-06-14 LAB — CBC WITH DIFFERENTIAL/PLATELET
Basophils Absolute: 0 10*3/uL (ref 0.0–0.1)
EOS%: 11.3 % — ABNORMAL HIGH (ref 0.0–7.0)
HCT: 27.1 % — ABNORMAL LOW (ref 34.8–46.6)
HGB: 9.3 g/dL — ABNORMAL LOW (ref 11.6–15.9)
LYMPH%: 58.2 % — ABNORMAL HIGH (ref 14.0–49.7)
MCH: 33.3 pg (ref 25.1–34.0)
MCV: 97.8 fL (ref 79.5–101.0)
MONO%: 11 % (ref 0.0–14.0)
NEUT%: 17 % — ABNORMAL LOW (ref 38.4–76.8)
Platelets: 25 10*3/uL — ABNORMAL LOW (ref 145–400)
lymph#: 1.1 10*3/uL (ref 0.9–3.3)

## 2012-06-14 LAB — BASIC METABOLIC PANEL (CC13)
BUN: 21 mg/dL (ref 7.0–26.0)
CO2: 25 mEq/L (ref 22–29)
Chloride: 103 mEq/L (ref 98–107)
Creatinine: 1.2 mg/dL — ABNORMAL HIGH (ref 0.6–1.1)

## 2012-06-14 NOTE — Telephone Encounter (Signed)
Received call from [facility] [phone] reference [phone] SD) requesting Pomalsyt refill.  Also received call from Mr. Lawniczak stating [facility] called and they do not have refill request.  Mr. Klar also wanted to clarify Decadron dose.  Spoke with representative from [facility] and pt has authorization # [id number] for Pomalsyt refill and this RN re-faxed request to [facility]. Per Dr. Truett Perna, pt to take Decadron 40mg  weekly.  Pt husband verbalized understanding and expressed appreciation for call.

## 2012-06-18 LAB — PROTEIN ELECTROPHORESIS, SERUM
Albumin ELP: 53.2 % — ABNORMAL LOW (ref 55.8–66.1)
Beta 2: 2.5 % — ABNORMAL LOW (ref 3.2–6.5)
Beta Globulin: 4.1 % — ABNORMAL LOW (ref 4.7–7.2)

## 2012-06-19 ENCOUNTER — Other Ambulatory Visit: Payer: Self-pay | Admitting: Oncology

## 2012-06-20 ENCOUNTER — Other Ambulatory Visit: Payer: Self-pay | Admitting: Nurse Practitioner

## 2012-06-20 ENCOUNTER — Telehealth: Payer: Self-pay | Admitting: Oncology

## 2012-06-20 ENCOUNTER — Other Ambulatory Visit (HOSPITAL_BASED_OUTPATIENT_CLINIC_OR_DEPARTMENT_OTHER): Payer: Medicare Other | Admitting: Lab

## 2012-06-20 ENCOUNTER — Ambulatory Visit (HOSPITAL_BASED_OUTPATIENT_CLINIC_OR_DEPARTMENT_OTHER): Payer: Medicare Other

## 2012-06-20 ENCOUNTER — Ambulatory Visit (HOSPITAL_BASED_OUTPATIENT_CLINIC_OR_DEPARTMENT_OTHER): Payer: Medicare Other | Admitting: Nurse Practitioner

## 2012-06-20 VITALS — BP 105/57 | HR 98 | Temp 96.8°F | Resp 20 | Ht 64.0 in | Wt 154.2 lb

## 2012-06-20 DIAGNOSIS — C9000 Multiple myeloma not having achieved remission: Secondary | ICD-10-CM

## 2012-06-20 DIAGNOSIS — B029 Zoster without complications: Secondary | ICD-10-CM

## 2012-06-20 DIAGNOSIS — Z23 Encounter for immunization: Secondary | ICD-10-CM

## 2012-06-20 DIAGNOSIS — D702 Other drug-induced agranulocytosis: Secondary | ICD-10-CM

## 2012-06-20 DIAGNOSIS — C9002 Multiple myeloma in relapse: Secondary | ICD-10-CM

## 2012-06-20 DIAGNOSIS — C9001 Multiple myeloma in remission: Secondary | ICD-10-CM

## 2012-06-20 DIAGNOSIS — D649 Anemia, unspecified: Secondary | ICD-10-CM

## 2012-06-20 LAB — CBC WITH DIFFERENTIAL/PLATELET
BASO%: 0.9 % (ref 0.0–2.0)
EOS%: 1.9 % (ref 0.0–7.0)
LYMPH%: 57.5 % — ABNORMAL HIGH (ref 14.0–49.7)
MCHC: 33.7 g/dL (ref 31.5–36.0)
MCV: 99.4 fL (ref 79.5–101.0)
MONO%: 13.7 % (ref 0.0–14.0)
Platelets: 37 10*3/uL — ABNORMAL LOW (ref 145–400)
RBC: 2.55 10*6/uL — ABNORMAL LOW (ref 3.70–5.45)
WBC: 4.7 10*3/uL (ref 3.9–10.3)

## 2012-06-20 MED ORDER — SODIUM CHLORIDE 0.9 % IJ SOLN
10.0000 mL | INTRAMUSCULAR | Status: DC | PRN
  Administered 2012-06-20: 10 mL
  Filled 2012-06-20: qty 10

## 2012-06-20 MED ORDER — INFLUENZA VIRUS VACC SPLIT PF IM SUSP
0.5000 mL | Freq: Once | INTRAMUSCULAR | Status: AC
  Administered 2012-06-20: 0.5 mL via INTRAMUSCULAR
  Filled 2012-06-20: qty 0.5

## 2012-06-20 MED ORDER — SODIUM CHLORIDE 0.9 % IV SOLN
Freq: Once | INTRAVENOUS | Status: AC
  Administered 2012-06-20: 10:00:00 via INTRAVENOUS

## 2012-06-20 MED ORDER — HEPARIN SOD (PORK) LOCK FLUSH 100 UNIT/ML IV SOLN
500.0000 [IU] | Freq: Once | INTRAVENOUS | Status: AC | PRN
  Administered 2012-06-20: 500 [IU]
  Filled 2012-06-20: qty 5

## 2012-06-20 MED ORDER — ZOLEDRONIC ACID 4 MG/5ML IV CONC
4.0000 mg | Freq: Once | INTRAVENOUS | Status: AC
  Administered 2012-06-20: 4 mg via INTRAVENOUS
  Filled 2012-06-20: qty 5

## 2012-06-20 NOTE — Patient Instructions (Addendum)
Zoledronic Acid injection  What is this medicine? ZOLEDRONIC ACID (ZOE le dron ik AS id) lowers the amount of calcium loss from bone. It is used to treat too much calcium in your blood from cancer. It is also used to prevent complications of cancer that has spread to the bone. This medicine may be used for other purposes; ask your health care provider or pharmacist if you have questions. What should I tell my health care provider before I take this medicine? They need to know if you have any of these conditions: -aspirin-sensitive asthma -dental disease -kidney disease -an unusual or allergic reaction to zoledronic acid, other medicines, foods, dyes, or preservatives -pregnant or trying to get pregnant -breast-feeding How should I use this medicine? This medicine is for infusion into a vein. It is given by a health care professional in a hospital or clinic setting. Talk to your pediatrician regarding the use of this medicine in children. Special care may be needed. Overdosage: If you think you have taken too much of this medicine contact a poison control center or emergency room at once. NOTE: This medicine is only for you. Do not share this medicine with others. What if I miss a dose? It is important not to miss your dose. Call your doctor or health care professional if you are unable to keep an appointment. What may interact with this medicine? -certain antibiotics given by injection -NSAIDs, medicines for pain and inflammation, like ibuprofen or naproxen -some diuretics like bumetanide, furosemide -teriparatide -thalidomide This list may not describe all possible interactions. Give your health care provider a list of all the medicines, herbs, non-prescription drugs, or dietary supplements you use. Also tell them if you smoke, drink alcohol, or use illegal drugs. Some items may interact with your medicine. What should I watch for while using this medicine? Visit your doctor or health  care professional for regular checkups. It may be some time before you see the benefit from this medicine. Do not stop taking your medicine unless your doctor tells you to. Your doctor may order blood tests or other tests to see how you are doing. Women should inform their doctor if they wish to become pregnant or think they might be pregnant. There is a potential for serious side effects to an unborn child. Talk to your health care professional or pharmacist for more information. You should make sure that you get enough calcium and vitamin D while you are taking this medicine. Discuss the foods you eat and the vitamins you take with your health care professional. Some people who take this medicine have severe bone, joint, and/or muscle pain. This medicine may also increase your risk for a broken thigh bone. Tell your doctor right away if you have pain in your upper leg or groin. Tell your doctor if you have any pain that does not go away or that gets worse. What side effects may I notice from receiving this medicine? Side effects that you should report to your doctor or health care professional as soon as possible: -allergic reactions like skin rash, itching or hives, swelling of the face, lips, or tongue -anxiety, confusion, or depression -breathing problems -changes in vision -feeling faint or lightheaded, falls -jaw burning, cramping, pain -muscle cramps, stiffness, or weakness -trouble passing urine or change in the amount of urine Side effects that usually do not require medical attention (report to your doctor or health care professional if they continue or are bothersome): -bone, joint, or muscle pain -fever -  hair loss -irritation at site where injected -loss of appetite -nausea, vomiting -stomach upset -tired This list may not describe all possible side effects. Call your doctor for medical advice about side effects. You may report side effects to FDA at 1-800-FDA-1088. Where should  I keep my medicine? This drug is given in a hospital or clinic and will not be stored at home. NOTE: This sheet is a summary. It may not cover all possible information. If you have questions about this medicine, talk to your doctor, pharmacist, or health care provider.  2012, [employer]. (03/24/2011 9:06:58 AM) 

## 2012-06-20 NOTE — Progress Notes (Signed)
OFFICE PROGRESS NOTE  Interval history:  Karen Barron returns as scheduled. She completed cycle 4 Pomalidomide beginning 05/24/2012. She has mild intermittent nausea. No diarrhea. No mouth sores. Stable dyspnea on exertion. Stable chronic abdominal pain. She had no further fevers following the temperature of 101.9 on 06/03/2012.   Objective: Blood pressure 105/57, pulse 98, temperature 96.8 F (36 C), temperature source Oral, resp. rate 20, height 5\' 4"  (1.626 m), weight 154 lb 3.2 oz (69.945 kg).  Oropharynx is without thrush or ulceration. Lungs are clear. Regular cardiac rhythm. Port-A-Cath site is without erythema. Abdomen is soft. No organomegaly. Trace pretibial/lower leg edema bilaterally.  Lab Results: Lab Results  Component Value Date   WBC 4.7 06/20/2012   HGB 8.5* 06/20/2012   HCT 25.4* 06/20/2012   MCV 99.4 06/20/2012   PLT 37* 06/20/2012    Chemistry:    Chemistry      Component Value Date/Time   NA 141 06/14/2012 0854   NA 141 05/17/2012 0939   NA 136 01/22/2012 1118   K 3.9 06/14/2012 0854   K 3.8 05/17/2012 0939   K 3.5 01/22/2012 1118   CL 103 06/14/2012 0854   CL 104 05/17/2012 0939   CL 98 01/22/2012 1118   CO2 25 06/14/2012 0854   CO2 25 05/17/2012 0939   CO2 28 01/22/2012 1118   BUN 21.0 06/14/2012 0854   BUN 16 05/17/2012 0939   BUN 15 01/22/2012 1118   CREATININE 1.2* 06/14/2012 0854   CREATININE 1.00 05/17/2012 0939   CREATININE 1.1 01/22/2012 1118   CREATININE 1.08 10/18/2006 1023      Component Value Date/Time   CALCIUM 9.1 06/14/2012 0854   CALCIUM 8.8 05/17/2012 0939   CALCIUM 8.3 01/22/2012 1118   ALKPHOS 40 05/17/2012 0939   AST 16 05/17/2012 0939   ALT 33 05/17/2012 0939   BILITOT 1.2 05/17/2012 0939       Studies/Results: Dg Chest 2 View  06/03/2012  *RADIOLOGY REPORT*  Clinical Data: Shortness of breath.  Fever.  Multiple myeloma.  CHEST - 2 VIEW  Comparison: 03/09/2012.  Findings: Normal sized heart.  Small amount of linear density in both lungs.  Right jugular port catheter  tip in the region of the junction of the superior vena cava and right atrium.  Left humeral head prosthesis.  Thoracic and thoracolumbar spine vertebroplasty material.  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  IMPRESSION: Small amount of bilateral linear atelectasis or scarring. Otherwise, unremarkable examination.   Original Report Authenticated By: Darrol Angel, M.D.     Medications: I have reviewed the patient's current medications.  Assessment/Plan:  1. Multiple myeloma treated with subcutaneous Velcade and Cytoxan/Decadron on a weekly schedule beginning in May 2011. She completed 1 year of treatment on 01/24/2011. The serum M-spike and IgM level were increased beginning in May 2012. She began treatment with carfilzomib on 05/25/2011. She completed 2 cycles. The serum M-spike and IgM level were higher on 07/12/2011. She began cycle 1 melphalan/prednisone 07/25/2011. The IgM level was improved on 08/21/2011. She began cycle 2 melphalan/prednisone on 08/29/2011. The melphalan dose was reduced to 8 mg daily for 4 days beginning with cycle 2. The melphalan was dose reduced to 6 mg daily for 4 days beginning with the third cycle of melphalan/prednisone on 09/27/2011. She completed a fourth cycle of melphalan/prednisone beginning on 10/25/2011. The IgM level was stable on 11/15/2011. She completed cycle 6 melphalan/prednisone beginning 12/29/2011. The IgM level was increased on 01/22/2012. She began Pomalidomide on  02/26/2012 with weekly dexamethasone. She began cycle 3 on 04/26/2012. The serum M spike and IgM level were slightly lower on 03/18/2012. The IgM level was improved on 04/12/2012, stable 05/17/2012. She completed cycle 4 Pomalidomide beginning 05/24/2012. The IgM level was stable and the serum M spike was improved on 06/14/2012. 2. Hospitalization with shortness of breath/acute diastolic heart failure 02/26/2012 through 03/11/2012. 3. Hospitalization with dyspnea/hypoxia secondary to  volume overload in the setting of diastolic heart failure on 05/26/2011. 4. Hospitalization 06/02/2011 with dyspnea. She was found to have marked anemia with a hemoglobin of 6.7. She received a red cell transfusion 09/27/2011. 5. History of congestive heart failure status post hospital admission 05/26/2011. 6. Thrombocytopenia secondary to multiple myeloma and pomalidomide. Stable. 7. T7 compression fracture status post kyphoplasty 11/23/2007 by Dr. Noel Gerold. 8. Peripheral neuropathy status post evaluation by Dr. Anne Hahn. The neuropathy is most likely related to Velcade therapy. 9. Herpes zoster at the left abdomen and chest wall status post Valtrex therapy. 10. Postherpetic neuralgia. 11. History of neutropenia secondary to Velcade and Cytoxan. There is persistent neutropenia. 12. Pain and swelling of the proximal interphalangeal joint of the right 3rd finger when she was here on 02/21/2011, improved. She was treated with a Medrol Dosepak. 13. Cough, wheezing, dyspnea, and pleuritic right-sided chest pain and here on 01/22/2012. Chest CT showed right upper lobe pneumonia. She completed a 10 day course of Avelox on 01/31/2012. 14. Persistent severe anemia. She last transfused on 06/04/2012. 15. Left wrist pain/tenderness, erythema and edema-02/02/2012,? Gout versus a joint infection. Improved after Keflex and a Medrol Dosepak. 16. Fever/chills 06/03/2012 with no localizing source for infection. Urinalysis was unremarkable. A blood culture was negative. Chest x-ray was negative. She completed a course of Levaquin. She had no further fever.  Disposition-Karen Barron's clinical status appears stable. She has completed 4 cycles of Pomalidomide. The recent IgM level was stable and the serum M spike was improved. She will begin cycle 5 of Pomalidomide as scheduled on 05/21/2012. We will continue to check labs on a weekly basis. She will return for a followup visit in one month. She will contact the office in the  interim with any problems.  Plan reviewed with Dr. Truett Perna.  Lonna Cobb ANP/GNP-BC

## 2012-06-20 NOTE — Telephone Encounter (Signed)
Gave pt appt for September 2013 labs and October 2013 lab and MD

## 2012-06-24 ENCOUNTER — Ambulatory Visit (INDEPENDENT_AMBULATORY_CARE_PROVIDER_SITE_OTHER): Payer: Medicare Other | Admitting: Cardiology

## 2012-06-24 ENCOUNTER — Encounter: Payer: Self-pay | Admitting: Cardiology

## 2012-06-24 VITALS — BP 120/53 | HR 94 | Ht 64.0 in | Wt 158.0 lb

## 2012-06-24 DIAGNOSIS — I5032 Chronic diastolic (congestive) heart failure: Secondary | ICD-10-CM

## 2012-06-24 NOTE — Patient Instructions (Addendum)
Your physician recommends that you schedule a follow-up appointment in: 6 weeks  Your physician has recommended you make the following change in your medication: Take 40mg  twice more today, then take 40mg  two tabs in the am and 1 tab in the pm. until your weight is 154#.  Continue all other medications as directed.

## 2012-06-24 NOTE — Progress Notes (Signed)
HPI [name] comes in today for her very delicate chronic diastolic heart failure. She was recently hospitalized for this.  Last night she has orthopnea and chest tightness. Her weight is up 4 pounds according to her husband. He gave her 40 mg of Lasix this morning and per our protocol plans to give her another 40 mg when he gets home. She takes 40 mg twice a day.  She has multiple myeloma and her last hemoglobin was 8.5. Her O2 sats run around 98% according to her husband.    Past Medical History  Diagnosis Date  . Personal history of colonic polyps   . Osteoporosis   . Waldenstrom's macroglobulinemia   . Diverticular disease   . Thyroid disease   . Fatty liver   . Hypertension   . Diabetes in pregnancy   . Hyperlipidemia   . Spinal stenosis   . Asthma   . Anemia   . DJD (degenerative joint disease)   . Esophageal reflux   . Hiatal hernia   . Chronic diastolic CHF (congestive heart failure)     Echo on 05/30/11 revealed nl LV systolic function, EF 65-70%, no WMAs, grade 2 diastolic dysfunction, mildly dilated RA/RV, peak PA pressure  . Multiple myeloma in remission   . CHF (congestive heart failure) 5/20-03/11/12    Current Outpatient Prescriptions  Medication Sig Dispense Refill  . albuterol (PROVENTIL HFA;VENTOLIN HFA) 108 (90 BASE) MCG/ACT inhaler Inhale 2 puffs into the lungs every 6 (six) hours as needed. For shortness of breath  1 Inhaler  11  . AMBULATORY NON FORMULARY MEDICATION Lab Order (FASTING): Lipid/Hep/TSH  DX: 272.4/995.20/244.9/244.9  1 each  0  . amitriptyline (ELAVIL) 10 MG tablet Take 2 tablets (20 mg total) by mouth at bedtime.  180 tablet  0  . dexamethasone (DECADRON) 4 MG tablet Take 40 mg by mouth once a week. On Saturday.      . diphenoxylate-atropine (LOMOTIL) 2.5-0.025 MG per tablet Take 1 tablet by mouth 4 (four) times daily as needed.      . docusate sodium (COLACE) 100 MG capsule Take 100 mg by mouth as needed.      Marland Kitchen esomeprazole (NEXIUM)  20 MG capsule Take 1 capsule (20 mg total) by mouth daily before breakfast.  90 capsule  0  . furosemide (LASIX) 40 MG tablet Take 1 tablet (40 mg total) by mouth 2 (two) times daily.  30 tablet  0  . HYDROmorphone (DILAUDID) 4 MG tablet Take 4 mg by mouth every 6 (six) hours as needed.      Marland Kitchen levothyroxine (SYNTHROID, LEVOTHROID) 75 MCG tablet Take 75 mcg by mouth daily.      Marland Kitchen lidocaine-prilocaine (EMLA) cream 1 application as needed.      . metoprolol succinate (TOPROL-XL) 50 MG 24 hr tablet Take 1 tablet (50 mg total) by mouth daily. Take with or immediately following a meal.  90 tablet  0  . polyethylene glycol (MIRALAX / GLYCOLAX) packet Take 17 g by mouth daily.      . pomalidomide (POMALYST) 4 MG capsule Take 1 capsule (4 mg total) by mouth daily. for 21 days; then 7 days off  21 capsule  0  . potassium chloride SA (K-DUR,KLOR-CON) 20 MEQ tablet Take 1 tablet (20 mEq total) by mouth 2 (two) times daily.  90 tablet  0  . prochlorperazine (COMPAZINE) 10 MG tablet Take 10 mg by mouth every 6 (six) hours as needed. For nausea      .  ranitidine (ZANTAC) 150 MG tablet Take 150 mg by mouth as needed.      . simvastatin (ZOCOR) 20 MG tablet Take 10 mg by mouth at bedtime.        Allergies  Allergen Reactions  . Cladribine Other (See Comments)  . Codeine Other (See Comments)    don't remember   . Guaifenesin & Derivatives Other (See Comments)    Elevates BP  . Indomethacin Other (See Comments)    Does not remember reaction   . Naproxen Sodium Other (See Comments)    Does not remember reaction   . Risedronate Sodium     Family History  Problem Relation Age of Onset  . Leukemia Father   . Osteoporosis Mother   . Cancer Sister     CNS  . Melanoma Sister     History   Social History  . Marital Status: Married    Spouse Name: N/A    Number of Children: 2  . Years of Education: N/A   Occupational History  . Retired     Social History Main Topics  . Smoking status: Never  Smoker   . Smokeless tobacco: Never Used  . Alcohol Use: No  . Drug Use: No  . Sexually Active: Not Currently   Other Topics Concern  . Not on file   Social History Narrative   No caffeine drinks     ROS ALL NEGATIVE EXCEPT THOSE NOTED IN HPI  PE  General Appearance: well developed, well nourished in no acute distress, pale HEENT: symmetrical face, PERRLA, good dentition  Neck: no JVD, thyromegaly, or adenopathy, trachea midline Chest: symmetric without deformity Cardiac: PMI non-displaced, RRR, normal S1, S2, no gallop or murmur Lung: clear to ausculation and percussion, a few crackles in both bases Vascular: all pulses full without bruits  Abdominal: nondistended, nontender, good bowel sounds, no HSM, no bruits Extremities: no cyanosis, clubbing , 1-2+ pitting edema pretibially, no sign of DVT, no varicosities  Skin: normal color, no rashes Neuro: alert and oriented x 3, non-focal Pysch: normal affect  EKG  BMET    Component Value Date/Time   NA 141 06/14/2012 0854   NA 141 05/17/2012 0939   NA 136 01/22/2012 1118   K 3.9 06/14/2012 0854   K 3.8 05/17/2012 0939   K 3.5 01/22/2012 1118   CL 103 06/14/2012 0854   CL 104 05/17/2012 0939   CL 98 01/22/2012 1118   CO2 25 06/14/2012 0854   CO2 25 05/17/2012 0939   CO2 28 01/22/2012 1118   GLUCOSE 202* 06/14/2012 0854   GLUCOSE 173* 05/17/2012 0939   GLUCOSE 96 01/22/2012 1118   BUN 21.0 06/14/2012 0854   BUN 16 05/17/2012 0939   BUN 15 01/22/2012 1118   CREATININE 1.2* 06/14/2012 0854   CREATININE 1.00 05/17/2012 0939   CREATININE 1.1 01/22/2012 1118   CREATININE 1.08 10/18/2006 1023   CALCIUM 9.1 06/14/2012 0854   CALCIUM 8.8 05/17/2012 0939   CALCIUM 8.3 01/22/2012 1118   GFRNONAA 43* 03/11/2012 0443   GFRAA 50* 03/11/2012 0443    Lipid Panel     Component Value Date/Time   CHOL 66 03/22/2012 1000   TRIG 56 03/22/2012 1000   HDL 35* 03/22/2012 1000   CHOLHDL 1.9 03/22/2012 1000   VLDL 11 03/22/2012 1000   LDLCALC 20 03/22/2012 1000    CBC      Component Value Date/Time   WBC 4.7 06/20/2012 0842   WBC 2.6* 03/11/2012 6295  RBC 2.55* 06/20/2012 0842   RBC 2.95* 03/11/2012 0443   HGB 8.5* 06/20/2012 0842   HGB 8.8* 03/11/2012 0443   HCT 25.4* 06/20/2012 0842   HCT 27.7* 03/11/2012 0443   PLT 37* 06/20/2012 0842   PLT 15* 03/11/2012 0443   MCV 99.4 06/20/2012 0842   MCV 93.9 03/11/2012 0443   MCH 33.5 06/20/2012 0842   MCH 29.8 03/11/2012 0443   MCHC 33.7 06/20/2012 0842   MCHC 31.8 03/11/2012 0443   RDW 20.6* 06/20/2012 0842   RDW 17.3* 03/11/2012 0443   LYMPHSABS 2.7 06/20/2012 0842   LYMPHSABS 1.1 03/11/2012 0443   MONOABS 0.6 06/20/2012 0842   MONOABS 0.8 03/11/2012 0443   EOSABS 0.1 06/20/2012 0842   EOSABS 0.1 03/11/2012 0443   BASOSABS 0.0 06/20/2012 0842   BASOSABS 0.0 03/11/2012 0443

## 2012-06-24 NOTE — Assessment & Plan Note (Signed)
Take extra 40 mg of Lasix when she gets home as well as tonight. We'll take 80 mg each morning and 40 the evening until weight stabilizes at 154. 3 pound protocol reviewed and reinforced. Mr. Curling is very well versed in this and does a great job taking care of her.

## 2012-06-28 ENCOUNTER — Ambulatory Visit: Payer: Medicare Other | Admitting: Lab

## 2012-06-28 ENCOUNTER — Other Ambulatory Visit: Payer: Self-pay | Admitting: Medical Oncology

## 2012-06-28 ENCOUNTER — Encounter (HOSPITAL_COMMUNITY)
Admission: RE | Admit: 2012-06-28 | Discharge: 2012-06-28 | Disposition: A | Discharge status: Home / Self Care | Payer: Medicare Other | Source: Ambulatory Visit | Attending: Oncology | Admitting: Oncology

## 2012-06-28 ENCOUNTER — Other Ambulatory Visit: Payer: Self-pay | Admitting: *Deleted

## 2012-06-28 ENCOUNTER — Other Ambulatory Visit (HOSPITAL_BASED_OUTPATIENT_CLINIC_OR_DEPARTMENT_OTHER): Payer: Medicare Other | Admitting: Lab

## 2012-06-28 ENCOUNTER — Ambulatory Visit (HOSPITAL_BASED_OUTPATIENT_CLINIC_OR_DEPARTMENT_OTHER): Payer: Medicare Other

## 2012-06-28 VITALS — BP 104/40 | HR 82 | Temp 98.0°F | Resp 20

## 2012-06-28 DIAGNOSIS — C9001 Multiple myeloma in remission: Secondary | ICD-10-CM

## 2012-06-28 DIAGNOSIS — D649 Anemia, unspecified: Secondary | ICD-10-CM

## 2012-06-28 DIAGNOSIS — C9000 Multiple myeloma not having achieved remission: Secondary | ICD-10-CM

## 2012-06-28 DIAGNOSIS — D702 Other drug-induced agranulocytosis: Secondary | ICD-10-CM

## 2012-06-28 LAB — CBC WITH DIFFERENTIAL/PLATELET
BASO%: 1 % (ref 0.0–2.0)
MCHC: 31.9 g/dL (ref 31.5–36.0)
MONO#: 0.7 10*3/uL (ref 0.1–0.9)
RBC: 2 10*6/uL — ABNORMAL LOW (ref 3.70–5.45)
WBC: 6 10*3/uL (ref 3.9–10.3)
lymph#: 2.1 10*3/uL (ref 0.9–3.3)

## 2012-06-28 LAB — URINALYSIS, MICROSCOPIC - CHCC
Ketones: NEGATIVE mg/dL
Protein: 30 mg/dL
Specific Gravity, Urine: 1.015 (ref 1.003–1.035)
pH: 6 (ref 4.6–8.0)

## 2012-06-28 LAB — TECHNOLOGIST REVIEW

## 2012-06-28 MED ORDER — SODIUM CHLORIDE 0.9 % IJ SOLN
10.0000 mL | INTRAMUSCULAR | Status: DC | PRN
  Filled 2012-06-28: qty 10

## 2012-06-28 MED ORDER — SODIUM CHLORIDE 0.9 % IV SOLN: 250.0000 mL | Freq: Once | INTRAVENOUS | Status: DC

## 2012-06-28 MED ORDER — TRAMADOL HCL 50 MG PO TABS: 50.0000 mg | ORAL_TABLET | Freq: Four times a day (QID) | ORAL | Status: AC | PRN

## 2012-06-28 MED ORDER — FUROSEMIDE 10 MG/ML IJ SOLN: 20.0000 mg | Freq: Once | INTRAMUSCULAR | Status: DC

## 2012-06-28 MED ORDER — HEPARIN SOD (PORK) LOCK FLUSH 100 UNIT/ML IV SOLN
500.0000 [IU] | Freq: Once | INTRAVENOUS | Status: AC
  Administered 2012-06-28: 500 [IU] via INTRAVENOUS
  Filled 2012-06-28: qty 5

## 2012-06-28 MED ORDER — SODIUM CHLORIDE 0.9 % IJ SOLN
10.0000 mL | INTRAMUSCULAR | Status: DC | PRN
  Administered 2012-06-28: 10 mL via INTRAVENOUS
  Filled 2012-06-28: qty 10

## 2012-06-28 MED ORDER — HEPARIN SOD (PORK) LOCK FLUSH 100 UNIT/ML IV SOLN
500.0000 [IU] | Freq: Every day | INTRAVENOUS | Status: DC | PRN
  Filled 2012-06-28: qty 5

## 2012-06-28 MED ORDER — FUROSEMIDE 10 MG/ML IJ SOLN
20.0000 mg | Freq: Once | INTRAMUSCULAR | Status: AC
  Administered 2012-06-28: 20 mg via INTRAVENOUS

## 2012-06-28 NOTE — Patient Instructions (Addendum)
Blood Transfusion Information  WHAT IS A BLOOD TRANSFUSION?  A transfusion is the replacement of blood or some of its parts. Blood is made up of multiple cells which provide different functions.   Red blood cells carry oxygen and are used for blood loss replacement.   White blood cells fight against infection.   Platelets control bleeding.   Plasma helps clot blood.   Other blood products are available for specialized needs, such as hemophilia or other clotting disorders.  BEFORE THE TRANSFUSION   Who gives blood for transfusions?    You may be able to donate blood to be used at a later date on yourself (autologous donation).   Relatives can be asked to donate blood. This is generally not any safer than if you have received blood from a stranger. The same precautions are taken to ensure safety when a relative's blood is donated.   Healthy volunteers who are fully evaluated to make sure their blood is safe. This is blood bank blood.  Transfusion therapy is the safest it has ever been in the practice of medicine. Before blood is taken from a donor, a complete history is taken to make sure that person has no history of diseases nor engages in risky social behavior (examples are intravenous drug use or sexual activity with multiple partners). The donor's travel history is screened to minimize risk of transmitting infections, such as malaria. The donated blood is tested for signs of infectious diseases, such as HIV and hepatitis. The blood is then tested to be sure it is compatible with you in order to minimize the chance of a transfusion reaction. If you or a relative donates blood, this is often done in anticipation of surgery and is not appropriate for emergency situations. It takes many days to process the donated blood.  RISKS AND COMPLICATIONS  Although transfusion therapy is very safe and saves many lives, the main dangers of transfusion include:    Getting an infectious disease.   Developing a  transfusion reaction. This is an allergic reaction to something in the blood you were given. Every precaution is taken to prevent this.  The decision to have a blood transfusion has been considered carefully by your caregiver before blood is given. Blood is not given unless the benefits outweigh the risks.  AFTER THE TRANSFUSION   Right after receiving a blood transfusion, you will usually feel much better and more energetic. This is especially true if your red blood cells have gotten low (anemic). The transfusion raises the level of the red blood cells which carry oxygen, and this usually causes an energy increase.   The nurse administering the transfusion will monitor you carefully for complications.  HOME CARE INSTRUCTIONS   No special instructions are needed after a transfusion. You may find your energy is better. Speak with your caregiver about any limitations on activity for underlying diseases you may have.  SEEK MEDICAL CARE IF:    Your condition is not improving after your transfusion.   You develop redness or irritation at the intravenous (IV) site.  SEEK IMMEDIATE MEDICAL CARE IF:   Any of the following symptoms occur over the next 12 hours:   Shaking chills.   You have a temperature by mouth above 102 F (38.9 C), not controlled by medicine.   Chest, back, or muscle pain.   People around you feel you are not acting correctly or are confused.   Shortness of breath or difficulty breathing.   Dizziness and fainting.     You get a rash or develop hives.   You have a decrease in urine output.   Your urine turns a dark color or changes to pink, red, or brown.  Any of the following symptoms occur over the next 10 days:   You have a temperature by mouth above 102 F (38.9 C), not controlled by medicine.   Shortness of breath.   Weakness after normal activity.   The white part of the eye turns yellow (jaundice).   You have a decrease in the amount of urine or are urinating less often.   Your  urine turns a dark color or changes to pink, red, or brown.  Document Released: 09/22/2000 Document Revised: 09/14/2011 Document Reviewed: 05/11/2008  ExitCare Patient Information 2012 [employer].

## 2012-06-29 LAB — TYPE AND SCREEN
ABO/RH(D): A POS
Unit division: 0

## 2012-07-02 ENCOUNTER — Telehealth (HOSPITAL_COMMUNITY): Payer: Self-pay | Admitting: Cardiology

## 2012-07-02 ENCOUNTER — Other Ambulatory Visit: Payer: Self-pay | Admitting: *Deleted

## 2012-07-02 DIAGNOSIS — C9002 Multiple myeloma in relapse: Secondary | ICD-10-CM

## 2012-07-02 MED ORDER — METOLAZONE 2.5 MG PO TABS: 2.5000 mg | ORAL_TABLET | ORAL | Status: DC

## 2012-07-02 NOTE — Telephone Encounter (Signed)
HUSBAND CALLED WITH CONCERNS ABOUT WEIGHT GAIN 9/12 153 9/13 155 9/14 156 9/15 157 9/16 157 9/17 158 9/18 158 9/19 158 9/20 159  9/21 159 9/22 159 9/23 160  ON 9/18 PT INCREASED LASIX TO 80 MG Q AM AND 40 MG Q PM AS INSTRUCTED HOWEVER THIS HAS NOT HELP WEIGHT GO DOWN AT ALL. DOES NOT HAVE RX FOR METOLAZONE  DENIES SOB, CHEST PAINS, OR DIZZINESS    ([facility])  PLEASE ADVISE

## 2012-07-02 NOTE — Telephone Encounter (Signed)
Spoke w/pt's husband he reports wt continues to increase, lasix was increased but hasn't helped and wt has increased more, per husband pt maybe a little more SOB than usual and more fatigued, discussed w/Dr Bensimhon via phone he would like for pt to take metolazone 2.5 mg for 2 days pt's husband is aware and agreeable, he will also give her an extra potassium with the metolazone, [facility] on [address], he will call back if not getting better

## 2012-07-04 ENCOUNTER — Other Ambulatory Visit (HOSPITAL_BASED_OUTPATIENT_CLINIC_OR_DEPARTMENT_OTHER): Payer: Medicare Other | Admitting: Lab

## 2012-07-04 ENCOUNTER — Telehealth: Payer: Self-pay | Admitting: *Deleted

## 2012-07-04 DIAGNOSIS — C9002 Multiple myeloma in relapse: Secondary | ICD-10-CM

## 2012-07-04 DIAGNOSIS — C9000 Multiple myeloma not having achieved remission: Secondary | ICD-10-CM

## 2012-07-04 LAB — CBC WITH DIFFERENTIAL/PLATELET
BASO%: 1.1 % (ref 0.0–2.0)
Basophils Absolute: 0.1 10*3/uL (ref 0.0–0.1)
HCT: 26 % — ABNORMAL LOW (ref 34.8–46.6)
HGB: 8.3 g/dL — ABNORMAL LOW (ref 11.6–15.9)
MCHC: 31.9 g/dL (ref 31.5–36.0)
MONO#: 0.9 10*3/uL (ref 0.1–0.9)
NEUT#: 2.2 10*3/uL (ref 1.5–6.5)
NEUT%: 41.8 % (ref 38.4–76.8)
WBC: 5.3 10*3/uL (ref 3.9–10.3)
lymph#: 2 10*3/uL (ref 0.9–3.3)

## 2012-07-04 LAB — BASIC METABOLIC PANEL (CC13)
BUN: 33 mg/dL — ABNORMAL HIGH (ref 7.0–26.0)
Chloride: 97 mEq/L — ABNORMAL LOW (ref 98–107)
Potassium: 3.5 mEq/L (ref 3.5–5.1)

## 2012-07-04 NOTE — Telephone Encounter (Signed)
Spoke w/pt's husband, she took 2nd dose of metolazone last night, he will call back tomorrow AM with update

## 2012-07-04 NOTE — Telephone Encounter (Signed)
Husband declines need for transfusion at this time. Confirmed today is day 14 of Pomalidomide 4 mg. Next lab 2012-07-20. Reports she is not eating and is very weak and lethargic, adding "she's almost an invalid now". Reports cardiology has had him giving Lasix bid due to weight gain. Made him aware of Bmet results and these have been forwarded to office. Also wanted Dr. Truett Perna aware that he felt that Amaziah's urine tends to "smell terrible" when she is getting a transfusion and looks darker (denies seeing blood in urine). He does not note this odor at other times she voids.

## 2012-07-04 NOTE — Telephone Encounter (Signed)
After taking two doses of Metalozone, pts weight is now 156 from 160. Denies SOB, or chest pains.  She only c/o increased fatigue and weakness.

## 2012-07-05 ENCOUNTER — Other Ambulatory Visit: Payer: Medicare Other | Admitting: Lab

## 2012-07-05 ENCOUNTER — Telehealth (HOSPITAL_COMMUNITY): Payer: Self-pay | Admitting: *Deleted

## 2012-07-05 NOTE — Telephone Encounter (Signed)
Spoke w/pt's husband today wt is 157, no SOB, no edema, she was able to sleep in the bed last night, advised to back lasix back down to 40 mg bid and will take extra 40 if wt is 158 or greater

## 2012-07-08 ENCOUNTER — Other Ambulatory Visit: Payer: Self-pay | Admitting: *Deleted

## 2012-07-08 DIAGNOSIS — C9001 Multiple myeloma in remission: Secondary | ICD-10-CM

## 2012-07-09 ENCOUNTER — Other Ambulatory Visit: Payer: Self-pay | Admitting: *Deleted

## 2012-07-09 NOTE — Telephone Encounter (Signed)
THIS REFILL REQUEST FOR POMALYST WAS GIVEN TO DR.SHERRILL'S NURSE, SUSAN COWARD,RN. 

## 2012-07-11 ENCOUNTER — Other Ambulatory Visit: Payer: Self-pay | Admitting: Cardiology

## 2012-07-12 ENCOUNTER — Inpatient Hospital Stay (HOSPITAL_COMMUNITY)
Admission: AD | Admit: 2012-07-12 | Discharge: 2012-08-09 | DRG: 840 | Disposition: E | Payer: Medicare Other | Source: Ambulatory Visit | Attending: Internal Medicine | Admitting: Internal Medicine

## 2012-07-12 ENCOUNTER — Encounter (HOSPITAL_COMMUNITY)
Admission: RE | Admit: 2012-07-12 | Discharge: 2012-07-12 | Disposition: A | Discharge status: Home / Self Care | Payer: Medicare Other | Source: Ambulatory Visit | Attending: Oncology | Admitting: Oncology

## 2012-07-12 ENCOUNTER — Ambulatory Visit (HOSPITAL_BASED_OUTPATIENT_CLINIC_OR_DEPARTMENT_OTHER): Payer: Medicare Other

## 2012-07-12 ENCOUNTER — Other Ambulatory Visit (HOSPITAL_BASED_OUTPATIENT_CLINIC_OR_DEPARTMENT_OTHER): Payer: Medicare Other | Admitting: Lab

## 2012-07-12 ENCOUNTER — Telehealth: Payer: Self-pay | Admitting: *Deleted

## 2012-07-12 ENCOUNTER — Encounter (HOSPITAL_COMMUNITY): Payer: Self-pay | Admitting: *Deleted

## 2012-07-12 ENCOUNTER — Other Ambulatory Visit: Payer: Self-pay | Admitting: *Deleted

## 2012-07-12 VITALS — BP 103/39 | HR 79 | Temp 98.2°F | Resp 24

## 2012-07-12 DIAGNOSIS — N179 Acute kidney failure, unspecified: Secondary | ICD-10-CM | POA: Diagnosis present

## 2012-07-12 DIAGNOSIS — D649 Anemia, unspecified: Secondary | ICD-10-CM | POA: Insufficient documentation

## 2012-07-12 DIAGNOSIS — C9001 Multiple myeloma in remission: Secondary | ICD-10-CM

## 2012-07-12 DIAGNOSIS — C9000 Multiple myeloma not having achieved remission: Secondary | ICD-10-CM

## 2012-07-12 DIAGNOSIS — D6959 Other secondary thrombocytopenia: Secondary | ICD-10-CM | POA: Diagnosis present

## 2012-07-12 DIAGNOSIS — R0602 Shortness of breath: Secondary | ICD-10-CM

## 2012-07-12 DIAGNOSIS — D630 Anemia in neoplastic disease: Secondary | ICD-10-CM | POA: Diagnosis present

## 2012-07-12 DIAGNOSIS — T451X5A Adverse effect of antineoplastic and immunosuppressive drugs, initial encounter: Secondary | ICD-10-CM | POA: Diagnosis present

## 2012-07-12 DIAGNOSIS — I5033 Acute on chronic diastolic (congestive) heart failure: Secondary | ICD-10-CM | POA: Diagnosis present

## 2012-07-12 DIAGNOSIS — R5381 Other malaise: Secondary | ICD-10-CM | POA: Diagnosis present

## 2012-07-12 DIAGNOSIS — N184 Chronic kidney disease, stage 4 (severe): Secondary | ICD-10-CM | POA: Diagnosis present

## 2012-07-12 DIAGNOSIS — I5032 Chronic diastolic (congestive) heart failure: Secondary | ICD-10-CM

## 2012-07-12 DIAGNOSIS — I5030 Unspecified diastolic (congestive) heart failure: Secondary | ICD-10-CM

## 2012-07-12 DIAGNOSIS — D696 Thrombocytopenia, unspecified: Secondary | ICD-10-CM | POA: Diagnosis present

## 2012-07-12 DIAGNOSIS — C9002 Multiple myeloma in relapse: Secondary | ICD-10-CM

## 2012-07-12 DIAGNOSIS — C8800 Waldenstrom macroglobulinemia not having achieved remission: Secondary | ICD-10-CM | POA: Diagnosis present

## 2012-07-12 DIAGNOSIS — K219 Gastro-esophageal reflux disease without esophagitis: Secondary | ICD-10-CM | POA: Diagnosis present

## 2012-07-12 DIAGNOSIS — E875 Hyperkalemia: Secondary | ICD-10-CM

## 2012-07-12 DIAGNOSIS — I129 Hypertensive chronic kidney disease with stage 1 through stage 4 chronic kidney disease, or unspecified chronic kidney disease: Secondary | ICD-10-CM | POA: Diagnosis present

## 2012-07-12 DIAGNOSIS — E039 Hypothyroidism, unspecified: Secondary | ICD-10-CM | POA: Diagnosis present

## 2012-07-12 DIAGNOSIS — G609 Hereditary and idiopathic neuropathy, unspecified: Secondary | ICD-10-CM | POA: Diagnosis present

## 2012-07-12 DIAGNOSIS — Z66 Do not resuscitate: Secondary | ICD-10-CM | POA: Diagnosis present

## 2012-07-12 DIAGNOSIS — I509 Heart failure, unspecified: Secondary | ICD-10-CM | POA: Diagnosis present

## 2012-07-12 DIAGNOSIS — R109 Unspecified abdominal pain: Secondary | ICD-10-CM

## 2012-07-12 DIAGNOSIS — Z515 Encounter for palliative care: Secondary | ICD-10-CM

## 2012-07-12 DIAGNOSIS — D72822 Plasmacytosis: Secondary | ICD-10-CM | POA: Diagnosis present

## 2012-07-12 DIAGNOSIS — G629 Polyneuropathy, unspecified: Secondary | ICD-10-CM | POA: Diagnosis present

## 2012-07-12 LAB — CBC WITH DIFFERENTIAL/PLATELET
BASO%: 1 % (ref 0.0–2.0)
Basophils Absolute: 0.1 10*3/uL (ref 0.0–0.1)
Eosinophils Absolute: 0.4 10*3/uL (ref 0.0–0.5)
HCT: 21.7 % — ABNORMAL LOW (ref 34.8–46.6)
LYMPH%: 46.4 % (ref 14.0–49.7)
MCHC: 31.3 g/dL — ABNORMAL LOW (ref 31.5–36.0)
MONO#: 1.5 10*3/uL — ABNORMAL HIGH (ref 0.1–0.9)
NEUT%: 21.8 % — ABNORMAL LOW (ref 38.4–76.8)
Platelets: 39 10*3/uL — ABNORMAL LOW (ref 145–400)
WBC: 6.2 10*3/uL (ref 3.9–10.3)

## 2012-07-12 LAB — BASIC METABOLIC PANEL (CC13)
BUN: 49 mg/dL — ABNORMAL HIGH (ref 7.0–26.0)
Chloride: 102 mEq/L (ref 98–107)
Glucose: 173 mg/dl — ABNORMAL HIGH (ref 70–99)
Potassium: 5 mEq/L (ref 3.5–5.1)

## 2012-07-12 LAB — BASIC METABOLIC PANEL
CO2: 20 mEq/L (ref 19–32)
Calcium: 8.3 mg/dL — ABNORMAL LOW (ref 8.4–10.5)
Creatinine, Ser: 1.64 mg/dL — ABNORMAL HIGH (ref 0.50–1.10)
GFR calc non Af Amer: 28 mL/min — ABNORMAL LOW (ref >90)
Glucose, Bld: 110 mg/dL — ABNORMAL HIGH (ref 70–99)
Sodium: 134 mEq/L — ABNORMAL LOW (ref 135–145)

## 2012-07-12 LAB — PREPARE RBC (CROSSMATCH)

## 2012-07-12 MED ORDER — HEPARIN SOD (PORK) LOCK FLUSH 100 UNIT/ML IV SOLN
250.0000 [IU] | INTRAVENOUS | Status: DC | PRN
  Filled 2012-07-12: qty 3

## 2012-07-12 MED ORDER — SODIUM CHLORIDE 0.9 % IJ SOLN
10.0000 mL | INTRAMUSCULAR | Status: AC | PRN
  Administered 2012-07-14: 10 mL

## 2012-07-12 MED ORDER — HEPARIN SOD (PORK) LOCK FLUSH 100 UNIT/ML IV SOLN: 500.0000 [IU] | Freq: Every day | INTRAVENOUS | Status: DC | PRN

## 2012-07-12 MED ORDER — PANTOPRAZOLE SODIUM 40 MG PO TBEC
40.0000 mg | DELAYED_RELEASE_TABLET | Freq: Every day | ORAL | Status: DC
  Administered 2012-07-13 – 2012-07-22 (×9): 40 mg via ORAL
  Filled 2012-07-12 (×13): qty 1

## 2012-07-12 MED ORDER — SODIUM CHLORIDE 0.9 % IJ SOLN: 10.0000 mL | INTRAMUSCULAR | Status: DC | PRN

## 2012-07-12 MED ORDER — HEPARIN SOD (PORK) LOCK FLUSH 100 UNIT/ML IV SOLN: 250.0000 [IU] | INTRAVENOUS | Status: DC | PRN

## 2012-07-12 MED ORDER — HEPARIN SOD (PORK) LOCK FLUSH 100 UNIT/ML IV SOLN
500.0000 [IU] | Freq: Every day | INTRAVENOUS | Status: AC | PRN
Start: 2012-07-12 — End: 2012-07-12
  Administered 2012-07-12: 500 [IU]
  Filled 2012-07-12: qty 5

## 2012-07-12 MED ORDER — SODIUM CHLORIDE 0.9 % IJ SOLN
3.0000 mL | INTRAMUSCULAR | Status: DC | PRN
  Filled 2012-07-12: qty 10

## 2012-07-12 MED ORDER — SODIUM CHLORIDE 0.9 % IJ SOLN: 3.0000 mL | INTRAMUSCULAR | Status: DC | PRN

## 2012-07-12 MED ORDER — SODIUM CHLORIDE 0.9 % IJ SOLN
3.0000 mL | Freq: Two times a day (BID) | INTRAMUSCULAR | Status: DC
  Administered 2012-07-13: 3 mL via INTRAVENOUS

## 2012-07-12 MED ORDER — FUROSEMIDE 10 MG/ML IJ SOLN
20.0000 mg | Freq: Once | INTRAMUSCULAR | Status: AC
  Administered 2012-07-12: 14:00:00 via INTRAVENOUS

## 2012-07-12 MED ORDER — SODIUM CHLORIDE 0.9 % IJ SOLN
10.0000 mL | INTRAMUSCULAR | Status: AC | PRN
  Administered 2012-07-12: 10 mL
  Filled 2012-07-12: qty 10

## 2012-07-12 MED ORDER — FUROSEMIDE 10 MG/ML IJ SOLN
40.0000 mg | Freq: Two times a day (BID) | INTRAMUSCULAR | Status: DC
  Administered 2012-07-13 – 2012-07-14 (×3): 40 mg via INTRAVENOUS
  Filled 2012-07-12 (×6): qty 4

## 2012-07-12 MED ORDER — DEXAMETHASONE 6 MG PO TABS
40.0000 mg | ORAL_TABLET | ORAL | Status: DC
  Administered 2012-07-13: 40 mg via ORAL
  Filled 2012-07-12: qty 1

## 2012-07-12 MED ORDER — SODIUM CHLORIDE 0.9 % IV SOLN
250.0000 mL | Freq: Once | INTRAVENOUS | Status: DC
Start: 2012-07-12 — End: 2012-07-13

## 2012-07-12 MED ORDER — LEVOTHYROXINE SODIUM 75 MCG PO TABS
75.0000 ug | ORAL_TABLET | Freq: Every day | ORAL | Status: DC
  Administered 2012-07-13 – 2012-07-22 (×9): 75 ug via ORAL
  Filled 2012-07-12 (×15): qty 1

## 2012-07-12 MED ORDER — ALBUTEROL SULFATE HFA 108 (90 BASE) MCG/ACT IN AERS
2.0000 | INHALATION_SPRAY | RESPIRATORY_TRACT | Status: DC | PRN
  Administered 2012-07-19 – 2012-07-22 (×2): 2 via RESPIRATORY_TRACT
  Filled 2012-07-12: qty 6.7

## 2012-07-12 MED ORDER — HEPARIN SOD (PORK) LOCK FLUSH 100 UNIT/ML IV SOLN
250.0000 [IU] | INTRAVENOUS | Status: DC | PRN
Start: 2012-07-12 — End: 2012-07-13

## 2012-07-12 MED ORDER — FAMOTIDINE 10 MG PO TABS
10.0000 mg | ORAL_TABLET | Freq: Every day | ORAL | Status: DC
  Administered 2012-07-12 – 2012-07-22 (×10): 10 mg via ORAL
  Filled 2012-07-12 (×12): qty 1

## 2012-07-12 MED ORDER — FUROSEMIDE 10 MG/ML IJ SOLN
40.0000 mg | Freq: Once | INTRAMUSCULAR | Status: AC
  Administered 2012-07-13: 40 mg via INTRAVENOUS
  Filled 2012-07-12: qty 4

## 2012-07-12 MED ORDER — AMITRIPTYLINE HCL 10 MG PO TABS
20.0000 mg | ORAL_TABLET | Freq: Every day | ORAL | Status: DC
  Administered 2012-07-12 – 2012-07-21 (×9): 20 mg via ORAL
  Filled 2012-07-12 (×12): qty 2

## 2012-07-12 MED ORDER — SODIUM CHLORIDE 0.9 % IV SOLN: 250.0000 mL | Freq: Once | INTRAVENOUS | Status: DC

## 2012-07-12 MED ORDER — FUROSEMIDE 10 MG/ML IJ SOLN
20.0000 mg | Freq: Once | INTRAMUSCULAR | Status: DC
  Filled 2012-07-12: qty 2

## 2012-07-12 MED ORDER — SODIUM CHLORIDE 0.9 % IV SOLN
250.0000 mL | Freq: Once | INTRAVENOUS | Status: AC
  Administered 2012-07-12: 250 mL via INTRAVENOUS

## 2012-07-12 MED ORDER — HYDROMORPHONE HCL 4 MG PO TABS
4.0000 mg | ORAL_TABLET | Freq: Four times a day (QID) | ORAL | Status: DC | PRN
  Administered 2012-07-12 – 2012-07-22 (×5): 4 mg via ORAL
  Filled 2012-07-12 (×3): qty 1
  Filled 2012-07-12: qty 2
  Filled 2012-07-12 (×3): qty 1

## 2012-07-12 MED ORDER — FUROSEMIDE 10 MG/ML IJ SOLN: 20.0000 mg | Freq: Once | INTRAMUSCULAR | Status: DC

## 2012-07-12 NOTE — Patient Instructions (Addendum)
Blood Transfusion Information WHAT IS A BLOOD TRANSFUSION? A transfusion is the replacement of blood or some of its parts. Blood is made up of multiple cells which provide different functions.  Red blood cells carry oxygen and are used for blood loss replacement.  White blood cells fight against infection.  Platelets control bleeding.  Plasma helps clot blood.  Other blood products are available for specialized needs, such as hemophilia or other clotting disorders. BEFORE THE TRANSFUSION  Who gives blood for transfusions?   You may be able to donate blood to be used at a later date on yourself (autologous donation).  Relatives can be asked to donate blood. This is generally not any safer than if you have received blood from a stranger. The same precautions are taken to ensure safety when a relative's blood is donated.  Healthy volunteers who are fully evaluated to make sure their blood is safe. This is blood bank blood. Transfusion therapy is the safest it has ever been in the practice of medicine. Before blood is taken from a donor, a complete history is taken to make sure that person has no history of diseases nor engages in risky social behavior (examples are intravenous drug use or sexual activity with multiple partners). The donor's travel history is screened to minimize risk of transmitting infections, such as malaria. The donated blood is tested for signs of infectious diseases, such as HIV and hepatitis. The blood is then tested to be sure it is compatible with you in order to minimize the chance of a transfusion reaction. If you or a relative donates blood, this is often done in anticipation of surgery and is not appropriate for emergency situations. It takes many days to process the donated blood. RISKS AND COMPLICATIONS Although transfusion therapy is very safe and saves many lives, the main dangers of transfusion include:   Getting an infectious disease.  Developing a  transfusion reaction. This is an allergic reaction to something in the blood you were given. Every precaution is taken to prevent this. The decision to have a blood transfusion has been considered carefully by your caregiver before blood is given. Blood is not given unless the benefits outweigh the risks. AFTER THE TRANSFUSION  Right after receiving a blood transfusion, you will usually feel much better and more energetic. This is especially true if your red blood cells have gotten low (anemic). The transfusion raises the level of the red blood cells which carry oxygen, and this usually causes an energy increase.  The nurse administering the transfusion will monitor you carefully for complications. HOME CARE INSTRUCTIONS  No special instructions are needed after a transfusion. You may find your energy is better. Speak with your caregiver about any limitations on activity for underlying diseases you may have. SEEK MEDICAL CARE IF:   Your condition is not improving after your transfusion.  You develop redness or irritation at the intravenous (IV) site. SEEK IMMEDIATE MEDICAL CARE IF:  Any of the following symptoms occur over the next 12 hours:  Shaking chills.  You have a temperature by mouth above 102 F (38.9 C), not controlled by medicine.  Chest, back, or muscle pain.  People around you feel you are not acting correctly or are confused.  Shortness of breath or difficulty breathing.  Dizziness and fainting.  You get a rash or develop hives.  You have a decrease in urine output.  Your urine turns a dark color or changes to pink, red, or brown. Any of the following   symptoms occur over the next 10 days:  You have a temperature by mouth above 102 F (38.9 C), not controlled by medicine.  Shortness of breath.  Weakness after normal activity.  The white part of the eye turns yellow (jaundice).  You have a decrease in the amount of urine or are urinating less often.  Your  urine turns a dark color or changes to pink, red, or brown. Document Released: 09/22/2000 Document Revised: 12/18/2011 Document Reviewed: 05/11/2008 ExitCare Patient Information 2013 [employer].  

## 2012-07-12 NOTE — Telephone Encounter (Signed)
Hgb 6.8 today. Reports feeling "terrible". Husband reports extreme fatigue and weakness, saying "she can't do anything". Increase in dyspnea. Patient reports she is having increased pain left rib cage over past several days that current pain med dose not seem to resolve, but admits she is not taking it consistently. Will receive transfusion today and MD will be notified of status. Holding off on Pomalidomide refill at MD request till she is evaluated.

## 2012-07-12 NOTE — Addendum Note (Signed)
Addended by: Barbara Cower on: 2012-07-14 05:03 PM   Modules accepted: Orders

## 2012-07-12 NOTE — Progress Notes (Signed)
1015---Patient with multiple issues today, concerning of patient history of CHF with blood transfusion scheduled today. Notified Dr Truett Perna. Continue with transfusion as scheduled, he or Lonna Cobb, NP with come see patient in treatment area. 1315--Dr Jamesetta Geralds to see patient, will contact hospitalist/cardiologist for potential admission. 1400--Admissions to call when bed available. 1515--Have not heard from admissions, this nurse called for status update, patient room 1232-[facility] ready. Patient spouse will check patient in with admissions.

## 2012-07-12 NOTE — Progress Notes (Signed)
OFFICE PROGRESS NOTE  Interval history:  Karen Barron is an 78 year old woman with multiple myeloma. She completed cycle 5 of Pomalidomide beginning 06/21/2012.  She presented to the office today for routine lab work. The hemoglobin returned at 6.8. A red cell transfusion was initiated. Her nurse in the infusion area requested evaluation secondary to weakness and shortness of breath.  [name] and her husband report progressive weakness over the past 10-14 days. She has increased leg swelling. She notes increased shortness of breath when she lays flat. She reports stable dyspnea on exertion. She has been unable to sleep in the bed recently due to pain at the left side. She denies fever. No cough.   Objective: Blood pressure 115/50, pulse 97, temperature 97.3 F (36.3 C), temperature source Oral, resp. rate 22, SpO2 100.00%.  Oropharynx is without thrush. Rales at the left lung base. Increased respiratory rate. Regular cardiac rhythm. Port-A-Cath site is without erythema. Abdomen is soft and nontender. 2+ pitting edema at the lower legs bilaterally. Mild tenderness over the left anterolateral chest wall. No rash.  Lab Results: Lab Results  Component Value Date   WBC 6.2 07/17/2012   HGB 6.8* 07/11/2012   HCT 21.7* 07/14/2012   MCV 99.5 07/27/2012   PLT 39* 08/05/2012    Chemistry:    Chemistry      Component Value Date/Time   NA 133* 07/31/2012 0834   NA 141 05/17/2012 0939   NA 136 01/22/2012 1118   K 5.0 Repeated and Verified 07/11/2012 0834   K 3.8 05/17/2012 0939   K 3.5 01/22/2012 1118   CL 102 07/27/2012 0834   CL 104 05/17/2012 0939   CL 98 01/22/2012 1118   CO2 14* 07/11/2012 0834   CO2 25 05/17/2012 0939   CO2 28 01/22/2012 1118   BUN 49.0* 08/06/2012 0834   BUN 16 05/17/2012 0939   BUN 15 01/22/2012 1118   CREATININE 1.9* 07/22/2012 0834   CREATININE 1.00 05/17/2012 0939   CREATININE 1.1 01/22/2012 1118   CREATININE 1.08 10/18/2006 1023      Component Value Date/Time   CALCIUM 8.3*  07/20/2012 0834   CALCIUM 8.8 05/17/2012 0939   CALCIUM 8.3 01/22/2012 1118   ALKPHOS 40 05/17/2012 0939   AST 16 05/17/2012 0939   ALT 33 05/17/2012 0939   BILITOT 1.2 05/17/2012 0939       Studies/Results: No results found.  Medications: I have reviewed the patient's current medications.  Assessment/Plan:  1. Multiple myeloma treated with subcutaneous Velcade and Cytoxan/Decadron on a weekly schedule beginning in May 2011. She completed 1 year of treatment on 01/24/2011. The serum M-spike and IgM level were increased beginning in May 2012. She began treatment with carfilzomib on 05/25/2011. She completed 2 cycles. The serum M-spike and IgM level were higher on 07/12/2011. She began cycle 1 melphalan/prednisone 07/25/2011. The IgM level was improved on 08/21/2011. She began cycle 2 melphalan/prednisone on 08/29/2011. The melphalan dose was reduced to 8 mg daily for 4 days beginning with cycle 2. The melphalan was dose reduced to 6 mg daily for 4 days beginning with the third cycle of melphalan/prednisone on 09/27/2011. She completed a fourth cycle of melphalan/prednisone beginning on 10/25/2011. The IgM level was stable on 11/15/2011. She completed cycle 6 melphalan/prednisone beginning 12/29/2011. The IgM level was increased on 01/22/2012. She began Pomalidomide on 02/26/2012 with weekly dexamethasone. She began cycle 3 on 04/26/2012. The serum M spike and IgM level were slightly lower on 03/18/2012. The IgM level was  improved on 04/12/2012, stable 05/17/2012. She completed cycle 4 Pomalidomide beginning 05/24/2012. The IgM level was stable and the serum M spike was improved on 06/14/2012. She began cycle 5 Pomalidomide beginning 06/21/2012. 2. Hospitalization with shortness of breath/acute diastolic heart failure 02/26/2012 through 03/11/2012. 3. Hospitalization with dyspnea/hypoxia secondary to volume overload in the setting of diastolic heart failure on 05/26/2011. 4. Hospitalization 06/02/2011 with  dyspnea. She was found to have marked anemia with a hemoglobin of 6.7. She received a red cell transfusion 09/27/2011. 5. History of congestive heart failure status post hospital admission 05/26/2011. 6. Thrombocytopenia secondary to multiple myeloma and pomalidomide. Stable. 7. T7 compression fracture status post kyphoplasty 11/23/2007 by Dr. Noel Gerold. 8. Peripheral neuropathy status post evaluation by Dr. Anne Hahn. The neuropathy is most likely related to Velcade therapy. 9. Herpes zoster at the left abdomen and chest wall status post Valtrex therapy. 10. Postherpetic neuralgia. 11. History of neutropenia secondary to Velcade and Cytoxan. There is persistent neutropenia. 12. Pain and swelling of the proximal interphalangeal joint of the right 3rd finger when she was here on 02/21/2011, improved. She was treated with a Medrol Dosepak. 13. Cough, wheezing, dyspnea, and pleuritic right-sided chest pain and here on 01/22/2012. Chest CT showed right upper lobe pneumonia. She completed a 10 day course of Avelox on 01/31/2012. 14. Persistent severe anemia. She is currently receiving a blood transfusion. 15. Left wrist pain/tenderness, erythema and edema-02/02/2012,? Gout versus a joint infection. Improved after Keflex and a Medrol Dosepak. 16. Fever/chills 06/03/2012 with no localizing source for infection. Urinalysis was unremarkable. A blood culture was negative. Chest x-ray was negative. She completed a course of Levaquin. She had no further fever. 17. Dyspnea/tachypnea, increased leg edema. 18. Pain/tenderness at the left anterolateral chest wall.  Disposition-[name] is currently receiving a red cell transfusion for symptomatic anemia. Per her husband's report her overall condition has been declining for one to 2 weeks. There appears to be some element of fluid overload. Dr. Truett Perna feels that she needs to be hospitalized for diuresis/close monitoring of her fluid status. Dr. Truett Perna has spoken with  Dr. Elisabeth Pigeon. Ms. Clair will be directly admitted to [facility] on the hospitalist service.  Please call oncology over the weekend as needed. Dr. Truett Perna will see Ms. Muldrew in the hospital on 06/17/2012.  Lonna Cobb ANP/GNP-BC

## 2012-07-12 NOTE — H&P (Signed)
[facility] History and Physical  [name] ZOX:[id number] DOB: [birth year] DOA: 07/22/2012  Referring physician: Dr Truett Perna PCP: Marga Melnick, MD  Specialists: Dr. Truett Perna  Chief Complaint: Symptomatic anemia  HPI: Karen Barron is a 78 y.o. female who went to the lab at the cancer Center today for routine bloodwork.  She was told that she anemia, and had a blood transfusion with one unit of packed red blood cells and Lasix after.  She was noted that she has had some increased WOB and-has been having some pain in her Left upper abdomen, and chest and because of the pain has slept in the chair for the past couple of weeks.  SHe has also had some shoulder pain on the L shoulder.  Her appetite has decreased.  everytime she tries to lay down, she has pain, not SOB. She has noted increasing LE swelling over the past 2-3 days.  Her dry weight is usually 155 Today was the last of a 3 week round of chemo-per Daughter, the chemo seems to be working well Is on chronic dilaudid for abd pain-She hadn't had pain meds till a couple of days ago and this was dilated-in between the time that she did not have a lot of she was on when necessary tramadol Daughter states diuretics were adjusted by Cardiologist 9/16-she was increased on her Lasix to 80 mg in the morning and 40 mg in the evening and only over the past 8 was she backed down to her 40 bid Lasix dose.  She was increased on her lasix am 80mg  am for the past 2 days. Her daughter reports that she is normally able to do most activities in ADLs without any disparity but recently over the past 2-3 days can only walk about 12 steps without feeling short of breath  Review of Systems: The patient denies headache, no blurred or double vision, no sore throat, no fevers, , no sputum, , no dysuria, + dark stool-last night this started in the amshe has taken howver V8 juice which daughter has noted diarrhoea with  Past Medical History  Diagnosis Date    . Personal history of colonic polyps   . Osteoporosis   . Waldenstrom's macroglobulinemia   . Diverticular disease   . Thyroid disease   . Fatty liver   . Hypertension   . Diabetes in pregnancy   . Hyperlipidemia   . Spinal stenosis   . Asthma   . Anemia   . DJD (degenerative joint disease)   . Esophageal reflux   . Hiatal hernia   . Chronic diastolic CHF (congestive heart failure)     Echo on 05/30/11 revealed nl LV systolic function, EF 65-70%, no WMAs, grade 2 diastolic dysfunction, mildly dilated RA/RV, peak PA pressure  . Multiple myeloma in remission   . CHF (congestive heart failure) 5/20-03/11/12   Chart review  Admission 02/26/2012 for refractory IgM multiple myeloma he treated with salvage Pomalidomide-developed acute on chronic diastolic dysfunction at that admission  Admission 06/02/2011 with diastolic dysfunction and anemia  Admission 05/26/2011 with dyspnea hypoxia secondary to volume overload  Admission 10/17/2006 for left shoulder adhesions and shoulder pain with arthroplasty  Admission 11/10/2005 with noncardiac chest pain-at this admission nonobstructive CAD was noted by cardiac catheterization  Noted CT with distal transverse colon colitis 05/01/2007-colonoscopy showed diverticulosis  Seen by pulmonary 01/09/2007 and dyspnea on exertion secondary to deconditioning-noted mild obstructive sleep apnea 07/27/2005  Patient had L5/S1 posterior spinal fusion 06/09/2004  Past Surgical  History  Procedure Date  . Total abdominal hysterectomy     due to fibroids  . Appendectomy   . Kyphosis surgery   . Cataract extraction   . Colonoscopy w/ polypectomy   . Total knee arthroplasty     bilateral  . Lumbar fusion   . Portacath placement 04/15/2009   Social History:  reports that she has never smoked. She has never used smokeless tobacco. She reports that she does not drink alcohol or use illicit drugs. Patient lives at home with her daughter and with her  husband who is her healthcare power of attorney Patient usually can do ADLs without any issues  Allergies  Allergen Reactions  . Cladribine Other (See Comments)  . Codeine Other (See Comments)    don't remember   . Guaifenesin & Derivatives Other (See Comments)    Elevates BP  . Indomethacin Other (See Comments)    Does not remember reaction   . Naproxen Sodium Other (See Comments)    Does not remember reaction   . Risedronate Sodium     Family History  Problem Relation Age of Onset  . Leukemia Father   . Osteoporosis Mother   . Cancer Sister     CNS  . Melanoma Sister      Prior to Admission medications   Medication Sig Start Date End Date Taking? Authorizing Provider  albuterol (PROVENTIL HFA;VENTOLIN HFA) 108 (90 BASE) MCG/ACT inhaler Inhale 2 puffs into the lungs every 6 (six) hours as needed. For shortness of breath 03/11/12   Alison Murray, MD  AMBULATORY NON FORMULARY MEDICATION Lab Order (FASTING): Lipid/Hep/TSH  DX: 272.4/995.20/244.9/244.9 05/29/12   Pecola Lawless, MD  amitriptyline (ELAVIL) 10 MG tablet Take 2 tablets (20 mg total) by mouth at bedtime. 06/06/12   Pecola Lawless, MD  dexamethasone (DECADRON) 4 MG tablet Take 40 mg by mouth once a week. On Saturday. 06/11/12   Ladene Artist, MD  diphenoxylate-atropine (LOMOTIL) 2.5-0.025 MG per tablet Take 1 tablet by mouth 4 (four) times daily as needed.    Historical Provider, MD  docusate sodium (COLACE) 100 MG capsule Take 100 mg by mouth as needed.    Historical Provider, MD  esomeprazole (NEXIUM) 20 MG capsule Take 1 capsule (20 mg total) by mouth daily before breakfast. 05/14/12   Pecola Lawless, MD  furosemide (LASIX) 40 MG tablet TAKE 1 TABLET BY MOUTH 2 TIMES A DAY 07/11/12   Gaylord Shih, MD  HYDROmorphone (DILAUDID) 4 MG tablet Take 4 mg by mouth every 6 (six) hours as needed.    Historical Provider, MD  levothyroxine (SYNTHROID, LEVOTHROID) 75 MCG tablet Take 75 mcg by mouth daily. 03/19/12   Pecola Lawless, MD  lidocaine-prilocaine (EMLA) cream 1 application as needed.    Historical Provider, MD  metolazone (ZAROXOLYN) 2.5 MG tablet Take 1 tablet (2.5 mg total) by mouth as directed. 07/02/12   Dolores Patty, MD  metoprolol succinate (TOPROL-XL) 50 MG 24 hr tablet Take 1 tablet (50 mg total) by mouth daily. Take with or immediately following a meal. 05/14/12   Pecola Lawless, MD  polyethylene glycol [facility] Ethelene Hal) packet Take 17 g by mouth daily.    Historical Provider, MD  pomalidomide (POMALYST) 4 MG capsule Take 1 capsule (4 mg total) by mouth daily. for 21 days; then 7 days off 06/13/12   Ladene Artist, MD  potassium chloride SA (K-DUR,KLOR-CON) 20 MEQ tablet Take 1 tablet (20 mEq total) by  mouth 2 (two) times daily. 05/14/12   Pecola Lawless, MD  prochlorperazine (COMPAZINE) 10 MG tablet Take 10 mg by mouth every 6 (six) hours as needed. For nausea    Historical Provider, MD  ranitidine (ZANTAC) 150 MG tablet Take 150 mg by mouth as needed.    Historical Provider, MD  simvastatin (ZOCOR) 20 MG tablet Take 10 mg by mouth at bedtime. 03/19/12   Pecola Lawless, MD  traMADol (ULTRAM) 50 MG tablet Take 1 tablet (50 mg total) by mouth every 6 (six) hours as needed for pain. 06/28/12   Ladene Artist, MD   Physical Exam: Filed Vitals:   07/26/2012 1600 08/02/2012 1649  BP:  98/48  Pulse:  79  Temp: 97.7 F (36.5 C)   TempSrc: Oral   Resp:  23  Height:  5\' 4"  (1.626 m)  Weight:  70.3 kg (154 lb 15.7 oz)  SpO2:  100%     General:  Frail tired looking Caucasian female who appears to be stated age-vision by direct confrontation is normal, pupils equally reactive to light  Eyes: Mild pallor no icterus  ENT: Very good dentition no uvula deviation, smile is symmetrical,  Neck: JVD seems moderately elevated at around 4 cm  Cardiovascular: S1-S2, mildly tachycardic  Respiratory: Shallow breaths and increased work of breathing, no tactile vocal resonance or fremitus or rails or  rhonchi  Abdomen: Slightly tender over the left subcostal region  Skin: Grade 3 pitting edema lower extremity with a rash/possible hematoma over the small toe. Intertriginous areas do not appear to have any issues  Musculoskeletal: No swelling of any joints however she does have postop changes to both knees  Psychiatric: Euthymic  Neurologic: Cranial 2 through 12 grossly intact, motor 5 over 5, reflexes 2/3, coordination seems grossly intact but she is too weak to participate fully  Labs on Admission:  Basic Metabolic Panel:  Lab 08/04/2012 1610  NA 133*  K 5.0 Repeated and Verified  CL 102  CO2 14*  GLUCOSE 173*  BUN 49.0*  CREATININE 1.9*  CALCIUM 8.3*  MG --  PHOS --   Liver Function Tests: No results found for this basename: AST:5,ALT:5,ALKPHOS:5,BILITOT:5,PROT:5,ALBUMIN:5 in the last 168 hours No results found for this basename: LIPASE:5,AMYLASE:5 in the last 168 hours No results found for this basename: AMMONIA:5 in the last 168 hours CBC:  Lab 08/02/2012 0834  WBC 6.2  NEUTROABS 1.4*  HGB 6.8*  HCT 21.7*  MCV 99.5  PLT 39*   Cardiac Enzymes: No results found for this basename: CKTOTAL:5,CKMB:5,CKMBINDEX:5,TROPONINI:5 in the last 168 hours  BNP (last 3 results)  Basename 03/05/12 0500 03/04/12 0453 03/03/12 0500  PROBNP 4574.0* 5395.0* 6505.0*   CBG: No results found for this basename: GLUCAP:5 in the last 168 hours  Radiological Exams on Admission: No results found.  EKG: Independently reviewed. None performed currently  Assessment/Plan Principal Problem:  *Anemia Active Problems:  HYPOTHYROIDISM  WALDENSTROMS MACROGLOBULINEMIA  Multiple myeloma  Shortness of breath  Hyperkalemia   1. Symptomatic anemia-patient has already received one packed red blood cell transfusion of blood at the cancer center and she received one more unit. 2 repeat labs in the morning 2. Hypotension with possible hypotensive shock component-blood pressures are borderline  low and we will have told her regular blood pressure medications. I would hesitate to use IV fluids on her given her propensity for fluid overload-her last blood pressure at cardiology office on 06/24/2012 was 120/53 3. Decompensated acute diastolic dysfunction-patient will receive Lasix  IV 40 mg twice a day. She appears to have some significant volume depletion and we will have to be careful with regards to the use of this-given she has decompensated heart failure, I would hold her beta blocker at present time as this can cause decreased perfusion to an organs. There is a potential component of her being symptomatic anemic causing a high output heart failure as well. 4. Acute kidney injury-patient baseline he had creatinine is 16/0.99 on 04/04/2012. We will attempt to limit nephrotoxins. 5. Hypothyroid-we will continue her regular rocks and 6. Multiple myeloma and International aid/development worker. She is scheduled for Decadron tomorrow. We will hold on her other chemotherapeutics until oncology has seen the patient  Code Status: Patient requests DO NOT RESUSCITATE status we will honor this  Family Communication: Discussed fully with daughter in room. Her health care power of attorney is her husband who was not present Disposition Plan: Observe on step down status for blood transfusion and diuresis-potentially length of stay 2   Time spent: 60 minutes  Mahala Menghini [facility] Pager [phone]  If 7PM-7AM, please contact night-coverage [email or web address] Password [facility] 08/05/2012, 5:39 PM

## 2012-07-13 ENCOUNTER — Observation Stay (HOSPITAL_COMMUNITY): Payer: Medicare Other

## 2012-07-13 ENCOUNTER — Other Ambulatory Visit (HOSPITAL_COMMUNITY): Payer: Medicare Other

## 2012-07-13 DIAGNOSIS — I5032 Chronic diastolic (congestive) heart failure: Secondary | ICD-10-CM

## 2012-07-13 DIAGNOSIS — D696 Thrombocytopenia, unspecified: Secondary | ICD-10-CM | POA: Diagnosis present

## 2012-07-13 DIAGNOSIS — R109 Unspecified abdominal pain: Secondary | ICD-10-CM

## 2012-07-13 DIAGNOSIS — C9002 Multiple myeloma in relapse: Secondary | ICD-10-CM

## 2012-07-13 LAB — COMPREHENSIVE METABOLIC PANEL
ALT: 29 U/L (ref 0–35)
Alkaline Phosphatase: 47 U/L (ref 39–117)
BUN: 45 mg/dL — ABNORMAL HIGH (ref 6–23)
Chloride: 98 mEq/L (ref 96–112)
GFR calc Af Amer: 34 mL/min — ABNORMAL LOW (ref >90)
Glucose, Bld: 99 mg/dL (ref 70–99)
Potassium: 3.7 mEq/L (ref 3.5–5.1)
Sodium: 133 mEq/L — ABNORMAL LOW (ref 135–145)
Total Bilirubin: 2.3 mg/dL — ABNORMAL HIGH (ref 0.3–1.2)
Total Protein: 9.8 g/dL — ABNORMAL HIGH (ref 6.0–8.3)

## 2012-07-13 LAB — CBC
HCT: 26 % — ABNORMAL LOW (ref 36.0–46.0)
HCT: 26.5 % — ABNORMAL LOW (ref 36.0–46.0)
Hemoglobin: 9 g/dL — ABNORMAL LOW (ref 12.0–15.0)
MCH: 31.8 pg (ref 26.0–34.0)
MCHC: 34.2 g/dL (ref 30.0–36.0)
MCV: 93.9 fL (ref 78.0–100.0)
MCV: 93.6 fL (ref 78.0–100.0)
Platelets: 35 10*3/uL — ABNORMAL LOW (ref 150–400)
Platelets: 37 10*3/uL — ABNORMAL LOW (ref 150–400)
RBC: 2.83 MIL/uL — ABNORMAL LOW (ref 3.87–5.11)
RDW: 18.9 % — ABNORMAL HIGH (ref 11.5–15.5)
WBC: 6.3 10*3/uL (ref 4.0–10.5)

## 2012-07-13 MED ORDER — SODIUM CHLORIDE 0.9 % IV SOLN
INTRAVENOUS | Status: DC
  Administered 2012-07-13: 20 mL via INTRAVENOUS

## 2012-07-13 MED ORDER — ONDANSETRON HCL 4 MG/2ML IJ SOLN
INTRAMUSCULAR | Status: AC
  Administered 2012-07-13: 22:00:00
  Filled 2012-07-13: qty 2

## 2012-07-13 MED ORDER — ONDANSETRON HCL 4 MG/2ML IJ SOLN
4.0000 mg | Freq: Four times a day (QID) | INTRAMUSCULAR | Status: DC | PRN
  Administered 2012-07-14 – 2012-07-22 (×8): 4 mg via INTRAVENOUS
  Filled 2012-07-13 (×9): qty 2

## 2012-07-13 NOTE — Progress Notes (Signed)
UR completed 

## 2012-07-13 NOTE — Progress Notes (Addendum)
[facility] PROGRESS NOTE  [name] ZOX:[id number] DOB: 10-29-1925 DOA: 07/26/2012 PCP: Marga Melnick, MD  Brief narrative: 78 year old female with history of multiple myeloma who has completed cycle 6 melphalan/prednisone  12/29/2011. The IgM level was increased on 01/22/2012 and she subsequently began Pomalidomide on 02/26/2012 with weekly dexamethasone. She began cycle 3 on 04/26/2012 and subsequent serum M spike and IgM level were slightly lower on 03/18/2012 and now stable since 05/17/2012. Patient was sent form cancer center for acute blood loss anemia and possible CHF exacerbation.  Assessment and Plan:  Principal Problem: * Symptomatic anemia  Hemoglobin on admission 8.9  Patient has received 2 units PRBC's since admission  Will follow up post transfusion hemoglobin  No signs of active bleed  Active Problems: Acute decompensated diastolic CHF  Based on 2 D ECHO in 02/2012 EF 60-65% and BNP elevated at 4,700  We will repeat BNP today  Based on cardio note in 06/2012 dry weight 154.3 lbs and this admission 157 lbs  Continue lasix 40 mg BID IV  Daily weight and strict intake and output  F/U CXR  Replete electrolytes PRN  Thrombocytopenia  Likely due to multiple myeloma and sequela of chemotherapy  Appears to be around patient's baseline  Acute kidney injury  Likely secondary to diuretics  Will closely follow considering patient is still on lasix  Will d/c fluids as there is no sense to have IV fluids and lasix  Hypothyroidism  Continue synthroid 75 mcg daily  Multiple Myeloma  Management per oncology  Will continue current Q week dexamethasone  Peripheral neuropathy  Continue amitriptyline  Code Status: DNR Family Communication: 3 family members updated at bedside Disposition Plan: home when stable; transfer to telemetry floor; needs PT evaluation; anticipate d/c home in next 48 to 72 hours as patient looks preety well clinically  and all labs appear to be around her baseline  Manson Passey, MD  [facility] Pager [phone]  If 7PM-7AM, please contact night-coverage [email or web address] Password [email or web address] 07/13/2012, 7:33 AM   LOS: 1 day   Consultants:  Oncology monday  Procedures:  2 units PRBC's given on admission  Antibiotics:  None  HPI/Subjective: No acute events overnight. Patient feels better this morning.  Objective: Filed Vitals:   07/13/12 0000 07/13/12 0200 07/13/12 0400 07/13/12 0600  BP: 90/52 102/71 112/59 103/49  Pulse: 74 82 81 85  Temp: 98.6 F (37 C)  97.8 F (36.6 C)   TempSrc: Oral  Oral   Resp: 18   21  Height:      Weight:   71.623 kg (157 lb 14.4 oz)   SpO2: 97% 97% 96% 99%    Intake/Output Summary (Last 24 hours) at 07/13/12 0733 Last data filed at 07/13/12 0600  Gross per 24 hour  Intake    565 ml  Output   1250 ml  Net   -685 ml    Exam:   General:  Pt is alert, follows commands appropriately, not in acute distress  Cardiovascular: Regular rate and rhythm, S1/S2, no murmurs, no rubs, no gallops  Respiratory: Clear to auscultation bilaterally, diminished at bases breath sounds; no wheezing, no crackles, no rhonchi  Abdomen: Soft, non tender, non distended, bowel sounds present, no guarding  Extremities: LE (+1) pitting edema, pulses DP and PT palpable bilaterally  Neuro: Grossly nonfocal  Data Reviewed: Basic Metabolic Panel:  Lab 07/13/12 8295 08/02/2012 1829 07/11/2012 0834  NA 133* 134* 133*  K 3.7 4.2 5.0  CL 98 100 102  CO2 20 20 14*  GLUCOSE 99 110* 173*  BUN 45* 47* 49.0*  CREATININE 1.57* 1.64* 1.9*  CALCIUM 8.4 8.3* 8.3*   Liver Function Tests:  Lab 07/13/12 0430  AST 13  ALT 29  ALKPHOS 47  BILITOT 2.3*  PROT 9.8*  ALBUMIN 2.6*   CBC:  Lab 07/13/12 0430 07/13/12 0050 11-Aug-2012 0834  WBC 6.3 5.6 6.2  HGB 9.0* 8.9* 6.8*  HCT 26.5* 26.0* 21.7*  MCV 93.6 93.9 99.5  PLT 37* 35* 39*     MRSA by PCR NEGATIVE  NEGATIVE Final      Studies: No  results found.  Scheduled Meds:   . amitriptyline  20 mg Oral QHS  . dexamethasone  40 mg Oral Weekly  . famotidine  10 mg Oral Daily  . furosemide  40 mg Intravenous BID  . levothyroxine  75 mcg Oral Q breakfast  . pantoprazole  40 mg Oral Daily

## 2012-07-14 DIAGNOSIS — D696 Thrombocytopenia, unspecified: Secondary | ICD-10-CM

## 2012-07-14 DIAGNOSIS — C9001 Multiple myeloma in remission: Secondary | ICD-10-CM

## 2012-07-14 DIAGNOSIS — I509 Heart failure, unspecified: Secondary | ICD-10-CM

## 2012-07-14 DIAGNOSIS — I5033 Acute on chronic diastolic (congestive) heart failure: Secondary | ICD-10-CM

## 2012-07-14 LAB — CBC
HCT: 24.6 % — ABNORMAL LOW (ref 36.0–46.0)
Hemoglobin: 8.3 g/dL — ABNORMAL LOW (ref 12.0–15.0)
MCHC: 33.7 g/dL (ref 30.0–36.0)
RBC: 2.6 MIL/uL — ABNORMAL LOW (ref 3.87–5.11)

## 2012-07-14 LAB — BASIC METABOLIC PANEL
BUN: 42 mg/dL — ABNORMAL HIGH (ref 6–23)
CO2: 21 mEq/L (ref 19–32)
Chloride: 99 mEq/L (ref 96–112)
Glucose, Bld: 116 mg/dL — ABNORMAL HIGH (ref 70–99)
Potassium: 3.7 mEq/L (ref 3.5–5.1)

## 2012-07-14 MED ORDER — FUROSEMIDE 10 MG/ML IJ SOLN
40.0000 mg | Freq: Every day | INTRAMUSCULAR | Status: DC
  Filled 2012-07-14: qty 4

## 2012-07-14 NOTE — Progress Notes (Signed)
[facility] PROGRESS NOTE  [name] JWJ:[id number] DOB: 1926/01/13 DOA: 08/01/2012 PCP: Marga Melnick, MD  Brief narrative: 78 year old female with history of multiple myeloma who has completed cycle 6 melphalan/prednisone 12/29/2011. The IgM level was increased on 01/22/2012 and she subsequently began Pomalidomide on 02/26/2012 with weekly dexamethasone. She began cycle 3 on 04/26/2012 and subsequent serum M spike and IgM level were slightly lower on 03/18/2012 and now stable since 05/17/2012.   Assessment and Plan:   Principal Problem:  * Symptomatic anemia  Patient has received 2 units PRBC's since admission  Hemoglobin 8.3 today No signs of active bleed Follow up CBC in am  Active Problems:  Acute decompensated diastolic CHF  Based on 2 D ECHO in 02/2012 EF 60-65% and BNP elevated at 4,700  BNP on this admission 3,080 which is better than previous admission BNP (in 02/2012 of >4,000) Based on cardio note in 06/2012 dry weight 154.3 lbs and this admission 157 lbs  Weight today 156 lbs  Daily weight and strict intake and output  F/U CXR - moderate CHF Will decrease lasix to 40 daily IV and keep close monitoring of renal function Thrombocytopenia  Likely due to multiple myeloma and sequela of chemotherapy  Appears to be around patient's baseline Acute kidney injury  Likely secondary to diuretics  Will closely follow considering patient is still on lasix  D/C IV fluids, for some reason order placed but fluids not discontinued Hypothyroidism  Continue synthroid 75 mcg daily Multiple Myeloma  Management per oncology  Will continue current Q week dexamethasone Peripheral neuropathy  Continue amitriptyline  Code Status: DNR  Family Communication: 3 family members updated at bedside  Disposition Plan: home likely in next 24 hours if oncology agrees  Manson Passey, MD  [facility]  Pager [phone]   If 7PM-7AM, please contact night-coverage [email or web address] Password  [email or web address] 07/14/2012, 10:45 AM   LOS: 2 days   HPI/Subjective: Feels better.  Objective: Filed Vitals:   07/13/12 1343 07/13/12 2224 07/14/12 0654 07/14/12 0755  BP: 114/62 112/65 116/73   Pulse:  75 77   Temp: 97.8 F (36.6 C) 98.4 F (36.9 C) 97.3 F (36.3 C)   TempSrc: Oral Oral Oral   Resp: 18 20 18    Height: 5\' 4"  (1.626 m)     Weight: 71.215 kg (157 lb)   70.852 kg (156 lb 3.2 oz)  SpO2: 98% 97% 100%     Intake/Output Summary (Last 24 hours) at 07/14/12 1045 Last data filed at 07/14/12 0600  Gross per 24 hour  Intake    320 ml  Output      0 ml  Net    320 ml    Exam:   General:  Pt is alert, follows commands appropriately, not in acute distress  Cardiovascular: Regular rate and rhythm, S1/S2, no murmurs, no rubs, no gallops  Respiratory: Clear to auscultation bilaterally, no wheezing, no crackles, no rhonchi  Abdomen: Soft, non tender, non distended, bowel sounds present, no guarding  Extremities: pedal edema, pulses DP and PT palpable bilaterally  Neuro: Grossly nonfocal  Data Reviewed: Basic Metabolic Panel:  Lab 07/14/12 5784 07/13/12 0430 07/29/2012 1829 07/22/2012 0834  NA 135 133* 134* 133*  K 3.7 3.7 4.2 5.0   CL 99 98 100 102  CO2 21 20 20  14*  GLUCOSE 116* 99 110* 173*  BUN 42* 45* 47* 49.0*  CREATININE 1.62* 1.57* 1.64* 1.9*  CALCIUM 8.3* 8.4 8.3* 8.3*   Liver Function Tests:  Lab 07/13/12 0430  AST 13  ALT 29  ALKPHOS 47  BILITOT 2.3*  PROT 9.8*  ALBUMIN 2.6*   CBC:  Lab 07/14/12 0720 07/13/12 0430 07/13/12 0050 07/10/2012 0834  WBC 8.2 6.3 5.6 6.2  HGB 8.3* 9.0* 8.9* 6.8*  HCT 24.6* 26.5* 26.0* 21.7*  MCV 94.6 93.6 93.9 99.5  PLT 36* 37* 35* 39*    Recent Results (from the past 240 hour(s))  TECHNOLOGIST REVIEW     Status: Normal   Collection Time   08/06/2012  8:34 AM      Component Value Range Status Comment   Technologist Review     Final    Value: Few Metas and Myelocytes present, occ plasma cells, rouleaux  MRSA PCR  SCREENING     Status: Normal   Collection Time   07/20/2012  4:43 PM      Component Value Range Status Comment   MRSA by PCR NEGATIVE  NEGATIVE Final      Studies: Dg Chest Port 1 View  07/13/2012  *RADIOLOGY REPORT*  Clinical Data: CHF and anemia.  History of multiple myeloma.  PORTABLE CHEST - 1 VIEW  Comparison: 06/03/2012  Findings: There is evidence of moderate congestive heart failure and small bilateral pleural effusions.  Appearance of the Port-A- Cath is stable.  Stable heart size.  IMPRESSION: Moderate CHF with bilateral small pleural effusions.   Original Report Authenticated By: Reola Calkins, M.D.     Scheduled Meds:   . amitriptyline  20 mg Oral QHS  . dexamethasone  40 mg Oral Weekly  . famotidine  10 mg Oral Daily  . furosemide  40 mg Intravenous BID  . levothyroxine  75 mcg Oral Q breakfast  . ondansetron      . pantoprazole  40 mg Oral Daily   Continuous Infusions:   . sodium chloride 20 mL (07/13/12 2315)

## 2012-07-14 NOTE — Progress Notes (Signed)
Karen Barron   DOB:1926-09-25   ZO#:[id number]   JWJ#:[id number]  Subjective: patient sleeping in recliner, awakens easily, feel very tired, denies headaches, nausea/vomiting, pain, shortness of breath, cough, phlegm or pleurisy; BM yesterday; 4 grandchildren visited yesterday, one all the way from the [place]; 2 daughters in room   Objective: elderly White woman examined in recliner Filed Vitals:   07/14/12 0654  BP: 116/73  Pulse: 77  Temp: 97.3 F (36.3 C)  Resp: 18    Body mass index is 26.81 kg/(m^2).  Intake/Output Summary (Last 24 hours) at 07/14/12 0913 Last data filed at 07/14/12 0600  Gross per 24 hour  Intake    340 ml  Output      0 ml  Net    340 ml     Sclerae unicteric  Oropharynx clear, dry  Lungs clear -- no rales or rhonchi  Heart regular rate and rhythm  Abdomen benign  Neuro nonfocal  CBG (last 3)  No results found for this basename: GLUCAP:3 in the last 72 hours   Labs:  Lab Results  Component Value Date   WBC 8.2 07/14/2012   HGB 8.3* 07/14/2012   HCT 24.6* 07/14/2012   MCV 94.6 07/14/2012   PLT 36* 07/14/2012   NEUTROABS 1.4* 07/22/2012    @LASTCHEMISTRY @  Urine Studies No results found for this basename: UACOL:2,UAPR:2,USPG:2,UPH:2,UTP:2,UGL:2,UKET:2,UBIL:2,UHGB:2,UNIT:2,UROB:2,ULEU:2,UEPI:2,UWBC:2,URBC:2,UBAC:2,CAST:2,CRYS:2,UCOM:2,BILUA:2 in the last 72 hours  Basic Metabolic Panel:  Lab 07/14/12 6213 07/13/12 0430 08/04/2012 1829 07/28/2012 0834  NA 135 133* 134* 133*  K 3.7 3.7 -- --  CL 99 98 100 102  CO2 21 20 20  14*  GLUCOSE 116* 99 110* 173*  BUN 42* 45* 47* 49.0*  CREATININE 1.62* 1.57* 1.64* 1.9*  CALCIUM 8.3* 8.4 8.3* 8.3*  MG -- -- -- --  PHOS -- -- -- --   GFR Estimated Creatinine Clearance: 25 ml/min (by C-G formula based on Cr of 1.62). Liver Function Tests:  Lab 07/13/12 0430  AST 13  ALT 29  ALKPHOS 47  BILITOT 2.3*  PROT 9.8*  ALBUMIN 2.6*   No results found for this basename: LIPASE:5,AMYLASE:5 in the last 168  hours No results found for this basename: AMMONIA:5 in the last 168 hours Coagulation profile  Lab 07/13/12 0430  INR 1.44  PROTIME --    CBC:  Lab 07/14/12 0720 07/13/12 0430 07/13/12 0050 07/09/2012 0834  WBC 8.2 6.3 5.6 6.2  NEUTROABS -- -- -- 1.4*  HGB 8.3* 9.0* 8.9* 6.8*  HCT 24.6* 26.5* 26.0* 21.7*  MCV 94.6 93.6 93.9 99.5  PLT 36* 37* 35* 39*   Cardiac Enzymes: No results found for this basename: CKTOTAL:5,CKMB:5,CKMBINDEX:5,TROPONINI:5 in the last 168 hours BNP: No components found with this basename: POCBNP:5 CBG: No results found for this basename: GLUCAP:5 in the last 168 hours D-Dimer No results found for this basename: DDIMER:2 in the last 72 hours Hgb A1c No results found for this basename: HGBA1C:2 in the last 72 hours Lipid Profile No results found for this basename: CHOL:2,HDL:2,LDLCALC:2,TRIG:2,CHOLHDL:2,LDLDIRECT:2 in the last 72 hours Thyroid function studies No results found for this basename: TSH,T4TOTAL,FREET3,T3FREE,THYROIDAB in the last 72 hours Anemia work up No results found for this basename: VITAMINB12:2,FOLATE:2,FERRITIN:2,TIBC:2,IRON:2,RETICCTPCT:2 in the last 72 hours Microbiology Recent Results (from the past 240 hour(s))  TECHNOLOGIST REVIEW     Status: Normal   Collection Time   07/11/2012  8:34 AM      Component Value Range Status Comment   Technologist Review     Final  Value: Few Metas and Myelocytes present, occ plasma cells, rouleaux  MRSA PCR SCREENING     Status: Normal   Collection Time   07/25/2012  4:43 PM      Component Value Range Status Comment   MRSA by PCR NEGATIVE  NEGATIVE Final       Studies:  Dg Chest Port 1 View  07/13/2012  *RADIOLOGY REPORT*  Clinical Data: CHF and anemia.  History of multiple myeloma.  PORTABLE CHEST - 1 VIEW  Comparison: 06/03/2012  Findings: There is evidence of moderate congestive heart failure and small bilateral pleural effusions.  Appearance of the Port-A- Cath is stable.  Stable heart  size.  IMPRESSION: Moderate CHF with bilateral small pleural effusions.   Original Report Authenticated By: Reola Calkins, M.D.     Assessment: 78 y.o. [place] woman admitted with anemia, thrombocytopenia, diastolic dysfunction 1. Multiple myeloma treated with subcutaneous Velcade and Cytoxan/Decadron on a weekly schedule beginning in May 2011. She completed 1 year of treatment on 01/24/2011. The serum M-spike and IgM level were increased beginning in May 2012. She began treatment with carfilzomib on 05/25/2011. She completed 2 cycles. The serum M-spike and IgM level were higher on 07/12/2011. She began cycle 1 melphalan/prednisone 07/25/2011. The IgM level was improved on 08/21/2011. She began cycle 2 melphalan/prednisone on 08/29/2011. The melphalan dose was reduced to 8 mg daily for 4 days beginning with cycle 2. The melphalan was dose reduced to 6 mg daily for 4 days beginning with the third cycle of melphalan/prednisone on 09/27/2011. She completed a fourth cycle of melphalan/prednisone beginning on 10/25/2011. The IgM level was stable on 11/15/2011. She completed cycle 6 melphalan/prednisone beginning 12/29/2011. The IgM level was increased on 01/22/2012. She began Pomalidomide on 02/26/2012 with weekly dexamethasone. She began cycle 3 on 04/26/2012. The serum M spike and IgM level were slightly lower on 03/18/2012. The IgM level was improved on 04/12/2012, stable 05/17/2012. She completed cycle 4 Pomalidomide beginning 05/24/2012. The IgM level was stable and the serum M spike was improved on 06/14/2012. 2. Hospitalization with shortness of breath/acute diastolic heart failure 02/26/2012 through 03/11/2012. 3. Hospitalization with dyspnea/hypoxia secondary to volume overload in the setting of diastolic heart failure on 05/26/2011. 4. Hospitalization 06/02/2011 with dyspnea. She was found to have marked anemia with a hemoglobin of 6.7. She received a red cell transfusion 09/27/2011. 5. History  of congestive heart failure status post hospital admission 05/26/2011. 6. Thrombocytopenia secondary to multiple myeloma and pomalidomide. Stable. 7. T7 compression fracture status post kyphoplasty 11/23/2007 by Dr. Noel Gerold. 8. Peripheral neuropathy status post evaluation by Dr. Anne Hahn. The neuropathy is most likely related to Velcade therapy. 9. Herpes zoster at the left abdomen and chest wall status post Valtrex therapy. 10. Postherpetic neuralgia. 11. History of neutropenia secondary to Velcade and Cytoxan. There is persistent neutropenia. 12. Pain and swelling of the proximal interphalangeal joint of the right 3rd finger when she was here on 02/21/2011, improved. She was treated with a Medrol Dosepak. 13. Cough, wheezing, dyspnea, and pleuritic right-sided chest pain and here on 01/22/2012. Chest CT showed right upper lobe pneumonia. She completed a 10 day course of Avelox on 01/31/2012. 14. Persistent severe anemia. She last transfused on 07/26/2012 15. Left wrist pain/tenderness, erythema and edema-02/02/2012,? Gout versus a joint infection. Improved after Keflex and a Medrol Dosepak. 16. Fever/chills 06/03/2012 with no localizing source for infection. Urinalysis was unremarkable. A blood culture was negative. Chest x-ray was negative. She completed a course of Levaquin. She had no  further fever.   Plan: appreciate hospitalist service's help to this patient; Dr Truett Perna will follow in AM.   Ruthann Cancer C 07/14/2012

## 2012-07-15 DIAGNOSIS — I369 Nonrheumatic tricuspid valve disorder, unspecified: Secondary | ICD-10-CM

## 2012-07-15 DIAGNOSIS — C9000 Multiple myeloma not having achieved remission: Principal | ICD-10-CM

## 2012-07-15 MED ORDER — FUROSEMIDE 10 MG/ML IJ SOLN
40.0000 mg | Freq: Two times a day (BID) | INTRAMUSCULAR | Status: DC
  Administered 2012-07-15: 40 mg via INTRAVENOUS
  Filled 2012-07-15 (×4): qty 4

## 2012-07-15 MED ORDER — ENSURE PUDDING PO PUDG
1.0000 | Freq: Three times a day (TID) | ORAL | Status: DC
  Administered 2012-07-15 – 2012-07-19 (×8): 1 via ORAL
  Filled 2012-07-15 (×25): qty 1

## 2012-07-15 NOTE — Progress Notes (Signed)
*  Echocardiogram 2D Echocardiogram has been performed.  Cathie Beams 07/15/2012, 3:27 PM

## 2012-07-15 NOTE — Consult Note (Signed)
CARDIOLOGY CONSULT NOTE   Patient ID: Karen Barron MRN: [id number] DOB/AGE: February 06, 1926 78 y.o.  Admit date: 07/25/2012  Primary Physician   Marga Melnick, MD Primary Cardiologist   TW Reason for Consultation   CHF  [id number] B Ripp is a 78 y.o. female with a history of CHF and anemia from multiple myeloma. Karen Barron was transfused 1 unit on 06/28/2012 and came back 10/4 for routine blood work which showed H&H 6.8/21.7. Karen Barron was transfused 1 unit PRBCs but was already having increased SOB/WOB and LE edema. Karen Barron was hypotensive and was admitted for transfusion and diuresis. Her dry weight is felt to be 154 lbs. Currently her weight is 156. Cardiology is asked to assist in CHF management.  Karen Barron states Karen Barron is doing better than when Karen Barron came to the hospital. Her husband concurs with this, saying Karen Barron could not walk 10 feet without significant assistance. Currently, Karen Barron is able to walk about 10 feet with only minimal assistance. This level of exertion does make her short of breath. Karen Barron also becomes short of breath with eating or drinking. Karen Barron describes orthopnea and is up very frequently at night to urinate, so is not aware of any PND. Karen Barron denies chest pain or palpitations. Her lower extremity edema has improved since admission.  Past Medical History  Diagnosis Date  . Personal history of colonic polyps   . Osteoporosis   . Waldenstrom's macroglobulinemia   . Diverticular disease   . Thyroid disease   . Fatty liver   . Hypertension   . Diabetes in pregnancy   . Hyperlipidemia   . Spinal stenosis   . Asthma   . Anemia   . DJD (degenerative joint disease)   . Esophageal reflux   . Hiatal hernia   . Chronic diastolic CHF (congestive heart failure)     Echo on 05/30/11 revealed nl LV systolic function, EF 65-70%, no WMAs, grade 2 diastolic dysfunction, mildly dilated RA/RV, peak PA pressure  . Multiple myeloma in remission   . CHF (congestive heart failure) 5/20-03/11/12     Past  Surgical History  Procedure Date  . Total abdominal hysterectomy     due to fibroids  . Appendectomy   . Kyphosis surgery   . Cataract extraction   . Colonoscopy w/ polypectomy   . Total knee arthroplasty     bilateral  . Lumbar fusion   . Portacath placement 04/15/2009    Allergies  Allergen Reactions  . Cladribine Other (See Comments)  . Codeine Other (See Comments)    don't remember   . Guaifenesin & Derivatives Other (See Comments)    Elevates BP  . Indomethacin Other (See Comments)    Does not remember reaction   . Naproxen Sodium Other (See Comments)    Does not remember reaction   . Risedronate Sodium     I have reviewed the patient's current medications    . amitriptyline  20 mg Oral QHS  . dexamethasone  40 mg Oral Weekly  . famotidine  10 mg Oral Daily  . feeding supplement  1 Container Oral TID BM  . furosemide  40 mg Intravenous Q12H  . levothyroxine  75 mcg Oral Q breakfast  . pantoprazole  40 mg Oral Daily  . DISCONTD: furosemide  40 mg Intravenous Daily     albuterol, heparin lock flush, HYDROmorphone, ondansetron  Medication Sig  AMBULATORY NON FORMULARY MEDICATION Lab Order (FASTING): Lipid/Hep/TSH  DX: 272.4/995.20/244.9/244.9  amitriptyline (ELAVIL) 10 MG tablet Take  2 tablets (20 mg total) by mouth at bedtime.  dexamethasone (DECADRON) 4 MG tablet Take 40 mg by mouth once a week. On Saturday.  diphenoxylate-atropine (LOMOTIL) 2.5-0.025 MG per tablet Take 1 tablet by mouth 4 (four) times daily as needed.  esomeprazole (NEXIUM) 20 MG capsule Take 1 capsule (20 mg total) by mouth daily before breakfast.  furosemide (LASIX) 40 MG tablet TAKE 1 TABLET BY MOUTH 2 TIMES A DAY  HYDROmorphone (DILAUDID) 4 MG tablet Take 4 mg by mouth every 6 (six) hours as needed.  levothyroxine (SYNTHROID, LEVOTHROID) 75 MCG tablet Take 75 mcg by mouth daily.  lidocaine-prilocaine (EMLA) cream 1 application as needed.  metoprolol succinate (TOPROL-XL) 50 MG 24 hr  tablet Take 1 tablet (50 mg total) by mouth daily. Take with or immediately following a meal.  pomalidomide (POMALYST) 4 MG capsule Take 1 capsule (4 mg total) by mouth daily. for 21 days; then 7 days off  pomalidomide (POMALYST) 4 MG capsule Take 4 mg by mouth daily. Take one capsule by mouth for 21 days;then take one capsule by mouth for 7 days then stop  potassium chloride SA (K-DUR,KLOR-CON) 20 MEQ tablet Take 1 tablet (20 mEq total) by mouth 2 (two) times daily.  prochlorperazine (COMPAZINE) 10 MG tablet Take 10 mg by mouth every 6 (six) hours as needed. For nausea  ranitidine (ZANTAC) 150 MG tablet Take 150 mg by mouth as needed.  simvastatin (ZOCOR) 20 MG tablet Take 10 mg by mouth at bedtime.  traMADol (ULTRAM) 50 MG tablet Take 1 tablet (50 mg total) by mouth every 6 (six) hours as needed for pain.     History   Social History  . Marital Status: Married    Spouse Name: N/A    Number of Children: 2  . Years of Education: N/A   Occupational History  . Retired     Social History Main Topics  . Smoking status: Never Smoker   . Smokeless tobacco: Never Used  . Alcohol Use: No  . Drug Use: No  . Sexually Active: Not Currently   Other Topics Concern  . Not on file   Social History Narrative   No caffeine drinks     Family History  Problem Relation Age of Onset  . Leukemia Father   . Osteoporosis Mother   . Cancer Sister     CNS  . Melanoma Sister      ROS: Karen Barron has significant weakness. Karen Barron has no appetite and eats very poorly. Karen Barron has not had fevers or chills. Karen Barron has not had cough or cold symptoms. Karen Barron has not had a bowel movement today but denies constipation. Full 14 point review of systems complete and found to be negative unless listed above.  Physical Exam: Blood pressure 133/80, pulse 98, temperature 97.7 F (36.5 C), temperature source Oral, resp. rate 18, height 5\' 4"  (1.626 m), weight 156 lb 8 oz (70.988 kg), SpO2 94.00%.  General: Well developed, well  nourished, elderly female in no acute distress Head: Eyes PERRLA, No xanthomas.   Normocephalic and atraumatic, oropharynx without edema or exudate. Dentition: poor Lungs: bilateral basilar rales Heart: HRRR S1 S2, no rub/gallop, no significant  murmur. pulses are 2+ bilateral upper extrem, slightly decreased in bilateral lower extremities.   Neck: No carotid bruits. No lymphadenopathy.  JVD is elevated at 10-12 cm. Abdomen: Bowel sounds present, abdomen soft and non-tender but seems distended, without masses or hernias noted. Msk:  No spine or cva tenderness. Generalized weakness, no joint  deformities or effusions. Extremities: No clubbing or cyanosis.  Trace edema.  Neuro: Alert and oriented X 3. No focal deficits noted. Psych:  Good affect, responds appropriately Skin: No rashes or lesions noted.  Labs: Lab Results  Component Value Date   WBC 8.2 07/14/2012   HGB 8.3* 07/14/2012   HCT 24.6* 07/14/2012   MCV 94.6 07/14/2012   PLT 36* 07/14/2012    Basename 07/13/12 0430  INR 1.44    Lab 07/14/12 0720 07/13/12 0430  07/10/2012   07/04/2012   NA 135  133   134   136   K 3.7 3.7   4.2   3.5   CL 99  98   100   97   CO2 21  20   20 23   BUN 42*  45   47   49   CREATININE 1.62*  1.57   1.64   1.9  CALCIUM 8.3*  8.4  8.3 9.1    GFR   28   29   28     BILITOT -- 2.3*    ALKPHOS -- 47    ALT -- 29    AST -- 13    GLUCOSE 116*  99 110   206    Pro B Natriuretic peptide (BNP)  Date/Time Value Range Status  07/13/2012  4:30 AM 3082.0* 0 - 450 pg/mL Final  03/05/2012  5:00 AM 4574.0* 0 - 450 pg/mL Final   Echo: 02/27/2012 Study Conclusions - Left ventricle: The cavity size was normal. Wall thickness was normal. Systolic function was normal. The estimated ejection fraction was in the range of 60% to 65%. Wall motion was normal; there were no regional wall motion abnormalities. Features are consistent with a pseudonormal left ventricular filling pattern, with concomitant abnormal  relaxation and increased filling pressure (grade 2 diastolic dysfunction). - Mitral valve: Calcified annulus. Mild regurgitation. - Left atrium: The atrium was mildly dilated. - Pulmonary arteries: Systolic pressure was moderately increased. PA peak pressure: 57mm Hg (S).  Radiology:  Dg Chest Port 1 View 07/13/2012  *RADIOLOGY REPORT*  Clinical Data: CHF and anemia.  History of multiple myeloma.  PORTABLE CHEST - 1 VIEW  Comparison: 06/03/2012  Findings: There is evidence of moderate congestive heart failure and small bilateral pleural effusions.  Appearance of the Port-A- Cath is stable.  Stable heart size.  IMPRESSION: Moderate CHF with bilateral small pleural effusions.   Original Report Authenticated By: Reola Calkins, M.D.     ASSESSMENT AND PLAN:   The patient was seen today by Dr Elease Hashimoto, the patient evaluated and the data reviewed.  Principal Problem:  *Diastolic CHF, acute on chronic - Has been on IV Lasix but not much change in weight and still seems significantly SOB - Karen Barron may be intravascularly dry even though Karen Barron is still short of breath. Her SOB may also be from other causes. Karen Barron is at risk for PE and we feel a d-dimer would be elevated so we will do a VQ scan and check lower extremity Dopplers. We will check a 2-D echocardiogram to re-assess her heart muscle function, PAS and look for right-sided strain.  Renal insufficiency - Her BUN/Cr are significantly worse than they were in September. Consider renal disease from the multiple myeloma, renal ultrasound may be helpful. We will leave this to the primary team, but continue to follow her renal function closely with diuresis.  Poor appetite - patient has not eaten well in a long time. Some of this is  due to her shortness of breath, but her appetite is poor. Megace can be considered but we will leave this to the primary team.  Otherwise, per primary M.D. and oncology Active Problems:  HYPOTHYROIDISM  WALDENSTROMS  MACROGLOBULINEMIA  PERIPHERAL NEUROPATHY, LOWER EXTREMITIES, BILATERAL  GERD  Multiple myeloma  Thrombocytopenia   Signed: Theodore Demark 07/15/2012, 11:34 AM Co-Sign MD  Attending Note:   The patient was seen and examined.  Agree with assessment and plan as noted above.  Talked with patient and husband - examined patient.  Pt has deteoriated over the past 2 months.  Poor appetite - worsening renal function, persistent anemia.  Karen Barron has chronic diastolic CHf and has moderate pulmonary hypertension.  Karen Barron has MM and is at increased risk for DVT / PE.    On exam, Karen Barron has no JVD, decreased skin turgor, distended abdomen.  Trace edema in legs  I would repeat echo for evaluation of RV size and  Fxn.  I think Karen Barron needs to be evaluated for PE - probably would get a VQ scan given her renal insufficiency.  We could try extra lasix but this may result in worsening renal fxn - I would watch closely for increasing creatinine.  Following.  Vesta Mixer, Montez Hageman., MD, [facility] 07/15/2012, 1:29 PM

## 2012-07-15 NOTE — Evaluation (Signed)
Physical Therapy Evaluation Patient Details Name: Karen Barron MRN: [id number] DOB: 1926-02-20 Today's Date: 07/15/2012 Time: 1110-1130 PT Time Calculation (min): 20 min  PT Assessment / Plan / Recommendation Clinical Impression  78 yo female admitted with CHR, anemia. Husband present during eval. Pt demonstrates general weakness, decreased activity tolerance. Husband states plan is for home with family assisting. Recommend HHPT.     PT Assessment  Patient needs continued PT services    Follow Up Recommendations  Home health PT;Supervision/Assistance - 24 hour    Does the patient have the potential to tolerate intense rehabilitation      Barriers to Discharge        Equipment Recommendations  None recommended by PT    Recommendations for Other Services OT consult   Frequency Min 3X/week    Precautions / Restrictions Precautions Precautions: Fall Restrictions Weight Bearing Restrictions: No   Pertinent Vitals/Pain Abdomen-unrated.      Mobility  Bed Mobility Bed Mobility: Not assessed Details for Bed Mobility Assistance: Pt sitting in recliner Transfers Transfers: Sit to Stand;Stand to Sit Sit to Stand: 4: Min assist;From chair/3-in-1;With armrests Stand to Sit: 4: Min assist;To chair/3-in-1;With armrests Details for Transfer Assistance: Assist to rise, stabilize, control descent. VCs safety, hand placement.  Ambulation/Gait Ambulation/Gait Assistance: 4: Min assist Ambulation Distance (Feet): 45 Feet Assistive device: Rolling walker Ambulation/Gait Assistance Details: VCs safety,  posture, distance from RW. Assist to stabilize throughout ambulation. Moderate encouragement for distance. Pt fatigues easily.  Gait Pattern: Step-through pattern;Trunk flexed    Shoulder Instructions     Exercises General Exercises - Lower Extremity Ankle Circles/Pumps: AROM;Both;10 reps;Seated Quad Sets: AROM;Both;10 reps;Seated Long Arc Quad: AROM;Both;5 reps;Seated   PT  Diagnosis: Difficulty walking;Generalized weakness  PT Problem List: Decreased strength;Decreased activity tolerance;Decreased mobility;Decreased balance;Decreased knowledge of use of DME PT Treatment Interventions: DME instruction;Gait training;Functional mobility training;Therapeutic activities;Therapeutic exercise;Patient/family education   PT Goals Acute Rehab PT Goals PT Goal Formulation: With patient/family Time For Goal Achievement: 07/29/12 Potential to Achieve Goals: Good Pt will go Supine/Side to Sit: with supervision PT Goal: Supine/Side to Sit - Progress: Goal set today Pt will go Sit to Supine/Side: with supervision PT Goal: Sit to Supine/Side - Progress: Goal set today Pt will go Sit to Stand: with supervision PT Goal: Sit to Stand - Progress: Goal set today Pt will Ambulate: 51 - 150 feet;with supervision;with rolling walker PT Goal: Ambulate - Progress: Goal set today Pt will Perform Home Exercise Program: with supervision, verbal cues required/provided PT Goal: Perform Home Exercise Program - Progress: Goal set today  Visit Information  Last PT Received On: 07/15/12 Assistance Needed: +1    Subjective Data  Subjective: "I'm so weak" Patient Stated Goal: Get better. Home   Prior Functioning  Home Living Lives With: Spouse Available Help at Discharge: Family Type of Home: House Home Access: Level entry Home Layout: Able to live on main level with bedroom/bathroom;Two level Bathroom Toilet: Standard Home Adaptive Equipment: Walker - rolling;Quad cane Prior Function Level of Independence: Needs assistance Needs Assistance: Bathing;Dressing;Gait;Transfers Comments: husband states pt "needs help with everything." Communication Communication: No difficulties    Cognition  Overall Cognitive Status: Difficult to assess Difficult to assess due to:  (pt spoke very little during session. Husband repsonded most) Arousal/Alertness: Lethargic Behavior During Session:  Lethargic    Extremity/Trunk Assessment Right Lower Extremity Assessment RLE ROM/Strength/Tone: Deficits RLE ROM/Strength/Tone Deficits: Strength at least 3+/5 except hip flexion 3-/5 Left Lower Extremity Assessment LLE ROM/Strength/Tone: Deficits LLE ROM/Strength/Tone Deficits: Strength at  least 3+/5 except hip flexion 3-/5   Balance    End of Session PT - End of Session Equipment Utilized During Treatment: Gait belt Activity Tolerance: Patient limited by fatigue Patient left: in chair;with call bell/phone within reach;with family/visitor present  GP     Rebeca Alert [facility] 07/15/2012, 12:13 PM [phone]

## 2012-07-15 NOTE — Progress Notes (Signed)
[name]   DOB:1926-08-29   WU#:[id number]   [id number]  Subjective: She is up in the chair this morning. She reports feeling much better compared to when she was admitted on August 07, 2012. The dyspnea has improved.  Objective: Filed Vitals:   07/15/12 0640  BP: 133/80  Pulse: 98  Temp: 97.7 F (36.5 C)  Resp: 18    Body mass index is 26.86 kg/(m^2).  Intake/Output Summary (Last 24 hours) at 07/15/12 1405 Last data filed at 07/14/12 2200  Gross per 24 hour  Intake    120 ml  Output      0 ml  Net    120 ml       Lungs clear -- decreased breath sounds at the bases, no respiratory distress  Heart regular rate and rhythm  Vascular-no leg edema             Port-A-Cath without erythema   Labs:  Lab Results  Component Value Date   WBC 8.2 07/14/2012   HGB 8.3* 07/14/2012   HCT 24.6* 07/14/2012   MCV 94.6 07/14/2012   PLT 36* 07/14/2012   NEUTROABS 1.4* 2012-08-07    IgM 7780 on 08-07-12   Basic Metabolic Panel:  Lab 07/14/12 2956 07/13/12 0430 08/07/2012 1829 2012-08-07 0834  NA 135 133* 134* 133*  K 3.7 3.7 -- --  CL 99 98 100 102  CO2 21 20 20  14*  GLUCOSE 116* 99 110* 173*  BUN 42* 45* 47* 49.0*  CREATININE 1.62* 1.57* 1.64* 1.9*  CALCIUM 8.3* 8.4 8.3* 8.3*  MG -- -- -- --  PHOS -- -- -- --   GFR Estimated Creatinine Clearance: 25 ml/min (by C-G formula based on Cr of 1.62). Liver Function Tests:  Lab 07/13/12 0430  AST 13  ALT 29  ALKPHOS 47  BILITOT 2.3*  PROT 9.8*  ALBUMIN 2.6*   CBC:  Lab 07/14/12 0720 07/13/12 0430 07/13/12 0050 08-07-12 0834  WBC 8.2 6.3 5.6 6.2  NEUTROABS -- -- -- 1.4*  HGB 8.3* 9.0* 8.9* 6.8*  HCT 24.6* 26.5* 26.0* 21.7*  MCV 94.6 93.6 93.9 99.5  PLT 36* 37* 35* 39*     Studies:  No results found.  Assessment: 78 y.o. [place] woman admitted with anemia, thrombocytopenia, diastolic dysfunction 1. Multiple myeloma treated with subcutaneous Velcade and Cytoxan/Decadron on a weekly schedule beginning in May 2011.  She completed 1 year of treatment on 01/24/2011. The serum M-spike and IgM level were increased beginning in May 2012. She began treatment with carfilzomib on 05/25/2011. She completed 2 cycles. The serum M-spike and IgM level were higher on 07/12/2011. She began cycle 1 melphalan/prednisone 07/25/2011. The IgM level was improved on 08/21/2011. She began cycle 2 melphalan/prednisone on 08/29/2011. The melphalan dose was reduced to 8 mg daily for 4 days beginning with cycle 2. The melphalan was dose reduced to 6 mg daily for 4 days beginning with the third cycle of melphalan/prednisone on 09/27/2011. She completed a fourth cycle of melphalan/prednisone beginning on 10/25/2011. The IgM level was stable on 11/15/2011. She completed cycle 6 melphalan/prednisone beginning 12/29/2011. The IgM level was increased on 01/22/2012. She began Pomalidomide on 02/26/2012 with weekly dexamethasone. She began cycle 3 on 04/26/2012. The serum M spike and IgM level were slightly lower on 03/18/2012. The IgM level was improved on 04/12/2012, stable 05/17/2012. She completed cycle 4 Pomalidomide beginning 05/24/2012. The IgM level was stable and the serum M spike was improved on 06/14/2012. She began cycle 5 of pomalidomide on  06/21/2012. The IgM level was higher on Aug 11, 2012 to 2. Hospitalization with shortness of breath/acute diastolic heart failure 02/26/2012 through 03/11/2012. 3. Hospitalization with dyspnea/hypoxia secondary to volume overload in the setting of diastolic heart failure on 05/26/2011. 4. Hospitalization 06/02/2011 with dyspnea. She was found to have marked anemia with a hemoglobin of 6.7. She received a red cell transfusion 09/27/2011. 5. History of congestive heart failure status post hospital admission 05/26/2011. 6. Thrombocytopenia secondary to multiple myeloma and pomalidomide. Stable. 7. T7 compression fracture status post kyphoplasty 11/23/2007 by Dr. Noel Gerold. 8. Peripheral neuropathy status post  evaluation by Dr. Anne Hahn. The neuropathy is most likely related to Velcade therapy. 9. Herpes zoster at the left abdomen and chest wall status post Valtrex therapy. 10. Postherpetic neuralgia. 11. History of neutropenia secondary to Velcade and Cytoxan. There is persistent neutropenia. 12. Pain and swelling of the proximal interphalangeal joint of the right 3rd finger when she was here on 02/21/2011, improved. She was treated with a Medrol Dosepak. 13. Cough, wheezing, dyspnea, and pleuritic right-sided chest pain and here on 01/22/2012. Chest CT showed right upper lobe pneumonia. She completed a 10 day course of Avelox on 01/31/2012. 14. Persistent severe anemia. She last transfused on August 11, 2012 15. Left wrist pain/tenderness, erythema and edema-02/02/2012,? Gout versus a joint infection. Improved after Keflex and a Medrol Dosepak. 16. Fever/chills 06/03/2012 with no localizing source for infection. Urinalysis was unremarkable. A blood culture was negative. Chest x-ray was negative. She completed a course of Levaquin. She had no further fever.  Karen Barron was admitted with dyspnea. I suspect the dyspnea is related to congestive heart failure in the setting of severe anemia. She has been admitted on several occasions in the past and her symptoms have responded to transfusion and diuresis. She appears more comfortable today.  The persistent severe anemia, thrombocytopenia, and a markedly elevated IgM level suggest progression of the multiple myeloma. I discussed this with [name] and her daughter this morning. She has been treated with multiple systemic therapy regimens over the past few years. Salvage treatment options are limited. I will review her treatment history and recommend another salvage regimen versus hospice care.  I appreciate the care from Dr. Elisabeth Pigeon. We will followup on the repeat IgM level and add a serum protein electrophoresis.    Karen Barron 07/15/2012

## 2012-07-15 NOTE — Progress Notes (Addendum)
[facility] PROGRESS NOTE  [name] AVW:[id number] DOB: 12-28-1925 DOA: 08/03/2012 PCP: Marga Melnick, MD  Brief narrative: 78 year old very pleasant female with multiple medical comorbidities including but not limited to multiple myeloma who has completed cycle 6 melphalan/prednisone 12/29/2011. The IgM level was increased on 01/22/2012 and she subsequently began Pomalidomide on 02/26/2012 with weekly dexamethasone. She began cycle 3 on 04/26/2012 and subsequent serum M spike and IgM level were slightly lower on 03/18/2012 and stable since 05/17/2012. The most recent IgM around 7700. In addition, her hospital course is complicated due to decompensated CHF for which she is taking lasix IV while we are closely monitoring her renal function (appears to be in 1.6 - 1.9 range).   Assessment and Plan:   Principal Problem:  * Symptomatic anemia  Patient has received 2 units PRBC's since admission  Hemoglobin stable  No signs of active bleed  Will follow up CBC in am  Active Problems:  Acute decompensated diastolic CHF  Based on 2 D ECHO in 02/2012 EF 60-65% and BNP elevated at 4,700  BNP on this admission 3,080 which is better than previous admission BNP (in 02/2012 of >4,000)  Based on cardio note in 06/2012 dry weight 154.3 lbs and on this admission her weight was 157 lbs  Weight yesterday 156 and today 157.5 lbs Will increase lasix frequency to 40 mg Q 12 hours IV Appreciate cardio consult We will continue daily weight and strict intake and output  F/U CXR 07/14/2012 - moderate CHF  Thrombocytopenia  Likely due to multiple myeloma and sequela of chemotherapy  Appears to be around patient's baseline Acute kidney injury  Likely secondary to diuretics  Hypothyroidism  Continue synthroid 75 mcg daily Multiple Myeloma  Management per oncology  Will repeat IgM today to make sure not lab error Will continue current Q week (saturday) dexamethasone Peripheral neuropathy    Continue amitriptyline Sacral decubitus ulcer  appreciate wound care consult   Code Status: DNR  Family Communication: family updated at bedside  Disposition Plan: home when stable and to resume home health PT/OT/RN  Manson Passey, MD  [facility]  Pager [phone]  Consultants:  Oncology  Cardiology   Other consults:  Physical therapy  Procedure:  None  Antibiotics:  None   If 7PM-7AM, please contact night-coverage [email or web address] Password [email or web address] 07/15/2012, 11:40 AM   LOS: 3 days   HPI/Subjective: No acute events overnight.  Objective: Filed Vitals:   07/14/12 1612 07/14/12 2311 07/15/12 0640 07/15/12 0900  BP: 101/40 111/73 133/80   Pulse: 77 90 98   Temp: 98.7 F (37.1 C) 97.9 F (36.6 C) 97.7 F (36.5 C)   TempSrc: Oral Oral Oral   Resp: 18 18 18    Height:      Weight:   71.625 kg (157 lb 14.5 oz) 70.988 kg (156 lb 8 oz)  SpO2: 95% 94% 94%     Intake/Output Summary (Last 24 hours) at 07/15/12 1140 Last data filed at 07/14/12 2200  Gross per 24 hour  Intake    120 ml  Output      0 ml  Net    120 ml    Exam:   General:  Pt is alert, follows commands appropriately, not in acute distress  Cardiovascular: Regular rate and rhythm, S1/S2, no murmurs, no rubs, no gallops  Respiratory: Clear to auscultation bilaterally, no wheezing, no crackles, no rhonchi  Abdomen: Soft, non tender, non distended, bowel sounds present, no guarding  Extremities: pedal pitting edema  stable , pulses DP and PT palpable bilaterally  Neuro: Grossly nonfocal  Data Reviewed: Basic Metabolic Panel:  Lab 07/14/12 8295 07/13/12 0430 07/22/2012 1829 07/11/2012 0834  NA 135 133* 134* 133*  K 3.7 3.7 4.2 5.0 Repeated and Verified  CL 99 98 100 102  CO2 21 20 20  14*  GLUCOSE 116* 99 110* 173*  BUN 42* 45* 47* 49.0*  CREATININE 1.62* 1.57* 1.64* 1.9*  CALCIUM 8.3* 8.4 8.3* 8.3*  MG -- -- -- --  PHOS -- -- -- --   Liver Function Tests:  Lab 07/13/12 0430  AST 13  ALT 29   ALKPHOS 47  BILITOT 2.3*  PROT 9.8*  ALBUMIN 2.6*   CBC:  Lab 07/14/12 0720 07/13/12 0430 07/13/12 0050 07/30/2012 0834  WBC 8.2 6.3 5.6 6.2  NEUTROABS -- -- -- 1.4*  HGB 8.3* 9.0* 8.9* 6.8*  HCT 24.6* 26.5* 26.0* 21.7*  MCV 94.6 93.6 93.9 99.5  PLT 36* 37* 35* 39*    Recent Results (from the past 240 hour(s))  TECHNOLOGIST REVIEW     Status: Normal   Collection Time   07/15/2012  8:34 AM      Component Value Range Status Comment   Technologist Review     Final    Value: Few Metas and Myelocytes present, occ plasma cells, rouleaux  MRSA PCR SCREENING     Status: Normal   Collection Time   08/03/2012  4:43 PM      Component Value Range Status Comment   MRSA by PCR NEGATIVE  NEGATIVE Final      Studies: Dg Chest Port 1 View  07/13/2012  *RADIOLOGY REPORT*  Clinical Data: CHF and anemia.  History of multiple myeloma.  PORTABLE CHEST - 1 VIEW  Comparison: 06/03/2012  Findings: There is evidence of moderate congestive heart failure and small bilateral pleural effusions.  Appearance of the Port-A- Cath is stable.  Stable heart size.  IMPRESSION: Moderate CHF with bilateral small pleural effusions.   Original Report Authenticated By: Reola Calkins, M.D.     Scheduled Meds:   . amitriptyline  20 mg Oral QHS  . dexamethasone  40 mg Oral Weekly  . famotidine  10 mg Oral Daily  . feeding supplement  1 Container Oral TID BM  . furosemide  40 mg Intravenous Q12H  . levothyroxine  75 mcg Oral Q breakfast  . pantoprazole  40 mg Oral Daily  . DISCONTD: furosemide  40 mg Intravenous Daily

## 2012-07-16 ENCOUNTER — Inpatient Hospital Stay (HOSPITAL_COMMUNITY): Payer: Medicare Other

## 2012-07-16 ENCOUNTER — Other Ambulatory Visit (HOSPITAL_COMMUNITY): Payer: Medicare Other

## 2012-07-16 DIAGNOSIS — R5383 Other fatigue: Secondary | ICD-10-CM

## 2012-07-16 LAB — TYPE AND SCREEN
Unit division: 0
Unit division: 0

## 2012-07-16 LAB — PROTEIN ELECTROPHORESIS, SERUM
Albumin ELP: 36.7 % — ABNORMAL LOW (ref 55.8–66.1)
Alpha-1-Globulin: 3.5 % (ref 2.9–4.9)
Alpha-2-Globulin: 7.1 % (ref 7.1–11.8)
Beta 2: 2.1 % — ABNORMAL LOW (ref 3.2–6.5)
Beta Globulin: 2.5 % — ABNORMAL LOW (ref 4.7–7.2)

## 2012-07-16 LAB — BASIC METABOLIC PANEL
CO2: 20 mEq/L (ref 19–32)
Chloride: 99 mEq/L (ref 96–112)
GFR calc non Af Amer: 32 mL/min — ABNORMAL LOW (ref >90)
Glucose, Bld: 104 mg/dL — ABNORMAL HIGH (ref 70–99)
Potassium: 3.4 mEq/L — ABNORMAL LOW (ref 3.5–5.1)
Sodium: 133 mEq/L — ABNORMAL LOW (ref 135–145)

## 2012-07-16 LAB — CBC
Hemoglobin: 8.1 g/dL — ABNORMAL LOW (ref 12.0–15.0)
MCH: 32.4 pg (ref 26.0–34.0)
MCV: 97.6 fL (ref 78.0–100.0)
RBC: 2.5 MIL/uL — ABNORMAL LOW (ref 3.87–5.11)

## 2012-07-16 LAB — PREPARE RBC (CROSSMATCH)

## 2012-07-16 MED ORDER — FUROSEMIDE 40 MG PO TABS
40.0000 mg | ORAL_TABLET | Freq: Two times a day (BID) | ORAL | Status: DC
  Administered 2012-07-16 – 2012-07-17 (×3): 40 mg via ORAL
  Filled 2012-07-16 (×6): qty 1

## 2012-07-16 MED ORDER — SODIUM CHLORIDE 0.9 % IJ SOLN
10.0000 mL | INTRAMUSCULAR | Status: DC | PRN
  Administered 2012-07-16: 10 mL

## 2012-07-16 MED ORDER — FUROSEMIDE 10 MG/ML IJ SOLN
40.0000 mg | Freq: Once | INTRAMUSCULAR | Status: AC
  Administered 2012-07-17: 40 mg via INTRAVENOUS
  Filled 2012-07-16: qty 4

## 2012-07-16 MED ORDER — POTASSIUM CHLORIDE CRYS ER 20 MEQ PO TBCR
40.0000 meq | EXTENDED_RELEASE_TABLET | Freq: Once | ORAL | Status: AC
  Administered 2012-07-16: 40 meq via ORAL
  Filled 2012-07-16: qty 2

## 2012-07-16 MED ORDER — ONDANSETRON HCL 4 MG PO TABS
4.0000 mg | ORAL_TABLET | Freq: Four times a day (QID) | ORAL | Status: DC | PRN
  Administered 2012-07-21: 4 mg via ORAL
  Filled 2012-07-16: qty 1

## 2012-07-16 MED ORDER — MEGESTROL ACETATE 40 MG/ML PO SUSP
200.0000 mg | Freq: Two times a day (BID) | ORAL | Status: DC
  Administered 2012-07-16 – 2012-07-20 (×9): 200 mg via ORAL
  Administered 2012-07-21: 400 mg via ORAL
  Administered 2012-07-21 – 2012-07-22 (×2): 200 mg via ORAL
  Filled 2012-07-16 (×15): qty 5

## 2012-07-16 NOTE — Progress Notes (Signed)
NM called and said that the pt could not tol the vq scan she is unable to be still and is short of breath while lying down text page sent to Dr Elisabeth Pigeon to inform her.

## 2012-07-16 NOTE — Progress Notes (Signed)
@   Subjective:  Denies CP or dyspnea   Objective:  Filed Vitals:   07/15/12 0640 07/15/12 0900 07/15/12 1407 07/15/12 2210  BP: 133/80  108/70 117/49  Pulse: 98  89 96  Temp: 97.7 F (36.5 C)  97.9 F (36.6 C) 98.1 F (36.7 C)  TempSrc: Oral  Oral Oral  Resp: 18  20 18   Height:      Weight: 157 lb 14.5 oz (71.625 kg) 156 lb 8 oz (70.988 kg)    SpO2: 94%  96% 96%    Intake/Output from previous day:  Intake/Output Summary (Last 24 hours) at 07/16/12 0542 Last data filed at 07/15/12 1407  Gross per 24 hour  Intake    240 ml  Output      0 ml  Net    240 ml    Physical Exam: Physical exam: Well-developed frail in no acute distress.  Skin is warm and dry.  HEENT is normal.  Neck is supple.  Chest with mildly diminished BS bases Cardiovascular exam is regular rate and rhythm.  Abdominal exam mildly tender; not distended. No masses palpated. Extremities show trace edema. neuro grossly intact    Lab Results: Basic Metabolic Panel:  Basename 07/14/12 0720  NA 135  K 3.7  CL 99  CO2 21  GLUCOSE 116*  BUN 42*  CREATININE 1.62*  CALCIUM 8.3*  MG --  PHOS --   CBC:  Basename 07/16/12 0500 07/14/12 0720  WBC 9.8 8.2  NEUTROABS -- --  HGB 8.1* 8.3*  HCT 24.4* 24.6*  MCV 97.6 94.6  PLT 33* 36*     Assessment/Plan:  1 Acute on chronic diastolic CHF - Echo shows normal LV function. CHF is most likely from combination of diastolic dysfunction and anemia. She feels better this AM; change lasix to 40 mg po BID; follow renal function. Note VQ scan is pending. 2 Multiple myeloma - progressing per oncology 3 Acute on chronic renal insufficiency - follow renal function 4 NCB 5 Anemia related to myeloma - transfuse PRN We will follow from a distance.  Olga Millers 07/16/2012, 5:42 AM

## 2012-07-16 NOTE — Consult Note (Signed)
WOC consult Note Reason for Consult:Sacral Ulcer Wound type:MASD (Moisture associated skin damage, specifically, ITD or, intertriginous dermatitis).  NOT a pressure ulcer. Pressure Ulcer POA: No Appearance:  Diffuse erythema between buttocks crease.  Patient's husband states that patient has had some loose stools in the recent past and that clean up has been "hit or miss". Dressing procedure/placement/frequency:Recommend use of our house skin barrier product applied frequently and generously to this intertriginous area. Staff report only one BM last shift and it was soft, not loose. No erythema beneath breasts and patient denies itching in vaginal area-I do not suspect the etiology to be fungal (yeast) at this time.  Both husband and granddaughter believe erythema is improving since yesterday.  Family insists on using a drain sponge over barrier cream.  This is not necessary, but it is not deleterious. I will provide a therapeutic air cushion for her chair when OOB. I will not follow.  Please re-consult if needed. Thanks, Ladona Mow, MSN, RN, [facility], CWOCN [phone])

## 2012-07-16 NOTE — Progress Notes (Signed)
Physical Therapy Treatment Patient Details Name: Karen Barron MRN: [id number] DOB: July 12, 1926 Today's Date: 07/16/2012 Time: 4098-1191 PT Time Calculation (min): 29 min  PT Assessment / Plan / Recommendation Comments on Treatment Session  Slowly increasing mobility. Pt continues to demonstrate general weakness and limited activity tolerance. HHPT    Follow Up Recommendations  Home health PT;Supervision/Assistance - 24 hour     Does the patient have the potential to tolerate intense rehabilitation     Barriers to Discharge        Equipment Recommendations  None recommended by PT    Recommendations for Other Services    Frequency Min 3X/week   Plan Discharge plan remains appropriate    Precautions / Restrictions Precautions Precautions: Fall Restrictions Weight Bearing Restrictions: No   Pertinent Vitals/Pain Pt denies    Mobility  Bed Mobility Bed Mobility: Not assessed Transfers Transfers: Sit to Stand;Stand to Sit Sit to Stand: 4: Min guard;From chair/3-in-1;With armrests Stand to Sit: 4: Min guard;To chair/3-in-1;With armrests Details for Transfer Assistance: VCs safety, technique, hand placement.  Ambulation/Gait Ambulation/Gait Assistance: 4: Min assist Ambulation Distance (Feet): 75 Feet Assistive device: Rolling walker Ambulation/Gait Assistance Details: VCs safety, distance from RW. Assist to stabilize and maneuver with RW. Fatigues easily.  Gait Pattern: Decreased stride length;Step-through pattern;Decreased step length - right;Decreased step length - left    Exercises     PT Diagnosis:    PT Problem List:   PT Treatment Interventions:     PT Goals Acute Rehab PT Goals Pt will go Sit to Stand: with supervision PT Goal: Sit to Stand - Progress: Progressing toward goal Pt will Ambulate: 51 - 150 feet;with supervision;with rolling walker PT Goal: Ambulate - Progress: Progressing toward goal  Visit Information  Last PT Received On:  07/16/12 Assistance Needed: +1    Subjective Data  Subjective: "I'm so tired" Patient Stated Goal: Get stronger   Cognition  Overall Cognitive Status: Appears within functional limits for tasks assessed/performed Arousal/Alertness: Awake/alert Orientation Level: Appears intact for tasks assessed Behavior During Session: [facility] for tasks performed    Balance     End of Session PT - End of Session Equipment Utilized During Treatment: Gait belt Activity Tolerance: Patient limited by fatigue Patient left: in chair;with call bell/phone within reach;with family/visitor present   GP     Rebeca Alert [facility] 07/16/2012, 3:42 PM [phone]

## 2012-07-16 NOTE — Progress Notes (Signed)
Patient has a newly entered order for wound consult for ? Sacral wound. Patient does not have any open areas in or around the sacral area. Buttocks are somewhat reddened, however skin is dry and intact. Barrier cream is being applies as needed. Please considered discontinuing consult to prevent unnecessary charge.

## 2012-07-16 NOTE — Progress Notes (Signed)
[facility] PROGRESS NOTE  [name] ZOX:[id number] DOB: 1926-03-29 DOA: 08/07/2012 PCP: Marga Melnick, MD  Brief narrative: 78 year old very pleasant female with multiple medical comorbidities including but not limited to multiple myeloma who has completed cycle 6 melphalan/prednisone 12/29/2011. The IgM level was increased on 01/22/2012 and she subsequently began Pomalidomide on 02/26/2012 with weekly dexamethasone. She began cycle 3 on 04/26/2012 and subsequent serum M spike and IgM level were slightly lower on 03/18/2012 and stable since 05/17/2012. The most recent IgM around 7700. In addition, her hospital course is complicated due to decompensated CHF for which she is taking lasix IV while we are closely monitoring her renal function which is improving.  Assessment and Plan:   Principal Problem:  * Symptomatic anemia  Patient has received 2 units PRBC's since admission and we will transfuse additional 2 units today Will follow up CBC in am  Active Problems:  Acute decompensated diastolic CHF  Based on 2 D ECHO in 02/2012 EF 60-65% and BNP elevated at 4,700  BNP on this admission 3,080 which is better than previous admission BNP (in 02/2012 of >4,000)  appreciate cardiology following We will continue daily weight and strict intake and output  F/U CXR 07/14/2012 - moderate CHF  Thrombocytopenia  Likely due to multiple myeloma and sequela of chemotherapy  Appears to be around patient's baseline Acute kidney injury  Likely secondary to diuretics  Creatinine improving Follow up BMP in am Hypothyroidism  Continue synthroid 75 mcg daily Multiple Myeloma  IgM increased and per oncology recommendations they will consider referal to [facility] for further MM management as patient has been on multiple treatments  Will continue current Q week (saturday) dexamethasone Peripheral neuropathy  Continue amitriptyline Sacral decubitus ulcer   appreciate wound care consult  Code Status:  DNR  Family Communication: family updated at bedside  Disposition Plan: home when stable and to resume home health PT/OT/RN   Manson Passey, MD  [facility]  Pager [phone]   Consultants:  Oncology  Cardiology  Other consults:  Physical therapy Procedure:  None Antibiotics:  None   If 7PM-7AM, please contact night-coverage [email or web address] Password [email or web address] 07/16/2012, 3:54 PM   LOS: 4 days   HPI/Subjective: Feels week today.  Objective: Filed Vitals:   07/15/12 0900 07/15/12 1407 07/15/12 2210 07/16/12 0600  BP:  108/70 117/49 110/44  Pulse:  89 96 97  Temp:  97.9 F (36.6 C) 98.1 F (36.7 C) 98 F (36.7 C)  TempSrc:  Oral Oral Oral  Resp:  20 18 18   Height:      Weight: 70.988 kg (156 lb 8 oz)   71.94 kg (158 lb 9.6 oz)  SpO2:  96% 96% 95%    Intake/Output Summary (Last 24 hours) at 07/16/12 1554 Last data filed at 07/16/12 0659  Gross per 24 hour  Intake      0 ml  Output    300 ml  Net   -300 ml    Exam:   General:  Pt is alert, follows commands appropriately, not in acute distress  Cardiovascular: Regular rate and rhythm, S1/S2, no murmurs, no rubs, no gallops  Respiratory: diminished breath sounds bilaterally, no wheezing, no crackles, no rhonchi  Abdomen: Soft, non tender, non distended, bowel sounds present, no guarding  Extremities: Pedal edema, pulses DP and PT palpable bilaterally  Neuro: Grossly nonfocal  Data Reviewed: Basic Metabolic Panel:  Lab 07/16/12 8295 07/14/12 0720 07/13/12 0430 07/10/2012 1829 07/22/2012 0834  NA 133* 135 133* 134*  133*  K 3.4* 3.7 3.7 4.2 5.0 Repeated and Verified  CL 99 99 98 100 102  CO2 20 21 20 20  14*  GLUCOSE 104* 116* 99 110* 173*  BUN 28* 42* 45* 47* 49.0*  CREATININE 1.45* 1.62* 1.57* 1.64* 1.9*  CALCIUM 8.3* 8.3* 8.4 8.3* 8.3*   Liver Function Tests:  Lab 07/13/12 0430  AST 13  ALT 29  ALKPHOS 47  BILITOT 2.3*  PROT 9.8*  ALBUMIN 2.6*   CBC:  Lab 07/16/12 0500 07/14/12 0720 07/13/12 0430 07/13/12  0050 07/19/2012 0834  WBC 9.8 8.2 6.3 5.6 6.2  HGB 8.1* 8.3* 9.0* 8.9* 6.8*  HCT 24.4* 24.6* 26.5* 26.0* 21.7*  MCV 97.6 94.6 93.6 93.9 99.5  PLT 33* 36* 37* 35* 39*   Studies: Dg Chest 2 View 07/16/2012  *  IMPRESSION: 1.  Mild-moderate congestive heart failure with small left pleural effusion. 2.  Bibasilar areas of atelectasis and/or consolidation. 3.  Additional findings, similar to prior examinations, as above.      Scheduled Meds:   . amitriptyline  20 mg Oral QHS  . famotidine  10 mg Oral Daily  . feeding supplement  1 Container Oral TID BM  . furosemide  40 mg Oral BID  . levothyroxine  75 mcg Oral Q breakfast  . megestrol  200 mg Oral BID  . pantoprazole  40 mg Oral Daily

## 2012-07-16 NOTE — Progress Notes (Addendum)
During administration of 40 mg IV Lasix, RN noticed cloudiness in IV tubing.  Administration was stopped, pt assessed, and pharmacy was notified of vial lot #.  New vial of Lasix was obtained and administered.   Lasix vial lot #: [id number]  Exp date: 06/09/2013

## 2012-07-16 NOTE — Progress Notes (Signed)
Karen Barron   DOB:04/21/1926   XL#:[id number]   [id number]#:[id number]  Subjective: She complains of feeling "weak". Her appetite is poor. The family requests an appetite stimulant for her.  Objective: Filed Vitals:   07/16/12 0600  BP: 110/44  Pulse: 97  Temp: 98 F (36.7 C)  Resp: 18    Body mass index is 27.22 kg/(Karen^2).  Intake/Output Summary (Last 24 hours) at 07/16/12 1400 Last data filed at 07/16/12 0659  Gross per 24 hour  Intake    240 ml  Output    300 ml  Net    -60 ml       Lungs clear -- decreased breath sounds at the bases, no respiratory distress  Heart regular rate and rhythm  Vascular-trace low pretibial and ankle edema bilaterally             Port-A-Cath without erythema   Labs:  Lab Results  Component Value Date   WBC 9.8 07/16/2012   HGB 8.1* 07/16/2012   HCT 24.4* 07/16/2012   MCV 97.6 07/16/2012   PLT 33* 07/16/2012   NEUTROABS 1.4* 08-08-2012    IgM 7780, serum Karen spike 4.37 on 08-08-2012 IgM 8140 on 07/15/2012  Basic Metabolic Panel:  Lab 07/16/12 5956 07/14/12 0720 07/13/12 0430 2012-08-08 1829 08/08/2012 0834  NA 133* 135 133* 134* 133*  K 3.4* 3.7 -- -- --  CL 99 99 98 100 102  CO2 20 21 20 20  14*  GLUCOSE 104* 116* 99 110* 173*  BUN 28* 42* 45* 47* 49.0*  CREATININE 1.45* 1.62* 1.57* 1.64* 1.9*  CALCIUM 8.3* 8.3* 8.4 8.3* 8.3*  MG -- -- -- -- --  PHOS -- -- -- -- --   GFR Estimated Creatinine Clearance: 28.1 ml/min (by C-G formula based on Cr of 1.45). Liver Function Tests:  Lab 07/13/12 0430  AST 13  ALT 29  ALKPHOS 47  BILITOT 2.3*  PROT 9.8*  ALBUMIN 2.6*   CBC:  Lab 07/16/12 0500 07/14/12 0720 07/13/12 0430 07/13/12 0050 August 08, 2012 0834  WBC 9.8 8.2 6.3 5.6 6.2  NEUTROABS -- -- -- -- 1.4*  HGB 8.1* 8.3* 9.0* 8.9* 6.8*  HCT 24.4* 24.6* 26.5* 26.0* 21.7*  MCV 97.6 94.6 93.6 93.9 99.5  PLT 33* 36* 37* 35* 39*     Studies:  Dg Chest 2 View  07/16/2012  *RADIOLOGY REPORT*  Clinical Data: Shortness of breath.  Possible congestive  heart failure.  CHEST - 2 VIEW  Comparison: Chest x-ray 07/13/2012.  Findings:  There is cephalization of the pulmonary vasculature and slight indistinctness of the interstitial markings suggestive of mild pulmonary edema.  Bibasilar opacities (left greater than right) may reflect superimposed areas of atelectasis and/or consolidation, with superimposed small left pleural effusion.  The heart size is borderline enlarged. The patient is rotated to the right on today's exam, resulting in distortion of the mediastinal contours and reduced diagnostic sensitivity and specificity for mediastinal pathology. Atherosclerosis in the thoracic aorta.  Post procedural changes of vertebroplasty are noted at multiple levels throughout the mid and lower thoracic spine.  Orthopedic fixation hardware is noted in the lower lumbar spine (incompletely visualized).  Well-defined sclerotic lesion in the right humeral head is unchanged compared to numerous remote prior examinations, and favored to represent a bone island.  Status post left shoulder hemiarthroplasty.  IMPRESSION: 1.  Mild-moderate congestive heart failure with small left pleural effusion. 2.  Bibasilar areas of atelectasis and/or consolidation. 3.  Additional findings, similar to prior examinations, as above.  Original Report Authenticated By: [name], [name]     Assessment: 78 y.o. [place] woman admitted with anemia, thrombocytopenia, diastolic dysfunction 1. Multiple myeloma treated with subcutaneous Velcade and Cytoxan/Decadron on a weekly schedule beginning in May 2011. She completed 1 year of treatment on 01/24/2011. The serum Karen-spike and IgM level were increased beginning in May 2012. She began treatment with carfilzomib on 05/25/2011. She completed 2 cycles. The serum Karen-spike and IgM level were higher on 07/12/2011. She began cycle 1 melphalan/prednisone 07/25/2011. The IgM level was improved on 08/21/2011. She began cycle 2 melphalan/prednisone on  08/29/2011. The melphalan dose was reduced to 8 mg daily for 4 days beginning with cycle 2. The melphalan was dose reduced to 6 mg daily for 4 days beginning with the third cycle of melphalan/prednisone on 09/27/2011. She completed a fourth cycle of melphalan/prednisone beginning on 10/25/2011. The IgM level was stable on 11/15/2011. She completed cycle 6 melphalan/prednisone beginning 12/29/2011. The IgM level was increased on 01/22/2012. She began Pomalidomide on 02/26/2012 with weekly dexamethasone. She began cycle 3 on 04/26/2012. The serum Karen spike and IgM level were slightly lower on 03/18/2012. The IgM level was improved on 04/12/2012, stable 05/17/2012. She completed cycle 4 Pomalidomide beginning 05/24/2012. The IgM level was stable and the serum Karen spike was improved on 06/14/2012. She began cycle 5 of pomalidomide on 06/21/2012. The IgM level and serum Karen spike were higher on 08/02/2012 . 2. Hospitalization with shortness of breath/acute diastolic heart failure 02/26/2012 through 03/11/2012. 3. Hospitalization with dyspnea/hypoxia secondary to volume overload in the setting of diastolic heart failure on 05/26/2011. 4. Hospitalization 06/02/2011 with dyspnea. She was found to have marked anemia with a hemoglobin of 6.7. She received a red cell transfusion 09/27/2011. 5. History of congestive heart failure status post hospital admission 05/26/2011. 6. Thrombocytopenia secondary to multiple myeloma and pomalidomide. Stable. 7. T7 compression fracture status post kyphoplasty 11/23/2007 by Dr. Noel Gerold. 8. Peripheral neuropathy status post evaluation by Dr. Anne Hahn. The neuropathy is most likely related to Velcade therapy. 9. Herpes zoster at the left abdomen and chest wall status post Valtrex therapy. 10. Postherpetic neuralgia. 11. History of neutropenia secondary to Velcade and Cytoxan. There is persistent neutropenia. 12. Pain and swelling of the proximal interphalangeal joint of the right 3rd finger  when she was here on 02/21/2011, improved. She was treated with a Medrol Dosepak. 13. Cough, wheezing, dyspnea, and pleuritic right-sided chest pain and here on 01/22/2012. Chest CT showed right upper lobe pneumonia. She completed a 10 day course of Avelox on 01/31/2012. 14. Persistent severe anemia. She last transfused on 07/21/2012 15. Left wrist pain/tenderness, erythema and edema-02/02/2012,? Gout versus a joint infection. Improved after Keflex and a Medrol Dosepak. 16. Fever/chills 06/03/2012 with no localizing source for infection. Urinalysis was unremarkable. A blood culture was negative. Chest x-ray was negative. She completed a course of Levaquin. She had no further fever.  She continues to have generalized weakness. She was admitted with increased shortness of breath. I suspect the dyspnea is related to decompensated diastolic heart in the setting of severe anemia. She is scheduled for a ventilation/perfusion scan today.  The myeloma has progressed on pomalidomide/Decadron. We will discontinue this therapy. Ms. Lemert has been treated with multiple systemic regimens including previous treatment with Velcade, Cytoxan, Revlimid, and carfilzomib. Systemic treatment options with "standard" agents are limited. We will consider making a referral to the myeloma service at [facility]. Ms. Simoni is scheduled for a followup visit with me on 07/18/2012  Recommendations:  1. Begin trial of Megace as an appetite stimulant 2. Consider transfusing 2 additional units of packed red blood cells for treatment of the symptomatic anemia 3. Discharge when stable from a respiratory standpoint 4. Outpatient followup at the cancer Center as scheduled      [facility], [name] 07/16/2012

## 2012-07-16 NOTE — Progress Notes (Signed)
Pt BP 10/7 at 2210 117/49 pulse 96.  This am 10/8 0640 BP 105/42 pulse 98.  Pt is asymptomatic and resting in chair. Will continue to monitor.

## 2012-07-17 DIAGNOSIS — R609 Edema, unspecified: Secondary | ICD-10-CM

## 2012-07-17 DIAGNOSIS — R0609 Other forms of dyspnea: Secondary | ICD-10-CM

## 2012-07-17 LAB — BASIC METABOLIC PANEL
GFR calc Af Amer: 39 mL/min — ABNORMAL LOW (ref >90)
GFR calc non Af Amer: 34 mL/min — ABNORMAL LOW (ref >90)
Potassium: 3.7 mEq/L (ref 3.5–5.1)
Sodium: 134 mEq/L — ABNORMAL LOW (ref 135–145)

## 2012-07-17 LAB — CBC
Hemoglobin: 9.8 g/dL — ABNORMAL LOW (ref 12.0–15.0)
MCHC: 34.1 g/dL (ref 30.0–36.0)
RDW: 19.3 % — ABNORMAL HIGH (ref 11.5–15.5)

## 2012-07-17 MED ORDER — DIPHENHYDRAMINE HCL 50 MG/ML IJ SOLN
12.5000 mg | Freq: Once | INTRAMUSCULAR | Status: AC
  Administered 2012-07-17: 12.5 mg via INTRAVENOUS
  Filled 2012-07-17: qty 1

## 2012-07-17 MED ORDER — FUROSEMIDE 10 MG/ML IJ SOLN
20.0000 mg | Freq: Two times a day (BID) | INTRAMUSCULAR | Status: DC
  Administered 2012-07-17 – 2012-07-19 (×5): 20 mg via INTRAVENOUS
  Filled 2012-07-17 (×7): qty 2

## 2012-07-17 NOTE — Progress Notes (Signed)
PT Cancellation Note  Patient Details Name: Karen Barron MRN: [id number] DOB: 26-Oct-1925   Cancelled Treatment:     Attempted PT tx session. Pt declined to participate at this time due to fatigue. Requested PT check back another time. Will check back on tomorrow. Thanks.    Rebeca Alert [facility] 07/17/2012, 3:23 PM [phone]

## 2012-07-17 NOTE — Progress Notes (Signed)
[place]   DOB:1926-03-30   ZO#:[id number]   JWJ#:[id number]  Subjective: She complains of feeling "weak". She completed a red cell transfusion this morning. She continues to have shortness of breath. She is ambulating to the bathroom. Objective: Filed Vitals:   07/17/12 1414  BP: 109/42  Pulse: 97  Temp: 98.8 F (37.1 C)  Resp: 20    Body mass index is 27.22 kg/(m^2).  Intake/Output Summary (Last 24 hours) at 07/17/12 1447 Last data filed at 07/17/12 0500  Gross per 24 hour  Intake    699 ml  Output      0 ml  Net    699 ml       Lungs clear -- decreased breath sounds at the bases, no respiratory distress  Heart regular rate and rhythm  Vascular-trace low pretibial and ankle edema bilaterally             Port-A-Cath without erythema   Labs:  Lab Results  Component Value Date   WBC 12.3* 07/17/2012   HGB 9.8* 07/17/2012   HCT 28.7* 07/17/2012   MCV 94.1 07/17/2012   PLT 32* 07/17/2012   NEUTROABS 1.4* 07/17/2012    IgM 7780, serum M spike 4.37 on 07/18/2012 IgM 8140 on 07/15/2012  Basic Metabolic Panel:  Lab 07/17/12 6213 07/16/12 0500 07/14/12 0720 07/13/12 0430 08/06/2012 1829  NA 134* 133* 135 133* 134*  K 3.7 3.4* -- -- --  CL 101 99 99 98 100  CO2 20 20 21 20 20   GLUCOSE 123* 104* 116* 99 110*  BUN 24* 28* 42* 45* 47*  CREATININE 1.39* 1.45* 1.62* 1.57* 1.64*  CALCIUM 8.5 8.3* 8.3* 8.4 8.3*  MG -- -- -- -- --  PHOS -- -- -- -- --   GFR Estimated Creatinine Clearance: 29.3 ml/min (by C-G formula based on Cr of 1.39). Liver Function Tests:  Lab 07/13/12 0430  AST 13  ALT 29  ALKPHOS 47  BILITOT 2.3*  PROT 9.8*  ALBUMIN 2.6*   CBC:  Lab 07/17/12 0740 07/16/12 0500 07/14/12 0720 07/13/12 0430 07/13/12 0050  WBC 12.3* 9.8 8.2 6.3 5.6  NEUTROABS -- -- -- -- --  HGB 9.8* 8.1* 8.3* 9.0* 8.9*  HCT 28.7* 24.4* 24.6* 26.5* 26.0*  MCV 94.1 97.6 94.6 93.6 93.9  PLT 32* 33* 36* 37* 35*     Studies:  Dg Chest 2 View  07/16/2012  *RADIOLOGY REPORT*   Clinical Data: Shortness of breath.  Possible congestive heart failure.  CHEST - 2 VIEW  Comparison: Chest x-ray 07/13/2012.  Findings:  There is cephalization of the pulmonary vasculature and slight indistinctness of the interstitial markings suggestive of mild pulmonary edema.  Bibasilar opacities (left greater than right) may reflect superimposed areas of atelectasis and/or consolidation, with superimposed small left pleural effusion.  The heart size is borderline enlarged. The patient is rotated to the right on today's exam, resulting in distortion of the mediastinal contours and reduced diagnostic sensitivity and specificity for mediastinal pathology. Atherosclerosis in the thoracic aorta.  Post procedural changes of vertebroplasty are noted at multiple levels throughout the mid and lower thoracic spine.  Orthopedic fixation hardware is noted in the lower lumbar spine (incompletely visualized).  Well-defined sclerotic lesion in the right humeral head is unchanged compared to numerous remote prior examinations, and favored to represent a bone island.  Status post left shoulder hemiarthroplasty.  IMPRESSION: 1.  Mild-moderate congestive heart failure with small left pleural effusion. 2.  Bibasilar areas of atelectasis and/or consolidation.  3.  Additional findings, similar to prior examinations, as above.   Original Report Authenticated By: [name], M.D.     Assessment: 78 y.o. [place] woman admitted with anemia, thrombocytopenia, diastolic dysfunction 1. Multiple myeloma treated with subcutaneous Velcade and Cytoxan/Decadron on a weekly schedule beginning in May 2011. She completed 1 year of treatment on 01/24/2011. The serum M-spike and IgM level were increased beginning in May 2012. She began treatment with carfilzomib on 05/25/2011. She completed 2 cycles. The serum M-spike and IgM level were higher on 07/12/2011. She began cycle 1 melphalan/prednisone 07/25/2011. The IgM level was improved  on 08/21/2011. She began cycle 2 melphalan/prednisone on 08/29/2011. The melphalan dose was reduced to 8 mg daily for 4 days beginning with cycle 2. The melphalan was dose reduced to 6 mg daily for 4 days beginning with the third cycle of melphalan/prednisone on 09/27/2011. She completed a fourth cycle of melphalan/prednisone beginning on 10/25/2011. The IgM level was stable on 11/15/2011. She completed cycle 6 melphalan/prednisone beginning 12/29/2011. The IgM level was increased on 01/22/2012. She began Pomalidomide on 02/26/2012 with weekly dexamethasone. She began cycle 3 on 04/26/2012. The serum M spike and IgM level were slightly lower on 03/18/2012. The IgM level was improved on 04/12/2012, stable 05/17/2012. She completed cycle 4 Pomalidomide beginning 05/24/2012. The IgM level was stable and the serum M spike was improved on 06/14/2012. She began cycle 5 of pomalidomide on 06/21/2012. The IgM level and serum M spike were higher on 07/28/2012 . 2. Hospitalization with shortness of breath/acute diastolic heart failure 02/26/2012 through 03/11/2012. 3. Hospitalization with dyspnea/hypoxia secondary to volume overload in the setting of diastolic heart failure on 05/26/2011. 4. Hospitalization 06/02/2011 with dyspnea. She was found to have marked anemia with a hemoglobin of 6.7. She received a red cell transfusion 09/27/2011. 5. History of congestive heart failure status post hospital admission 05/26/2011. 6. Thrombocytopenia secondary to multiple myeloma and pomalidomide. Stable. 7. T7 compression fracture status post kyphoplasty 11/23/2007 by Dr. Noel Gerold. 8. Peripheral neuropathy status post evaluation by Dr. Anne Hahn. The neuropathy is most likely related to Velcade therapy. 9. Herpes zoster at the left abdomen and chest wall status post Valtrex therapy. 10. Postherpetic neuralgia. 11. History of neutropenia secondary to Velcade and Cytoxan. There is persistent neutropenia. 12. Pain and swelling of the  proximal interphalangeal joint of the right 3rd finger when she was here on 02/21/2011, improved. She was treated with a Medrol Dosepak. 13. Cough, wheezing, dyspnea, and pleuritic right-sided chest pain and here on 01/22/2012. Chest CT showed right upper lobe pneumonia. She completed a 10 day course of Avelox on 01/31/2012. 14. Persistent severe anemia. She last transfused on 07/16/2012 15. Left wrist pain/tenderness, erythema and edema-02/02/2012,? Gout versus a joint infection. Improved after Keflex and a Medrol Dosepak. 16. Fever/chills 06/03/2012 with no localizing source for infection. Urinalysis was unremarkable. A blood culture was negative. Chest x-ray was negative. She completed a course of Levaquin. She had no further fever.  The generalized weakness and exertional dyspnea are partially improved after the red cell transfusions and diuresis. She continues to have significant dyspnea and there is lower extremity edema on exam today.  The myeloma has progressed on pomalidomide/Decadron. We will discontinue this therapy. Ms. Pena has been treated with multiple systemic regimens including previous treatment with Velcade, Cytoxan, Revlimid, and carfilzomib. I discussed treatment options with Ms. Cowman and her family this morning. We discussed supportive care, an attempt at salvage therapy with Velcade, and a referral to the [facility]  myeloma service. She would like to continue treatment. She completed Velcade therapy in April of 2012 after one year of Velcade. The serum M spike Rose within 1-2 months of completing Velcade and she has significant neuropathy with Velcade therapy. The chance of a clinical response with Velcade is small. I will discuss this option with Ms. Dewilde and her husband again on 07/18/2012. We will consider a trial of Velcade/Decadron beginning on 07/18/2012  Recommendations: 1. consider a trial of Velcade/Decadron to start on 07/18/2012 2. Diuresis per the medical service I will  cancel the scheduled followup visit for 07/18/2012. We will arrange for outpatient followup at the cancer Center.   Jerita Wimbush 07/17/2012

## 2012-07-17 NOTE — Progress Notes (Signed)
[facility] PROGRESS NOTE  [name] ZOX:[id number] DOB: Jan 13, 1926 DOA: 08-11-12 PCP: Marga Melnick, MD  Brief narrative: 78 year old very pleasant female with multiple medical comorbidities including but not limited to multiple myeloma who has completed cycle 6 melphalan/prednisone 12/29/2011. The IgM level was increased on 01/22/2012 and she subsequently began Pomalidomide on 02/26/2012 with weekly dexamethasone. She began cycle 3 on 04/26/2012 and subsequent serum M spike and IgM level were slightly lower on 03/18/2012 and stable since 05/17/2012. The most recent IgM around 7700. In addition, her hospital course is complicated due to decompensated CHF for which she is taking lasix IV while we are closely monitoring her renal function which is improving.   Today patient is very dyspenic.She did get two units of blood yesterday.Patient and her husband say that she is unable to go down for V/Q scan today.  Assessment and Plan:   Principal Problem:  * Symptomatic anemia  Patient has received 4 units PRBC's since admission  Will follow up CBC in am  Active Problems:  Acute decompensated diastolic CHF  Based on 2 D ECHO in 02/2012 EF 60-65% and BNP elevated at 4,700  BNP on this admission 3,080 which is better than previous admission BNP (in 02/2012 of >4,000)  appreciate cardiology following We will continue daily weight and strict intake and output  F/U CXR 07/14/2012 - moderate CHF  Will change the lasix to iv 20 mg q 12 hr. Thrombocytopenia  Likely due to multiple myeloma and sequela of chemotherapy  Appears to be around patient's baseline Acute kidney injury  Likely secondary to diuretics  Creatinine improving Follow up BMP in am Hypothyroidism  Continue synthroid 75 mcg daily Multiple Myeloma  IgM increased and per oncology recommendations they will consider referal to [facility] for further MM management as patient has been on multiple treatments  Will continue current  Q week (saturday) dexamethasone Peripheral neuropathy  Continue amitriptyline Sacral decubitus ulcer   appreciate wound care consult  Code Status: DNR  Family Communication: family updated at bedside  Disposition Plan: home when stable and to resume home health PT/OT/RN   Manson Passey, MD  [facility]  Pager [phone]   Consultants:  Oncology  Cardiology  Other consults:  Physical therapy Procedure:  None Antibiotics:  None   If 7PM-7AM, please contact night-coverage [email or web address] Password [email or web address] 07/17/2012, 2:48 PM   LOS: 5 days   HPI/Subjective: Feels week today.  Objective: Filed Vitals:   07/17/12 0500 07/17/12 0600 07/17/12 0630 07/17/12 1414  BP: 115/48 114/66 114/44 109/42  Pulse: 99 96 96 97  Temp: 98.7 F (37.1 C) 98.3 F (36.8 C) 98.8 F (37.1 C) 98.8 F (37.1 C)  TempSrc: Oral Oral Oral Oral  Resp: 20 20 20 20   Height:      Weight:      SpO2:   97% 97%    Intake/Output Summary (Last 24 hours) at 07/17/12 1448 Last data filed at 07/17/12 0500  Gross per 24 hour  Intake    699 ml  Output      0 ml  Net    699 ml    Exam:   General:  Pt is alert, follows commands appropriately, not in acute distress  Cardiovascular: Regular rate and rhythm, S1/S2, no murmurs, no rubs, no gallops  Respiratory: diminished breath sounds bilaterally, no wheezing, no crackles, no rhonchi  Abdomen: Soft, non tender, non distended, bowel sounds present, no guarding  Extremities: Pedal edema, pulses DP and PT palpable bilaterally  Neuro: Grossly nonfocal  Data Reviewed: Basic Metabolic Panel:  Lab 07/16/12 6578 07/14/12 0720 07/13/12 0430 August 06, 2012 1829 08/06/2012 0834  NA 133* 135 133* 134* 133*  K 3.4* 3.7 3.7 4.2 5.0 Repeated and Verified  CL 99 99 98 100 102  CO2 20 21 20 20  14*  GLUCOSE 104* 116* 99 110* 173*  BUN 28* 42* 45* 47* 49.0*  CREATININE 1.45* 1.62* 1.57* 1.64* 1.9*  CALCIUM 8.3* 8.3* 8.4 8.3* 8.3*   Liver Function Tests:  Lab 07/13/12 0430    AST 13  ALT 29  ALKPHOS 47  BILITOT 2.3*  PROT 9.8*  ALBUMIN 2.6*   CBC:  Lab 07/16/12 0500 07/14/12 0720 07/13/12 0430 07/13/12 0050 06-Aug-2012 0834  WBC 9.8 8.2 6.3 5.6 6.2  HGB 8.1* 8.3* 9.0* 8.9* 6.8*  HCT 24.4* 24.6* 26.5* 26.0* 21.7*  MCV 97.6 94.6 93.6 93.9 99.5  PLT 33* 36* 37* 35* 39*   Studies: Dg Chest 2 View 07/16/2012  *  IMPRESSION: 1.  Mild-moderate congestive heart failure with small left pleural effusion. 2.  Bibasilar areas of atelectasis and/or consolidation. 3.  Additional findings, similar to prior examinations, as above.      Scheduled Meds:   . amitriptyline  20 mg Oral QHS  . famotidine  10 mg Oral Daily  . feeding supplement  1 Container Oral TID BM  . furosemide  40 mg Oral BID  . levothyroxine  75 mcg Oral Q breakfast  . megestrol  200 mg Oral BID  . pantoprazole  40 mg Oral Daily

## 2012-07-18 ENCOUNTER — Ambulatory Visit: Payer: Medicare Other | Admitting: Oncology

## 2012-07-18 ENCOUNTER — Encounter (HOSPITAL_COMMUNITY): Payer: Self-pay

## 2012-07-18 ENCOUNTER — Other Ambulatory Visit: Payer: Medicare Other | Admitting: Lab

## 2012-07-18 DIAGNOSIS — K219 Gastro-esophageal reflux disease without esophagitis: Secondary | ICD-10-CM

## 2012-07-18 DIAGNOSIS — G609 Hereditary and idiopathic neuropathy, unspecified: Secondary | ICD-10-CM

## 2012-07-18 DIAGNOSIS — E039 Hypothyroidism, unspecified: Secondary | ICD-10-CM

## 2012-07-18 LAB — HEPATIC FUNCTION PANEL
Albumin: 2.4 g/dL — ABNORMAL LOW (ref 3.5–5.2)
Total Protein: 10.2 g/dL — ABNORMAL HIGH (ref 6.0–8.3)

## 2012-07-18 LAB — CBC
MCHC: 33.7 g/dL (ref 30.0–36.0)
Platelets: 31 10*3/uL — ABNORMAL LOW (ref 150–400)
RDW: 19.4 % — ABNORMAL HIGH (ref 11.5–15.5)

## 2012-07-18 LAB — DIFFERENTIAL
Basophils Absolute: 0 10*3/uL (ref 0.0–0.1)
Basophils Relative: 0 % (ref 0–1)
Eosinophils Absolute: 0.7 10*3/uL (ref 0.0–0.7)
Eosinophils Relative: 4 % (ref 0–5)
Lymphocytes Relative: 71 % — ABNORMAL HIGH (ref 12–46)
Lymphs Abs: 12 10*3/uL — ABNORMAL HIGH (ref 0.7–4.0)
Neutro Abs: 2.5 10*3/uL (ref 1.7–7.7)
Neutrophils Relative %: 15 % — ABNORMAL LOW (ref 43–77)
nRBC: 0 /100 WBC

## 2012-07-18 LAB — TYPE AND SCREEN
ABO/RH(D): A POS
Unit division: 0

## 2012-07-18 LAB — BASIC METABOLIC PANEL
BUN: 24 mg/dL — ABNORMAL HIGH (ref 6–23)
Calcium: 8.2 mg/dL — ABNORMAL LOW (ref 8.4–10.5)
Creatinine, Ser: 1.4 mg/dL — ABNORMAL HIGH (ref 0.50–1.10)
GFR calc Af Amer: 39 mL/min — ABNORMAL LOW (ref >90)
GFR calc non Af Amer: 33 mL/min — ABNORMAL LOW (ref >90)
Potassium: 3.2 mEq/L — ABNORMAL LOW (ref 3.5–5.1)

## 2012-07-18 LAB — PROTEIN ELECTROPHORESIS, SERUM
Albumin ELP: 33.4 % — ABNORMAL LOW (ref 55.8–66.1)
Alpha-1-Globulin: 2.9 % (ref 2.9–4.9)
Alpha-2-Globulin: 6.4 % — ABNORMAL LOW (ref 7.1–11.8)
Total Protein ELP: 9.7 g/dL — ABNORMAL HIGH (ref 6.0–8.3)

## 2012-07-18 MED ORDER — DEXAMETHASONE 4 MG PO TABS
40.0000 mg | ORAL_TABLET | Freq: Once | ORAL | Status: DC
  Filled 2012-07-18: qty 10

## 2012-07-18 MED ORDER — BORTEZOMIB CHEMO SQ INJECTION 3.5 MG (2.5MG/ML): 1.3000 mg/m2 | Freq: Once | INTRAMUSCULAR | Status: DC

## 2012-07-18 MED ORDER — FUROSEMIDE 10 MG/ML IJ SOLN
40.0000 mg | Freq: Once | INTRAMUSCULAR | Status: AC
  Administered 2012-07-18: 40 mg via INTRAVENOUS
  Filled 2012-07-18: qty 4

## 2012-07-18 MED ORDER — ONDANSETRON HCL 8 MG PO TABS
8.0000 mg | ORAL_TABLET | Freq: Once | ORAL | Status: DC
  Filled 2012-07-18: qty 1

## 2012-07-18 MED ORDER — BISACODYL 10 MG RE SUPP
10.0000 mg | Freq: Every day | RECTAL | Status: DC | PRN
  Administered 2012-07-18: 10 mg via RECTAL
  Filled 2012-07-18 (×2): qty 1

## 2012-07-18 NOTE — Progress Notes (Signed)
Karen Barron   DOB:[birth year]   GL#:[id number]   [id number]  Subjective: She continues to feel weak. The dyspnea has improved compared to hospital admission. Objective: Filed Vitals:   07/18/12 1338  BP: 103/66  Pulse: 97  Temp: 98.8 F (37.1 C)  Resp: 20    Body mass index is 27.22 kg/(m^2).  Intake/Output Summary (Last 24 hours) at 07/18/12 1349 Last data filed at 07/18/12 0949  Gross per 24 hour  Intake    604 ml  Output    350 ml  Net    254 ml       Lungs clear -- decreased breath sounds at the bases, no respiratory distress  Heart regular rate and rhythm  Vascular-trace low pretibial and ankle edema bilaterally             Port-A-Cath without erythema   Labs:  Lab Results  Component Value Date   WBC 16.9* 07/18/2012   HGB 9.4* 07/18/2012   HCT 27.9* 07/18/2012   MCV 94.6 07/18/2012   PLT 31* 07/18/2012   NEUTROABS 1.4* 07-18-12    IgM 7780, serum M spike 4.37 on Jul 18, 2012 IgM 8140 on 07/15/2012  Basic Metabolic Panel:  Lab 07/18/12 5188 07/17/12 0740 07/16/12 0500 07/14/12 0720 07/13/12 0430  NA 133* 134* 133* 135 133*  K 3.2* 3.7 -- -- --  CL 100 101 99 99 98  CO2 19 20 20 21 20   GLUCOSE 138* 123* 104* 116* 99  BUN 24* 24* 28* 42* 45*  CREATININE 1.40* 1.39* 1.45* 1.62* 1.57*  CALCIUM 8.2* 8.5 8.3* 8.3* 8.4  MG -- -- -- -- --  PHOS -- -- -- -- --   GFR Estimated Creatinine Clearance: 29.1 ml/min (by C-G formula based on Cr of 1.4). Liver Function Tests:  Lab 07/13/12 0430  AST 13  ALT 29  ALKPHOS 47  BILITOT 2.3*  PROT 9.8*  ALBUMIN 2.6*   CBC:  Lab 07/18/12 0400 07/17/12 0740 07/16/12 0500 07/14/12 0720 07/13/12 0430  WBC 16.9* 12.3* 9.8 8.2 6.3  NEUTROABS -- -- -- -- --  HGB 9.4* 9.8* 8.1* 8.3* 9.0*  HCT 27.9* 28.7* 24.4* 24.6* 26.5*  MCV 94.6 94.1 97.6 94.6 93.6  PLT 31* 32* 33* 36* 37*     Studies:  No results found.  Assessment: 78 y.o. [place] woman admitted with anemia, thrombocytopenia, diastolic  dysfunction 1. Multiple myeloma treated with subcutaneous Velcade and Cytoxan/Decadron on a weekly schedule beginning in May 2011. She completed 1 year of treatment on 01/24/2011. The serum M-spike and IgM level were increased beginning in May 2012. She began treatment with carfilzomib on 05/25/2011. She completed 2 cycles. The serum M-spike and IgM level were higher on 07/12/2011. She began cycle 1 melphalan/prednisone 07/25/2011. The IgM level was improved on 08/21/2011. She began cycle 2 melphalan/prednisone on 08/29/2011. The melphalan dose was reduced to 8 mg daily for 4 days beginning with cycle 2. The melphalan was dose reduced to 6 mg daily for 4 days beginning with the third cycle of melphalan/prednisone on 09/27/2011. She completed a fourth cycle of melphalan/prednisone beginning on 10/25/2011. The IgM level was stable on 11/15/2011. She completed cycle 6 melphalan/prednisone beginning 12/29/2011. The IgM level was increased on 01/22/2012. She began Pomalidomide on 02/26/2012 with weekly dexamethasone. She began cycle 3 on 04/26/2012. The serum M spike and IgM level were slightly lower on 03/18/2012. The IgM level was improved on 04/12/2012, stable 05/17/2012. She completed cycle 4 Pomalidomide beginning 05/24/2012. The IgM level was stable  and the serum M spike was improved on 06/14/2012. She began cycle 5 of pomalidomide on 06/21/2012. The IgM level and serum M spike were higher on 07/09/2012 . 2. Hospitalization with shortness of breath/acute diastolic heart failure 02/26/2012 through 03/11/2012. 3. Hospitalization with dyspnea/hypoxia secondary to volume overload in the setting of diastolic heart failure on 05/26/2011. 4. Hospitalization 06/02/2011 with dyspnea. She was found to have marked anemia with a hemoglobin of 6.7. She received a red cell transfusion 09/27/2011. 5. History of congestive heart failure status post hospital admission 05/26/2011. 6. Thrombocytopenia secondary to multiple  myeloma and pomalidomide. Stable. 7. T7 compression fracture status post kyphoplasty 11/23/2007 by Dr. Noel Gerold. 8. Peripheral neuropathy status post evaluation by Dr. Anne Hahn. The neuropathy is most likely related to Velcade therapy. 9. Herpes zoster at the left abdomen and chest wall status post Valtrex therapy. 10. Postherpetic neuralgia. 11. History of neutropenia secondary to Velcade and Cytoxan. There is persistent neutropenia. 12. Pain and swelling of the proximal interphalangeal joint of the right 3rd finger when she was here on 02/21/2011, improved. She was treated with a Medrol Dosepak. 13. Cough, wheezing, dyspnea, and pleuritic right-sided chest pain and here on 01/22/2012. Chest CT showed right upper lobe pneumonia. She completed a 10 day course of Avelox on 01/31/2012. 14. Persistent severe anemia. She last transfused on 07/16/2012 15. Left wrist pain/tenderness, erythema and edema-02/02/2012,? Gout versus a joint infection. Improved after Keflex and a Medrol Dosepak. 16. Fever/chills 06/03/2012 with no localizing source for infection. Urinalysis was unremarkable. A blood culture was negative. Chest x-ray was negative. She completed a course of Levaquin. She had no further fever. 17. Leukocytosis-? Plasmacytosis. Check a white cell differential today.  The generalized weakness and exertional dyspnea are partially improved after the red cell transfusions and diuresis. She continues to have significant dyspnea, weakness andlower extremity edema remains on exam today.  I discussed treatment options at length with Karen Barron and Karen Barron again today. She would like to continue treatment and [place]. She was last treated with Velcade in approximately April of 2012. The serum M spike was higher shortly after discontinuing Velcade. Karen Barron developed significant neuropathy symptoms while on Velcade. She would like to begin another trial of Velcade/Decadron. She understands the likelihood of  clinical improvement is small and she may develop worsened neuropathy.  Recommendations: 1. Velcade/Decadron weekly to start on 07/18/2012 2. Diuresis per the medical service 3. Check a white cell differential count 4. I will schedule outpatient followup at the cancer Center.   Reginold Beale 07/18/2012

## 2012-07-18 NOTE — Progress Notes (Signed)
Patient given handouts on Velcade. Patient and husband expressed understanding. RN will continue to monitor throughout shift.

## 2012-07-18 NOTE — Progress Notes (Signed)
Patient ID: Karen Barron  female  ZOX:[id number]    DOB: May 05, 1926    DOA: 07/17/2012  PCP: Marga Melnick, MD  Brief narrative:  78 year old very pleasant female with multiple medical comorbidities including but not limited to multiple myeloma who has completed cycle 6 melphalan/prednisone 12/29/2011. The IgM level was increased on 01/22/2012 and she subsequently began Pomalidomide on 02/26/2012 with weekly dexamethasone. She began cycle 3 on 04/26/2012 and subsequent serum M spike and IgM level were slightly lower on 03/18/2012 and stable since 05/17/2012. The most recent IgM around 7700. In addition, her hospital course is complicated due to decompensated CHF for which she is taking lasix IV while we are closely monitoring her renal function which is improving   Subjective: Still very short of breath, sitting up in chair. Husband at the bedside   Objective: Weight change:   Intake/Output Summary (Last 24 hours) at 07/18/12 1325 Last data filed at 07/18/12 0949  Gross per 24 hour  Intake    604 ml  Output    350 ml  Net    254 ml   Blood pressure 120/70, pulse 105, temperature 98.5 F (36.9 C), temperature source Oral, resp. rate 20, height 5\' 4"  (1.626 m), weight 71.94 kg (158 lb 9.6 oz), SpO2 97.00%.  Physical Exam: General: Alert and awake, oriented x3, not in any acute distress. HEENT: anicteric sclera, pupils reactive to light and accommodation, EOMI CVS: S1-S2 clear, no murmur rubs or gallops Chest: Diminished breath sounds bilaterally, no wheezing, rales or rhonchi Abdomen: soft nontender, nondistended, normal bowel sounds, no organomegaly Extremities: no cyanosis, clubbing. + edema noted bilaterally Neuro: Cranial nerves II-XII intact, no focal neurological deficits  Lab Results: Basic Metabolic Panel:  Lab 07/18/12 8119 07/17/12 0740  NA 133* 134*  K 3.2* 3.7  CL 100 101  CO2 19 20  GLUCOSE 138* 123*  BUN 24* 24*  CREATININE 1.40* 1.39*  CALCIUM 8.2* 8.5  MG  -- --  PHOS -- --   Liver Function Tests:  Lab 07/13/12 0430  AST 13  ALT 29  ALKPHOS 47  BILITOT 2.3*  PROT 9.8*  ALBUMIN 2.6*   CBC:  Lab 07/18/12 0400 07/17/12 0740  WBC 16.9* 12.3*  NEUTROABS -- --  HGB 9.4* 9.8*  HCT 27.9* 28.7*  MCV 94.6 94.1  PLT 31* 32*     Micro Results: Recent Results (from the past 240 hour(s))  TECHNOLOGIST REVIEW     Status: Normal   Collection Time   07/13/2012  8:34 AM      Component Value Range Status Comment   Technologist Review     Final    Value: Few Metas and Myelocytes present, occ plasma cells, rouleaux  MRSA PCR SCREENING     Status: Normal   Collection Time   07/21/2012  4:43 PM      Component Value Range Status Comment   MRSA by PCR NEGATIVE  NEGATIVE Final     Studies/Results: Dg Chest 2 View  07/16/2012  *RADIOLOGY REPORT*  Clinical Data: Shortness of breath.  Possible congestive heart failure.  CHEST - 2 VIEW  Comparison: Chest x-ray 07/13/2012.  Findings:  There is cephalization of the pulmonary vasculature and slight indistinctness of the interstitial markings suggestive of mild pulmonary edema.  Bibasilar opacities (left greater than right) may reflect superimposed areas of atelectasis and/or consolidation, with superimposed small left pleural effusion.  The heart size is borderline enlarged. The patient is rotated to the right on today's  exam, resulting in distortion of the mediastinal contours and reduced diagnostic sensitivity and specificity for mediastinal pathology. Atherosclerosis in the thoracic aorta.  Post procedural changes of vertebroplasty are noted at multiple levels throughout the mid and lower thoracic spine.  Orthopedic fixation hardware is noted in the lower lumbar spine (incompletely visualized).  Well-defined sclerotic lesion in the right humeral head is unchanged compared to numerous remote prior examinations, and favored to represent a bone island.  Status post left shoulder hemiarthroplasty.  IMPRESSION: 1.   Mild-moderate congestive heart failure with small left pleural effusion. 2.  Bibasilar areas of atelectasis and/or consolidation. 3.  Additional findings, similar to prior examinations, as above.   Original Report Authenticated By: [name], M.D.    Dg Chest Port 1 View  07/13/2012  *RADIOLOGY REPORT*  Clinical Data: CHF and anemia.  History of multiple myeloma.  PORTABLE CHEST - 1 VIEW  Comparison: 06/03/2012  Findings: There is evidence of moderate congestive heart failure and small bilateral pleural effusions.  Appearance of the Port-A- Cath is stable.  Stable heart size.  IMPRESSION: Moderate CHF with bilateral small pleural effusions.   Original Report Authenticated By: Reola Calkins, M.D.     Medications: Scheduled Meds:   . amitriptyline  20 mg Oral QHS  . famotidine  10 mg Oral Daily  . feeding supplement  1 Container Oral TID BM  . furosemide  20 mg Intravenous Q12H  . levothyroxine  75 mcg Oral Q breakfast  . megestrol  200 mg Oral BID  . pantoprazole  40 mg Oral Daily   Continuous Infusions:    Assessment/Plan:  Principal Problem:  Acute decompensated diastolic CHF: Still significant shortness of breath, unable to obtain VQ scan as patient is not able to lie flat per ECHO in 02/2012 EF 60-65% and BNP elevated at 4,700, cardiology following   Currently on Lasix 20mg  q12hours. Will give one extra dose of 40 mg Lasix today.   Symptomatic anemia  Patient has received 4 units PRBC's since admission, H/H at baseline   Thrombocytopenia  Likely due to multiple myeloma and sequela of chemotherapy  Appears to be around patient's baseline, oncology following  Acute kidney injury: Likely secondary to diuretics  Monitor BMET   Hypothyroidism  Continue synthroid 75 mcg daily  Multiple Myeloma  IgM increased and per oncology recommendations they will consider referal to [facility] for further MM management as patient has been on multiple treatments. Also planning trial of  velcade and decadron today.    Peripheral neuropathy  Continue amitriptyline  Sacral decubitus ulcer  appreciate wound care consult   DVT Prophylaxis: SCD's  Code Status: DNR  Disposition: not ready. Updated patient and her husband at bed side.   LOS: 6 days   Darcie Mellone M.D. [facility] 07/18/2012, 1:25 PM Pager: [phone]  If 7PM-7AM, please contact night-coverage [email or web address] Password [email or web address]

## 2012-07-19 ENCOUNTER — Other Ambulatory Visit (HOSPITAL_COMMUNITY): Payer: Medicare Other

## 2012-07-19 DIAGNOSIS — D72829 Elevated white blood cell count, unspecified: Secondary | ICD-10-CM

## 2012-07-19 DIAGNOSIS — D709 Neutropenia, unspecified: Secondary | ICD-10-CM

## 2012-07-19 MED ORDER — FUROSEMIDE 10 MG/ML IJ SOLN
40.0000 mg | Freq: Three times a day (TID) | INTRAMUSCULAR | Status: AC
  Administered 2012-07-19 – 2012-07-20 (×4): 40 mg via INTRAVENOUS
  Filled 2012-07-19 (×4): qty 4

## 2012-07-19 MED ORDER — ALBUTEROL SULFATE HFA 108 (90 BASE) MCG/ACT IN AERS
2.0000 | INHALATION_SPRAY | Freq: Three times a day (TID) | RESPIRATORY_TRACT | Status: DC
  Administered 2012-07-19 – 2012-07-21 (×5): 2 via RESPIRATORY_TRACT

## 2012-07-19 MED ORDER — ONDANSETRON HCL 8 MG PO TABS
8.0000 mg | ORAL_TABLET | Freq: Once | ORAL | Status: AC
  Administered 2012-07-19: 8 mg via ORAL
  Filled 2012-07-19 (×2): qty 1

## 2012-07-19 MED ORDER — DEXAMETHASONE 4 MG PO TABS
40.0000 mg | ORAL_TABLET | Freq: Once | ORAL | Status: AC
  Administered 2012-07-19: 40 mg via ORAL
  Filled 2012-07-19 (×2): qty 10

## 2012-07-19 MED ORDER — MORPHINE SULFATE 2 MG/ML IJ SOLN
1.0000 mg | INTRAMUSCULAR | Status: DC | PRN
  Administered 2012-07-19 – 2012-07-22 (×8): 2 mg via INTRAVENOUS
  Filled 2012-07-19 (×8): qty 1

## 2012-07-19 MED ORDER — ACYCLOVIR 200 MG PO CAPS
200.0000 mg | ORAL_CAPSULE | Freq: Two times a day (BID) | ORAL | Status: DC
  Administered 2012-07-19 – 2012-07-21 (×6): 200 mg via ORAL
  Filled 2012-07-19 (×10): qty 1

## 2012-07-19 MED ORDER — BORTEZOMIB CHEMO SQ INJECTION 3.5 MG (2.5MG/ML)
1.3000 mg/m2 | Freq: Once | INTRAMUSCULAR | Status: AC
  Administered 2012-07-19: 2.25 mg via SUBCUTANEOUS
  Filled 2012-07-19: qty 0.9

## 2012-07-19 NOTE — Progress Notes (Signed)
[place] B Zick   DOB:[birth year]   ZH#:[id number]   [id number]  Subjective: She continues to feel weak.  Objective: Filed Vitals:   07/19/12 0650  BP: 92/62  Pulse: 93  Temp: 98.2 F (36.8 C)  Resp: 20    Body mass index is 27.22 kg/(m^2).  Intake/Output Summary (Last 24 hours) at 07/19/12 1000 Last data filed at 07/19/12 0600  Gross per 24 hour  Intake    484 ml  Output    525 ml  Net    -41 ml       Lungs clear -- decreased breath sounds at the bases, no respiratory distress  Heart regular rate and rhythm  Vascular-pitting edema at the lower leg bilaterally             Port-A-Cath without erythema   Labs:  Lab Results  Component Value Date   WBC 16.9* 07/18/2012   HGB 9.4* 07/18/2012   HCT 27.9* 07/18/2012   MCV 94.6 07/18/2012   PLT 31* 07/18/2012   NEUTROABS 2.5 07/18/2012   absolute lymphocyte count 12.0  IgM 7780, serum M spike 4.37 on 08/06/2012 IgM 8140 on 07/15/2012  Basic Metabolic Panel:  Lab 07/18/12 6295 07/17/12 0740 07/16/12 0500 07/14/12 0720 07/13/12 0430  NA 133* 134* 133* 135 133*  K 3.2* 3.7 -- -- --  CL 100 101 99 99 98  CO2 19 20 20 21 20   GLUCOSE 138* 123* 104* 116* 99  BUN 24* 24* 28* 42* 45*  CREATININE 1.40* 1.39* 1.45* 1.62* 1.57*  CALCIUM 8.2* 8.5 8.3* 8.3* 8.4  MG -- -- -- -- --  PHOS -- -- -- -- --   GFR Estimated Creatinine Clearance: 29.1 ml/min (by C-G formula based on Cr of 1.4). Liver Function Tests:  Lab 07/18/12 0400 07/13/12 0430  AST 23 13  ALT 40* 29  ALKPHOS 47 47  BILITOT 1.3* 2.3*  PROT 10.2* 9.8*  ALBUMIN 2.4* 2.6*    Studies:  No results found.  Assessment: 78 y.o. [place] woman admitted with anemia, thrombocytopenia, diastolic dysfunction 1. Multiple myeloma treated with subcutaneous Velcade and Cytoxan/Decadron on a weekly schedule beginning in May 2011. She completed 1 year of treatment on 01/24/2011. The serum M-spike and IgM level were increased beginning in May 2012. She began treatment  with carfilzomib on 05/25/2011. She completed 2 cycles. The serum M-spike and IgM level were higher on 07/12/2011. She began cycle 1 melphalan/prednisone 07/25/2011. The IgM level was improved on 08/21/2011. She began cycle 2 melphalan/prednisone on 08/29/2011. The melphalan dose was reduced to 8 mg daily for 4 days beginning with cycle 2. The melphalan was dose reduced to 6 mg daily for 4 days beginning with the third cycle of melphalan/prednisone on 09/27/2011. She completed a fourth cycle of melphalan/prednisone beginning on 10/25/2011. The IgM level was stable on 11/15/2011. She completed cycle 6 melphalan/prednisone beginning 12/29/2011. The IgM level was increased on 01/22/2012. She began Pomalidomide on 02/26/2012 with weekly dexamethasone. She began cycle 3 on 04/26/2012. The serum M spike and IgM level were slightly lower on 03/18/2012. The IgM level was improved on 04/12/2012, stable 05/17/2012. She completed cycle 4 Pomalidomide beginning 05/24/2012. The IgM level was stable and the serum M spike was improved on 06/14/2012. She began cycle 5 of pomalidomide on 06/21/2012. The IgM level and serum M spike were higher on 07/11/2012 . 2. Hospitalization with shortness of breath/acute diastolic heart failure 02/26/2012 through 03/11/2012. 3. Hospitalization with dyspnea/hypoxia secondary to volume overload in the setting of  diastolic heart failure on 05/26/2011. 4. Hospitalization 06/02/2011 with dyspnea. She was found to have marked anemia with a hemoglobin of 6.7. She received a red cell transfusion 09/27/2011. 5. History of congestive heart failure status post hospital admission 05/26/2011. 6. Thrombocytopenia secondary to multiple myeloma and pomalidomide. Stable. 7. T7 compression fracture status post kyphoplasty 11/23/2007 by Dr. Noel Gerold. 8. Peripheral neuropathy status post evaluation by Dr. Anne Hahn. The neuropathy is most likely related to Velcade therapy. 9. Herpes zoster at the left abdomen and  chest wall status post Valtrex therapy. 10. Postherpetic neuralgia. 11. History of neutropenia secondary to Velcade and Cytoxan. There is persistent neutropenia. 12. Pain and swelling of the proximal interphalangeal joint of the right 3rd finger when she was here on 02/21/2011, improved. She was treated with a Medrol Dosepak. 13. Cough, wheezing, dyspnea, and pleuritic right-sided chest pain and here on 01/22/2012. Chest CT showed right upper lobe pneumonia. She completed a 10 day course of Avelox on 01/31/2012. 14. Persistent severe anemia. She last transfused on 07/16/2012 15. Left wrist pain/tenderness, erythema and edema-02/02/2012,? Gout versus a joint infection. Improved after Keflex and a Medrol Dosepak. 16. Fever/chills 06/03/2012 with no localizing source for infection. Urinalysis was unremarkable. A blood culture was negative. Chest x-ray was negative. She completed a course of Levaquin. She had no further fever. 17. Leukocytosis-the lymphocyte count is elevated, I suspect she has developed circulating myeloma   She continues to appear weak. I suspect this is due to to progression of the myeloma. Ms. Iden continues to appear volume overloaded. Velcade was held yesterday secondary to an elevated bilirubin level from earlier in the week. The bilirubin was improved yesterday. She will receive Velcade/Decadron today. Ms. Klutts and her husband agree with continuing treatment.  Recommendations: 1. Velcade/Decadron weekly to start on 07/19/2012 2. continue diuresis per the medical service 3. please call oncology as needed over the weekend. I will see her 07/22/2012 if she remains in the hospital. 4. I will review the peripheral blood smear today. 5. I will schedule outpatient followup at the cancer Center.   Allix Blomquist, [name] 07/19/2012

## 2012-07-19 NOTE — Progress Notes (Signed)
Order to transfer pt to room 1344, report given to [place]

## 2012-07-19 NOTE — Progress Notes (Signed)
Patient ID: Karen Barron  female  GNF:[id number]    DOB: [birth year]    DOA: 07/20/2012  PCP: Marga Melnick, MD  Brief narrative:  78 year old very pleasant female with multiple medical comorbidities including but not limited to multiple myeloma who has completed cycle 6 melphalan/prednisone 12/29/2011. The IgM level was increased on 01/22/2012 and she subsequently began Pomalidomide on 02/26/2012 with weekly dexamethasone. She began cycle 3 on 04/26/2012 and subsequent serum M spike and IgM level were slightly lower on 03/18/2012 and stable since 05/17/2012. The most recent IgM around 7700. In addition, her hospital course is complicated due to decompensated CHF for which she is taking lasix IV while we are closely monitoring her renal function which is improving 07/19/12: Velcade/Decadron weekly to start today per Dr Truett Perna.  Subjective: Still very short of breath, sitting up in chair, 3 + edema to thighs. Husband at the bedside extremely concerned about edema.   Objective: Weight change:   Intake/Output Summary (Last 24 hours) at 07/19/12 1446 Last data filed at 07/19/12 0600  Gross per 24 hour  Intake    484 ml  Output    525 ml  Net    -41 ml   Blood pressure 117/42, pulse 96, temperature 98.8 F (37.1 C), temperature source Oral, resp. rate 20, height 5\' 4"  (1.626 m), weight 71.94 kg (158 lb 9.6 oz), SpO2 99.00%.  Physical Exam: General: Alert and awake, oriented x3, not in any acute distress. HEENT: anicteric sclera, pupils reactive to light and accommodation, EOMI CVS: S1-S2 clear, no murmur rubs or gallops Chest: Diminished breath sounds bilaterally, no wheezing, rales or rhonchi Abdomen: soft nontender, nondistended, normal bowel sounds, no organomegaly Extremities: no cyanosis, clubbing. 3+ edema noted bilaterally to thighs Neuro: Cranial nerves II-XII intact, no focal neurological deficits  Lab Results: Basic Metabolic Panel:  Lab 07/18/12 8469 07/17/12 0740  NA  133* 134*  K 3.2* 3.7  CL 100 101  CO2 19 20  GLUCOSE 138* 123*  BUN 24* 24*  CREATININE 1.40* 1.39*  CALCIUM 8.2* 8.5  MG -- --  PHOS -- --   Liver Function Tests:  Lab 07/18/12 0400 07/13/12 0430  AST 23 13  ALT 40* 29  ALKPHOS 47 47  BILITOT 1.3* 2.3*  PROT 10.2* 9.8*  ALBUMIN 2.4* 2.6*   CBC:  Lab 07/18/12 0400 07/17/12 0740  WBC 16.9* 12.3*  NEUTROABS 2.5 --  HGB 9.4* 9.8*  HCT 27.9* 28.7*  MCV 94.6 94.1  PLT 31* 32*     Micro Results: Recent Results (from the past 240 hour(s))  TECHNOLOGIST REVIEW     Status: Normal   Collection Time   08/02/2012  8:34 AM      Component Value Range Status Comment   Technologist Review     Final    Value: Few Metas and Myelocytes present, occ plasma cells, rouleaux  MRSA PCR SCREENING     Status: Normal   Collection Time   08/05/2012  4:43 PM      Component Value Range Status Comment   MRSA by PCR NEGATIVE  NEGATIVE Final     Studies/Results: Dg Chest 2 View  07/16/2012  *RADIOLOGY REPORT*  Clinical Data: Shortness of breath.  Possible congestive heart failure.  CHEST - 2 VIEW  Comparison: Chest x-ray 07/13/2012.  Findings:  There is cephalization of the pulmonary vasculature and slight indistinctness of the interstitial markings suggestive of mild pulmonary edema.  Bibasilar opacities (left greater than right) may reflect superimposed  areas of atelectasis and/or consolidation, with superimposed small left pleural effusion.  The heart size is borderline enlarged. The patient is rotated to the right on today's exam, resulting in distortion of the mediastinal contours and reduced diagnostic sensitivity and specificity for mediastinal pathology. Atherosclerosis in the thoracic aorta.  Post procedural changes of vertebroplasty are noted at multiple levels throughout the mid and lower thoracic spine.  Orthopedic fixation hardware is noted in the lower lumbar spine (incompletely visualized).  Well-defined sclerotic lesion in the right  humeral head is unchanged compared to numerous remote prior examinations, and favored to represent a bone island.  Status post left shoulder hemiarthroplasty.  IMPRESSION: 1.  Mild-moderate congestive heart failure with small left pleural effusion. 2.  Bibasilar areas of atelectasis and/or consolidation. 3.  Additional findings, similar to prior examinations, as above.   Original Report Authenticated By: [name], M.D.    Dg Chest Port 1 View  07/13/2012  *RADIOLOGY REPORT*  Clinical Data: CHF and anemia.  History of multiple myeloma.  PORTABLE CHEST - 1 VIEW  Comparison: 06/03/2012  Findings: There is evidence of moderate congestive heart failure and small bilateral pleural effusions.  Appearance of the Port-A- Cath is stable.  Stable heart size.  IMPRESSION: Moderate CHF with bilateral small pleural effusions.   Original Report Authenticated By: Reola Calkins, M.D.     Medications: Scheduled Meds:    . acyclovir  200 mg Oral BID  . albuterol  2 puff Inhalation TID  . amitriptyline  20 mg Oral QHS  . bortezomib SQ  1.3 mg/m2 (Treatment Plan Actual) Subcutaneous Once  . dexamethasone  40 mg Oral Once  . famotidine  10 mg Oral Daily  . feeding supplement  1 Container Oral TID BM  . furosemide  40 mg Intravenous Once  . furosemide  40 mg Intravenous Q8H  . levothyroxine  75 mcg Oral Q breakfast  . megestrol  200 mg Oral BID  . ondansetron  8 mg Oral Once  . pantoprazole  40 mg Oral Daily  . DISCONTD: bortezomib SQ  1.3 mg/m2 (Treatment Plan Actual) Subcutaneous Once  . DISCONTD: dexamethasone  40 mg Oral Once  . DISCONTD: furosemide  20 mg Intravenous Q12H  . DISCONTD: ondansetron  8 mg Oral Once   Continuous Infusions:    Assessment/Plan:  Principal Problem:  Acute decompensated diastolic CHF: Still significant shortness of breath and peripheral edema. Albumin 2.4  per ECHO in 02/2012 EF 60-65% and BNP elevated, cardiology hasn't followed recently unable to obtain VQ  scan as patient is not able to lie flat   Currently on Lasix 20mg  q12hours, with one extra dose of 40 mg Lasix yesterday. I will place on 40mg  IV lasix q8hours, reassess in AM. Check BMET in AM. May need lasix drip or renal assistance if no significant improvement in am.   Symptomatic anemia  Patient has received 4 units PRBC's since admission, H/H at baseline   Thrombocytopenia  Likely due to multiple myeloma and sequela of chemotherapy  Appears to be around patient's baseline, oncology following  Acute kidney injury: Likely secondary to diuretics  Monitor BMET   Hypothyroidism  Continue synthroid 75 mcg daily  Multiple Myeloma  IgM increased and per oncology recommendations they will consider referal to [facility] for further MM management as patient has been on multiple treatments. Start velcade and decadron today.    Peripheral neuropathy  Continue amitriptyline  Sacral decubitus ulcer  appreciate wound care consult   DVT Prophylaxis:  SCD's  Code Status: DNR  Disposition: not ready. Updated patient and her husband at bed side.   LOS: 7 days   Dalilah Curlin M.D. [facility] 07/19/2012, 2:46 PM Pager: [phone]  If 7PM-7AM, please contact night-coverage [email or web address] Password [email or web address]

## 2012-07-20 LAB — CBC
HCT: 26.6 % — ABNORMAL LOW (ref 36.0–46.0)
Hemoglobin: 9 g/dL — ABNORMAL LOW (ref 12.0–15.0)
MCV: 94.7 fL (ref 78.0–100.0)
RDW: 19 % — ABNORMAL HIGH (ref 11.5–15.5)
WBC: 29.9 10*3/uL — ABNORMAL HIGH (ref 4.0–10.5)

## 2012-07-20 LAB — BASIC METABOLIC PANEL
BUN: 25 mg/dL — ABNORMAL HIGH (ref 6–23)
Chloride: 99 mEq/L (ref 96–112)
Creatinine, Ser: 1.66 mg/dL — ABNORMAL HIGH (ref 0.50–1.10)
GFR calc Af Amer: 32 mL/min — ABNORMAL LOW (ref >90)
Glucose, Bld: 186 mg/dL — ABNORMAL HIGH (ref 70–99)

## 2012-07-20 MED ORDER — METOLAZONE 5 MG PO TABS
5.0000 mg | ORAL_TABLET | Freq: Every day | ORAL | Status: DC
  Administered 2012-07-20 – 2012-07-22 (×3): 5 mg via ORAL
  Filled 2012-07-20 (×4): qty 1

## 2012-07-20 MED ORDER — DEXTROSE 5 % IV SOLN
120.0000 mg | Freq: Three times a day (TID) | INTRAVENOUS | Status: DC
  Administered 2012-07-20 – 2012-07-23 (×7): 120 mg via INTRAVENOUS
  Filled 2012-07-20 (×10): qty 12

## 2012-07-20 NOTE — Progress Notes (Signed)
Patient ID: Karen Barron  female  ZOX:[id number]    DOB: Apr 30, 1926    DOA: 07/15/2012  PCP: Marga Melnick, MD  Brief narrative:  78 year old very pleasant female with multiple medical comorbidities including but not limited to multiple myeloma who has completed cycle 6 melphalan/prednisone 12/29/2011. The IgM level was increased on 01/22/2012 and she subsequently began Pomalidomide on 02/26/2012 with weekly dexamethasone. She began cycle 3 on 04/26/2012 and subsequent serum M spike and IgM level were slightly lower on 03/18/2012 and stable since 05/17/2012. The most recent IgM around 7700. In addition, her hospital course is complicated due to decompensated CHF for which she is taking lasix IV while we are closely monitoring her renal function which is improving 07/19/12: Velcade/Decadron weekly started per Dr Truett Perna.  Subjective: Shortness of breath still there but somewhat improved, sitting up in chair, 3 + edema to thighs. Daughter at the bedside feeding breakfast.  Objective: Weight change:   Intake/Output Summary (Last 24 hours) at 07/20/12 0955 Last data filed at 07/20/12 0640  Gross per 24 hour  Intake    240 ml  Output   1900 ml  Net  -1660 ml   Blood pressure 104/50, pulse 100, temperature 98.4 F (36.9 C), temperature source Oral, resp. rate 18, height 5\' 4"  (1.626 m), weight 71.94 kg (158 lb 9.6 oz), SpO2 100.00%.  Physical Exam: General: Alert and awake, oriented x3, not in any acute distress. HEENT: anicteric sclera, pupils reactive to light and accommodation, EOMI CVS: S1-S2 clear, no murmur rubs or gallops Chest: Diminished breath sounds bilaterally, no wheezing, rales or rhonchi Abdomen: soft nontender, nondistended, normal bowel sounds, no organomegaly Extremities: no cyanosis, clubbing. 3+ edema noted bilaterally to thighs   Lab Results: Basic Metabolic Panel:  Lab 07/20/12 8119 07/18/12 0400  NA 132* 133*  K 3.6 3.2*  CL 99 100  CO2 17* 19  GLUCOSE 186*  138*  BUN 25* 24*  CREATININE 1.66* 1.40*  CALCIUM 8.2* 8.2*  MG -- --  PHOS -- --   Liver Function Tests:  Lab 07/18/12 0400  AST 23  ALT 40*  ALKPHOS 47  BILITOT 1.3*  PROT 10.2*  ALBUMIN 2.4*   CBC:  Lab 07/20/12 0600 07/18/12 0400  WBC 29.9* 16.9*  NEUTROABS -- 2.5  HGB 9.0* 9.4*  HCT 26.6* 27.9*  MCV 94.7 94.6  PLT 31* 31*     Micro Results: Recent Results (from the past 240 hour(s))  TECHNOLOGIST REVIEW     Status: Normal   Collection Time   07/15/2012  8:34 AM      Component Value Range Status Comment   Technologist Review     Final    Value: Few Metas and Myelocytes present, occ plasma cells, rouleaux  MRSA PCR SCREENING     Status: Normal   Collection Time   07/20/2012  4:43 PM      Component Value Range Status Comment   MRSA by PCR NEGATIVE  NEGATIVE Final     Studies/Results: Dg Chest 2 View  07/16/2012  *RADIOLOGY REPORT*  Clinical Data: Shortness of breath.  Possible congestive heart failure.  CHEST - 2 VIEW  Comparison: Chest x-ray 07/13/2012.  Findings:  There is cephalization of the pulmonary vasculature and slight indistinctness of the interstitial markings suggestive of mild pulmonary edema.  Bibasilar opacities (left greater than right) may reflect superimposed areas of atelectasis and/or consolidation, with superimposed small left pleural effusion.  The heart size is borderline enlarged. The patient is  rotated to the right on today's exam, resulting in distortion of the mediastinal contours and reduced diagnostic sensitivity and specificity for mediastinal pathology. Atherosclerosis in the thoracic aorta.  Post procedural changes of vertebroplasty are noted at multiple levels throughout the mid and lower thoracic spine.  Orthopedic fixation hardware is noted in the lower lumbar spine (incompletely visualized).  Well-defined sclerotic lesion in the right humeral head is unchanged compared to numerous remote prior examinations, and favored to represent a bone  island.  Status post left shoulder hemiarthroplasty.  IMPRESSION: 1.  Mild-moderate congestive heart failure with small left pleural effusion. 2.  Bibasilar areas of atelectasis and/or consolidation. 3.  Additional findings, similar to prior examinations, as above.   Original Report Authenticated By: [name], M.D.    Dg Chest Port 1 View  07/13/2012  *RADIOLOGY REPORT*  Clinical Data: CHF and anemia.  History of multiple myeloma.  PORTABLE CHEST - 1 VIEW  Comparison: 06/03/2012  Findings: There is evidence of moderate congestive heart failure and small bilateral pleural effusions.  Appearance of the Port-A- Cath is stable.  Stable heart size.  IMPRESSION: Moderate CHF with bilateral small pleural effusions.   Original Report Authenticated By: Reola Calkins, M.D.     Medications: Scheduled Meds:    . acyclovir  200 mg Oral BID  . albuterol  2 puff Inhalation TID  . amitriptyline  20 mg Oral QHS  . bortezomib SQ  1.3 mg/m2 (Treatment Plan Actual) Subcutaneous Once  . dexamethasone  40 mg Oral Once  . famotidine  10 mg Oral Daily  . feeding supplement  1 Container Oral TID BM  . furosemide  40 mg Intravenous Q8H  . levothyroxine  75 mcg Oral Q breakfast  . megestrol  200 mg Oral BID  . ondansetron  8 mg Oral Once  . pantoprazole  40 mg Oral Daily  . DISCONTD: furosemide  20 mg Intravenous Q12H   Continuous Infusions:    Assessment/Plan:  Principal Problem:        Acute decompensated diastolic CHF: Still significant shortness of breath and peripheral edema. Albumin 2.4  per ECHO in 02/2012 EF 60-65% and BNP elevated, cardiology hasn't followed recently unable to obtain VQ scan as patient is not able to lie flat   So far tolerating 40mg  IV lasix q 8hours started yesterday, Cr slightly up today. Discussed with Dr Arlean Hopping for renal assistance with diuresis.  Symptomatic anemia  Patient has received 4 units PRBC's since admission, H/H at baseline              Thrombocytopenia  Likely due to multiple myeloma and sequela of chemotherapy  Appears to be around patient's baseline, oncology following          Acute kidney injury: Likely secondary to diuretics, Cr Slightly worse today  Monitor BMET,  See #1         Hypothyroidism  Continue synthroid 75 mcg daily        Multiple Myeloma  IgM increased and per oncology recommendations they will consider referal to [facility] for further MM management as patient has been on multiple treatments. On trial of velcade and decadron weekly, first dose started on 07/19/12.         Peripheral neuropathy  Continue amitriptyline        Sacral decubitus ulcer  appreciate wound care consult   DVT Prophylaxis: SCD's  Code Status: DNR  Disposition: not ready. Updated patient and her daughter at bed side. Per my  discussion with them today, patient is not interested in skilled nursing facility. They will appreciate all the resources at home including home health PT, OT, home health aide, RN when she is ready for discharge.   LOS: 8 days   Cabela Pacifico M.D. [facility] 07/20/2012, 9:55 AM Pager: [phone]  If 7PM-7AM, please contact night-coverage [email or web address] Password [email or web address]

## 2012-07-20 NOTE — Consult Note (Signed)
[place] [facility] 07/20/2012 Rianna Lukes D Requesting Physician:  Dr. Isidoro Donning  Reason for Consult:  CKD with fluid overloa HPI: The patient is a 78 y.o. year-old with long hx of refractory multiple myeloma.  Has developed CKD over the past 2 years with creat up from baseline 0.8 in Jan 2012, to 1.2-1.9 this year. Patient was admitted on 10/4 with SOB, orthopnea, poor appetite. She has been getting chemo Rx. CXR on 10/4 showed sig pulm edema, on 10/8 showed improvement but still pulm edema. UOP has been only 500-800 cc /day until yest when lasix was increased and UOP was 1900 cc over 24 hours. Patient sitting up in chair, still can't lie flat, dyspneic. Weight no change from 10/4 > 10/8. No fever, no abd pain. Foley taken out yesterday.   ROS  no nsaids  no f/c/s  no n/v/d  no skin rash   no focal weakness,   no confusion,  no HA or blurred vision  Past Medical History:  Past Medical History  Diagnosis Date  . Personal history of colonic polyps   . Osteoporosis   . Waldenstrom's macroglobulinemia   . Diverticular disease   . Thyroid disease   . Fatty liver   . Hypertension   . Diabetes in pregnancy   . Hyperlipidemia   . Spinal stenosis   . Asthma   . Anemia   . DJD (degenerative joint disease)   . Esophageal reflux   . Hiatal hernia   . Chronic diastolic CHF (congestive heart failure)     Echo on 05/30/11 revealed nl LV systolic function, EF 65-70%, no WMAs, grade 2 diastolic dysfunction, mildly dilated RA/RV, peak PA pressure  . Multiple myeloma in remission   . CHF (congestive heart failure) 5/20-03/11/12    Past Surgical History:  Past Surgical History  Procedure Date  . Total abdominal hysterectomy     due to fibroids  . Appendectomy   . Kyphosis surgery   . Cataract extraction   . Colonoscopy w/ polypectomy   . Total knee arthroplasty     bilateral  . Lumbar fusion   . Portacath placement 04/15/2009    Family History:  Family History  Problem Relation  Age of Onset  . Leukemia Father   . Osteoporosis Mother   . Cancer Sister     CNS  . Melanoma Sister    Social History:  reports that she has never smoked. She has never used smokeless tobacco. She reports that she does not drink alcohol or use illicit drugs.  Allergies:  Allergies  Allergen Reactions  . Cladribine Other (See Comments)  . Codeine Other (See Comments)    don't remember   . Guaifenesin & Derivatives Other (See Comments)    Elevates BP  . Indomethacin Other (See Comments)    Does not remember reaction   . Naproxen Sodium Other (See Comments)    Does not remember reaction   . Risedronate Sodium     Home medications: Prior to Admission medications   Medication Sig Start Date End Date Taking? Authorizing Provider  AMBULATORY NON FORMULARY MEDICATION Lab Order (FASTING): Lipid/Hep/TSH  DX: 272.4/995.20/244.9/244.9 05/29/12  Yes Pecola Lawless, MD  amitriptyline (ELAVIL) 10 MG tablet Take 2 tablets (20 mg total) by mouth at bedtime. 06/06/12  Yes Pecola Lawless, MD  dexamethasone (DECADRON) 4 MG tablet Take 40 mg by mouth once a week. On Saturday. 06/11/12  Yes Ladene Artist, MD  diphenoxylate-atropine (LOMOTIL) 2.5-0.025 MG per tablet Take  1 tablet by mouth 4 (four) times daily as needed.   Yes Historical Provider, MD  esomeprazole (NEXIUM) 20 MG capsule Take 1 capsule (20 mg total) by mouth daily before breakfast. 05/14/12  Yes Pecola Lawless, MD  furosemide (LASIX) 40 MG tablet TAKE 1 TABLET BY MOUTH 2 TIMES A DAY 07/11/12  Yes Gaylord Shih, MD  HYDROmorphone (DILAUDID) 4 MG tablet Take 4 mg by mouth every 6 (six) hours as needed.   Yes Historical Provider, MD  levothyroxine (SYNTHROID, LEVOTHROID) 75 MCG tablet Take 75 mcg by mouth daily. 03/19/12  Yes Pecola Lawless, MD  lidocaine-prilocaine (EMLA) cream 1 application as needed.   Yes Historical Provider, MD  metoprolol succinate (TOPROL-XL) 50 MG 24 hr tablet Take 1 tablet (50 mg total) by mouth daily. Take with  or immediately following a meal. 05/14/12  Yes Pecola Lawless, MD  pomalidomide (POMALYST) 4 MG capsule Take 1 capsule (4 mg total) by mouth daily. for 21 days; then 7 days off 06/13/12  Yes Ladene Artist, MD  pomalidomide (POMALYST) 4 MG capsule Take 4 mg by mouth daily. Take one capsule by mouth for 21 days;then take one capsule by mouth for 7 days then stop   Yes Historical Provider, MD  potassium chloride SA (K-DUR,KLOR-CON) 20 MEQ tablet Take 1 tablet (20 mEq total) by mouth 2 (two) times daily. 05/14/12  Yes Pecola Lawless, MD  prochlorperazine (COMPAZINE) 10 MG tablet Take 10 mg by mouth every 6 (six) hours as needed. For nausea   Yes Historical Provider, MD  ranitidine (ZANTAC) 150 MG tablet Take 150 mg by mouth as needed.   Yes Historical Provider, MD  simvastatin (ZOCOR) 20 MG tablet Take 10 mg by mouth at bedtime. 03/19/12  Yes Pecola Lawless, MD  traMADol (ULTRAM) 50 MG tablet Take 1 tablet (50 mg total) by mouth every 6 (six) hours as needed for pain. 06/28/12  Yes Ladene Artist, MD    Inpatient medications:    . acyclovir  200 mg Oral BID  . albuterol  2 puff Inhalation TID  . amitriptyline  20 mg Oral QHS  . bortezomib SQ  1.3 mg/m2 (Treatment Plan Actual) Subcutaneous Once  . dexamethasone  40 mg Oral Once  . famotidine  10 mg Oral Daily  . feeding supplement  1 Container Oral TID BM  . furosemide  40 mg Intravenous Q8H  . levothyroxine  75 mcg Oral Q breakfast  . megestrol  200 mg Oral BID  . ondansetron  8 mg Oral Once  . pantoprazole  40 mg Oral Daily    Labs: Basic Metabolic Panel:  Lab 07/20/12 1191 07/18/12 0400 07/17/12 0740 07/16/12 0500 07/14/12 0720  NA 132* 133* 134* 133* 135  K 3.6 3.2* 3.7 3.4* 3.7  CL 99 100 101 99 99  CO2 17* 19 20 20 21   GLUCOSE 186* 138* 123* 104* 116*  BUN 25* 24* 24* 28* 42*  CREATININE 1.66* 1.40* 1.39* 1.45* 1.62*  ALB -- -- -- -- --  CALCIUM 8.2* 8.2* 8.5 8.3* 8.3*  PHOS -- -- -- -- --   Liver Function Tests:  Lab  07/18/12 0400  AST 23  ALT 40*  ALKPHOS 47  BILITOT 1.3*  PROT 10.2*  ALBUMIN 2.4*   No results found for this basename: LIPASE:3,AMYLASE:3 in the last 168 hours No results found for this basename: AMMONIA:3 in the last 168 hours CBC:  Lab 07/20/12 0600 07/18/12 0400 07/17/12 0740 07/16/12  0500  WBC 29.9* 16.9* 12.3* 9.8  NEUTROABS -- 2.5 -- --  HGB 9.0* 9.4* 9.8* 8.1*  HCT 26.6* 27.9* 28.7* 24.4*  MCV 94.7 94.6 94.1 97.6  PLT 31* 31* 32* 33*   PT/INR: @labrcntip (inr:5) Cardiac Enzymes: No results found for this basename: CKTOTAL:5,CKMB:5,CKMBINDEX:5,TROPONINI:5 in the last 168 hours CBG: No results found for this basename: GLUCAP:5 in the last 168 hours  Iron Studies: No results found for this basename: IRON:30,TIBC:30,TRANSFERRIN:30,FERRITIN:30 in the last 168 hours  Xrays/Other Studies: No results found.  Physical Exam:  Blood pressure 117/47, pulse 96, temperature 98.3 F (36.8 C), temperature source Oral, resp. rate 20, height 5\' 4"  (1.626 m), weight 71.94 kg (158 lb 9.6 oz), SpO2 100.00%.  Gen: frail, obese elderly female up in chair, dyspneic, sitting at 45 deg, not in severe distress Skin: no rash, cyanosis HEENT:  EOMI, sclera anicteric, throat clear Neck: + JVD, no bruits or LAN Chest: bilateral rales both lung bases, occ soft exp wheezing Heart: regular, no rub or gallop, no murmur Abdomen: soft, obese, nontender, no detectable ascites, no HSM Ext: 2-3+ pitting edema bilat LE's Neuro: alert, Ox3, no focal deficit, no asterixis   Impression/Plan 1. Pulmonary edema- persistent, worst on 10/4 film, still present on 10/8 film and still present clinically today. First real diuresis yesterday after lasix inc'd to 40 iv bid.  Cause is combination of fluid overload, diast HF and CKD IV. Will increase diuretics significantly to try and effect a brisk diuresis. She is actively dyspneic at rest and cannot lie flat.  Not sure that this problem will resolve, patient is  frail and prognosis is quite guarded. I did speak with husband to be sure he knew that the situation is serious and may not improve, and he said he was aware. Pt is DNR.  2.   Multiple myeloma- severe and refractory. Would consider palliative care referral if not already done 3.  CKD IV- this is likely due to myeloma 4.  Diast HF 5.  Hx asthma 6.  HTN- was on BB at home, nothing here. BP 110/47  Thanks for the referral, will follow.    Vinson Moselle  MD [facility] [phone] pgr    [phone] cell 07/20/2012, 3:56 PM

## 2012-07-20 NOTE — Progress Notes (Signed)
Pt  pulled foley and she does not want  It replaced .

## 2012-07-21 LAB — CBC
MCV: 94.2 fL (ref 78.0–100.0)
Platelets: 24 10*3/uL — CL (ref 150–400)
RBC: 2.74 MIL/uL — ABNORMAL LOW (ref 3.87–5.11)
WBC: 45.3 10*3/uL — ABNORMAL HIGH (ref 4.0–10.5)

## 2012-07-21 LAB — URINE MICROSCOPIC-ADD ON

## 2012-07-21 LAB — HEMOGLOBIN AND HEMATOCRIT, BLOOD: Hemoglobin: 8 g/dL — ABNORMAL LOW (ref 12.0–15.0)

## 2012-07-21 LAB — URINALYSIS, ROUTINE W REFLEX MICROSCOPIC
Nitrite: NEGATIVE
Specific Gravity, Urine: 1.006 (ref 1.005–1.030)
Urobilinogen, UA: 0.2 mg/dL (ref 0.0–1.0)

## 2012-07-21 LAB — BASIC METABOLIC PANEL
CO2: 19 mEq/L (ref 19–32)
Chloride: 96 mEq/L (ref 96–112)
Sodium: 130 mEq/L — ABNORMAL LOW (ref 135–145)

## 2012-07-21 LAB — TYPE AND SCREEN
ABO/RH(D): A POS
Antibody Screen: NEGATIVE

## 2012-07-21 MED ORDER — POTASSIUM CHLORIDE CRYS ER 20 MEQ PO TBCR
40.0000 meq | EXTENDED_RELEASE_TABLET | Freq: Two times a day (BID) | ORAL | Status: AC
  Administered 2012-07-21 (×2): 40 meq via ORAL
  Filled 2012-07-21 (×2): qty 2

## 2012-07-21 MED ORDER — ALBUTEROL SULFATE (5 MG/ML) 0.5% IN NEBU
INHALATION_SOLUTION | RESPIRATORY_TRACT | Status: AC
  Filled 2012-07-21: qty 0.5

## 2012-07-21 MED ORDER — ALBUTEROL SULFATE (5 MG/ML) 0.5% IN NEBU
2.5000 mg | INHALATION_SOLUTION | Freq: Three times a day (TID) | RESPIRATORY_TRACT | Status: DC
  Administered 2012-07-21 – 2012-07-23 (×7): 2.5 mg via RESPIRATORY_TRACT
  Filled 2012-07-21 (×6): qty 0.5

## 2012-07-21 MED ORDER — POTASSIUM CHLORIDE 10 MEQ/100ML IV SOLN
10.0000 meq | INTRAVENOUS | Status: AC
  Administered 2012-07-21 (×3): 10 meq via INTRAVENOUS
  Filled 2012-07-21 (×3): qty 100

## 2012-07-21 NOTE — Progress Notes (Signed)
Patient ID: DAJHA GELARDI  female  ZOX:[id number]    DOB: 09-17-1926    DOA: 07/09/2012  PCP: Marga Melnick, MD  Brief narrative:  78 year old very pleasant female with multiple medical comorbidities including but not limited to multiple myeloma who has completed cycle 6 melphalan/prednisone 12/29/2011. The IgM level was increased on 01/22/2012 and she subsequently began Pomalidomide on 02/26/2012 with weekly dexamethasone. She began cycle 3 on 04/26/2012 and subsequent serum M spike and IgM level were slightly lower on 03/18/2012 and stable since 05/17/2012. The most recent IgM around 7700. In addition, her hospital course is complicated due to decompensated CHF for which she is taking lasix IV while we are closely monitoring her renal function which is improving 07/19/12: Velcade/Decadron weekly started per Dr Truett Perna.  Subjective: Shortness of breath still there, very weak, peripheral edema improving. Daughter at the bedside.   Objective: Weight change:   Intake/Output Summary (Last 24 hours) at 07/21/12 1404 Last data filed at 07/21/12 1032  Gross per 24 hour  Intake      0 ml  Output   1650 ml  Net  -1650 ml   Blood pressure 110/41, pulse 99, temperature 98.1 F (36.7 C), temperature source Oral, resp. rate 20, height 5\' 4"  (1.626 m), weight 71.94 kg (158 lb 9.6 oz), SpO2 99.00%.  Physical Exam: General: Alert and awake, oriented x3, not in any acute distress. HEENT: anicteric sclera, pupils reactive to light and accommodation, EOMI CVS: S1-S2 clear, no murmur rubs or gallops Chest: Diminished breath sounds bilaterally, no wheezing, rales or rhonchi Abdomen: soft nontender, nondistended, normal bowel sounds, no organomegaly Extremities: no cyanosis, clubbing. 2-3+ edema noted bilaterally to thighs   Lab Results: Basic Metabolic Panel:  Lab 07/21/12 8119 07/20/12 0600  NA 130* 132*  K 2.9* 3.6  CL 96 99  CO2 19 17*  GLUCOSE 121* 186*  BUN 29* 25*  CREATININE 1.76* 1.66*   CALCIUM 8.3* 8.2*  MG -- --  PHOS -- --   Liver Function Tests:  Lab 07/18/12 0400  AST 23  ALT 40*  ALKPHOS 47  BILITOT 1.3*  PROT 10.2*  ALBUMIN 2.4*   CBC:  Lab 07/21/12 0610 07/20/12 0600 07/18/12 0400  WBC 45.3* 29.9* --  NEUTROABS -- -- 2.5  HGB 8.7* 9.0* --  HCT 25.8* 26.6* --  MCV 94.2 94.7 --  PLT 24* 31* --     Micro Results: Recent Results (from the past 240 hour(s))  TECHNOLOGIST REVIEW     Status: Normal   Collection Time   07/15/2012  8:34 AM      Component Value Range Status Comment   Technologist Review     Final    Value: Few Metas and Myelocytes present, occ plasma cells, rouleaux  MRSA PCR SCREENING     Status: Normal   Collection Time   08/08/2012  4:43 PM      Component Value Range Status Comment   MRSA by PCR NEGATIVE  NEGATIVE Final     Studies/Results: Dg Chest 2 View  07/16/2012  *RADIOLOGY REPORT*  Clinical Data: Shortness of breath.  Possible congestive heart failure.  CHEST - 2 VIEW  Comparison: Chest x-ray 07/13/2012.  Findings:  There is cephalization of the pulmonary vasculature and slight indistinctness of the interstitial markings suggestive of mild pulmonary edema.  Bibasilar opacities (left greater than right) may reflect superimposed areas of atelectasis and/or consolidation, with superimposed small left pleural effusion.  The heart size is borderline enlarged. The  patient is rotated to the right on today's exam, resulting in distortion of the mediastinal contours and reduced diagnostic sensitivity and specificity for mediastinal pathology. Atherosclerosis in the thoracic aorta.  Post procedural changes of vertebroplasty are noted at multiple levels throughout the mid and lower thoracic spine.  Orthopedic fixation hardware is noted in the lower lumbar spine (incompletely visualized).  Well-defined sclerotic lesion in the right humeral head is unchanged compared to numerous remote prior examinations, and favored to represent a bone island.   Status post left shoulder hemiarthroplasty.  IMPRESSION: 1.  Mild-moderate congestive heart failure with small left pleural effusion. 2.  Bibasilar areas of atelectasis and/or consolidation. 3.  Additional findings, similar to prior examinations, as above.   Original Report Authenticated By: [name], M.D.    Dg Chest Port 1 View  07/13/2012  *RADIOLOGY REPORT*  Clinical Data: CHF and anemia.  History of multiple myeloma.  PORTABLE CHEST - 1 VIEW  Comparison: 06/03/2012  Findings: There is evidence of moderate congestive heart failure and small bilateral pleural effusions.  Appearance of the Port-A- Cath is stable.  Stable heart size.  IMPRESSION: Moderate CHF with bilateral small pleural effusions.   Original Report Authenticated By: Reola Calkins, M.D.     Medications: Scheduled Meds:    . acyclovir  200 mg Oral BID  . albuterol  2 puff Inhalation TID  . amitriptyline  20 mg Oral QHS  . famotidine  10 mg Oral Daily  . feeding supplement  1 Container Oral TID BM  . furosemide  120 mg Intravenous Q8H  . levothyroxine  75 mcg Oral Q breakfast  . megestrol  200 mg Oral BID  . metolazone  5 mg Oral Daily  . pantoprazole  40 mg Oral Daily  . potassium chloride  10 mEq Intravenous Q1 Hr x 3  . potassium chloride  40 mEq Oral BID   Continuous Infusions:    Assessment/Plan:  Principal Problem:        Acute decompensated diastolic CHF: Still significant shortness of breath and peripheral edema. Albumin 2.4  per ECHO in 02/2012 EF 60-65% and BNP elevated, cardiology hasn't followed recently unable to obtain VQ scan as patient is not able to lie flat   Appreciate renal assistance, on high dose lasix and metolazone.  Symptomatic anemia  Patient has received 4 units PRBC's since admission, H/H at baseline             Thrombocytopenia; PLT 24  Likely due to multiple myeloma and sequela of chemotherapy  Will transfuse 1 unit          Acute kidney injury: Likely secondary to  diuretics, Cr Slightly worse today  Monitor BMET,  See #1         Hypothyroidism  Continue synthroid 75 mcg daily        Multiple Myeloma  IgM increased and per oncology recommendations they will consider referal to [facility] for further MM management as patient has been on multiple treatments. On trial of velcade and decadron weekly, first dose started on 07/19/12.         Peripheral neuropathy  Continue amitriptyline        Sacral decubitus ulcer  appreciate wound care consult   DVT Prophylaxis: SCD's  Code Status: DNR  Disposition: not ready. Updated patient and her daughter at bed side. Had a lengthy discussion with patient's husband and daughter regarding guarded prognosis, possibility of hospice for symptomatic Rx. Her family informs that she is  not ready to hear about the word "hospice". I requested the family to have discussion with Dr Truett Perna about his opinion.   LOS: 9 days   Jaelin Devincentis M.D. [facility] 07/21/2012, 2:04 PM Pager: [phone]  If 7PM-7AM, please contact night-coverage [email or web address] Password [email or web address]

## 2012-07-21 NOTE — Progress Notes (Signed)
CRITICAL VALUE ALERT  Critical value received:  Platelet ct 24  Date of notification:  07/21/2012  Time of notification:  0722  Critical value read back:yes  Nurse who received alert:  D.Dareen Piano RN    MD notified (1st page):  Dr Isidoro Donning  Time of first page:  0722  MD notified (2nd page):  Time of second page:  Responding MD:  Dr. Isidoro Donning  Time MD responded:  [phone]

## 2012-07-21 NOTE — Plan of Care (Signed)
Problem: Consults Goal: Nutrition Consult-if indicated Outcome: Progressing Consult requested

## 2012-07-21 NOTE — Progress Notes (Addendum)
Subjective: Slightly less dyspnea  Objective Vital signs in last 24 hours: Filed Vitals:   07/20/12 2144 07/21/12 0536 07/21/12 1400 07/21/12 1401  BP: 111/50 110/41 78/48 121/49  Pulse: 99 99 106   Temp: 98.9 F (37.2 C) 98.1 F (36.7 C) 97.9 F (36.6 C)   TempSrc: Oral Oral Oral   Resp: 19 20 23    Height:      Weight:      SpO2: 100% 99% 95%    Weight change:   Intake/Output Summary (Last 24 hours) at 07/21/12 1602 Last data filed at 07/21/12 1500  Gross per 24 hour  Intake      0 ml  Output   2100 ml  Net  -2100 ml   Labs: Basic Metabolic Panel:  Lab 07/21/12 1610 07/20/12 0600 07/18/12 0400 07/17/12 0740 07/16/12 0500  NA 130* 132* 133* 134* 133*  K 2.9* 3.6 3.2* 3.7 3.4*  CL 96 99 100 101 99  CO2 19 17* 19 20 20   GLUCOSE 121* 186* 138* 123* 104*  BUN 29* 25* 24* 24* 28*  CREATININE 1.76* 1.66* 1.40* 1.39* 1.45*  ALB -- -- -- -- --  CALCIUM 8.3* 8.2* 8.2* 8.5 8.3*  PHOS -- -- -- -- --   Liver Function Tests:  Lab 07/18/12 0400  AST 23  ALT 40*  ALKPHOS 47  BILITOT 1.3*  PROT 10.2*  ALBUMIN 2.4*   No results found for this basename: LIPASE:3,AMYLASE:3 in the last 168 hours No results found for this basename: AMMONIA:3 in the last 168 hours CBC:  Lab 07/21/12 0610 07/20/12 0600 07/18/12 0400 07/17/12 0740  WBC 45.3* 29.9* 16.9* 12.3*  NEUTROABS -- -- 2.5 --  HGB 8.7* 9.0* 9.4* 9.8*  HCT 25.8* 26.6* 27.9* 28.7*  MCV 94.2 94.7 94.6 94.1  PLT 24* 31* 31* 32*   PT/INR: @labrcntip (inr:5) Cardiac Enzymes: No results found for this basename: CKTOTAL:5,CKMB:5,CKMBINDEX:5,TROPONINI:5 in the last 168 hours CBG: No results found for this basename: GLUCAP:5 in the last 168 hours  Iron Studies: No results found for this basename: IRON:30,TIBC:30,TRANSFERRIN:30,FERRITIN:30 in the last 168 hours  Physical Exam:  Blood pressure 121/49, pulse 106, temperature 97.9 F (36.6 C), temperature source Oral, resp. rate 23, height 5\' 4"  (1.626 m), weight 71.94 kg  (158 lb 9.6 oz), SpO2 95.00%.  Gen: frail, obese elderly female up in chair, looks tired but not as dyspneic as yesterday Skin: no rash, cyanosis  HEENT: EOMI, sclera anicteric, throat clear  Neck: + JVD, no bruits or LAN  Chest: bilateral rales both lung bases, occ soft exp wheezing  Heart: regular, no rub or gallop, no murmur  Abdomen: soft, obese, nontender, no detectable ascites, no HSM  Ext: 2+ pitting edema bilat LE's  Neuro: alert, Ox3, no focal deficit, no asterixis   Impression/Plan   1. Dyspnea/pulm edema- due to combination of fluid overload, diast HF and CKD. On high-dose diuretics, some improvement from yesterday clinically. Cont therapy.  2. Multiple myeloma, advanced, refractory IgM. Poor prognosis, per chart pt does not want hospice at this time 3. CKD IV- baseline SCr 1.2-1.9. This is likely due to the myeloma. 4. Diast HF  5. Hx asthma  6. HTN- was on BB at home, nothing here. BP 110/47 7. Chronic back pain due to myeloma and comp fx's 8. DNR  Karen Moselle  MD [facility] [phone] pgr    [phone] cell 07/21/2012, 4:02 PM

## 2012-07-21 NOTE — Plan of Care (Signed)
Problem: Phase III Progression Outcomes Goal: Activity at appropriate level-compared to baseline (UP IN CHAIR FOR HEMODIALYSIS)  Outcome: Progressing Sitting up in chair for several hours

## 2012-07-22 ENCOUNTER — Other Ambulatory Visit: Payer: Self-pay | Admitting: Certified Registered Nurse Anesthetist

## 2012-07-22 DIAGNOSIS — D72822 Plasmacytosis: Secondary | ICD-10-CM

## 2012-07-22 LAB — CBC
HCT: 24.1 % — ABNORMAL LOW (ref 36.0–46.0)
MCV: 93.8 fL (ref 78.0–100.0)
Platelets: 53 10*3/uL — ABNORMAL LOW (ref 150–400)
RBC: 2.57 MIL/uL — ABNORMAL LOW (ref 3.87–5.11)
WBC: 47.6 10*3/uL — ABNORMAL HIGH (ref 4.0–10.5)

## 2012-07-22 LAB — DIFFERENTIAL
Basophils Absolute: 0 10*3/uL (ref 0.0–0.1)
Lymphocytes Relative: 94 % — ABNORMAL HIGH (ref 12–46)
Monocytes Relative: 4 % (ref 3–12)
Neutro Abs: 1 10*3/uL — ABNORMAL LOW (ref 1.7–7.7)
Neutrophils Relative %: 2 % — ABNORMAL LOW (ref 43–77)

## 2012-07-22 LAB — BASIC METABOLIC PANEL
BUN: 31 mg/dL — ABNORMAL HIGH (ref 6–23)
CO2: 19 mEq/L (ref 19–32)
Chloride: 97 mEq/L (ref 96–112)
Creatinine, Ser: 1.91 mg/dL — ABNORMAL HIGH (ref 0.50–1.10)

## 2012-07-22 MED ORDER — LORAZEPAM 2 MG/ML IJ SOLN: 0.5000 mg | Freq: Three times a day (TID) | INTRAMUSCULAR | Status: DC | PRN

## 2012-07-22 MED ORDER — BOOST / RESOURCE BREEZE PO LIQD
1.0000 | Freq: Three times a day (TID) | ORAL | Status: DC
  Administered 2012-07-22: 1 via ORAL

## 2012-07-22 MED ORDER — MORPHINE SULFATE 2 MG/ML IJ SOLN
2.0000 mg | Freq: Once | INTRAMUSCULAR | Status: AC
  Administered 2012-07-22: 2 mg via INTRAVENOUS

## 2012-07-22 MED ORDER — MORPHINE SULFATE 2 MG/ML IJ SOLN
1.0000 mg | INTRAMUSCULAR | Status: DC | PRN
  Administered 2012-07-22 – 2012-07-23 (×2): 2 mg via INTRAVENOUS
  Filled 2012-07-22 (×2): qty 1

## 2012-07-22 MED ORDER — ATROPINE ORAL SOLUTION 0.08 MG/ML
0.8000 mg | ORAL | Status: DC | PRN
  Administered 2012-07-22: 0.8 mg via ORAL
  Filled 2012-07-22 (×2): qty 10

## 2012-07-22 MED ORDER — ALBUTEROL SULFATE (5 MG/ML) 0.5% IN NEBU: 2.5000 mg | INHALATION_SOLUTION | RESPIRATORY_TRACT | Status: DC | PRN

## 2012-07-22 MED ORDER — MORPHINE SULFATE 2 MG/ML IJ SOLN
INTRAMUSCULAR | Status: AC
  Administered 2012-07-22: 2 mg via INTRAVENOUS
  Filled 2012-07-22: qty 1

## 2012-07-22 NOTE — Progress Notes (Signed)
[facility] ROUNDING NOTE   Subjective:   Interval History: somnolent  Objective:  Vital signs in last 24 hours:  Temp:  [97.9 F (36.6 C)-98.9 F (37.2 C)] 98.1 F (36.7 C) (10/14 0506) Pulse Rate:  [100-114] 100  (10/14 0506) Resp:  [22-26] 22  (10/14 0506) BP: (78-124)/(48-52) 115/48 mmHg (10/14 0506) SpO2:  [92 %-100 %] 100 % (10/14 0802)  Weight change:  Filed Weights   07/15/12 0640 07/15/12 0900 07/16/12 0600  Weight: 71.625 kg (157 lb 14.5 oz) 70.988 kg (156 lb 8 oz) 71.94 kg (158 lb 9.6 oz)    Intake/Output: I/O last 3 completed shifts: In: 1106.5 [P.O.:320; I.V.:250; Blood:350.5; IV Piggyback:186] Out: 2800 [Urine:2800]   Intake/Output this shift:  Total I/O In: -  Out: 200 [Urine:200]  CVS- RRR RS- CTA ABD- BS present soft non-distended EXT- no edema   Basic Metabolic Panel:  Lab 07/22/12 1610 07/21/12 0610 07/20/12 0600 07/18/12 0400 07/17/12 0740  NA 132* 130* 132* 133* 134*  K 3.9 2.9* 3.6 3.2* 3.7  CL 97 96 99 100 101  CO2 19 19 17* 19 20  GLUCOSE 121* 121* 186* 138* 123*  BUN 31* 29* 25* 24* 24*  CREATININE 1.91* 1.76* 1.66* 1.40* 1.39*  CALCIUM 8.9 8.3* 8.2* -- --  MG -- -- -- -- --  PHOS -- -- -- -- --    Liver Function Tests:  Lab 07/18/12 0400  AST 23  ALT 40*  ALKPHOS 47  BILITOT 1.3*  PROT 10.2*  ALBUMIN 2.4*   No results found for this basename: LIPASE:5,AMYLASE:5 in the last 168 hours No results found for this basename: AMMONIA:3 in the last 168 hours  CBC:  Lab 07/22/12 0515 07/21/12 2135 07/21/12 0610 07/20/12 0600 07/18/12 0400 07/17/12 0740  WBC 47.6* -- 45.3* 29.9* 16.9* 12.3*  NEUTROABS -- -- -- -- 2.5 --  HGB 8.2* 8.0* 8.7* 9.0* 9.4* --  HCT 24.1* 23.8* 25.8* 26.6* 27.9* --  MCV 93.8 -- 94.2 94.7 94.6 94.1  PLT 53* -- 24* 31* 31* 32*    Cardiac Enzymes: No results found for this basename: CKTOTAL:5,CKMB:5,CKMBINDEX:5,TROPONINI:5 in the last 168 hours  BNP: No components found with this  basename: POCBNP:5  CBG: No results found for this basename: GLUCAP:5 in the last 168 hours  Microbiology: Results for orders placed during the hospital encounter of 07/29/2012  MRSA PCR SCREENING     Status: Normal   Collection Time   08/04/2012  4:43 PM      Component Value Range Status Comment   MRSA by PCR NEGATIVE  NEGATIVE Final     Coagulation Studies: No results found for this basename: LABPROT:5,INR:5 in the last 72 hours  Urinalysis:  Basename 07/21/12 0321  COLORURINE YELLOW  LABSPEC 1.006  PHURINE 5.0  GLUCOSEU NEGATIVE  HGBUR LARGE*  BILIRUBINUR NEGATIVE  KETONESUR NEGATIVE  PROTEINUR NEGATIVE  UROBILINOGEN 0.2  NITRITE NEGATIVE  LEUKOCYTESUR TRACE*      Imaging: No results found.   Medications:        . acyclovir  200 mg Oral BID  . albuterol  2.5 mg Nebulization TID  . albuterol      . amitriptyline  20 mg Oral QHS  . famotidine  10 mg Oral Daily  . feeding supplement  1 Container Oral TID BM  . furosemide  120 mg Intravenous Q8H  . levothyroxine  75 mcg Oral Q breakfast  . megestrol  200 mg Oral BID  . metolazone  5 mg  Oral Daily  . pantoprazole  40 mg Oral Daily  . potassium chloride  10 mEq Intravenous Q1 Hr x 3  . potassium chloride  40 mEq Oral BID  . DISCONTD: albuterol  2 puff Inhalation TID   albuterol, bisacodyl, heparin lock flush, HYDROmorphone, morphine injection, ondansetron, ondansetron, sodium chloride, DISCONTD: albuterol  Assessment/ Plan:  1. Dyspnea/pulm edema- due to combination of fluid overload, diast HF and CKD. On high-dose diuretics, some improvement from yesterday clinically. Cont therapy.  2. Multiple myeloma, advanced, refractory IgM. Poor prognosis, per chart pt does not want hospice at this time  3. CKD IV- baseline SCr 1.2-1.9. This is likely due to the myeloma.  4. Diast HF  5. Hx asthma  6. HTN- was on BB at home, nothing here. BP 110/47  7. Chronic back pain due to myeloma and comp fx's  8. DNR   Poor  response to diuretics, patient declining and family have been advised to seek hospice. Will sign off     LOS: 10 Karen Barron W @TODAY @9 :37 AM

## 2012-07-22 NOTE — Progress Notes (Signed)
Patient ID: Karen Barron  female  JYN:[id number]    DOB: January 26, 1926    DOA: 08/03/2012  PCP: Marga Melnick, MD  Brief narrative:  78 year old very pleasant female with multiple medical comorbidities including but not limited to multiple myeloma who has completed cycle 6 melphalan/prednisone 12/29/2011. The IgM level was increased on 01/22/2012 and she subsequently began Pomalidomide on 02/26/2012 with weekly dexamethasone. She began cycle 3 on 04/26/2012 and subsequent serum M spike and IgM level were slightly lower on 03/18/2012 and stable since 05/17/2012. The most recent IgM around 7700. In addition, her hospital course is complicated due to decompensated CHF for which she is taking lasix IV while we are closely monitoring her renal function which is improving 07/19/12: Velcade/Decadron weekly started per Dr Truett Perna. 07/22/12: SW consulted for residential hospice placement. Family accepting the guarded and poor prognosis.   Subjective: S: very weak, moaning, somnolent and no significant improvement with high dose lasix and metolazone. Granddaughter and husband at bed side.    Objective: Weight change:   Intake/Output Summary (Last 24 hours) at 07/22/12 1454 Last data filed at 07/22/12 1400  Gross per 24 hour  Intake  886.5 ml  Output   1600 ml  Net -713.5 ml   Blood pressure 87/51, pulse 106, temperature 99 F (37.2 C), temperature source Oral, resp. rate 16, height 5\' 4"  (1.626 m), weight 71.94 kg (158 lb 9.6 oz), SpO2 99.00%.  Physical Exam: General: somnolent, very deconditioned, moaning today. HEENT: anicteric sclera, pupils reactive to light and accommodation, EOMI CVS: S1-S2 clear, no murmur rubs or gallops Chest: coarse rhonchi throughout b/l Abdomen: soft nontender, normal bowel sounds Extremities: no cyanosis, clubbing. 3+ edema noted bilaterally to thighs, tight   Lab Results: Basic Metabolic Panel:  Lab 07/22/12 8657 07/21/12 0610  NA 132* 130*  K 3.9 2.9*  CL  97 96  CO2 19 19  GLUCOSE 121* 121*  BUN 31* 29*  CREATININE 1.91* 1.76*  CALCIUM 8.9 8.3*  MG -- --  PHOS -- --   Liver Function Tests:  Lab 07/18/12 0400  AST 23  ALT 40*  ALKPHOS 47  BILITOT 1.3*  PROT 10.2*  ALBUMIN 2.4*   CBC:  Lab 07/22/12 0515 07/21/12 2135 07/21/12 0610 07/18/12 0400  WBC 47.6* -- 45.3* --  NEUTROABS -- -- -- 2.5  HGB 8.2* 8.0* -- --  HCT 24.1* 23.8* -- --  MCV 93.8 -- 94.2 --  PLT 53* -- 24* --     Micro Results: Recent Results (from the past 240 hour(s))  MRSA PCR SCREENING     Status: Normal   Collection Time   2012/08/03  4:43 PM      Component Value Range Status Comment   MRSA by PCR NEGATIVE  NEGATIVE Final     Studies/Results: Dg Chest 2 View  07/16/2012  *RADIOLOGY REPORT*  Clinical Data: Shortness of breath.  Possible congestive heart failure.  CHEST - 2 VIEW  Comparison: Chest x-ray 07/13/2012.  Findings:  There is cephalization of the pulmonary vasculature and slight indistinctness of the interstitial markings suggestive of mild pulmonary edema.  Bibasilar opacities (left greater than right) may reflect superimposed areas of atelectasis and/or consolidation, with superimposed small left pleural effusion.  The heart size is borderline enlarged. The patient is rotated to the right on today's exam, resulting in distortion of the mediastinal contours and reduced diagnostic sensitivity and specificity for mediastinal pathology. Atherosclerosis in the thoracic aorta.  Post procedural changes of vertebroplasty are  noted at multiple levels throughout the mid and lower thoracic spine.  Orthopedic fixation hardware is noted in the lower lumbar spine (incompletely visualized).  Well-defined sclerotic lesion in the right humeral head is unchanged compared to numerous remote prior examinations, and favored to represent a bone island.  Status post left shoulder hemiarthroplasty.  IMPRESSION: 1.  Mild-moderate congestive heart failure with small left  pleural effusion. 2.  Bibasilar areas of atelectasis and/or consolidation. 3.  Additional findings, similar to prior examinations, as above.   Original Report Authenticated By: [name], M.D.    Dg Chest Port 1 View  07/13/2012  *RADIOLOGY REPORT*  Clinical Data: CHF and anemia.  History of multiple myeloma.  PORTABLE CHEST - 1 VIEW  Comparison: 06/03/2012  Findings: There is evidence of moderate congestive heart failure and small bilateral pleural effusions.  Appearance of the Port-A- Cath is stable.  Stable heart size.  IMPRESSION: Moderate CHF with bilateral small pleural effusions.   Original Report Authenticated By: Reola Calkins, M.D.     Medications: Scheduled Meds:    . acyclovir  200 mg Oral BID  . albuterol  2.5 mg Nebulization TID  . albuterol      . amitriptyline  20 mg Oral QHS  . famotidine  10 mg Oral Daily  . feeding supplement  1 Container Oral TID BM  . furosemide  120 mg Intravenous Q8H  . levothyroxine  75 mcg Oral Q breakfast  . metolazone  5 mg Oral Daily  . pantoprazole  40 mg Oral Daily  . potassium chloride  10 mEq Intravenous Q1 Hr x 3  . potassium chloride  40 mEq Oral BID  . DISCONTD: albuterol  2 puff Inhalation TID  . DISCONTD: feeding supplement  1 Container Oral TID BM  . DISCONTD: megestrol  200 mg Oral BID   Continuous Infusions:    Assessment/Plan:  Principal Problem:        Acute decompensated diastolic CHF: Still significant shortness of breath and peripheral edema, coarse BS. Albumin 2.4. Very deconditioned and declining rapidly despite high dose diuretics.   per ECHO in 02/2012 EF 60-65% and BNP elevated, cardiology hasn't followed recently unable to obtain VQ scan as patient is not able to lie flat   Appreciate renal assistance, on high dose lasix and metolazone with no significant improvement. Discussed with Dr Hyman Hopes in detail, poor prognosis, recommended hospice at this time.  Symptomatic anemia  Patient has received 4 units  PRBC's since admission, H/H at baseline             Thrombocytopenia; PLT now 53 <- 24 after 1unit transfusion Likely due to multiple myeloma and sequela of chemotherapy           Acute kidney injury: Likely secondary to diuretics, Cr Slightly worse today  Monitor BMET,  See #1         Hypothyroidism  Continue synthroid 75 mcg daily        Multiple Myeloma  On trial of velcade and decadron weekly, first dose started on 07/19/12. Reviewed Dr Nolon Rod note, doesnot appear to be responding to chemo, has rapidly declined, agree with hospice        Peripheral neuropathy  Continue amitriptyline        Sacral decubitus ulcer  appreciate wound care consult   DVT Prophylaxis: SCD's  Code Status: DNR  Disposition: not ready. Discussed in detail with patient's husband in detail, poor prognosis, days-weeks, requested for residential hospice, d/w SW.  LOS: 10 days   RAI,RIPUDEEP M.D. [facility] 07/22/2012, 2:54 PM Pager: [phone]  If 7PM-7AM, please contact night-coverage [email or web address] Password [email or web address]

## 2012-07-22 NOTE — Care Management Note (Signed)
    Page 1 of 2   07/22/2012     5:39:50 PM   CARE MANAGEMENT NOTE 07/22/2012  Patient:  Karen Barron, Karen Barron   Account Number:  [id number]  Date Initiated:  07/19/2012  Documentation initiated by:  [facility]  Subjective/Objective Assessment:   78 year old female admitted with anemia, Has hx of Multiple Myeloma.     Action/Plan:   Pt will d/c home once medically stable.   Anticipated DC Date:  07/22/2012   Anticipated DC Plan:  HOME W HOME HEALTH SERVICES  In-house referral  Clinical Social Worker  Hospice / Palliative Care      DC Planning Services  CM consult      [facility] Choice  NA   Choice offered to / List presented to:  NA   DME arranged  NA      DME agency  NA     HH arranged  NA      HH agency  NA   Status of service:  Completed, signed off Medicare Important Message given?   (If response is "NO", the following Medicare IM given date fields will be blank) Date Medicare IM given:   Date Additional Medicare IM given:    Discharge Disposition:    Per UR Regulation:    If discussed at Long Length of Stay Meetings, dates discussed:    Comments:  07/22/2012 Raynelle Bring BSN CCM [phone] Plans for Hospice residential. Cm advised by CSW that patient has a bed at [facility] with anticipated discharge on 07/29/2012.   Reyanne Hussar RN BSN 07/19/12  Pt recommending HHPT, no orders written yet.

## 2012-07-22 NOTE — Progress Notes (Signed)
PT Cancellation Note  Patient Details Name: Karen Barron MRN: [id number] DOB: 12-13-1925   Cancelled Treatment:    Reason Eval/Treat Not Completed: Medical issues which prohibited therapy Per husband: "There's nothing you can do for her. "  PT to sign off.   Ebony Hail [facility] 07/22/2012, 4:26 PM

## 2012-07-22 NOTE — Progress Notes (Signed)
INITIAL ADULT NUTRITION ASSESSMENT Date: 07/22/2012   Time: 11:46 AM Reason for Assessment: Consult   INTERVENTION: D/C Ensure pudding. Resource Breeze TID. Recommend MD continue to monitor and replace potassium as well as magnesium and phosphorus as pt at high risk of refeeding syndrome. Awaiting pt and family decision on hospice/comfort care and goals of nutrition. Recommend enteral nutrition if pt/family desire to be aggressive in care otherwise comfort feeds. Will monitor.   Pt meets criteria for severe malnutrition of acute illness AEB <50% estimated energy intake in the past 3-4 weeks per husband's statement and pt with moderate to severe fluid accumulation in RLE and LLE.   ASSESSMENT: Female 78 y.o.  Dx: Diastolic CHF, acute on chronic  Food/Nutrition Related Hx: Pt's husband reports that for the past 3-4 weeks pt's intake "hasn't been enough to keep a canary alive". He reports he has tried every kind of food, drink, and nutritional supplement, however she has been refusing them all. Pt started on Megace 07/16/12 per family request, however husband states it has not helped yet. He reports she ate none of her breakfast. Unclear how much weight pt may have lost recently as pt with deep pitting RLE and LLE edema. Noted MD trying to get pt to have discussion with oncologist regarding hospice. Nursing reports pt refusing Ensure pudding - noted pt on Resource Breeze during previous admission, will order this instead.   Hx:  Past Medical History  Diagnosis Date  . Personal history of colonic polyps   . Osteoporosis   . Waldenstrom's macroglobulinemia   . Diverticular disease   . Thyroid disease   . Fatty liver   . Hypertension   . Diabetes in pregnancy   . Hyperlipidemia   . Spinal stenosis   . Asthma   . Anemia   . DJD (degenerative joint disease)   . Esophageal reflux   . Hiatal hernia   . Chronic diastolic CHF (congestive heart failure)     Echo on 05/30/11 revealed nl LV  systolic function, EF 65-70%, no WMAs, grade 2 diastolic dysfunction, mildly dilated RA/RV, peak PA pressure  . Multiple myeloma in remission   . CHF (congestive heart failure) 5/20-03/11/12   Related Meds:  Scheduled Meds:   . acyclovir  200 mg Oral BID  . albuterol  2.5 mg Nebulization TID  . albuterol      . amitriptyline  20 mg Oral QHS  . famotidine  10 mg Oral Daily  . feeding supplement  1 Container Oral TID BM  . furosemide  120 mg Intravenous Q8H  . levothyroxine  75 mcg Oral Q breakfast  . megestrol  200 mg Oral BID  . metolazone  5 mg Oral Daily  . pantoprazole  40 mg Oral Daily  . potassium chloride  10 mEq Intravenous Q1 Hr x 3  . potassium chloride  40 mEq Oral BID  . DISCONTD: albuterol  2 puff Inhalation TID   Continuous Infusions:  PRN Meds:.albuterol, bisacodyl, heparin lock flush, HYDROmorphone, morphine injection, ondansetron, ondansetron, sodium chloride, DISCONTD: albuterol  Ht: 5\' 4"  (162.6 cm)  Wt: 158 lb 9.6 oz (71.94 kg) (standing scale) with deep pitting RLE and LLE edema  Ideal Wt: 120 lb % Ideal Wt: 131  Usual Wt: 165 lb % Usual Wt: 95  Body mass index is 27.22 kg/(m^2).   Labs:  CMP     Component Value Date/Time   NA 132* 07/22/2012 0515   NA 133* 07/10/2012 0834   NA 136  01/22/2012 1118   K 3.9 07/22/2012 0515   K 5.0 Repeated and Verified 08-05-2012 0834   K 3.5 01/22/2012 1118   CL 97 07/22/2012 0515   CL 102 2012/08/05 0834   CL 98 01/22/2012 1118   CO2 19 07/22/2012 0515   CO2 14* 08/05/2012 0834   CO2 28 01/22/2012 1118   GLUCOSE 121* 07/22/2012 0515   GLUCOSE 173* 08/05/12 0834   GLUCOSE 96 01/22/2012 1118   BUN 31* 07/22/2012 0515   BUN 49.0* 05-Aug-2012 0834   BUN 15 01/22/2012 1118   CREATININE 1.91* 07/22/2012 0515   CREATININE 1.9* 08/05/2012 0834   CREATININE 1.1 01/22/2012 1118   CREATININE 1.08 10/18/2006 1023   CALCIUM 8.9 07/22/2012 0515   CALCIUM 8.3* 08/05/12 0834   CALCIUM 8.3 01/22/2012 1118   PROT 10.2*  07/18/2012 0400   ALBUMIN 2.4* 07/18/2012 0400   AST 23 07/18/2012 0400   ALT 40* 07/18/2012 0400   ALKPHOS 47 07/18/2012 0400   BILITOT 1.3* 07/18/2012 0400   GFRNONAA 23* 07/22/2012 0515   GFRAA 27* 07/22/2012 0515    Intake/Output Summary (Last 24 hours) at 07/22/12 1151 Last data filed at 07/22/12 1115  Gross per 24 hour  Intake  986.5 ml  Output   1600 ml  Net -613.5 ml   Last BM - 10/11  Diet Order: Cardiac  Supplements/Tube Feeding: Ensure pudding TID  IVF:    Estimated Nutritional Needs:   Kcal:1450-1800 Protein:85-100g Fluid:1.4-1.8L  NUTRITION DIAGNOSIS: -Inadequate oral intake (NI-2.1).  Status: Ongoing  RELATED TO: pt refusing to eat  AS EVIDENCE BY: husband's statement  MONITORING/EVALUATION(Goals): 1. If pt and family desire aggressive care, recommend initiate enteral nutrition 2. If goal is comfort care, recommend comfort feeds  EDUCATION NEEDS: -No education needs identified at this time   Dietitian #: [phone]  DOCUMENTATION CODES Per approved criteria  -Severe malnutrition in the context of acute illness or injury    Marshall Cork 07/22/2012, 11:46 AM

## 2012-07-22 NOTE — Progress Notes (Signed)
Clinical Child psychotherapist received notification from [facility], Forrestine Him that bed available tomorrow at [facility] for pt. Clinical Social Worker notified pt spouse and pt daughter. Clinical Social Worker notified MD. Clinical Social Worker to facilitate pt discharge needs to [facility].  Jacklynn Lewis, MSW, LCSWA  Clinical Social Work [phone]

## 2012-07-22 NOTE — Consult Note (Signed)
[facility] Liaison: Received request from CSW Selena Lesser for family interest in [facility]. Chart reviewed. Met with spouse and daughter. Both agreeable to transfer to [facility] Tuesday 07/25/2012. Dr. Truett Perna to attend. Please fax DC summary to [phone] and have RN call report to [phone]. Thank you. Forrestine Him LCSW [phone]

## 2012-07-22 NOTE — Progress Notes (Signed)
[place]   DOB:11/17/1925   ZO#:[id number]   JWJ#:[id number]  Subjective: She appears ill. She complains of dyspnea. No pain. Objective: Filed Vitals:   07/22/12 0506  BP: 115/48  Pulse: 100  Temp: 98.1 F (36.7 C)  Resp: 22    Body mass index is 27.22 kg/(m^2).  Intake/Output Summary (Last 24 hours) at 07/22/12 1108 Last data filed at 07/22/12 0800  Gross per 24 hour  Intake  986.5 ml  Output   1350 ml  Net -363.5 ml       Lungs clear -- upper airway rhonchi bilaterally, good air movement bilaterally, increased respiratory rate  Heart regular rate and rhythm  Vascular-pitting edema at the lower leg bilaterally             Port-A-Cath without erythema   Labs:  Lab Results  Component Value Date   WBC 47.6* 07/22/2012   HGB 8.2* 07/22/2012   HCT 24.1* 07/22/2012   MCV 93.8 07/22/2012   PLT 53* 07/22/2012   NEUTROABS 2.5 07/18/2012    IgM 7780, serum M spike 4.37 on 07/11/2012 IgM 8140 on 07/15/2012  Basic Metabolic Panel:  Lab 07/22/12 6213 07/21/12 0610 07/20/12 0600 07/18/12 0400 07/17/12 0740  NA 132* 130* 132* 133* 134*  K 3.9 2.9* -- -- --  CL 97 96 99 100 101  CO2 19 19 17* 19 20  GLUCOSE 121* 121* 186* 138* 123*  BUN 31* 29* 25* 24* 24*  CREATININE 1.91* 1.76* 1.66* 1.40* 1.39*  CALCIUM 8.9 8.3* 8.2* 8.2* 8.5  MG -- -- -- -- --  PHOS -- -- -- -- --   GFR Estimated Creatinine Clearance: 21.3 ml/min (by C-G formula based on Cr of 1.91). Liver Function Tests:  Lab 07/18/12 0400  AST 23  ALT 40*  ALKPHOS 47  BILITOT 1.3*  PROT 10.2*  ALBUMIN 2.4*    Studies:  No results found.  Assessment: 78 y.o. [place] woman admitted with anemia, thrombocytopenia, diastolic dysfunction 1. Multiple myeloma treated with subcutaneous Velcade and Cytoxan/Decadron on a weekly schedule beginning in May 2011. She completed 1 year of treatment on 01/24/2011. The serum M-spike and IgM level were increased beginning in May 2012. She began treatment with  carfilzomib on 05/25/2011. She completed 2 cycles. The serum M-spike and IgM level were higher on 07/12/2011. She began cycle 1 melphalan/prednisone 07/25/2011. The IgM level was improved on 08/21/2011. She began cycle 2 melphalan/prednisone on 08/29/2011. The melphalan dose was reduced to 8 mg daily for 4 days beginning with cycle 2. The melphalan was dose reduced to 6 mg daily for 4 days beginning with the third cycle of melphalan/prednisone on 09/27/2011. She completed a fourth cycle of melphalan/prednisone beginning on 10/25/2011. The IgM level was stable on 11/15/2011. She completed cycle 6 melphalan/prednisone beginning 12/29/2011. The IgM level was increased on 01/22/2012. She began Pomalidomide on 02/26/2012 with weekly dexamethasone. She began cycle 3 on 04/26/2012. The serum M spike and IgM level were slightly lower on 03/18/2012. The IgM level was improved on 04/12/2012, stable 05/17/2012. She completed cycle 4 Pomalidomide beginning 05/24/2012. The IgM level was stable and the serum M spike was improved on 06/14/2012. She began cycle 5 of pomalidomide on 06/21/2012. The IgM level and serum M spike were higher on 08/03/2012 . Velcade/Decadron was resumed on 07/19/2012. 2. Hospitalization with shortness of breath/acute diastolic heart failure 02/26/2012 through 03/11/2012. 3. Hospitalization with dyspnea/hypoxia secondary to volume overload in the setting of diastolic heart failure on 05/26/2011. 4. Hospitalization 06/02/2011  with dyspnea. She was found to have marked anemia with a hemoglobin of 6.7. She received a red cell transfusion 09/27/2011. 5. History of congestive heart failure status post hospital admission 05/26/2011. 6. Thrombocytopenia secondary to multiple myeloma and pomalidomide. Stable. 7. T7 compression fracture status post kyphoplasty 11/23/2007 by Dr. Noel Gerold. 8. Peripheral neuropathy status post evaluation by Dr. Anne Hahn. The neuropathy is most likely related to Velcade  therapy. 9. Herpes zoster at the left abdomen and chest wall status post Valtrex therapy. 10. Postherpetic neuralgia. 11. History of neutropenia secondary to Velcade and Cytoxan. There is persistent neutropenia. 12. Pain and swelling of the proximal interphalangeal joint of the right 3rd finger when she was here on 02/21/2011, improved. She was treated with a Medrol Dosepak. 13. Cough, wheezing, dyspnea, and pleuritic right-sided chest pain and here on 01/22/2012. Chest CT showed right upper lobe pneumonia. She completed a 10 day course of Avelox on 01/31/2012. 14. Persistent severe anemia. She last transfused on 07/16/2012 15. Left wrist pain/tenderness, erythema and edema-02/02/2012,? Gout versus a joint infection. Improved after Keflex and a Medrol Dosepak. 16. Fever/chills 06/03/2012 with no localizing source for infection. Urinalysis was unremarkable. A blood culture was negative. Chest x-ray was negative. She completed a course of Levaquin. She had no further fever. 17. Leukocytosis-the lymphocyte count is elevated, review the peripheral blood smear on 07/19/2012 was consistent with plasma cell leukemia.   Her clinical status has declined since I saw her on 07/19/2012. She has increased respiratory distress. She has developed a peripheral plasmacytosis. I discussed the poor prognosis with her family. I recommend a hospice referral for home care or consideration of [facility]. I explained to her husband that she may not survive beyond days or a few weeks. It is very unlikely that she would  Improved wi further systemic therapy. Recommendations: 1. oxygen, morphine sulfate for respiratory distress 2. [facility] hospice referral 3. I will continue discussions with Ms. Vandervort and her family regarding disposition plans.   Majesty Stehlin, [name] 07/22/2012

## 2012-07-22 NOTE — Progress Notes (Signed)
Clinical Social Worker attempted to meet with pt and pt husband in regard to residential hospice placement. Pt needing to use bedside commode at this time and Nurse Tech and pt husband assisting pt with that. Pt husband asked Clinical Social Worker to return after lunch once pt was settled. Clinical Social Worker to follow up.  Jacklynn Lewis, MSW, LCSWA  Clinical Social Work [phone]

## 2012-07-22 NOTE — Clinical Social Work Psychosocial (Signed)
     Clinical Social Work Department BRIEF PSYCHOSOCIAL ASSESSMENT 07/22/2012  Patient:  Karen Barron, Karen Barron     Account Number:  [id number]     Admit date:  07/25/2012  Clinical Social Worker:  Jacelyn Grip  Date/Time:  07/22/2012 01:30 PM  Referred by:  Physician  Date Referred:  07/22/2012 Referred for  Residential hospice placement   Other Referral:   Interview type:  Family Other interview type:    PSYCHOSOCIAL DATA Living Status:  HUSBAND Admitted from facility:   Level of care:   Primary support name:  Lyda Jester Curtiss/[phone] Primary support relationship to patient:  SPOUSE Degree of support available:   strong    CURRENT CONCERNS Current Concerns  Post-Acute Placement   Other Concerns:    SOCIAL WORK ASSESSMENT / PLAN CSW received referral for residential hospice placement. CSW met with pt husband alone in family room as pt was sleeping at this time. RN made aware that pt husband meeting with this CSW in order to respond to pt needs.    CSW discussed recommendation for residential hospice facility with pt husband. Pt husband confirmed that pt oncologist and hospitalist had discussed with pt spouse this morning. CSW offered residential hospice choice to pt husband. Pt husband stated that recommendation from oncologist was for [facility]. CSW encouraged pt spouse to review other hospice facilities as [facility] does not have bed availability today and unsure of when a bed would become available. Pt spouse reports that pt oncologist and hospitalist stated to pt spouse that pt could remain in hospital until [facility] bed became available. CSW provided support and explained that once pt is determined to be medically stable from a medical stand point in hospital that a pt is unable to remain in hospital to await bed a bed at [facility]. Pt spouse expressed understanding and stated that he would review list and discuss with family about secondary options. CSW  discussed that other secondary plan would be for SNF placement and pt spouse stated that pt and pt spouse do not want pt placed in SNF. Pt spouse agreeable to referral being made to [facility] today and for CSW to follow up tomorrow about secondary options if [facility] bed is not available. CSW contacted [employer], Forrestine Him and placed referral and pt spouse made aware that [facility] liaison would be contacting pt spouse. CSW to continue to follow to assist with residential hospice placement.   Assessment/plan status:  Psychosocial Support/Ongoing Assessment of Needs Other assessment/ plan:   discharge planning   Information/referral to community resources:   Residential Hospice Facility List    PATIENTS/FAMILYS RESPONSE TO PLAN OF CARE: Pt resting comfortably at this time and CSW able to meet with pt spouse alone to discuss residential hospice. Pt spouse is hopeful for [facility] as pt oncologist is able to follow up with pt at facility. Pt spouse is a strong support for pt and is dedicated to keeping pt comfortable during this time.

## 2012-07-22 NOTE — Plan of Care (Signed)
Problem: Phase I Progression Outcomes Goal: Dyspnea controlled at rest (HF) Outcome: Not Met (add Reason) Respiratory status deteriorating. MD has spoken with spouse regarding status.

## 2012-07-23 ENCOUNTER — Other Ambulatory Visit: Payer: Self-pay | Admitting: Certified Registered Nurse Anesthetist

## 2012-07-23 DIAGNOSIS — K769 Liver disease, unspecified: Secondary | ICD-10-CM

## 2012-07-23 DIAGNOSIS — C880 Waldenstrom macroglobulinemia: Secondary | ICD-10-CM

## 2012-07-23 DIAGNOSIS — N19 Unspecified kidney failure: Secondary | ICD-10-CM

## 2012-07-23 MED ORDER — MORPHINE SULFATE 4 MG/ML IJ SOLN
INTRAMUSCULAR | Status: AC
  Administered 2012-07-23: 4 mg
  Filled 2012-07-23: qty 1

## 2012-07-23 MED ORDER — ATROPINE SULFATE 1 % OP SOLN
2.0000 [drp] | OPHTHALMIC | Status: DC | PRN
  Administered 2012-07-23 (×3): 2 [drp] via SUBLINGUAL
  Filled 2012-07-23: qty 2

## 2012-07-23 MED ORDER — SCOPOLAMINE 1 MG/3DAYS TD PT72
1.0000 | MEDICATED_PATCH | TRANSDERMAL | Status: DC
  Administered 2012-07-23: 1.5 mg via TRANSDERMAL
  Filled 2012-07-23: qty 1

## 2012-07-23 MED ORDER — MORPHINE SULFATE 2 MG/ML IJ SOLN
2.0000 mg | INTRAMUSCULAR | Status: DC | PRN
  Administered 2012-07-23: 2 mg via INTRAVENOUS
  Filled 2012-07-23: qty 1

## 2012-08-07 ENCOUNTER — Ambulatory Visit: Payer: Medicare Other | Admitting: Cardiology

## 2012-08-09 NOTE — Progress Notes (Signed)
[place]   DOB:09/07/1926   ZO#:[id number]   [id number]  Subjective: She is nonverbal this morning. Multiple family members at the bedside Objective: Filed Vitals:   07/22/12 1418  BP: 87/51  Pulse: 106  Temp: 99 F (37.2 C)  Resp: 16    Body mass index is 27.22 kg/(m^2).  Intake/Output Summary (Last 24 hours) at 07/19/2012 1219 Last data filed at 07/30/2012 0900  Gross per 24 hour  Intake      0 ml  Output    250 ml  Net   -250 ml       Lungs clear --clear anteriorly, increased respiratory rate  Heart regular rate and rhythm, tachycardia  Vascular-pitting edema at the lower leg bilaterally             Port-A-Cath without erythema             Neurologic-she opens her eyes, moans, not following commands   Labs:  Lab Results  Component Value Date   WBC 47.6* 07/22/2012   HGB 8.2* 07/22/2012   HCT 24.1* 07/22/2012   MCV 93.8 07/22/2012   PLT 53* 07/22/2012   NEUTROABS 1.0* 07/22/2012    IgM 7780, serum M spike 4.37 on 2012-07-14 IgM 8140 on 07/15/2012  Basic Metabolic Panel:  Lab 07/22/12 9811 07/21/12 0610 07/20/12 0600 07/18/12 0400 07/17/12 0740  NA 132* 130* 132* 133* 134*  K 3.9 2.9* -- -- --  CL 97 96 99 100 101  CO2 19 19 17* 19 20  GLUCOSE 121* 121* 186* 138* 123*  BUN 31* 29* 25* 24* 24*  CREATININE 1.91* 1.76* 1.66* 1.40* 1.39*  CALCIUM 8.9 8.3* 8.2* 8.2* 8.5  MG -- -- -- -- --  PHOS -- -- -- -- --   GFR Estimated Creatinine Clearance: 21.3 ml/min (by C-G formula based on Cr of 1.91). Liver Function Tests:  Lab 07/18/12 0400  AST 23  ALT 40*  ALKPHOS 47  BILITOT 1.3*  PROT 10.2*  ALBUMIN 2.4*    Studies:  No results found.  Assessment: 78 y.o. [place] woman admitted with anemia, thrombocytopenia, diastolic dysfunction 1. Multiple myeloma treated with subcutaneous Velcade and Cytoxan/Decadron on a weekly schedule beginning in May 2011. She completed 1 year of treatment on 01/24/2011. The serum M-spike and IgM level were increased  beginning in May 2012. She began treatment with carfilzomib on 05/25/2011. She completed 2 cycles. The serum M-spike and IgM level were higher on 07/12/2011. She began cycle 1 melphalan/prednisone 07/25/2011. The IgM level was improved on 08/21/2011. She began cycle 2 melphalan/prednisone on 08/29/2011. The melphalan dose was reduced to 8 mg daily for 4 days beginning with cycle 2. The melphalan was dose reduced to 6 mg daily for 4 days beginning with the third cycle of melphalan/prednisone on 09/27/2011. She completed a fourth cycle of melphalan/prednisone beginning on 10/25/2011. The IgM level was stable on 11/15/2011. She completed cycle 6 melphalan/prednisone beginning 12/29/2011. The IgM level was increased on 01/22/2012. She began Pomalidomide on 02/26/2012 with weekly dexamethasone. She began cycle 3 on 04/26/2012. The serum M spike and IgM level were slightly lower on 03/18/2012. The IgM level was improved on 04/12/2012, stable 05/17/2012. She completed cycle 4 Pomalidomide beginning 05/24/2012. The IgM level was stable and the serum M spike was improved on 06/14/2012. She began cycle 5 of pomalidomide on 06/21/2012. The IgM level and serum M spike were higher on Jul 14, 2012 . Velcade/Decadron was resumed on 07/19/2012. 2. Hospitalization with shortness of breath/acute diastolic heart failure  02/26/2012 through 03/11/2012. 3. Hospitalization with dyspnea/hypoxia secondary to volume overload in the setting of diastolic heart failure on 05/26/2011. 4. Hospitalization 06/02/2011 with dyspnea. She was found to have marked anemia with a hemoglobin of 6.7. She received a red cell transfusion 09/27/2011. 5. History of congestive heart failure status post hospital admission 05/26/2011. 6. Thrombocytopenia secondary to multiple myeloma and pomalidomide. Stable. 7. T7 compression fracture status post kyphoplasty 11/23/2007 by Dr. Noel Gerold. 8. Peripheral neuropathy status post evaluation by Dr. Anne Hahn. The  neuropathy is most likely related to Velcade therapy. 9. Herpes zoster at the left abdomen and chest wall status post Valtrex therapy. 10. Postherpetic neuralgia. 11. History of neutropenia secondary to Velcade and Cytoxan. There is persistent neutropenia. 12. Pain and swelling of the proximal interphalangeal joint of the right 3rd finger when she was here on 02/21/2011, improved. She was treated with a Medrol Dosepak. 13. Cough, wheezing, dyspnea, and pleuritic right-sided chest pain and here on 01/22/2012. Chest CT showed right upper lobe pneumonia. She completed a 10 day course of Avelox on 01/31/2012. 14. Persistent severe anemia. She last transfused on 07/16/2012 15. Left wrist pain/tenderness, erythema and edema-02/02/2012,? Gout versus a joint infection. Improved after Keflex and a Medrol Dosepak. 16. Fever/chills 06/03/2012 with no localizing source for infection. Urinalysis was unremarkable. A blood culture was negative. Chest x-ray was negative. She completed a course of Levaquin. She had no further fever.        17. Leukocytosis-she has developed a peripheral plasmacytosis  There has been a marketed decline in her clinical status over the past 2 days. I discussed the situation with her husband and other family members yesterday evening and again this morning. She has developed multiorgan failure in the setting of refractory multiple myeloma. It is possible she has developed hyperviscosity syndrome. I do not recommend plasmapheresis given her current status and poor overall prognosis.  The family agrees with a comfort care approach.  Recommendations: 1. Discontinue medications not essential for comfort 2. She should remain hospitalized for terminal care   [facility], [name] 08/05/2012

## 2012-08-09 NOTE — Progress Notes (Signed)
Patient found not breathing, with no pulse.  Death witnessed by Dr. Truett Perna, in room, and Isatu Verdi.  Dr. Isidoro Donning notified.  [facility] notified, pt. Not suitable for anything due to age.   Philomena Doheny RN

## 2012-08-09 NOTE — Discharge Summary (Signed)
Physician Discharge Summary  Expiration Note/ Death Summary  Karen Barron  MR#: [id number]  DOB:Sep 30, 1926  Date of Admission: July 13, 2012 Date of Death: 07/15/2012  Attending Physician:RAI,RIPUDEEP  Patient's PCP: Marga Melnick, MD  Consults: Treatment Team:  Ladene Artist, MD Maree Krabbe, MD [facility]  Cause of Death: Refractory Multiple Myeloma with multi-organ failure  Secondary Diagnoses  .Diastolic CHF, acute on chronic .Multiple myeloma .Acute on chronic diastolic heart failure .HYPOTHYROIDISM .WALDENSTROMS MACROGLOBULINEMIA .Peripheral neuropathy .GERD .Thrombocytopenia   Brief H and P and Hospital course: For complete details please refer to admission H and P, but in brief, 78 year old female with multiple myeloma was admitted from cancer center on 07-13-12 for symptomatic anemia and increased dyspnea, loss of appetite with deconditioning. Patient had multiple medical comorbidities including but not limited to multiple myeloma who had completed cycle 6 melphalan/prednisone 12/29/2011. The IgM level was increased on 01/22/2012 and she subsequently began Pomalidomide on 02/26/2012 with weekly dexamethasone. She began cycle 3 on 04/26/2012 and subsequent serum M spike and IgM level were slightly lower on 03/18/2012 and stable since 05/17/2012. Patient had been declining over the past 5-6 months with volume overload, dyspnea and diastolic CHF, anemia and progression of her multiple myeloma. During this hospitalization, her hospital course was complicated due to decompensated CHF for which she is taking lasix IV. Initially cardiology was following the patient, they did recommend a VQ scan to rule out pulmonary embolism however patient had significant orthopnea and was not able to lie flat for the test despite multiple attempts. Patient was started on high dose Lasix with renal consultation (Dr. Arlean Hopping) on 07/20/2012 with metolazone, however with no significant  improvement in the peripheral edema and overall dyspnea. Patient was started on Velcade/Decadron trial weekly on 07/19/2012 by Dr Truett Perna. Her clinical status had rapidly declined in the last few days with increased respiratory distress despite high-dose diuretics and chemotherapy. She also developed multi-organ failure, leukocytosis and peripheral plasmacytosis, thrombocytopenia for which she received one unit of platelet transfusion on 07/21/2012. After multiple discussions with Dr. Truett Perna and myself with the family regarding her poor prognosis, no significant improvement and rapid decline, family requested for complete comfort care. Patient passed away today on 08/02/2012 at 7:10pm.    Signed:  RAI,RIPUDEEP M.D. [facility] July 24, 2012, 7:36 PM Pager: [phone]  If 7PM-7AM, please contact night-coverage [email or web address] Password [email or web address]

## 2012-08-09 NOTE — Progress Notes (Signed)
Clinical Social Worker received notification from MD that pt is not stable for transition to [facility]. Clinical Social Worker notified [facility] liaison, Forrestine Him. Clinical Social Worker to remain available to provide support to pt family as needed.  Jacklynn Lewis, MSW, LCSWA  Clinical Social Work [phone]

## 2012-08-09 NOTE — Progress Notes (Signed)
Atropine effective in reducing secretions. Respirations quieter and less dyspneic. Daughter and other family members remain at bedside.

## 2012-08-09 NOTE — Progress Notes (Signed)
Nutrition Brief Note  - Noted family requested comfort care and pt with rapid decline. Recommend comfort feeds per pt and family wishes. Nutrition signing off.   Levon Hedger MS, RD, LDN [phone] Pager [phone] After Hours Pager

## 2012-08-09 NOTE — Progress Notes (Signed)
Patient ID: ODEAL WELDEN  female  ZOX:[id number]    DOB: 01/01/1926    DOA: 07/27/2012  PCP: Marga Melnick, MD  Brief narrative and hospital course so far:  Briefly, 78 year old very pleasant female with multiple medical comorbidities including but not limited to multiple myeloma who has completed cycle 6 melphalan/prednisone 12/29/2011. The IgM level was increased on 01/22/2012 and she subsequently began Pomalidomide on 02/26/2012 with weekly dexamethasone. She began cycle 3 on 04/26/2012 and subsequent serum M spike and IgM level were slightly lower on 03/18/2012 and stable since 05/17/2012. Patient has been declining over the past 5-6 months with volume overload, dyspnea and diastolic CHF, anemia and progression of her multiple myeloma. During this hospitalization, her hospital course was complicated due to decompensated CHF for which she is taking lasix IV. Initially cardiology was following the patient, did recommend a VQ scan to rule out pulmonary embolism however patient has significant orthopnea and has not been able to lie flat for the past despite multiple attempts. Patient was started on high dose Lasix with renal consultation (Dr. Arlean Hopping) on 07/20/2012 with metolazone, however with no significant improvement in the peripheral edema and overall dyspnea. Patient was started on Velcade/Decadron trial weekly on 07/19/2012 by Dr Truett Perna. Her clinical status has rapidly declined in the last few days with increased respiratory distress despite high-dose diuretics and chemotherapy. She has also developed a leukocytosis and peripheral plasmacytosis, thrombocytopenia for which she received one unit of platelet transfusion on 07/21/2012. After multiple discussions with Dr. Truett Perna and myself with the family regarding her poor prognosis, no significant improvement and rapid decline, family requested for comfort care and residential hospice. Per oncology, it is very unlikely that she would improve with any  further systemic therapy.  Consults:  Oncology: Dr Myrle Sheng Renal: Dr Arta Silence Cardiology: Ulyess Mort   Subjective: S: very weak, moaning, somnolent, increased respiratory distress and secretions multiple family members at the bedside including her husband and daughter.     Objective: Weight change:   Intake/Output Summary (Last 24 hours) at 2012/08/08 1123 Last data filed at 08-08-2012 0900  Gross per 24 hour  Intake      0 ml  Output    375 ml  Net   -375 ml   Blood pressure 87/51, pulse 106, temperature 99 F (37.2 C), temperature source Oral, resp. rate 16, height 5\' 4"  (1.626 m), weight 71.94 kg (158 lb 9.6 oz), SpO2 100.00%.  Physical Exam: General: somnolent, very deconditioned, moaning, opens eyes to name, inc secretions  CVS: S1-S2 clear, no murmur rubs or gallops Chest: coarse rhonchi throughout b/l, gurgling Abdomen: soft nontender, normal bowel sounds Extremities: 3+ edema noted bilaterally to thighs  Lab Results: Basic Metabolic Panel:  Lab 07/22/12 8119 07/21/12 0610  NA 132* 130*  K 3.9 2.9*  CL 97 96  CO2 19 19  GLUCOSE 121* 121*  BUN 31* 29*  CREATININE 1.91* 1.76*  CALCIUM 8.9 8.3*  MG -- --  PHOS -- --   Liver Function Tests:  Lab 07/18/12 0400  AST 23  ALT 40*  ALKPHOS 47  BILITOT 1.3*  PROT 10.2*  ALBUMIN 2.4*   CBC:  Lab 07/22/12 0515 07/21/12 2135 07/21/12 0610  WBC 47.6* -- 45.3*  NEUTROABS 1.0* -- --  HGB 8.2* 8.0* --  HCT 24.1* 23.8* --  MCV 93.8 -- 94.2  PLT 53* -- 24*     Micro Results: No results found for this or any previous visit (from the past 240 hour(s)).  Studies/Results: Dg Chest 2 View  07/16/2012  *RADIOLOGY REPORT*  Clinical Data: Shortness of breath.  Possible congestive heart failure.  CHEST - 2 VIEW  Comparison: Chest x-ray 07/13/2012.  Findings:  There is cephalization of the pulmonary vasculature and slight indistinctness of the interstitial markings suggestive of mild pulmonary edema.  Bibasilar opacities  (left greater than right) may reflect superimposed areas of atelectasis and/or consolidation, with superimposed small left pleural effusion.  The heart size is borderline enlarged. The patient is rotated to the right on today's exam, resulting in distortion of the mediastinal contours and reduced diagnostic sensitivity and specificity for mediastinal pathology. Atherosclerosis in the thoracic aorta.  Post procedural changes of vertebroplasty are noted at multiple levels throughout the mid and lower thoracic spine.  Orthopedic fixation hardware is noted in the lower lumbar spine (incompletely visualized).  Well-defined sclerotic lesion in the right humeral head is unchanged compared to numerous remote prior examinations, and favored to represent a bone island.  Status post left shoulder hemiarthroplasty.  IMPRESSION: 1.  Mild-moderate congestive heart failure with small left pleural effusion. 2.  Bibasilar areas of atelectasis and/or consolidation. 3.  Additional findings, similar to prior examinations, as above.   Original Report Authenticated By: [name], M.D.    Dg Chest Port 1 View  07/13/2012  *RADIOLOGY REPORT*  Clinical Data: CHF and anemia.  History of multiple myeloma.  PORTABLE CHEST - 1 VIEW  Comparison: 06/03/2012  Findings: There is evidence of moderate congestive heart failure and small bilateral pleural effusions.  Appearance of the Port-A- Cath is stable.  Stable heart size.  IMPRESSION: Moderate CHF with bilateral small pleural effusions.   Original Report Authenticated By: Reola Calkins, M.D.     Medications: Scheduled Meds:    . albuterol  2.5 mg Nebulization TID  . feeding supplement  1 Container Oral TID BM  .  morphine injection  2 mg Intravenous Once  . morphine      . DISCONTD: acyclovir  200 mg Oral BID  . DISCONTD: amitriptyline  20 mg Oral QHS  . DISCONTD: famotidine  10 mg Oral Daily  . DISCONTD: feeding supplement  1 Container Oral TID BM  . DISCONTD:  furosemide  120 mg Intravenous Q8H  . DISCONTD: levothyroxine  75 mcg Oral Q breakfast  . DISCONTD: megestrol  200 mg Oral BID  . DISCONTD: metolazone  5 mg Oral Daily  . DISCONTD: pantoprazole  40 mg Oral Daily   Continuous Infusions:    Assessment/Plan:  Principal Problem:        Acute decompensated diastolic CHF: Still significant shortness of breath and peripheral edema, coarse BS. Very deconditioned and declining rapidly despite high dose diuretics.   per ECHO in 02/2012 EF 60-65% and BNP elevated, unable to obtain VQ scan as patient is not able to lie flat   Lasix and metolzaone discontinued per husband's request along with other PO meds, now complete comfort care. Providing O2, morphine, Ativan, scopolamine patch, atropine PRN for symptomatic relief and comfort.   Symptomatic anemia  Patient has received 4 units PRBC's since admission            Thrombocytopenia; PLT 53 <- 24 after 1unit transfusion on labs 07/22/12 Likely due to multiple myeloma and sequela of chemotherapy           Acute kidney injury: Likely secondary to diuretics, Cr Slightly worse today  Monitor BMET,  See #1         Hypothyroidism  Dc'ed synthroid per family's request        Multiple Myeloma  On trial of velcade and decadron weekly, first dose started on 07/19/12. Per oncology, unlikely to improve with any further systemic therapy. Residential hospice bed was arranged however patient has declined significantly, poor prognosis, hours-days.    DVT Prophylaxis: SCD's  Code Status: DNR- comfort care  Disposition: Discussed in detail with the patient's family including husband and daughter, transfer to residential hospice cancelled as patient is rapidly declining. Now comfort care, prognosis hours-days.     LOS: 11 days   RAI,RIPUDEEP M.D. [facility] 07/22/2012, 11:23 AM Pager: [phone]  If 7PM-7AM, please contact night-coverage [email or web address] Password [email or web address]

## 2012-08-09 DEATH — deceased

## 2012-08-13 ENCOUNTER — Encounter (HOSPITAL_COMMUNITY): Payer: Medicare Other

## 2013-05-24 IMAGING — CT CT ANGIO CHEST
2 of 6 series · 19 of 36 positions shown · IV contrast (APPLIED)
Comparison: Chest radiograph dated 12/19/2011.

CLINICAL DATA: Dyspnea, cough, myeloma, evaluate for pneumonia or
pulmonary embolism

CT ANGIOGRAPHY CHEST
TECHNIQUE: Multidetector CT imaging of the chest using the
standard protocol during bolus administration of intravenous
contrast. Multiplanar reconstructed images including MIPs were
obtained and reviewed to evaluate the vascular anatomy.
Contrast: 80mL OMNIPAQUE IOHEXOL 300 MG/ML  SOLN

[Series 6: pe thins @ 1mm · axial · 0.64mm/px · z∈[+242,+468]mm · 18 of 251 slices shown]
[im 13/251  lung]
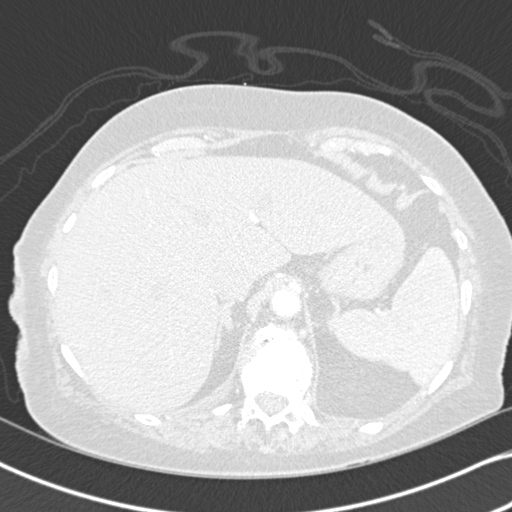
[im 26/251  mediastinal]
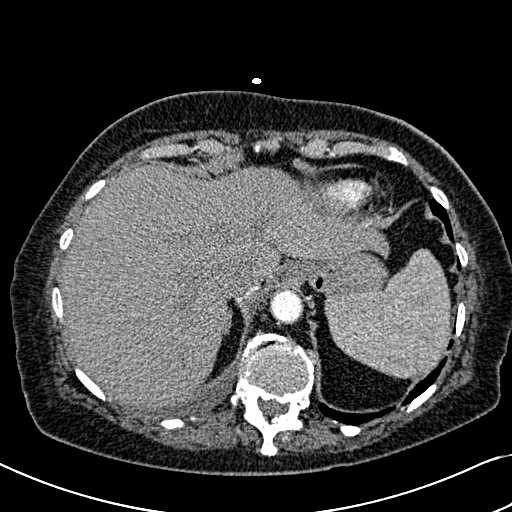
[im 38/251  lung]
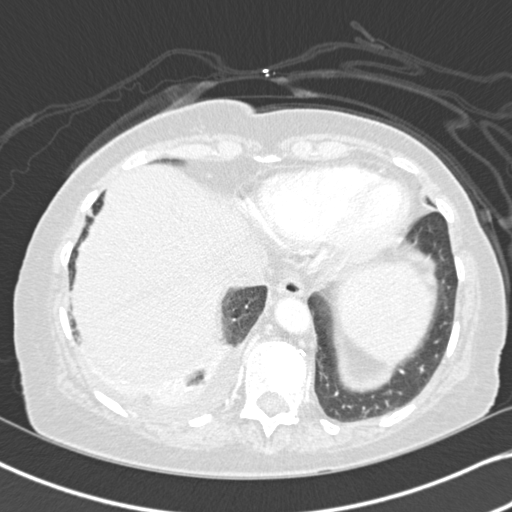
[im 51/251  mediastinal]
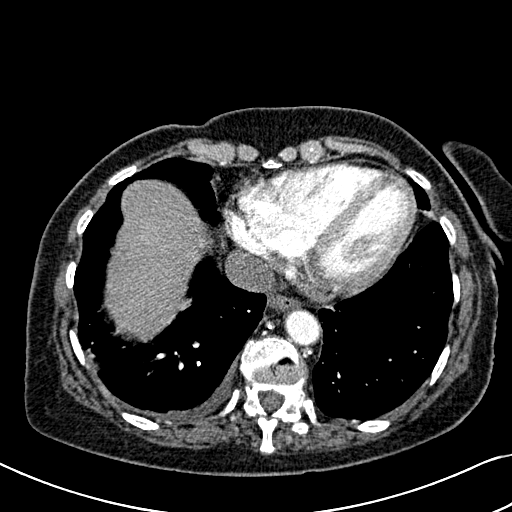
[im 63/251  lung]
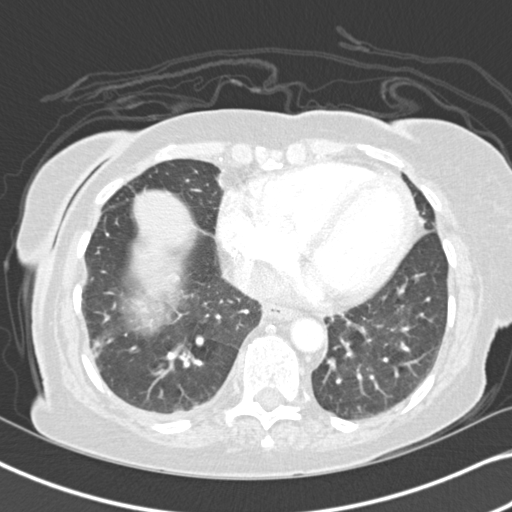
[im 76/251  mediastinal]
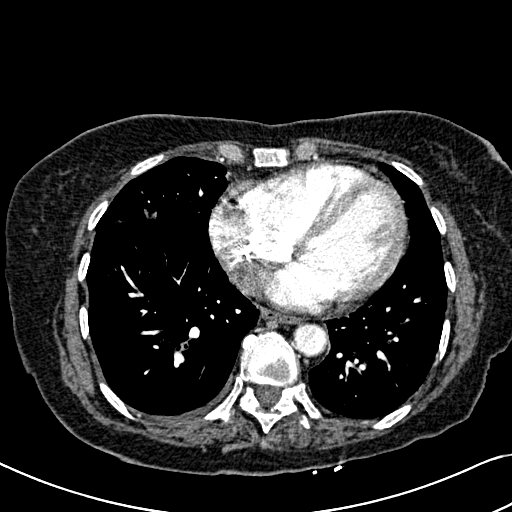
[im 88/251  lung]
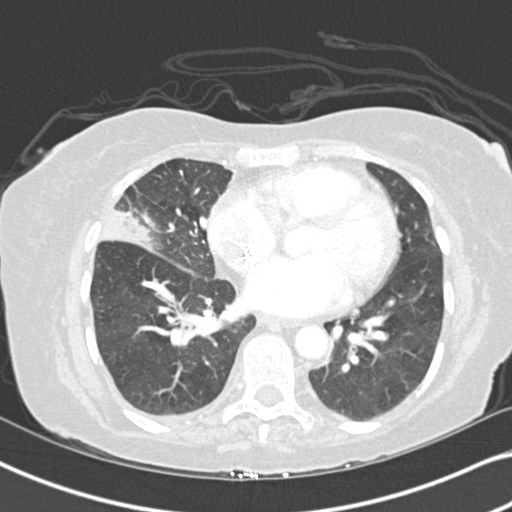
[im 101/251  mediastinal]
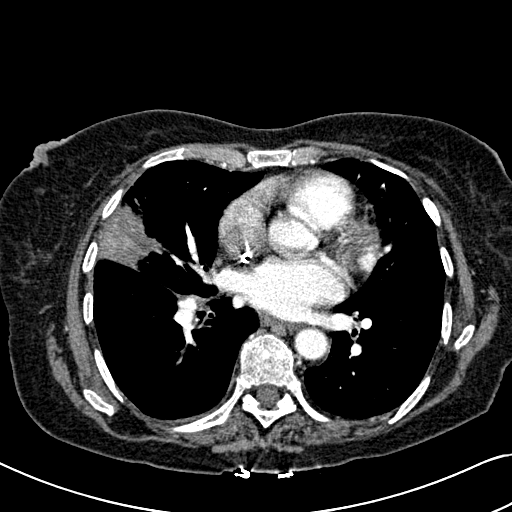
[im 113/251  lung]
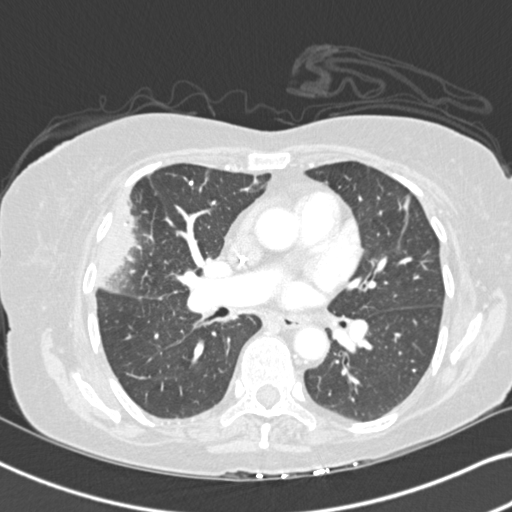
[im 138/251  mediastinal]
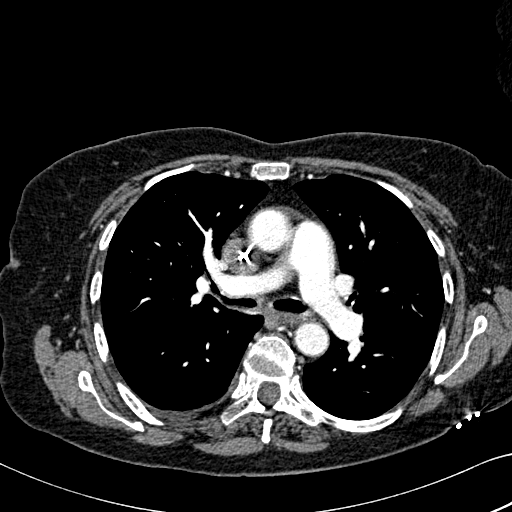
[im 151/251  lung]
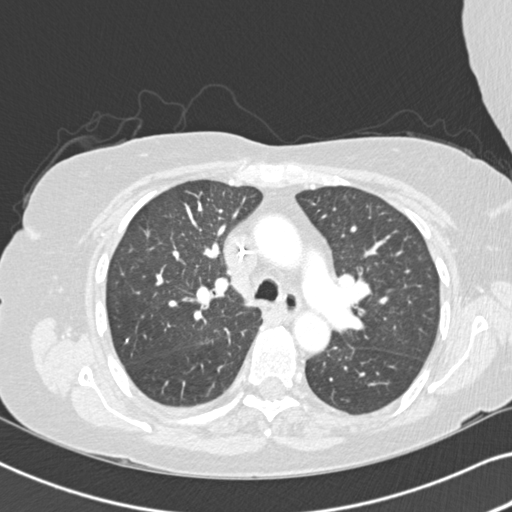
[im 163/251  mediastinal]
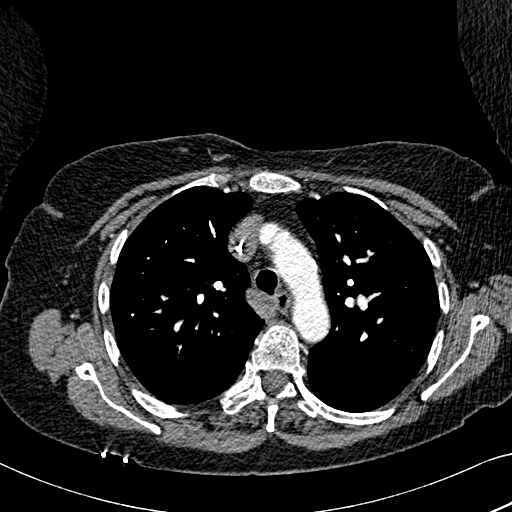
[im 176/251  lung]
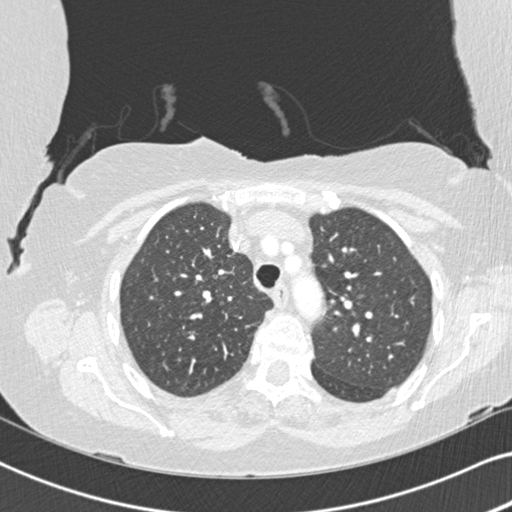
[im 188/251  mediastinal]
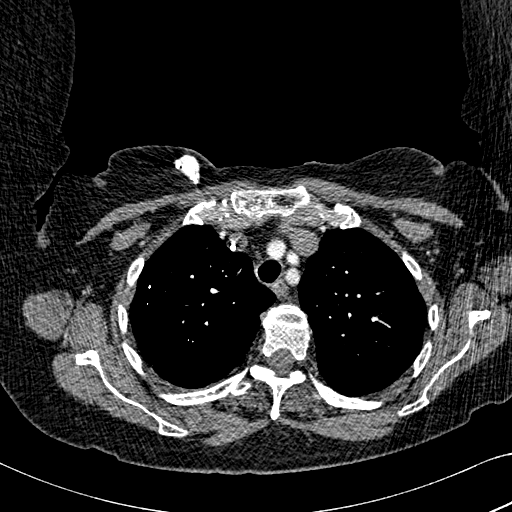
[im 201/251  lung]
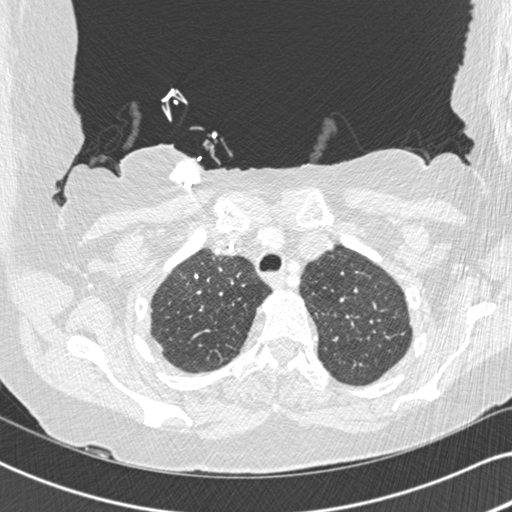
[im 213/251  mediastinal]
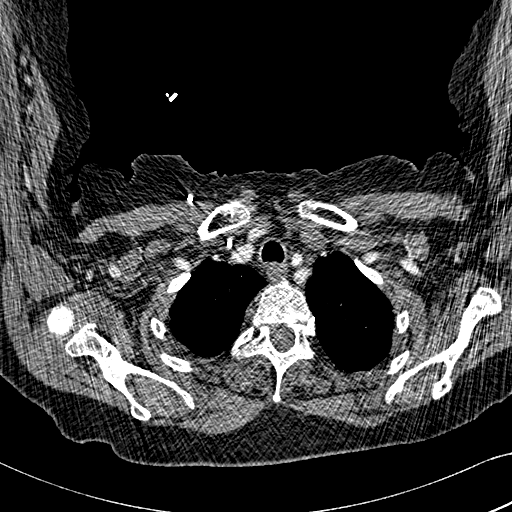
[im 226/251  lung]
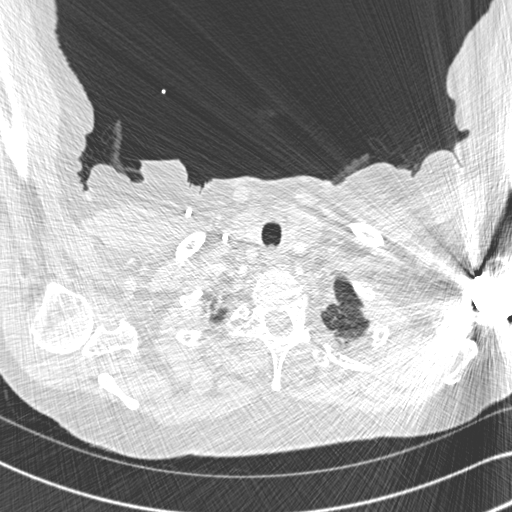
[im 238/251  mediastinal]
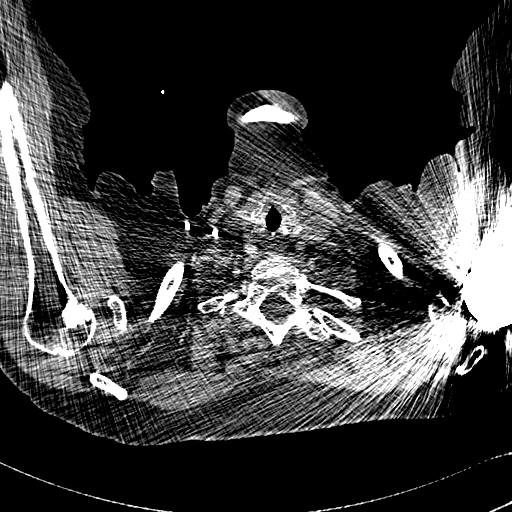

[Series 602: <mpr thick range> · coronal · 0.64mm/px · 1 of 110 slices shown]
[im 55/110  mediastinal]
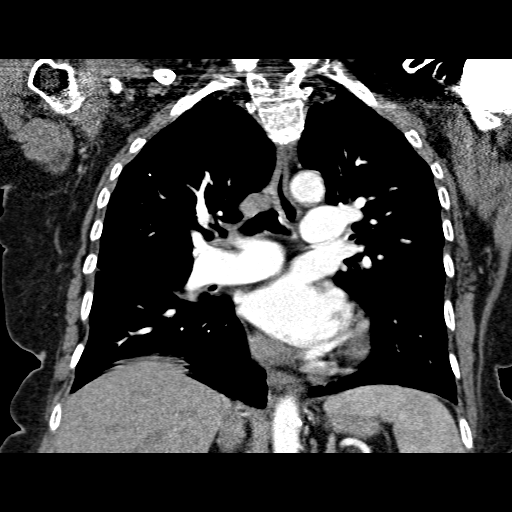

[19 of 36 positions shown; findings below may reference images not displayed]

FINDINGS: No evidence of pulmonary embolism.

High density material scattered throughout the lungs, for example
linear high density within a subsegmental right lower lobe
pulmonary artery (series 7/image 57), likely reflecting embolized
vertebroplasty cement, unchanged.

Patchy opacity in the lateral right upper lobe, compatible with
pneumonia.  Minimal patchy opacity in the lingula, likely
atelectasis or scarring.  Small right pleural effusion.  No
pneumothorax.

Visualized thyroid is unremarkable.

The heart is normal in size.  No pericardial effusion.  Mild
coronary atherosclerosis.  Atherosclerotic calcifications of the
aortic arch.

Right chest port.

No suspicious mediastinal, hilar, or axillary lymphadenopathy.

Visualized upper abdomen is unremarkable.

Prior vertebral augmentation at T7, T11, and T12.   Moderate to
severe compression deformity at L1, incompletely visualized.  Left
shoulder arthroplasty.  Stable benign-appearing sclerotic lesion in
the right humeral head (series 4/image 5).
IMPRESSION: No evidence of pulmonary embolism.

Right upper lobe pneumonia.  Small right pleural effusion.

Additional stable ancillary findings as above.

## 2013-06-27 IMAGING — DX DG CHEST 1V PORT
1 series · 1 of 1 positions shown · non-contrast
Comparison: 12/19/2011

CLINICAL DATA: Chest pain.  Short of breath.

PORTABLE CHEST - 1 VIEW

[AP]
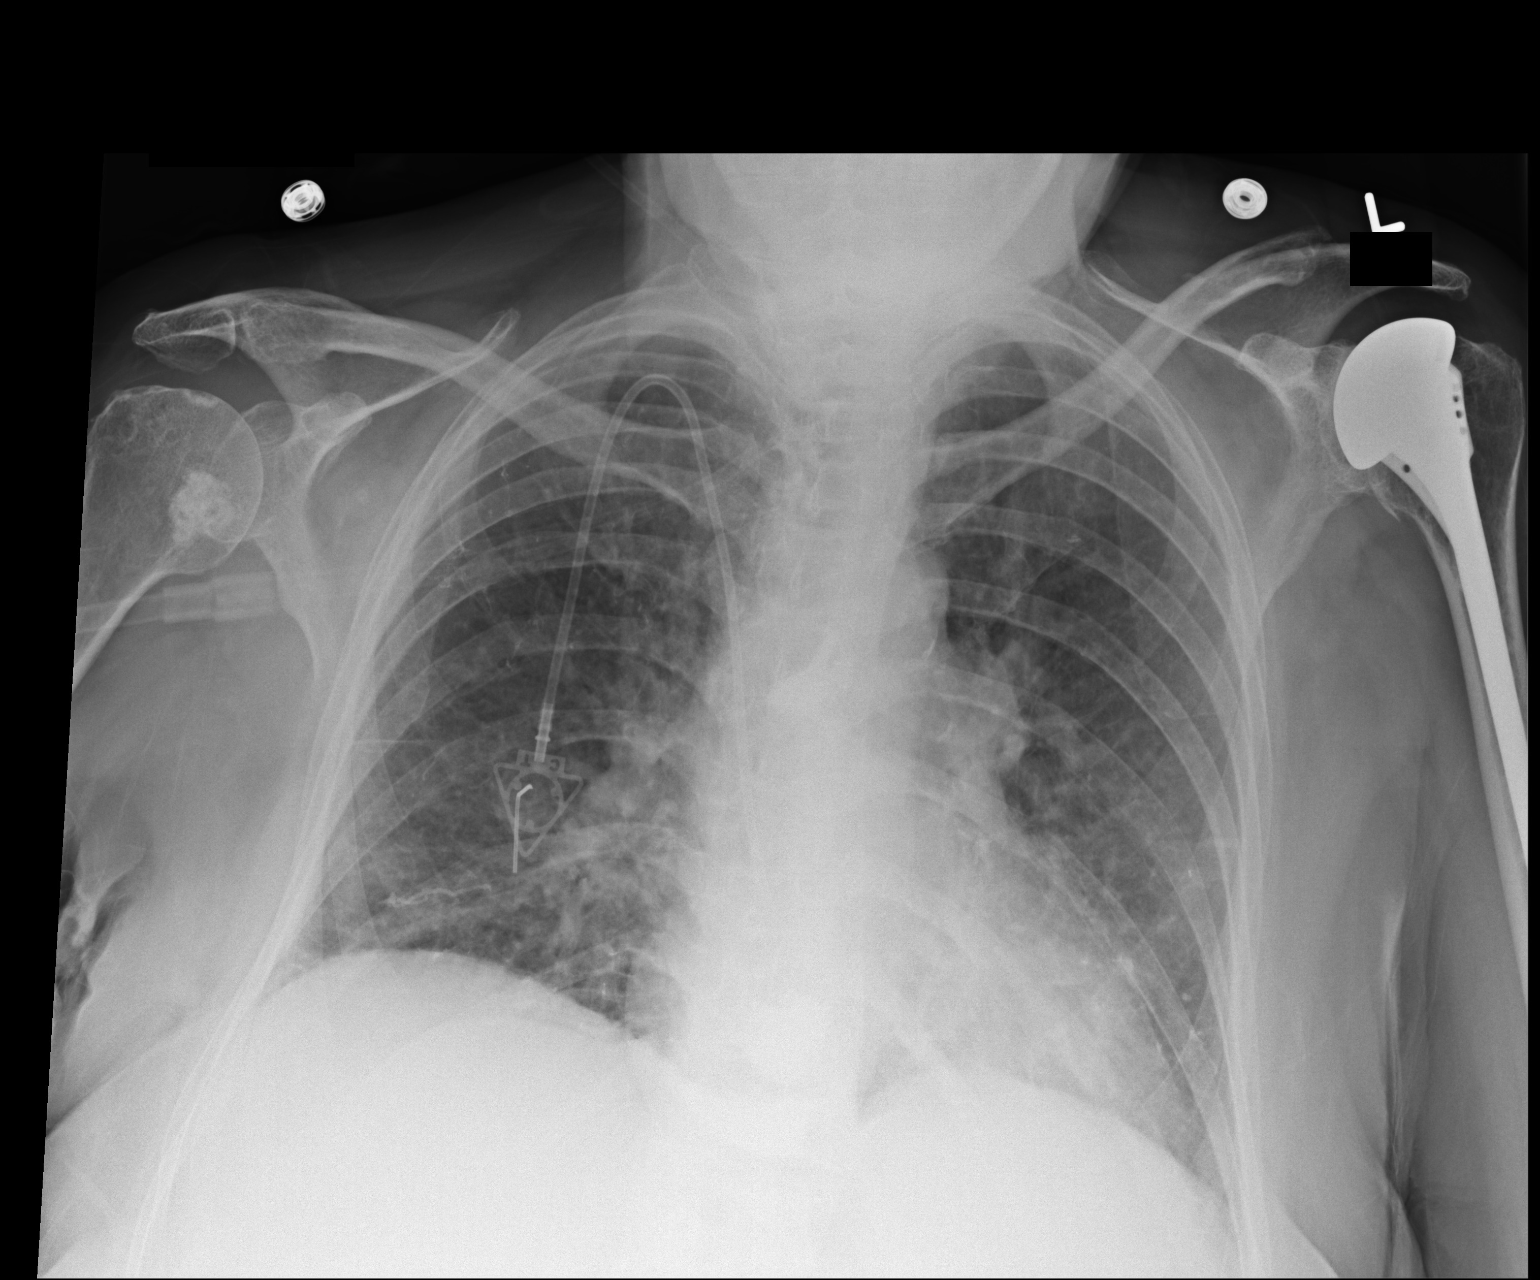

[1 of 1 positions shown; findings below may reference images not displayed]

FINDINGS: Right internal jugular vein Port-A-Cath stable.  No
pneumothorax.  Normal heart size.  Low lung volumes with mild
basilar atelectasis.  No definite interstitial edema.
IMPRESSION: No active cardiopulmonary disease.

## 2014-06-02 ENCOUNTER — Other Ambulatory Visit: Payer: Self-pay | Admitting: Pharmacist

## 2022-04-21 NOTE — Addendum Note (Signed)
Addended by: Earlie Counts on: 04/21/2022 02:34 PM   Modules accepted: Orders
